# Patient Record
Sex: Male | Born: 1945 | Race: White | Hispanic: No | Marital: Married | Smoking: Former smoker
Health system: Southern US, Community
[De-identification: ages and names within clinical notes are randomized; demographics above are authoritative.]

## PROBLEM LIST (undated history)

## (undated) DIAGNOSIS — Z87442 Personal history of urinary calculi: Secondary | ICD-10-CM

## (undated) DIAGNOSIS — R079 Chest pain, unspecified: Secondary | ICD-10-CM

## (undated) DIAGNOSIS — G459 Transient cerebral ischemic attack, unspecified: Secondary | ICD-10-CM

## (undated) DIAGNOSIS — I6529 Occlusion and stenosis of unspecified carotid artery: Secondary | ICD-10-CM

## (undated) DIAGNOSIS — R0602 Shortness of breath: Secondary | ICD-10-CM

## (undated) DIAGNOSIS — Z8679 Personal history of other diseases of the circulatory system: Secondary | ICD-10-CM

## (undated) DIAGNOSIS — I25810 Atherosclerosis of coronary artery bypass graft(s) without angina pectoris: Secondary | ICD-10-CM

## (undated) DIAGNOSIS — K219 Gastro-esophageal reflux disease without esophagitis: Secondary | ICD-10-CM

## (undated) DIAGNOSIS — M1990 Unspecified osteoarthritis, unspecified site: Secondary | ICD-10-CM

## (undated) DIAGNOSIS — H9190 Unspecified hearing loss, unspecified ear: Secondary | ICD-10-CM

## (undated) DIAGNOSIS — N189 Chronic kidney disease, unspecified: Secondary | ICD-10-CM

## (undated) DIAGNOSIS — G5600 Carpal tunnel syndrome, unspecified upper limb: Secondary | ICD-10-CM

## (undated) DIAGNOSIS — I2510 Atherosclerotic heart disease of native coronary artery without angina pectoris: Secondary | ICD-10-CM

## (undated) DIAGNOSIS — E785 Hyperlipidemia, unspecified: Secondary | ICD-10-CM

## (undated) DIAGNOSIS — I10 Essential (primary) hypertension: Secondary | ICD-10-CM

## (undated) DIAGNOSIS — I639 Cerebral infarction, unspecified: Secondary | ICD-10-CM

## (undated) HISTORY — DX: Atherosclerosis of coronary artery bypass graft(s) without angina pectoris: I25.810

## (undated) HISTORY — DX: Carpal tunnel syndrome, unspecified upper limb: G56.00

## (undated) HISTORY — DX: Transient cerebral ischemic attack, unspecified: G45.9

## (undated) HISTORY — DX: Hyperlipidemia, unspecified: E78.5

## (undated) HISTORY — DX: Occlusion and stenosis of unspecified carotid artery: I65.29

## (undated) HISTORY — PX: CORONARY ARTERY BYPASS GRAFT: SHX141

## (undated) HISTORY — PX: SHOULDER ARTHROSCOPY W/ ROTATOR CUFF REPAIR: SHX2400

## (undated) HISTORY — PX: CARPAL TUNNEL RELEASE: SHX101

## (undated) HISTORY — PX: NECK SURGERY: SHX720

## (undated) HISTORY — DX: Personal history of other diseases of the circulatory system: Z86.79

## (undated) HISTORY — PX: BACK SURGERY: SHX140

## (undated) HISTORY — PX: CARDIAC CATHETERIZATION: SHX172

## (undated) HISTORY — DX: Chest pain, unspecified: R07.9

---

## 1999-02-11 ENCOUNTER — Encounter: Payer: Self-pay | Admitting: Cardiology

## 1999-02-11 ENCOUNTER — Inpatient Hospital Stay (HOSPITAL_COMMUNITY): Admission: AD | Admit: 1999-02-11 | Discharge: 1999-02-16 | Payer: Self-pay | Admitting: Cardiology

## 1999-02-12 ENCOUNTER — Encounter: Payer: Self-pay | Admitting: Cardiology

## 1999-02-13 ENCOUNTER — Encounter: Payer: Self-pay | Admitting: Cardiology

## 1999-02-14 ENCOUNTER — Encounter: Payer: Self-pay | Admitting: Thoracic Surgery (Cardiothoracic Vascular Surgery)

## 1999-10-17 ENCOUNTER — Encounter: Payer: Self-pay | Admitting: Neurology

## 1999-10-17 ENCOUNTER — Ambulatory Visit (HOSPITAL_COMMUNITY): Admission: RE | Admit: 1999-10-17 | Discharge: 1999-10-17 | Payer: Self-pay | Admitting: Neurology

## 2001-01-01 ENCOUNTER — Ambulatory Visit (HOSPITAL_COMMUNITY): Admission: RE | Admit: 2001-01-01 | Discharge: 2001-01-01 | Payer: Self-pay | Admitting: General Surgery

## 2001-11-27 ENCOUNTER — Encounter: Payer: Self-pay | Admitting: Family Medicine

## 2001-11-27 ENCOUNTER — Ambulatory Visit (HOSPITAL_COMMUNITY): Admission: RE | Admit: 2001-11-27 | Discharge: 2001-11-27 | Payer: Self-pay | Admitting: Family Medicine

## 2002-05-03 ENCOUNTER — Ambulatory Visit (HOSPITAL_COMMUNITY): Admission: RE | Admit: 2002-05-03 | Discharge: 2002-05-03 | Payer: Self-pay | Admitting: Cardiology

## 2002-05-03 ENCOUNTER — Encounter: Payer: Self-pay | Admitting: Cardiology

## 2002-05-03 ENCOUNTER — Encounter (HOSPITAL_COMMUNITY): Admission: RE | Admit: 2002-05-03 | Discharge: 2002-06-02 | Payer: Self-pay | Admitting: Cardiology

## 2002-12-16 ENCOUNTER — Encounter: Payer: Self-pay | Admitting: Family Medicine

## 2002-12-16 ENCOUNTER — Ambulatory Visit (HOSPITAL_COMMUNITY): Admission: RE | Admit: 2002-12-16 | Discharge: 2002-12-16 | Payer: Self-pay | Admitting: Family Medicine

## 2003-05-26 ENCOUNTER — Ambulatory Visit (HOSPITAL_COMMUNITY): Admission: RE | Admit: 2003-05-26 | Discharge: 2003-05-26 | Payer: Self-pay | Admitting: Cardiology

## 2004-12-07 ENCOUNTER — Ambulatory Visit (HOSPITAL_COMMUNITY): Admission: RE | Admit: 2004-12-07 | Discharge: 2004-12-07 | Payer: Self-pay | Admitting: Family Medicine

## 2005-03-22 ENCOUNTER — Ambulatory Visit: Payer: Self-pay | Admitting: Cardiology

## 2005-04-08 ENCOUNTER — Ambulatory Visit: Payer: Self-pay

## 2005-12-13 ENCOUNTER — Ambulatory Visit (HOSPITAL_COMMUNITY): Admission: RE | Admit: 2005-12-13 | Discharge: 2005-12-13 | Payer: Self-pay | Admitting: Family Medicine

## 2006-02-04 ENCOUNTER — Ambulatory Visit: Payer: Self-pay | Admitting: Internal Medicine

## 2006-02-04 ENCOUNTER — Observation Stay (HOSPITAL_COMMUNITY): Admission: EM | Admit: 2006-02-04 | Discharge: 2006-02-06 | Payer: Self-pay | Admitting: Emergency Medicine

## 2006-02-16 ENCOUNTER — Ambulatory Visit: Payer: Self-pay | Admitting: Cardiology

## 2006-03-10 ENCOUNTER — Ambulatory Visit: Payer: Self-pay

## 2006-03-20 ENCOUNTER — Ambulatory Visit: Payer: Self-pay | Admitting: Cardiology

## 2006-03-29 ENCOUNTER — Ambulatory Visit: Payer: Self-pay

## 2007-02-20 ENCOUNTER — Ambulatory Visit (HOSPITAL_COMMUNITY): Admission: RE | Admit: 2007-02-20 | Discharge: 2007-02-20 | Payer: Self-pay | Admitting: General Surgery

## 2007-03-06 ENCOUNTER — Ambulatory Visit: Payer: Self-pay | Admitting: Cardiology

## 2008-01-08 ENCOUNTER — Ambulatory Visit (HOSPITAL_COMMUNITY): Admission: RE | Admit: 2008-01-08 | Discharge: 2008-01-08 | Payer: Self-pay | Admitting: Family Medicine

## 2008-02-13 ENCOUNTER — Ambulatory Visit: Payer: Self-pay | Admitting: Orthopedic Surgery

## 2008-02-13 DIAGNOSIS — G5600 Carpal tunnel syndrome, unspecified upper limb: Secondary | ICD-10-CM

## 2008-02-19 ENCOUNTER — Ambulatory Visit: Payer: Self-pay | Admitting: Cardiology

## 2008-02-21 ENCOUNTER — Ambulatory Visit: Payer: Self-pay

## 2008-02-21 ENCOUNTER — Encounter: Payer: Self-pay | Admitting: Orthopedic Surgery

## 2008-02-28 ENCOUNTER — Telehealth: Payer: Self-pay | Admitting: Orthopedic Surgery

## 2008-02-29 ENCOUNTER — Encounter: Payer: Self-pay | Admitting: Orthopedic Surgery

## 2008-03-04 ENCOUNTER — Encounter: Payer: Self-pay | Admitting: Orthopedic Surgery

## 2008-03-11 ENCOUNTER — Ambulatory Visit: Payer: Self-pay | Admitting: Orthopedic Surgery

## 2008-03-14 ENCOUNTER — Encounter: Payer: Self-pay | Admitting: Orthopedic Surgery

## 2008-03-21 ENCOUNTER — Ambulatory Visit: Payer: Self-pay | Admitting: Orthopedic Surgery

## 2008-03-21 ENCOUNTER — Ambulatory Visit (HOSPITAL_COMMUNITY): Admission: RE | Admit: 2008-03-21 | Discharge: 2008-03-21 | Payer: Self-pay | Admitting: Orthopedic Surgery

## 2008-03-25 ENCOUNTER — Ambulatory Visit: Payer: Self-pay | Admitting: Orthopedic Surgery

## 2008-04-02 ENCOUNTER — Ambulatory Visit: Payer: Self-pay | Admitting: Orthopedic Surgery

## 2008-04-16 ENCOUNTER — Ambulatory Visit: Payer: Self-pay | Admitting: Orthopedic Surgery

## 2008-12-26 ENCOUNTER — Encounter (INDEPENDENT_AMBULATORY_CARE_PROVIDER_SITE_OTHER): Payer: Self-pay | Admitting: *Deleted

## 2009-02-19 DIAGNOSIS — E785 Hyperlipidemia, unspecified: Secondary | ICD-10-CM

## 2009-02-19 DIAGNOSIS — I25810 Atherosclerosis of coronary artery bypass graft(s) without angina pectoris: Secondary | ICD-10-CM | POA: Insufficient documentation

## 2009-02-19 DIAGNOSIS — R079 Chest pain, unspecified: Secondary | ICD-10-CM | POA: Insufficient documentation

## 2009-02-19 DIAGNOSIS — Z8679 Personal history of other diseases of the circulatory system: Secondary | ICD-10-CM | POA: Insufficient documentation

## 2009-02-25 ENCOUNTER — Ambulatory Visit: Payer: Self-pay | Admitting: Cardiology

## 2009-02-25 ENCOUNTER — Ambulatory Visit: Payer: Self-pay

## 2009-08-25 ENCOUNTER — Encounter: Payer: Self-pay | Admitting: Cardiology

## 2009-08-26 ENCOUNTER — Ambulatory Visit: Payer: Self-pay

## 2009-08-26 ENCOUNTER — Encounter: Payer: Self-pay | Admitting: Cardiology

## 2009-12-07 ENCOUNTER — Ambulatory Visit (HOSPITAL_COMMUNITY): Admission: RE | Admit: 2009-12-07 | Discharge: 2009-12-07 | Payer: Self-pay | Admitting: Family Medicine

## 2010-02-15 ENCOUNTER — Ambulatory Visit: Payer: Self-pay | Admitting: Cardiology

## 2010-02-16 ENCOUNTER — Telehealth (INDEPENDENT_AMBULATORY_CARE_PROVIDER_SITE_OTHER): Payer: Self-pay | Admitting: *Deleted

## 2010-02-17 ENCOUNTER — Encounter (HOSPITAL_COMMUNITY): Admission: RE | Admit: 2010-02-17 | Discharge: 2010-04-30 | Payer: Self-pay | Admitting: Cardiology

## 2010-02-17 ENCOUNTER — Encounter: Payer: Self-pay | Admitting: Internal Medicine

## 2010-02-17 ENCOUNTER — Ambulatory Visit: Payer: Self-pay

## 2010-02-17 ENCOUNTER — Ambulatory Visit: Payer: Self-pay | Admitting: Internal Medicine

## 2010-06-29 NOTE — Progress Notes (Signed)
Summary: nuc pre procedure  Phone Note Outgoing Call Call back at Home Phone [phone]   Call placed by: Cathlyn Parsons RN,  February 16, 2010 4:00 PM Call placed to: Patient Reason for Call: Confirm/change Appt Summary of Call: Reviewed information on Myoview Information Sheet (see scanned document for further details).  Spoke with patients wife.      Nuclear Med Background Indications for Stress Test: Evaluation for Ischemia, Graft Patency   History: Asthma, CABG, COPD, Heart Catheterization, Myocardial Infarction  History Comments: 00 CABG x5 07 Cath EF:nl,LVF:nl,patent grafts 09 MPS:NL and no isch with EF of 53%  Symptoms: Chest Pain, Chest Tightness with Exertion, SOB    Nuclear Pre-Procedure Cardiac Risk Factors: Carotid Disease, Hypertension, Lipids, PVD, Smoker, TIA Height (in): 65

## 2010-06-29 NOTE — Miscellaneous (Signed)
Summary: Orders Update  Clinical Lists Changes  Orders: Added new Test order of Carotid Duplex (Carotid Duplex) - Signed 

## 2010-06-29 NOTE — Assessment & Plan Note (Signed)
Summary: Cardiology Nuclear Testing  Nuclear Med Background Indications for Stress Test: Evaluation for Ischemia, Graft Patency   History: Asthma, CABG, COPD, Heart Catheterization, Myocardial Infarction  History Comments: 00 CABG x5 07 Cath EF:nl,LVF:nl,patent grafts 09 MPS:NL and no isch with EF of 53%  Symptoms: Chest Pain, Chest Tightness with Exertion, DOE, Near Syncope, Rapid HR, SOB  Symptoms Comments: Last CP last week.   Nuclear Pre-Procedure Cardiac Risk Factors: Carotid Disease, Family History - CAD, Hypertension, Lipids, PVD, Smoker, TIA Caffeine/Decaff Intake: None NPO After: 8:00 AM Lungs: Diminished breath sounds, (-) wheezing IV 0.9% NS with Angio Cath: 20g     IV Site: R Antecubital IV Started by: Irean Hong, RN Chest Size (in) 38     Height (in): 65 Weight (lb): 122 BMI: 20.38  Nuclear Med Study 1 or 2 day study:  1 day     Stress Test Type:  Eugenie Birks Reading MD:  Arvilla Meres, MD     Referring MD:  T. Wall Resting Radionuclide:  Technetium 33m Tetrofosmin     Resting Radionuclide Dose:  11 mCi  Stress Radionuclide:  Technetium 63m Tetrofosmin     Stress Radionuclide Dose:  33 mCi   Stress Protocol      Max HR:  86 bpm     Predicted Max HR:  157 bpm  Max Systolic BP: 158 mm Hg     Percent Max HR:  54.78 %Rate Pressure Product:  16109  Lexiscan: 0.4 mg   Stress Test Technologist:  Irean Hong,  RN     Nuclear Technologist:  Domenic Polite, CNMT  Rest Procedure  Myocardial perfusion imaging was performed at rest 45 minutes following the intravenous administration of Technetium 77m Tetrofosmin.  Stress Procedure  The patient received IV Lexiscan 0.4 mg over 15-seconds.  Technetium 63m Tetrofosmin injected at 30-seconds.  There were nonspecific T-wave changes with lexiscan, rare PVC.  Quantitative spect images were obtained after a 45 minute delay.  QPS Raw Data Images:  Normal; no motion artifact; normal heart/lung ratio. Stress Images:   Decreased uptake in the septum. Apical thinning Rest Images:  Decreased uptake in the septum. 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 Apical thinning. No ischemia. Transient Ischemic Dilatation:  .99  (Normal <1.22)  Lung/Heart Ratio:  .32  (Normal <0.45)  Quantitative Gated Spect Images QGS EDV:  104 ml QGS ESV:  47 ml QGS EF:  55 % QGS cine images:  Septal hypokinesis/dyskinesis due to previous infarct vs previous CABG  Findings Low risk nuclear study      Overall Impression  Exercise Capacity: Lexiscan with no exercise. ECG Impression: No significant ST segment change suggestive of ischemia with Lexiscan. Overall Impression: Low risk stress nuclear study. Overall Impression Comments: Fixed septal defect suggestive of previous infarct (vs post-CABG changes). Apical thinning. No ischemia.  Appended Document: Cardiology Nuclear Testing stable, no change in treatment  Appended Document: Cardiology Nuclear Testing Pt is aware of test results. Mylo Red RN

## 2010-06-29 NOTE — Assessment & Plan Note (Signed)
Summary: per check out/sg   Visit Type:  1 yr f/u Primary Devin [name]:  Devin [name]  CC:  chest pain....sob....denies any edema.  History of Present Illness: [name] returns today for evaluation and management of his coronary artery disease and nonobstructive carotid disease.  Bypass surgery was about 11 years ago. His last catheterization was in 2007 which showed patent grafts and small vessel disease. He has good left ventricular systolic function.  Recently, he had increased shortness of breath and chest tightness at work. He works in [employer]. He has not taken a nitroglycerin. He says he needs this renewed.  Current Medications (verified): 1)  Plavix 75 Mg Tabs (Clopidogrel Bisulfate) .Marland Kitchen.. 1 Tab Once Daily 2)  Xalatan 0.005 % Soln (Latanoprost) .... Use As Directed 3)  Simvastatin 40 Mg Tabs (Simvastatin) .Marland Kitchen.. 1 Tab At Bedtime 4)  Aspirin 81 Mg Tbec (Aspirin) .... Take One Tablet By Mouth Daily 5)  Prilosec Otc 20 Mg Tbec (Omeprazole Magnesium) .Marland Kitchen.. 1 Tab Once Daily 6)  Cosopt 22.3-6.8 Mg/ml Soln (Dorzolamide Hcl-Timolol Mal) .Marland Kitchen.. 1 Gtt As Directed  Allergies: 1)  ! * Uncoated Aspirin  Clinical Reports Reviewed:  Cardiac Cath:  02/06/2006: Cardiac Cath Findings:  A left ventriculogram was performed in the 30 degree RAO projection.  It  demonstrated normal left ventricular function with a left ventricular  ejection fraction estimated at 60%.   ASSESSMENT:  1. Severe native three vessel coronary artery disease.  2. 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  3. Small distal coronary arteries.  4. Normal left ventricular function and normal left ventricular filling      pressures.   Findings discussed with Dr. Daleen Squibb.  Will plan on continued medical therapy.  Will start him on a Statin medication in the setting of his coronary artery  disease.  Will plan on discharging him home later today after his  observation period is complete.   Veverly Fells. Excell Seltzer, MD  Electronically Signed  02/11/1999: Cardiac Cath Findings:  Impression: 1. Normal left ventricular systolic function. 2. Significant 3-vessel coronary artery disease as described. The most crtical disease is in the left circumflex and right coronary system. Neither of these appear favorable for percutaneous intervention due to the diffuse nature of the disease.  Recommendation: Evaluation for CABG.  Daisey Must, MD  Carotid Doppler:  08/26/2009:  Impressions: Mild bilateral carotid artery disease, stable over serial exams. 40-59% RICA stenosis 0-39% LICA stenosis.  Tonny Bollman, MD  02/25/2009:  Impressions: Stable, moderate carotid artery disease on the right. Stable, mild carotid artery disease on the left. 60-79% RICA stenosis 0-39% LICA stenosis.  Tonny Bollman, MD  02/21/2008:  Impressions: Mild plaque in the carotid bulbs. Slight progression of RICA stenosis, now in the 60-79% range, low end of scale. 0-39% LICA stenosis.  Tonny Bollman, MD  03/29/2006:  Impressions: Stable bilateral carotid disease. 40-59% RICA stenosis 0-39% LICA stenosis.  Randa Evens, MD  05/26/2003:   Clinical Data:  Right carotid stenosis, follow-up.   ULTRASOUND CAROTID DUPLEX BILATERAL   Comparison 05/03/02.   Plaque formation is identified at the proximal right internal carotid   artery, less severe at the proximal internal and external carotid   arteries on the left and in the right common carotid artery.  Mild   intimal thickening, right common carotid artery.  Minimal tortuosity   of the carotid vessels.  On color Doppler imaging, mild turbulence of   flow is identified in  proximal right internal carotid artery.  Peak   systolic velocities and end diastolic velocities are as follows   (cm/second):                                                CCA   ICA                      ECA                    ICA/CCA RATIO   ICA EDV   RIGHT                               106                       147   108                       1.39                                    41   LEFT                                  74                         74   196                       1.00   Antegrade flow present bilateral vertebral arteries.   IMPRESSION   Scattered plaque formation bilaterally, most prominent at proximal   right internal carotid artery.  Elevated peak systolic velocity and   end diastolic velocity in the proximal right internal carotid artery   correspond to 50 to 69% luminal diameter stenosis.  Peak velocity   obtained on the current study is unchanged since the previous study.    Read By:  Lollie Marrow,  M.D.   Released By:  Lollie Marrow,  M.D.   Review of Systems       negative other than history of present illness  Vital Signs:  Patient profile:   65 year old male Height:      65 inches Weight:      120 pounds BMI:     20.04 Pulse rate:   49 / minute Pulse rhythm:   irregular BP sitting:   126 / 80  (left arm) Cuff size:   large  Vitals Entered By: Danielle Rankin, CMA (February 15, 2010 10:30 AM)  Physical Exam  General:  thin, no acute distress Head:  normocephalic and atraumatic Eyes:  PERRLA/EOM intact; conjunctiva and lids normal. Neck:  Neck supple, no JVD. No masses, thyromegaly or abnormal cervical nodes. Chest Wall:  no deformities or breast masses noted Lungs:  decreased breath sounds throughout Heart:  PMI nondisplaced, normal S1-S2, regular rate and rhythm, no gallop, carotids are full without significant bruit Abdomen:  soft, positive bowel sounds, no midline bruit Msk:  decreased ROM.   Pulses:  pulses normal in all 4  extremities Extremities:  No clubbing or cyanosis. Neurologic:  Alert and oriented x 3. Skin:  Intact without lesions or rashes. Psych:  Normal affect.   EKG  Procedure date:  02/15/2010  Findings:      marked sinus bradycardia, incomplete right bundle, no change  Impression &  Recommendations:  Problem # 1:  CAD, ARTERY BYPASS GRAFT (ICD-414.04)  His dyspnea and chest pain at work are concerning for coronary ischemia. Risk stratify with a nuclear stress study. His updated medication list for this problem includes:    Plavix 75 Mg Tabs (Clopidogrel bisulfate) .Marland Kitchen... 1 tab once daily    Aspirin 81 Mg Tbec (Aspirin) .Marland Kitchen... Take one tablet by mouth daily    Nitrostat 0.4 Mg Subl (Nitroglycerin) .Marland Kitchen... 1 tablet under tongue at onset of chest pain; you may repeat every 5 minutes for up to 3 doses.  Orders: EKG w/ Interpretation (93000) Nuclear Stress Test (Nuc Stress Test)  Problem # 2:  TRANSIENT ISCHEMIC ATTACKS, HX OF (ICD-V12.50) Assessment: Unchanged  Problem # 3:  CAROTID ARTERY DISEASE/ NONOBSTRUCTIVE (ICD-433.10) Assessment: Unchanged  His updated medication list for this problem includes:    Plavix 75 Mg Tabs (Clopidogrel bisulfate) .Marland Kitchen... 1 tab once daily    Aspirin 81 Mg Tbec (Aspirin) .Marland Kitchen... Take one tablet by mouth daily  Problem # 4:  HYPERLIPIDEMIA (ICD-272.4)  His updated medication list for this problem includes:    Simvastatin 40 Mg Tabs (Simvastatin) .Marland Kitchen... 1 tab at bedtime  Patient Instructions: 1)  Your physician recommends that you schedule a follow-up appointment in: 1 year with Dr. Daleen Squibb 2)  Your physician recommends that you continue on your current medications as directed. Please refer to the Current Medication list given to you today. 3)  Your physician has requested that you have a lexiscan myoview.  For further information please visit [email or web address]  Please follow instruction sheet, as given.  SCHEDULE THIS WEEK IF POSSIBLE. Prescriptions: NITROSTAT 0.4 MG SUBL (NITROGLYCERIN) 1 tablet under tongue at onset of chest pain; you may repeat every 5 minutes for up to 3 doses.  #25 x 11   Entered by:   Lisabeth Devoid RN   Authorized by:   Gaylord Shih, MD, [facility]   Signed by:   Lisabeth Devoid RN on 02/15/2010   Method used:   Electronically to         [facility]* (retail)       [address]       [place], [place]  [address]       Ph: [phone]       Fax: [phone]   RxID:   [id number]

## 2010-09-13 ENCOUNTER — Encounter: Payer: Self-pay | Admitting: Orthopedic Surgery

## 2010-09-28 ENCOUNTER — Encounter: Payer: Self-pay | Admitting: Orthopedic Surgery

## 2010-09-28 ENCOUNTER — Ambulatory Visit (HOSPITAL_COMMUNITY)
Admission: RE | Admit: 2010-09-28 | Discharge: 2010-09-28 | Disposition: A | Discharge status: Home / Self Care | Payer: BC Managed Care – PPO | Source: Ambulatory Visit | Attending: Orthopedic Surgery | Admitting: Orthopedic Surgery

## 2010-09-28 ENCOUNTER — Other Ambulatory Visit: Payer: Self-pay | Admitting: Orthopedic Surgery

## 2010-09-28 ENCOUNTER — Ambulatory Visit (INDEPENDENT_AMBULATORY_CARE_PROVIDER_SITE_OTHER): Payer: BC Managed Care – PPO | Admitting: Orthopedic Surgery

## 2010-09-28 VITALS — Resp 18 | Ht 64.0 in | Wt 126.0 lb

## 2010-09-28 DIAGNOSIS — M67919 Unspecified disorder of synovium and tendon, unspecified shoulder: Secondary | ICD-10-CM

## 2010-09-28 DIAGNOSIS — M899 Disorder of bone, unspecified: Secondary | ICD-10-CM | POA: Insufficient documentation

## 2010-09-28 DIAGNOSIS — M719 Bursopathy, unspecified: Secondary | ICD-10-CM

## 2010-09-28 DIAGNOSIS — M25519 Pain in unspecified shoulder: Secondary | ICD-10-CM | POA: Insufficient documentation

## 2010-09-28 DIAGNOSIS — M25511 Pain in right shoulder: Secondary | ICD-10-CM

## 2010-09-28 DIAGNOSIS — M7550 Bursitis of unspecified shoulder: Secondary | ICD-10-CM

## 2010-09-28 MED ORDER — TRAMADOL-ACETAMINOPHEN 37.5-325 MG PO TABS: 1.0000 | ORAL_TABLET | ORAL | Status: AC | PRN

## 2010-09-28 MED ORDER — METHYLPREDNISOLONE ACETATE 40 MG/ML IJ SUSP: 40.0000 mg | Freq: Once | INTRAMUSCULAR | Status: DC

## 2010-09-28 NOTE — Progress Notes (Signed)
Chief complaint RIGHT shoulder pain  65 year old male complains of sharp throbbing RIGHT shoulder pain of moderate severity 6/10 for the last 2-1/2 months with no history of trauma.  His pain came on gradually and it's worse when he is using his arm overhead and eases off when he is not using his arm except it does also hurt at night.  Review of systems musculoskeletal as stated neurologic normal.  History medical problems he listed none but he takes Plavix a coated aspirin and another medication which is listed.  He had carpal tunnel releases which I did he also had cervical disc surgery and he has heart disease because he had a bypass.  He works at a [employer] and he frequently uses his arms overhead.  Vital signs are stable recorded data.  General appearance he is a small framed.  He is going to x3.  His mood and affect are normal.  He ambulates normally.    RIGHT shoulder exam inspection tenderness over the subacromial space an anterolateral deltoid.  He has restricted range of motion in flexion abduction and external rotation.  However the shoulder remained stable.  He has mild weakness in the supraspinatus tendon.  Skin is normal.  Pulses excellent temperature is normal.  There is no lymph node enlargement and sensation was normal and the RIGHT upper extremity.  He did have a positive impingement sign.

## 2010-09-28 NOTE — Patient Instructions (Signed)
Going to [facility] for [employer] will call the result to you   You have received a steroid shot. 15% of patients experience increased pain at the injection site with in the next 24 hours. This is best treated with ice and tylenol extra strength 2 tabs every 8 hours. If you are still having pain please call the office.

## 2010-10-12 NOTE — H&P (Signed)
NAME:  Devin [name], Devin [name]            ACCOUNT NO.:  [id number]   MEDICAL RECORD NO.:  [id number]           PATIENT TYPE:  AMB   LOCATION:  DAY                           FACILITY:  [facility]   PHYSICIAN:  Mark A. Jenkins, M.D.  DATE OF BIRTH:  08/15/1945   DATE OF ADMISSION:  DATE OF DISCHARGE:  LH                              HISTORY & PHYSICAL   CHIEF COMPLAINT:  Hematochezia.   HISTORY OF PRESENT ILLNESS:  The patient is a 65-year-old white male who  is referred for endoscopic evaluation. He needs a colonoscopy for  hematochezia.  He was noted on hemoccult card testing to be positive.  No abdominal pain, weight loss, nausea, vomiting, diarrhea,  constipation, or melena had been noted.  He last had a colonoscopy in  2002 which was unremarkable.  There is no family history of colon  carcinoma.   PAST MEDICAL HISTORY:  Coronary artery disease, high cholesterol levels.   PAST SURGICAL HISTORY:  CABG, back surgery.   CURRENT MEDICATIONS:  Plavix, Prilosec.   ALLERGIES:  Regular ASPIRIN.   REVIEW OF SYSTEMS:  The patient smokes a pack of cigarettes a day.  He  denies any alcohol use.  He denies any recent chest pain, shortness of  breath, leg swelling, CVA, or diabetes mellitus.   PHYSICAL EXAMINATION:  The patient is a well-developed, well-nourished,  white male in no acute distress.  LUNGS:  Clear to auscultation with equal breath sounds bilaterally.  HEART:  Examination reveals a regular rate and rhythm without S3, S4, or  murmurs.  ABDOMEN:  Soft, nontender, nondistended.  No hepatosplenomegaly or  masses are noted.  RECTAL:  Examination was deferred to the procedure.   IMPRESSION:  Hematochezia.   PLAN:  The patient is scheduled for a colonoscopy on February 20, 2007.  The risks and benefits of the procedure including bleeding and  perforation were fully explained to the patient, who gave informed  consent. He was told to hold his Plavix 5 days prior to the procedure.      Mark A. Jenkins, M.D.  Electronically Signed     MAJ/MEDQ  D:  01/23/2007  T:  01/24/2007  Job:  [id number]   cc:   Angus G. McInnis, MD  Fax: [phone] 

## 2010-10-12 NOTE — Assessment & Plan Note (Signed)
[facility]                            CARDIOLOGY OFFICE NOTE   NAME:Devin, [name]                   MRN:          [id number]  DATE:03/06/2007                            DOB:          06-28-1945    Mr. Devin [name] returns today for further management of the following  issues:   1. Coronary artery disease.  Catheterization in September 2007 showed      all grafts to be patent.  He has normal left ventricular function.  2. Nonobstructive carotid disease.  He has had a history of TIA's and      is on Plavix and aspirin.  He has small vessel disease.  Carotid      Dopplers October 7 were stable.  He has integrated flow in both      vertebrals.  3. Hyperlipidemia.  Dr. [name] recently checked blood work.  He was      at goal last year.   MEDICATIONS:  1. Prilosec 20 mg a day.  2. Plavix 75 mg a day.  3. Simvastatin 40 mg q.h.s.  4. Aspirin 81 mg a day.   PHYSICAL EXAMINATION:  GENERAL:  He is in no acute distress.  VITAL SIGNS:  Blood pressure 124/60, pulse 56 and regular.  His EKG is  normal except for some left atrial enlargement and RSR prime in V1 and  V2.  This is stable.  Weight 126.  HEENT:  Unchanged.  NECK:  Carotid upstrokes were equal bilaterally with a soft bruit on the  right.  CHEST:  Sternum is intact.  HEART:  He has a nondisplaced PMI.  He has a widely split S2.  LUNGS:  Reveal decreased breath sounds throughout.  NECK:  Shows no lymphadenopathy, no thyromegaly.  ABDOMEN:  Soft, good bowel sounds.  There is no midline bruit.  There is  no tenderness.  EXTREMITIES:  There is no cyanosis, clubbing or edema.  Pulses were  present.  NEUROLOGICAL:  Intact.   ASSESSMENT/PLAN:  Devin [name] is stable from our standpoint.  I have  made no change in his program. Will see him back in a year.     Thomas C. Daleen Squibb, MD, [facility]  Electronically Signed    TCW/MedQ  DD: 03/06/2007  DT: 03/06/2007  Job #: [id number]

## 2010-10-12 NOTE — H&P (Signed)
NAMEMIRL, Devin [name]            ACCOUNT NO.:  [id number]   MEDICAL RECORD NO.:  [id number]           PATIENT TYPE:  AMB   LOCATION:  DAY                           FACILITY:  [facility]   PHYSICIAN:  Dalia Heading, M.D.  DATE OF BIRTH:  1945/09/25   DATE OF ADMISSION:  DATE OF DISCHARGE:  LH                              HISTORY & PHYSICAL   CHIEF COMPLAINT:  Hematochezia.   HISTORY OF PRESENT ILLNESS:  The patient is a 65 year old white male who  is referred for endoscopic evaluation. He needs a colonoscopy for  hematochezia.  He was noted on hemoccult card testing to be positive.  No abdominal pain, weight loss, nausea, vomiting, diarrhea,  constipation, or melena had been noted.  He last had a colonoscopy in  2002 which was unremarkable.  There is no family history of colon  carcinoma.   PAST MEDICAL HISTORY:  Coronary artery disease, high cholesterol levels.   PAST SURGICAL HISTORY:  CABG, back surgery.   CURRENT MEDICATIONS:  Plavix, Prilosec.   ALLERGIES:  Regular ASPIRIN.   REVIEW OF SYSTEMS:  The patient smokes a pack of cigarettes a day.  He  denies any alcohol use.  He denies any recent chest pain, shortness of  breath, leg swelling, CVA, or diabetes mellitus.   PHYSICAL EXAMINATION:  The patient is a well-developed, well-nourished,  white male in no acute distress.  LUNGS:  Clear to auscultation with equal breath sounds bilaterally.  HEART:  Examination reveals a regular rate and rhythm without S3, S4, or  murmurs.  ABDOMEN:  Soft, nontender, nondistended.  No hepatosplenomegaly or  masses are noted.  RECTAL:  Examination was deferred to the procedure.   IMPRESSION:  Hematochezia.   PLAN:  The patient is scheduled for a colonoscopy on February 20, 2007.  The risks and benefits of the procedure including bleeding and  perforation were fully explained to the patient, who gave informed  consent. He was told to hold his Plavix 5 days prior to the procedure.      Dalia Heading, M.D.  Electronically Signed     MAJ/MEDQ  D:  01/23/2007  T:  01/24/2007  Job:  [id number]   cc:   Angus G. [name], MD  Fax: [phone]

## 2010-10-12 NOTE — H&P (Signed)
NAMEPJ, ZEHNER            ACCOUNT NO.:  [id number]   MEDICAL RECORD NO.:  [id number]           PATIENT TYPE:  AMB   LOCATION:  DAY                           FACILITY:  [facility]   PHYSICIAN:  Dalia Heading, M.D.  DATE OF BIRTH:  Jan 28, 1946   DATE OF ADMISSION:  DATE OF DISCHARGE:  LH                              HISTORY & PHYSICAL   CHIEF COMPLAINT:  Hematochezia.   HISTORY OF PRESENT ILLNESS:  The patient is a 65 year old white male who  is referred for endoscopic evaluation. He needs a colonoscopy for  hematochezia.  He was noted on hemoccult card testing to be positive.  No abdominal pain, weight loss, nausea, vomiting, diarrhea,  constipation, or melena had been noted.  He last had a colonoscopy in  2002 which was unremarkable.  There is no family history of colon  carcinoma.   PAST MEDICAL HISTORY:  Coronary artery disease, high cholesterol levels.   PAST SURGICAL HISTORY:  CABG, back surgery.   CURRENT MEDICATIONS:  Plavix, Prilosec.   ALLERGIES:  Regular ASPIRIN.   REVIEW OF SYSTEMS:  The patient smokes a pack of cigarettes a day.  He  denies any alcohol use.  He denies any recent chest pain, shortness of  breath, leg swelling, CVA, or diabetes mellitus.   PHYSICAL EXAMINATION:  The patient is a well-developed, well-nourished,  white male in no acute distress.  LUNGS:  Clear to auscultation with equal breath sounds bilaterally.  HEART:  Examination reveals a regular rate and rhythm without S3, S4, or  murmurs.  ABDOMEN:  Soft, nontender, nondistended.  No hepatosplenomegaly or  masses are noted.  RECTAL:  Examination was deferred to the procedure.   IMPRESSION:  Hematochezia.   PLAN:  The patient is scheduled for a colonoscopy on February 20, 2007.  The risks and benefits of the procedure including bleeding and  perforation were fully explained to the patient, who gave informed  consent. He was told to hold his Plavix 5 days prior to the procedure.      Dalia Heading, M.D.  Electronically Signed     MAJ/MEDQ  D:  01/23/2007  T:  01/24/2007  Job:  [id number]   cc:   Angus G. [name], MD  Fax: [phone]

## 2010-10-12 NOTE — Assessment & Plan Note (Signed)
[facility]                            CARDIOLOGY OFFICE NOTE   NAME:[name], Devin BELLEW                   MRN:          [id number]  DATE:02/19/2008                            DOB:          Aug 22, 1945    HISTORY OF PRESENT ILLNESS:  Devin [name] returns today for followup of  his coronary artery disease.  He has been under a lot of stress at work  with being afraid of the plant closing or losing more work time.  He has  also got a lot of family stresses going on.   He is 9 years out from bypass surgery.  He had a heart catheterization  in 2007, which showed all grafts to be patent.  He has normal left  ventricular function.   He has been having some chest pain at work when he is at work  physically.  He has not had this at rest.  He thinks a lot of his  tension is stress with activity.   He also has nonobstructive carotid disease.  He has had a history of  TIAs and he is on Plavix and aspirin.  He has had no further symptoms or  spells.  He has small vessel disease.  His carotid Dopplers stable in  October 2008.  He has antegrade flow in both vertebrals.   He is followed by Dr. Butch Penny in [place].  He has been  following his blood work and he says his hyperlipidemia is under good  control.   CURRENT MEDICATIONS:  1. Plavix 75 mg a day.  2. Xalatan eye drops.  3. Simvastatin 40 mg at bedtime.  4. Enteric-coated aspirin 81 mg a day.  5. Prilosec 20 mg a day.   PHYSICAL EXAMINATION:  VITAL SIGNS:  His blood pressure is 146/73 and  his heart rate is 59.  His electrocardiogram shows sinus bradycardia,  otherwise normal.  HEENT:  Normal.  NECK:  Carotids upstrokes were equal bilaterally without obvious bruits.  Thyroid is not enlarged.  Trachea is midline.  NECK:  Supple.  LUNGS:  Clear except decreased breath sounds throughout.  HEART:  Soft S1 and S2.  No gallop.  ABDOMEN:  Soft.  Good bowel sounds.  No midline bruit.  No  hepatomegaly.  EXTREMITIES:  No cyanosis, clubbing, or edema.  Pulses are intact.  NEURO:  Intact.   Devin [name] seems to be doing well except for his exertional chest  pain.  We will obtain an adenosine Myoview to rule out any high-risk  anatomy.  In addition, we will obtain carotid  Dopplers as well.  Assuming these are stable, hopefully will be, we will  see him back in a year.  If his stress test is abnormal, he will need a  heart catheterization.     Thomas C. Daleen Squibb, MD, [facility]  Electronically Signed    TCW/MedQ  DD: 02/19/2008  DT: 02/20/2008  Job #: [id number]   cc:   Angus G. [name], MD

## 2010-10-12 NOTE — Op Note (Signed)
Devin [name], Devin [name]            ACCOUNT NO.:  [id number]   MEDICAL RECORD NO.:  [id number]          PATIENT TYPE:  AMB   LOCATION:  DAY                           FACILITY:  [facility]   PHYSICIAN:  Vickki Hearing, M.D.DATE OF BIRTH:  11/19/1945   DATE OF PROCEDURE:  03/21/2008  DATE OF DISCHARGE:                               OPERATIVE REPORT   HISTORY:  This is a 65 year old male who has had carpal tunnel syndrome  for 9 years initially just would not have surgery, symptoms worsened  over the last 6 months to a year.  He was treated with Neurontin,  vitamin B6, and bracing.  He had a nerve conduction study, which showed  that he had bilateral carpal tunnel syndrome and we advised him to have  carpal tunnel release.  His test showed severe disease.   PREOPERATIVE DIAGNOSIS:  Bilateral carpal tunnel syndrome.   POSTOPERATIVE DIAGNOSIS:  Bilateral carpal tunnel syndrome.   PROCEDURE:  Open bilateral carpal tunnel release.   SURGEON:  Vickki Hearing, MD.   ASSISTANT:  No assistants.   ANESTHETIC:  General.   OPERATIVE FINDINGS:  Severe compression and discoloration of the median  nerve on both wrists and hand.   SPECIMENS:  No specimens.   ESTIMATED BLOOD LOSS:  ,  No blood loss.   COMPLICATIONS:  No complications.   COUNTS:  Correct.   The patient was stable at the end of procedure and went to PACU in good  condition.   POSTOPERATIVE PLAN:  Discharged on Lorcet Plus for pain 1 q.4 h. p.r.n.  #60 one refill and followup with [place] on Tuesday.   PROCEDURE DETAILS:  The patient was identified in the preop holding  area.  Both wrists were marked with my initials countersigned where he  marked.  History and physical were updated.  He was taken to the  operating room, given some Ancef, and general intubation was performed.   His right wrist was addressed first.   Sterile prep and drape was done initially with DuraPrep in sterile  draping technique.   Tourniquet  was elevated 150 mmHg where it stayed for 14 minutes.  An  incision was made in line with the radial border of the ring finger over  the carpal tunnel.  The subcutaneous tissue was divided.  Palmar fascia  was split sharply.  The distal portion or point of the carpal tunnel was  dissected with blunt dissection and then dissection was carried out  beneath the transverse carpal ligament and then the ligament was  released up through and passed the distal wrist crease.   The nerve was found to be discolored and compressed.  The wound was  irrigated and closed with 3-0 nylon suture and we injected 10 mL of  plain Marcaine on the radial sided incision, we applied sterile  dressing.  The tourniquet was released.  The fingers had good color.   We then repeated the procedure on the left side.  We did notice that  there was tortuous venous engorgement on the left, this was on the ulnar  side of the carpal tunnel,  and appeared to be having an effect on the  nerve as well.      Vickki Hearing, M.D.  Electronically Signed     SEH/MEDQ  D:  03/21/2008  T:  03/21/2008  Job:  [id number]

## 2010-10-15 NOTE — Discharge Summary (Signed)
Devin [name], SENFT            ACCOUNT NO.:  [id number]   MEDICAL RECORD NO.:  [id number]          PATIENT TYPE:  INP   LOCATION:  2028                         FACILITY:  [facility]   PHYSICIAN:  Duke Salvia, MD, FACCDATE OF BIRTH:  12-22-1945   DATE OF ADMISSION:  02/04/2006  DATE OF DISCHARGE:                                 DISCHARGE SUMMARY   ADDENDUM:  Please note that the attending physician is Dr. Hurman Horn.  He also interviewed and examined the patient.     ______________________________  Tereso Newcomer, PA-C    ______________________________  Duke Salvia, MD, [facility]    SW/MEDQ  D:  02/04/2006  T:  02/05/2006  Job:  [id number]

## 2010-10-15 NOTE — Assessment & Plan Note (Signed)
[facility]                              CARDIOLOGY OFFICE NOTE   NAME:Devin [name], Devin [name]                   MRN:          [id number]  DATE:03/20/2006                            DOB:          [birth year]    Devin [name] returns today for further management of the following issues:  1. Coronary artery disease. Recent catheterization in September showed      patent grafts x5. He has normal left ventricular function.  2. Non-obstructive carotid disease. He is due carotid Dopplers, last being      November 2006. He is asymptomatic.  3. Hyperlipidemia. His lipids are due a check since we started simvastatin      per his recollection in the hospital. He is on 40 q.h.s.   He also had some lower extremity numbness with walking. Lower extremity  Doppler were done on March 10, 2006, showed normal ABIs.   MEDICATIONS:  1. Prilosec 20 mg a day.  2. Plavix 75 mg a day.  3. Xalatan drops.  4. Vitamin C.  5. One-A-Day day vitamin.  6. Zinc.  7. Simvastatin 40 q. h.s.  8. Aspirin 81 a day.   His blood pressure is 135/68. Pulse: 54 and regular. His weight is 124.  Carotid upstrokes are equal bilaterally without bruits. There is no JVD.  Thyroid is not enlarged. Trachea is midline.  LUNGS:  Clear.  CHEST: Reveals a stable sternum. There is normal S1, S2.  ABDOMEN: Soft with good bowel sounds.  EXTREMITIES: Reveals no edema. Pulses were present.   ASSESSMENT/PLAN:  Devin [name] is doing well. He is due lipids and liver  function tests which we will obtain today. He is also due carotid Doppler in  November. Will attempt to schedule these.   Otherwise, I will see him back in a year.     Thomas C. Daleen Squibb, MD, [facility]   TCW/MedQ  DD: 03/20/2006  DT: 03/20/2006  Job #: [id number]   cc:   Angus G. [name], MD

## 2010-10-15 NOTE — Cardiovascular Report (Signed)
Devin [name], Devin [name]            ACCOUNT NO.:  [id number]   MEDICAL RECORD NO.:  [id number]          PATIENT TYPE:  INP   LOCATION:  2028                         FACILITY:  [facility]   PHYSICIAN:  Veverly Fells. Excell Seltzer, MD  DATE OF BIRTH:  02-09-1946   DATE OF PROCEDURE:  02/06/2006  DATE OF DISCHARGE:  02/06/2006                              CARDIAC CATHETERIZATION   PROCEDURE:  Left heart catheterization, selective coronary angiogram, left  ventricular angiogram, saphenous vein graft angiogram, LIMA angiogram and  StarClose of the right femoral artery.   INDICATIONS FOR PROCEDURE:  Devin [name] is a 65 year old male who presented  with a history of coronary artery disease and previous bypass surgery.  He  presented with unstable angina and was subsequently referred for cardiac  catheterization.   ACCESS:  Right femoral artery.   COMPLICATIONS:  None.   PROCEDURAL DETAILS:  Risks and indications  of the procedure were explained  in detail to the patient.  Informed consent was obtained.  The right groin  was prepped and draped in normal sterile fashion.  Using the modified  Seldinger technique, a 6 French right femoral arterial sheath was placed in  the right common femoral artery.  Diagnostic catheters included a 6 [place]  JL4, JR4 and angled pigtail catheter.  The JR4 was used for the vein graft  and LIMA injections.  At the conclusion of the case, the artery was closed  using a StarClose device.  All catheter exchanges were performed over a J-  tip guidewire.   FINDINGS:  Aortic pressure 121/52 with mean of 77.  Left ventricular  pressure 122/4 with an end diastolic pressure of 6.   NATIVE CORONARY ANATOMY:  The left main stem has a 90% distal stenosis.  It  bifurcates into the LAD and left circumflex.   The LAD has an ostial stenosis and is occluded in its proximal segment.   The left circumflex has severe proximal disease and is occluded in its mid  segment. It give off a  small branch obtuse marginal.   Right coronary artery is 100% occluded in its proximal segment.   BYPASS GRAFT ANATOMY:  1. Saphenous vein graft to the first diagonal is widely patent.  The first      diagonal is a small diameter artery but has no discrete stenoses.  2. Saphenous vein graft sequence to the first and second obtuse marginal      vessels is widely patent.  There is no stenosis in the vein graft or      the native obtuse marginals.  3. Saphenous vein graft to the distal right coronary artery is patent.      There is no angiographic disease in this vein graft.  The anastomotic      site is just proximal to the bifurcation of the PDA and posterior AV      segment, both of which fill from the graft.  There is a posterolateral      given off by the posterior AV segment.  4. The LIMA to LAD is widely patent.  The mid and distal LAD are of  small      diameter.  There were no discrete stenoses in the mid or distal LAD.   A left ventriculogram was performed in the 30 degree RAO projection.  It  demonstrated normal left ventricular function with a left ventricular  ejection fraction estimated at 60%.   ASSESSMENT:  1. Severe native three vessel coronary artery disease.  2. 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  3. Small distal coronary arteries.  4. Normal left ventricular function and normal left ventricular filling      pressures.   Findings discussed with Dr. Daleen Squibb.  Will plan on continued medical therapy.  Will start him on a Statin medication in the setting of his coronary artery  disease.  Will plan on discharging him home later today after his  observation period is complete.      Veverly Fells. Excell Seltzer, MD  Electronically Signed     MDC/MEDQ  D:  02/06/2006  T:  02/07/2006  Job:  [id number]   cc:   Thomas C. Wall, MD, [facility] [place]

## 2010-10-15 NOTE — Assessment & Plan Note (Signed)
[facility]                              CARDIOLOGY OFFICE NOTE   NAME:Devin [name], Devin [name]                   MRN:          [id number]  DATE:02/16/2006                            DOB:          12-05-1945    PRIMARY CARE PHYSICIAN:  Devin G. [name], MD.   PRIMARY CARDIOLOGIST:  Devin Sans. Wall, MD, [name].   HISTORY OF PRESENT ILLNESS:  Devin [name] is a very pleasant 65 year old  male patient with a history of coronary disease status post CABG in 2000 who  presented to [name] on February 04, 2006 with complaints of  chest pain. He was admitted for further evaluation. He underwent cardiac  catheterization February 06, 2006. This revealed patent grafts x5 and  normal LV function. (His grafts include a vein graft to the first diagonal,  sequential vein graft to the first and second obtuse marginal, vein graft to  the distal RCA and LIMA to LAD.) The patient was discharged home on the same  day of his catheterization and returns today for followup. He is doing well  since his hospitalization. He has had a couple of episodes of chest  discomfort. These are left-sided and he describes it as tightness. They last  a minute or less. They are not really associated with exertion. He has not  really noticed any since he went back work. 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 He denies any significant shortness of breath.  He denies any syncope, presyncope, orthopnea, or postnocturnal dyspnea. He  has noticed some transient edema in his lower extremities that seems to be  worse when he is on his feet but improves when he lays down. He denies any  pleuritic chest pain. Denies any odynophagia or dysphagia. Denies any  __________  symptoms or belching.   The patient did note some bilateral calf pain with ambulation that goes away  with rest. He is still noting that today.   CURRENT MEDICATIONS:  1. Prilosec 20 mg daily.  2. Plavix  75 mg daily.  3. Xalatan eye drops.  4. Vitamin C.  5. Multivitamin.  6. Zinc.  7. Simvastatin 40 mg q.h.s.  8. Aspirin 81 mg daily.   PHYSICAL EXAMINATION:  GENERAL:  He is a well-developed, well-nourished male  in no distress.  VITAL SIGNS:  Blood pressure is 138/72, pulse 61, weight 129 pounds.  HEENT:  Unremarkable.  NECK:  Without JVD.  CARDIOVASCULAR:  S1, S2, without murmurs, clicks, rubs or gallops.  LUNGS:  Clear to auscultation bilaterally without wheezing, rhonchi or  rales.  ABDOMEN:  Soft, nontender with normal active bowel sounds, no organomegaly.  EXTREMITIES:  Without edema. Calves are soft nontender. Multiple  varicosities noted around the ankle.  NEUROLOGIC:  He is alert and oriented x3. Cranial nerves II-XII are grossly  intact. Electrocardiogram revealed sinus bradycardia with a heart rate of  56, no acute changes, no significant changes to previous tracings.   IMPRESSION:  1. Chest pain, etiology unclear.  2. Coronary artery disease status post coronary artery bypass graft in  2000.  (a)  Patent grafts x5 at recent catheterization.  1. Good left ventricular function with an ejection fraction of 60% at      catheterization.  2. Gastroesophageal reflux disease.  3. Treated dyslipidemia.  (a)  Simvastatin reinitiated during recent admission.  1. History of transient ischemic attacks.  (a)  Plavix therapy.  1. History of right carotid stenosis.  (a)  Due for carotid Dopplers November 2008.  (b)  Bilateral extremity pain with ambulation - rule out claudication.  1. Cervical degenerative disk disease status post surgery.   PLAN:  The patient is doing well from a post hospitalization standpoint. I  think his chest pain is quite atypical. I have asked him to increase his  Prilosec to 20 mg twice a day. He will be set up for fasting lipids and LFTs  in about 6-8 weeks to followup on his cholesterol management. The patient  will also be set up for arterial  Dopplers of his lower extremity as well as  ABIs to rule out significant stenosis in his lower extremities. Will make  sure he is set up for carotid Dopplers in November 2008 as it should be  scheduled. He has a followup with Devin [name] next month and he can go ahead  and keep that appointment.                                  Tereso Newcomer, PA-C                             Devin Sidle, MD   SW/MedQ  DD:  02/16/2006  DT:  02/17/2006  Job #:  [id number]   cc:   Devin G. [name], MD

## 2010-10-15 NOTE — H&P (Signed)
Devin [name], Devin [name]            ACCOUNT NO.:  [id number]   MEDICAL RECORD NO.:  [id number]          PATIENT TYPE:  INP   LOCATION:  2028                         FACILITY:  [facility]   PHYSICIAN:  Duke Salvia, MD, FACCDATE OF BIRTH:  [birth year]   DATE OF ADMISSION:  02/04/2006  DATE OF DISCHARGE:                                HISTORY & PHYSICAL   PRIMARY CARE PHYSICIAN:  Dr. [name] in [place].   PRIMARY CARDIOLOGIST:  Dr. Valera Castle.   CHIEF COMPLAINT:  Chest pain.   HISTORY OF PRESENT ILLNESS:  Mr. Lawry is a very pleasant 65 year old  male patient with a history of coronary disease, status post CABG seven  years ago at [facility].  He was in his usual state of health until  today when he developed a sudden onset of substernal chest pain while at  work.  He works in a [employer] and does fairly labor intensive work.  While exerting himself, he develops substernal and left-sided chest  discomfort.  He describes this mainly as an ache.  He is unable to qualify  it more than that.  He felt weak with the discomfort as well as some  diaphoresis.  He says he felt as though he may pass out.  He was asked by  his coworkers to go to the emergency room.  At the [facility]  Emergency Room, he was still having substernal chest pain.  At this point,  he described it as a pressure.  He did have some radiation to his left arm.  He was placed on nitro paste, heparin and given IV Lopressor x1.  He was  transferred to [facility] for further evaluation and treatment.  He  is currently painfree in the emergency room at [facility].   PAST MEDICAL HISTORY:  Significant for coronary disease, status post CABG at  [facility] seven years ago.  The previous records are currently  unavailable and the grafts were unknown at this time.  He last saw Dr. Daleen Squibb  in our [place] office back in October of 2006.  According to the notes,  he had a Cardiolite  study done July of 2005 that showed an EF of 53% with  apical thinning but no ischemia.  The patient has a history of  nonobstructive carotid disease with 60-70% right internal carotid artery  stenosis by Dopplers on December of 2004.  He has a history of TIAs and is  currently on Plavix.  He has a history of hyperlipidemia and was previously  on Lipitor 5 mg every other day but has been off of this medication for a  couple of years now.  He has a history of peptic ulcer disease with upper GI  bleed in the past secondary to a combination of aspirin and Plavix.  He also  has gastroesophageal reflux disease.  He has been placed on proton pump  inhibitor and has been stable over the last several years.  He has a history  of cervical degenerative disc disease and is status post surgical repair.   MEDICATIONS:  1. Plavix  75 mg a day.  2. Aspirin 325 mg daily.  3. Prilosec daily   ALLERGIES:  He has no known drug allergies but does note that uncoated  aspirin causes GI upset.   SOCIAL HISTORY:  The patient lives in [place] with his wife and works in  Designer, fashion/clothing.  He smoked cigars for the lat 45 years and denies alcohol abuse.   FAMILY HISTORY:  Insignificant for coronary artery disease.   REVIEW OF SYSTEMS:  See HPI.  Denies any fevers, chills, sweats.  He had a  sore throat about two weeks ago but this has resolved.  Denies any headaches  or rash.  He denies any significant shortness of breath or dyspnea on  exertion, paroxysm nocturnal dyspnea.  He did note some palpitations today  with his chest pain.  He denies any lower extremity edema.  He does note  some lower extremity discomfort with ambulation.  This goes away with rest.  He denies any frank syncope.  He has noted some weakness with his symptoms.  He denies any dysuria or hematuria.  Denies any nausea, vomiting,  diaphoresis, bright red blood per rectum or melena.  He does note low back  pain.  The other systems are  negative.   PHYSICAL EXAMINATION:  He is a well-developed, well-nourished in no  distress.  Blood pressure is 192/68, pulse 50, respirations 12, temperature  97.3.  Oxygen saturation 99% on two liters.  Head normocephalic and  atraumatic.  Eyes PERRLA.  EOMI.  Sclerae are clear.  Oropharynx pink  without exudate.  Neck without lymphadenopathy.  No JVD noted.  Positive  right bruit noted.  Cardiac normal S1 and S2.  Regular rate and rhythm  without murmurs, clicks, rubs or gallops.  Lungs are with decreased breath  sounds bilaterally without rales.  Skin is warm and dry.  Abdomen is soft  and nontender with normal active bowel sounds.  No rebound, no guarding, no  organomegaly.  Extremities without clubbing, cyanosis, or edema.  Musculoskeletal without spine or CVA tenderness.  Neurologic he is alert and  oriented x3.  Cranial nerves II through XII are grossly intact.  Vascular  exam show femoral pulses are 2+ bilaterally without bruits.  Dorsalis pedis  pulses and posterior tibialis pulses are 2+ bilaterally.   Chest x-ray shows no acute findings, chronic stable lung disease.  EKG shows  sinus rhythm with a heart rate of 55, normal axis, no acute changes.  Laboratory data is pending.   IMPRESSION:  1. Chest pain, worrisome for unstable angina pectoris.  2. Coronary artery disease, status post coronary artery bypass graft x7      years ago.  3. Untreated dyslipidemia.  4. History of transient ischemic attacks on Plavix therapy.  5. History of right carotid stenosis.  6. Gastroesophageal reflux disease/peptic ulcer disease.      a.     History of upper gastrointestinal bleeding in the past.  7. Cervical degenerative disc disease status post surgery.  8. Bilateral lower extremity pain with ambulation, rule out claudication.   PLAN:  The patient will be admitted and continued on nitro paste, aspirin and heparin.  We will continue his Plavix.  We will also add Lipitor 40 mg  q.h.s. and  cycle his enzymes.  We will order a tobacco cessation consult.  We will also order carotid Dopplers.  At this point and time, we plan  cardiac catheterization Monday, February 06, 2006.  Risks and benefits have been discussed with  the patient and his wife and  they both agree to proceed.  The patient's lower extremity pain with  ambulation will be worked up further as an outpatient.  At discharge, we can  set him for outpatient arterial Dopplers and ADIs.     ______________________________  Tereso Newcomer, PA-C    ______________________________  Duke Salvia, MD, [facility]    SW/MEDQ  D:  02/04/2006  T:  02/04/2006  Job:  [id number]   cc:   Angus G. [name], MD

## 2010-10-15 NOTE — Discharge Summary (Signed)
[name], [name]            ACCOUNT NO.:  [id number]   MEDICAL RECORD NO.:  [id number]          PATIENT TYPE:  INP   LOCATION:  2028                         FACILITY:  [facility]   PHYSICIAN:  Devin Fells. Excell Seltzer, MD  DATE OF BIRTH:  1945-07-11   DATE OF ADMISSION:  02/04/2006  DATE OF DISCHARGE:  02/06/2006                                 DISCHARGE SUMMARY   PRIMARY CARE PHYSICIAN:  Dr. Renard [name] in [place].   PRIMARY CARDIOLOGIST:  Dr. Valera [name].   PRINCIPAL DIAGNOSIS:  Unstable angina/coronary artery disease.   SECONDARY DIAGNOSES:  1. Hyperlipidemia.  2. History of transient ischemic attacks on chronic Plavix therapy.  3. Cervical degenerative disc disease, status post surgical repair.  4. Peptic ulcer disease with remote gastrointestinal bleed.  5. Gastroesophageal reflux disease.  6. A history of right carotid stenosis measured at 60% to 70%.  7. Internal carotid artery stenosis in 2004.  8. Tobacco abuse/cigars x45 years.   ALLERGIES:  ASPIRIN CAUSES GI UPSET.   PROCEDURES:  Left heart cardiac catheterization.   HISTORY OF PRESENT ILLNESS:  [age]-year-old married, white male with  prior history of CAD, status post CABG approximately 7 years ago who was in  his usual state of health until February 04, 2006 when he developed a sudden  onset of substernal chest pain while at work associated with weakness and  diaphoresis.  He was taken to [facility] where he continued to have  chest pressure with radiation to the left arm.  He was started on heparin,  nitroglycerin and Lopressor and was transferred to [facility] for further  evaluation.   HOSPITAL COURSE:  Devin [name] ruled out for MI by cardiac markers.  ECG was  without acute changes.  Upon arrival to [facility], he was pain free and  plans were made for cardiac catheterization.  He underwent left heart  cardiac catheterization this afternoon, which revealed native 3-vessel  disease with patent  grafts x5.  EF was calculated at 60%.  Post  catheterization, he has had no recurrent chest discomfort.  He has been  ambulating without difficulty.  He is being discharged home this evening in  satisfactory condition.   DISCHARGE LABS:  Hemoglobin 14.1, hematocrit 41.1, WBC 8.1, platelets 161,  MCV 99.5, sodium 139, potassium 4.1, chloride 111, CO2 23, BUN 11,  creatinine 0.7, glucose 82, total bilirubin 0.6, alkaline phosphatase 60,  AST 25, ALT 25, albumin 3.3, calcium 8.8.  Cardiac markers negative x3.  Total cholesterol 161, triglycerides 93, HDL 60, LDL 92.  TSH 1.348.  D-  dimer was 0.31.   DISPOSITION:  Patient is being discharged home today in good condition.   FOLLOWUP PLANS AND APPOINTMENT:  He is to follow with up Dr. Valera [name] on  September 20, at 9:45 a.m.  He is asked to follow up with his primary care  physician, Dr. Renard [name], in 3 to 4 weeks.   DISCHARGE MEDICATIONS:  1. Aspirin 81 mg daily.  2. Plavix 75 mg daily.  3. Simvastatin 40 mg q.h.s.  4. Prilosec 20 mg daily.  5. Xalatan as previously prescribed.  6. Nitroglycerin 0.4 mg sublingual p.r.n. chest pain.   OUTSTANDING LABS:  None.   DURATION OF DISCHARGE ENCOUNTER:  Forty minutes including physician time.     ______________________________  Devin [name], ANP      Devin Fells. Excell Seltzer, MD  Electronically Signed    CB/MEDQ  D:  02/06/2006  T:  02/06/2006  Job:  [id number]   cc:   Devin G. Devin Matter, MD

## 2010-11-09 ENCOUNTER — Ambulatory Visit (INDEPENDENT_AMBULATORY_CARE_PROVIDER_SITE_OTHER): Payer: BC Managed Care – PPO | Admitting: Orthopedic Surgery

## 2010-11-09 DIAGNOSIS — M67919 Unspecified disorder of synovium and tendon, unspecified shoulder: Secondary | ICD-10-CM | POA: Insufficient documentation

## 2010-11-09 DIAGNOSIS — M719 Bursopathy, unspecified: Secondary | ICD-10-CM

## 2010-11-09 NOTE — Progress Notes (Signed)
   Visit  Pain RIGHT shoulder status post injection  Previous imaging shoulder x-ray.  No MRI at this time  The injection helped for several weeks and then the pain came back.  He acquired pain medication for that.  His job involves frequent overhead lifting and reaching  Review of systems no neck pain no numbness no tingling  Pain is moderate to severe located over the RIGHT acromial and deltoid area associated with painful forward elevation  GENERAL: normal development   CDV: pulses are normal   Skin: normal  Lymph: deferred  Psychiatric: awake, alert and oriented  Neuro: normal sensation  MSK  RIGHT shoulder shows tenderness in the posterior subacromial space as well as the anterolateral acromion.  He has a positive impingement sign.  He does not have weakness of his rotator cuff he has no giving out when he is bringing his arm down.  His shoulder is stable.  Muscle tone is normal.  No swelling.  Impression rotator cuff syndrome  Plan repeat injection.  Come back in 4 weeks reassess

## 2010-11-09 NOTE — Procedures (Signed)
Separately identifiable procedure report  Informed consent was obtained verbally.  Time out was taken.  RIGHT shoulder was injected subacromial space.   Alcohol was used to prep the shoulder, along with ethyl chloride.  40 mg of Depo-Medrol and 3 cc of 1% lidocaine was injected into the subacromial space.  There were no complications 

## 2010-11-09 NOTE — Patient Instructions (Signed)
You have received a steroid shot. 15% of patients experience increased pain at the injection site with in the next 24 hours. This is best treated with ice and tylenol extra strength 2 tabs every 8 hours. If you are still having pain please call the office.    

## 2010-12-07 ENCOUNTER — Encounter: Payer: Self-pay | Admitting: Orthopedic Surgery

## 2010-12-15 ENCOUNTER — Ambulatory Visit (INDEPENDENT_AMBULATORY_CARE_PROVIDER_SITE_OTHER): Payer: BC Managed Care – PPO | Admitting: Orthopedic Surgery

## 2010-12-15 ENCOUNTER — Encounter: Payer: Self-pay | Admitting: Orthopedic Surgery

## 2010-12-15 DIAGNOSIS — M25519 Pain in unspecified shoulder: Secondary | ICD-10-CM

## 2010-12-15 DIAGNOSIS — M75101 Unspecified rotator cuff tear or rupture of right shoulder, not specified as traumatic: Secondary | ICD-10-CM | POA: Insufficient documentation

## 2010-12-15 DIAGNOSIS — S43429A Sprain of unspecified rotator cuff capsule, initial encounter: Secondary | ICD-10-CM

## 2010-12-15 DIAGNOSIS — M67919 Unspecified disorder of synovium and tendon, unspecified shoulder: Secondary | ICD-10-CM

## 2010-12-15 DIAGNOSIS — M75100 Unspecified rotator cuff tear or rupture of unspecified shoulder, not specified as traumatic: Secondary | ICD-10-CM

## 2010-12-15 MED ORDER — METHYLPREDNISOLONE ACETATE 40 MG/ML IJ SUSP: 40.0000 mg | Freq: Once | INTRAMUSCULAR | Status: DC

## 2010-12-15 NOTE — Progress Notes (Signed)
  Chief complaint RIGHT shoulder pain  65 year old male complains of sharp throbbing RIGHT shoulder pain of moderate severity 6/10 for the last 5 months with no history of trauma. His pain came on gradually and it's worse when he is using his arm overhead and eases off when he is not using his arm except it does also hurt at night. Review of systems musculoskeletal as stated neurologic normal.  I've treated him with 2 cortisone injections and oral pain medication including hydrocodone.  He takes Plavix so we have stayed away from anti-inflammatory drugs.  He is also tried activity modification although he has continued to work.  He actually had to leave work last week because of shoulder pain.  Review of systems negative for numbness or tingling in the RIGHT hand  History of cervical neck fusion over 10 years ago by Dr. Reita Cliche  Review of system addendum he does have some neck pain which radiates towards her RIGHT shoulder at the inferior border of the scapular spine.  Exam GENERAL: normal development   CDV: pulses are normal   Skin: normal  Lymph: deferred  Psychiatric: awake, alert and oriented  Neuro: normal sensation  MSK  Cervical spine tenderness at the base of the cervical spine and also at the level inferiorly with tracking tenderness along the muscle towards the scapular spine and infraspinatus fossa.  The shoulder has painful range of motion especially in the arc of 90-150 with crepitance.  He has mild weakness of the supraspinatus tendon tear muscle resistance testing.  He has a positive impingement sign.  He has a negative sulcus test and he has a normal abduction external rotation test.  There is tenderness on the posterior joint line and anterior joint line and anterolateral deltoid.  He is awake alert and oriented x3 his mood and affect are normal he has normal sensation in his extremities.  Skin RIGHT shoulder cervical spine and upper thoracic region normal.  No LEFT  nodes are palpable.  Pulses are good.  He is a small framed man and he has normal appearance  X-rays were negative for any acute disease of glenohumeral arthritis  Impression #1 shoulder pain #2 rotator cuff syndrome #3 possible rotator cuff tear  Plan repeat injection MRI RIGHT shoulder to look for rotator cuff tear which were changed treatment  Patient is out of work for the next week and a half.

## 2010-12-15 NOTE — Patient Instructions (Addendum)
Come back in 2 weeks MRI results  OOW for 2 weeks

## 2010-12-17 ENCOUNTER — Telehealth: Payer: Self-pay | Admitting: *Deleted

## 2010-12-17 NOTE — Telephone Encounter (Signed)
Advised patient of MRI appt for Tuesday July 24th at 12:30pm to bring disc 12/30/10 to followup appt, No precert was needed for insurance [employer] per [employer].

## 2010-12-21 ENCOUNTER — Ambulatory Visit (HOSPITAL_COMMUNITY)
Admission: RE | Admit: 2010-12-21 | Discharge: 2010-12-21 | Disposition: A | Discharge status: Home / Self Care | Payer: BC Managed Care – PPO | Source: Ambulatory Visit | Attending: Orthopedic Surgery | Admitting: Orthopedic Surgery

## 2010-12-21 DIAGNOSIS — M67919 Unspecified disorder of synovium and tendon, unspecified shoulder: Secondary | ICD-10-CM | POA: Insufficient documentation

## 2010-12-21 DIAGNOSIS — M719 Bursopathy, unspecified: Secondary | ICD-10-CM | POA: Insufficient documentation

## 2010-12-21 DIAGNOSIS — M25519 Pain in unspecified shoulder: Secondary | ICD-10-CM | POA: Insufficient documentation

## 2010-12-30 ENCOUNTER — Ambulatory Visit (INDEPENDENT_AMBULATORY_CARE_PROVIDER_SITE_OTHER): Payer: BC Managed Care – PPO | Admitting: Orthopedic Surgery

## 2010-12-30 ENCOUNTER — Encounter: Payer: Self-pay | Admitting: Orthopedic Surgery

## 2010-12-30 DIAGNOSIS — M67929 Unspecified disorder of synovium and tendon, unspecified upper arm: Secondary | ICD-10-CM | POA: Insufficient documentation

## 2010-12-30 DIAGNOSIS — M19019 Primary osteoarthritis, unspecified shoulder: Secondary | ICD-10-CM | POA: Insufficient documentation

## 2010-12-30 DIAGNOSIS — M7580 Other shoulder lesions, unspecified shoulder: Secondary | ICD-10-CM | POA: Insufficient documentation

## 2010-12-30 DIAGNOSIS — M719 Bursopathy, unspecified: Secondary | ICD-10-CM

## 2010-12-30 DIAGNOSIS — M7520 Bicipital tendinitis, unspecified shoulder: Secondary | ICD-10-CM

## 2010-12-30 NOTE — Progress Notes (Signed)
MRI RIGHT shoulder.  MRI shows torn bursal and articular side, tissue of the rotator cuff, synovitis, biceps tendinopathy, rotator cuff tendinopathy, a.c. Joint arthrosis.  I discussed this with him and gave him options of surgery or nonsurgical treatment and he opted for surgical treatment.  We then, Scheduled for SARS/mini open rotator cuff repair   See h/p

## 2010-12-30 NOTE — Progress Notes (Signed)
Addended by: Fuller Canada MD E on: 12/30/2010 05:42 PM   Modules accepted: Orders

## 2010-12-30 NOTE — Patient Instructions (Signed)
You have been scheduled for surgery.  All surgeries carry some risk.  Remember you always have the option of continued nonsurgical treatment. However in this situation the risks vs. the benefits favor surgery as the best treatment option. The risks of the surgery includes the following but is not limited to bleeding, infection, pulmonary embolus, death from anesthesia, nerve injury vascular injury or need for further surgery, continued pain.  Specific to this procedure the following risks and complications are rare but possible Stiffness, weakness and pain

## 2010-12-31 ENCOUNTER — Telehealth: Payer: Self-pay | Admitting: Orthopedic Surgery

## 2010-12-31 NOTE — Telephone Encounter (Signed)
Kim from [facility] (ph [phone]) called to relay patient's pre-op appointment information: Monday 01/03/11 at 1:45pm   I called patient; left message with appointment information on both home [phone] and cell ph# [phone] and asked patient to call back to confirm.

## 2010-12-31 NOTE — Telephone Encounter (Signed)
Spoke with patient and relayed pre-op appointment.

## 2011-01-03 ENCOUNTER — Ambulatory Visit (HOSPITAL_COMMUNITY)
Admission: RE | Admit: 2011-01-03 | Discharge: 2011-01-03 | Disposition: A | Discharge status: Home / Self Care | Payer: BC Managed Care – PPO | Source: Ambulatory Visit | Attending: Orthopedic Surgery | Admitting: Orthopedic Surgery

## 2011-01-03 ENCOUNTER — Encounter (HOSPITAL_COMMUNITY)
Admission: RE | Admit: 2011-01-03 | Discharge: 2011-01-03 | Disposition: A | Discharge status: Home / Self Care | Payer: BC Managed Care – PPO | Source: Ambulatory Visit | Attending: Orthopedic Surgery | Admitting: Orthopedic Surgery

## 2011-01-03 ENCOUNTER — Encounter (HOSPITAL_COMMUNITY): Payer: Self-pay

## 2011-01-03 ENCOUNTER — Other Ambulatory Visit: Payer: Self-pay

## 2011-01-03 HISTORY — DX: Unspecified hearing loss, unspecified ear: H91.90

## 2011-01-03 HISTORY — DX: Chronic kidney disease, unspecified: N18.9

## 2011-01-03 HISTORY — DX: Shortness of breath: R06.02

## 2011-01-03 HISTORY — DX: Unspecified osteoarthritis, unspecified site: M19.90

## 2011-01-03 LAB — CBC
Hemoglobin: 14 g/dL (ref 13.0–17.0)
MCH: 33.8 pg (ref 26.0–34.0)
MCV: 97.3 fL (ref 78.0–100.0)
RBC: 4.14 MIL/uL — ABNORMAL LOW (ref 4.22–5.81)

## 2011-01-03 LAB — BASIC METABOLIC PANEL
BUN: 10 mg/dL (ref 6–23)
CO2: 21 mEq/L (ref 19–32)
Calcium: 9 mg/dL (ref 8.4–10.5)
Glucose, Bld: 115 mg/dL — ABNORMAL HIGH (ref 70–99)
Potassium: 3.8 mEq/L (ref 3.5–5.1)
Sodium: 139 mEq/L (ref 135–145)

## 2011-01-03 MED ORDER — LACTATED RINGERS IV SOLN: INTRAVENOUS | Status: DC

## 2011-01-03 NOTE — Patient Instructions (Addendum)
20 KEVRON PATELLA  01/03/2011   Your procedure is scheduled on:  01/07/2011  Report to [facility] a 1020  AM.  Call this number if you have problems the morning of surgery: [phone]   Remember:   Do not eat food:After Midnight.  Do not drink clear liquids: After Midnight.  Take these medicines the morning of surgery with A SIP OF WATER: Prilosec   Do not wear jewelry, make-up or nail polish.  Do not wear lotions, powders, or perfumes. You may wear deodorant.  Do not shave 48 hours prior to surgery.  Do not bring valuables to the hospital.  Contacts, dentures or bridgework may not be worn into surgery.  Leave suitcase in the car. After surgery it may be brought to your room.  For patients admitted to the hospital, checkout time is 11:00 AM the day of discharge.   Patients discharged the day of surgery will not be allowed to drive home.  Name and phone number of your driver: family  Special Instructions: CHG Shower Use Special Wash: 1/2 bottle night before surgery and 1/2 bottle morning of surgery.   Please read over the following fact sheets that you were given: Pain Booklet, MRSA Information, Surgical Site Infection Prevention, Anesthesia Post-op Instructions and Care and Recovery After Surgery PATIENT INSTRUCTIONS POST-ANESTHESIA  IMMEDIATELY FOLLOWING SURGERY:  Do not drive or operate machinery for the first twenty four hours after surgery.  Do not make any important decisions for twenty four hours after surgery or while taking narcotic pain medications or sedatives.  If you develop intractable nausea and vomiting or a severe headache please notify your doctor immediately.  FOLLOW-UP:  Please make an appointment with your surgeon as instructed. You do not need to follow up with anesthesia unless specifically instructed to do so.  WOUND CARE INSTRUCTIONS (if applicable):  Keep a dry clean dressing on the anesthesia/puncture wound site if there is drainage.  Once the wound has quit  draining you may leave it open to air.  Generally you should leave the bandage intact for twenty four hours unless there is drainage.  If the epidural site drains for more than 36-48 hours please call the anesthesia department.  QUESTIONS?:  Please feel free to call your physician or the hospital operator if you have any questions, and they will be happy to assist you.     [facility] Anesthesia Department [address] [place] [phone]

## 2011-01-05 ENCOUNTER — Telehealth: Payer: Self-pay | Admitting: Orthopedic Surgery

## 2011-01-05 ENCOUNTER — Encounter: Payer: Self-pay | Admitting: Orthopedic Surgery

## 2011-01-05 ENCOUNTER — Other Ambulatory Visit: Payer: Self-pay | Admitting: Orthopedic Surgery

## 2011-01-05 NOTE — H&P (Signed)
Devin [name] is an 65 y.o. male.   Chief Complaint: right shoulder pain  HPI: the patient Complains of continued RIGHT shoulder pain despite multiple injections, physical therapy, activity modification. Is not improved with nonsurgical measures and now presents for arthroscopy. RIGHT shoulder with mini open rotator cuff repair. His MRI shows that he has multiple problems in his RIGHT shoulder, including degenerative changes of the tendon, spur formation. Degenerative changes in the joint. There is tearing, which appears to be partial of the cuff and an assessment will be made at the time of surgery, whether it needs to be repaired, but most likely, it will.    Past Medical History  Diagnosis Date  . Coronary atherosclerosis of artery bypass graft   . Personal history of unspecified circulatory disease   . 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 Chest pain, unspecified   . Other and unspecified hyperlipidemia   . TIA (transient ischemic attack)   . Carpal tunnel syndrome   . Asthma   . Shortness of breath   . Chronic kidney disease     hx of kidney stones  . Arthritis   . HOH (hard of hearing)     Past Surgical History  Procedure Date  . Neck surgery   . Carpal tunnel release     blateral carpal tunnel  . Coronary artery bypass graft     x5 12 yrs ago  . Back surgery     cervical disc    Family History  Problem Relation Age of Onset  . Heart disease    . Arthritis    . Asthma    . Coronary artery disease    . Anesthesia problems Neg Hx   . Hypotension Neg Hx   . Malignant hyperthermia Neg Hx   . Pseudochol deficiency Neg Hx    Social History:  reports that he has been smoking Cigars and Cigarettes.  He has a 50 pack-year smoking history. He does not have any smokeless tobacco history on file. He reports that he does not drink alcohol or use illicit drugs.  Allergies:  Allergies  Allergen Reactions  . Aspirin Other (See  Comments)    Has to take the coated aspirin    Medications Prior to Admission  Medication Sig Dispense Refill  . aspirin (ASPIRIN LOW DOSE) 81 MG EC tablet Take 81 mg by mouth daily.        . clopidogrel (PLAVIX) 75 MG tablet Take 75 mg by mouth daily.       . dorzolamide-timolol (COSOPT) 22.3-6.8 MG/ML ophthalmic solution 1 drop as directed.        . latanoprost (XALATAN) 0.005 % ophthalmic solution Place 1 drop into both eyes daily.       . nitroGLYCERIN (NITROSTAT) 0.4 MG SL tablet Place 0.4 mg under the tongue. 1 tablet under the tongue at onset of chest pain; you may repeat every 5 mins for up to 3 doses      . omeprazole (PRILOSEC OTC) 20 MG tablet Take 20 mg by mouth daily.        . PROAIR HFA 108 (90 BASE) MCG/ACT inhaler Inhale 1 puff into the lungs every 4 (four) hours as needed. Shortness of Breath      . simvastatin (ZOCOR) 40 MG tablet Take 40 mg by mouth at bedtime.        . traMADol-acetaminophen (ULTRACET) 37.5-325 MG per tablet Take 1 tablet by mouth every 6 (  six) hours as needed.         Medications Prior to Admission  Medication Dose Route Frequency Provider Last Rate Last Dose  . methylPREDNISolone acetate (DEPO-MEDROL) injection 40 mg  40 mg Intra-articular Once Stanley Harrison, MD      . methylPREDNISolone acetate (DEPO-MEDROL) injection 40 mg  40 mg Intra-articular Once Stanley Harrison, MD      . DISCONTD: lactated ringers infusion   Intravenous Continuous Luis Gonzalez        Results for orders placed during the hospital encounter of 01/03/11 (from the past 48 hour(s))  SURGICAL PCR SCREEN     Status: Normal   Collection Time   01/03/11  1:46 PM      Component Value Range Comment   MRSA, PCR NEGATIVE  NEGATIVE     Staphylococcus aureus NEGATIVE  NEGATIVE    BASIC METABOLIC PANEL     Status: Abnormal   Collection Time   01/03/11  2:00 PM      Component Value Range Comment   Sodium 139  135 - 145 (mEq/L)    Potassium 3.8  3.5 - 5.1 (mEq/L)    Chloride 104  96 -  112 (mEq/L)    CO2 21  19 - 32 (mEq/L)    Glucose, Bld 115 (*) 70 - 99 (mg/dL)    BUN 10  6 - 23 (mg/dL)    Creatinine, Ser 0.76  0.50 - 1.35 (mg/dL)    Calcium 9.0  8.4 - 10.5 (mg/dL)    GFR calc non Af Amer >60  >60 (mL/min)    GFR calc Af Amer >60  >60 (mL/min)   CBC     Status: Abnormal   Collection Time   01/03/11  2:00 PM      Component Value Range Comment   WBC 9.1  4.0 - 10.5 (K/uL)    RBC 4.14 (*) 4.22 - 5.81 (MIL/uL)    Hemoglobin 14.0  13.0 - 17.0 (g/dL)    HCT 40.3  39.0 - 52.0 (%)    MCV 97.3  78.0 - 100.0 (fL)    MCH 33.8  26.0 - 34.0 (pg)    MCHC 34.7  30.0 - 36.0 (g/dL)    RDW 13.7  11.5 - 15.5 (%)    Platelets 151  150 - 400 (K/uL)    @[id number]@  Review of Systems  All other systems reviewed and are negative.    There were no vitals taken for this visit. Physical Exam  Constitutional: He is oriented to person, place, and time. He appears well-developed and well-nourished.  HENT:  Head: Normocephalic and atraumatic.  Eyes: Pupils are equal, round, and reactive to light.  Neck: Normal range of motion. Neck supple.  Cardiovascular: Normal rate and regular rhythm.   Respiratory: Effort normal and breath sounds normal.  GI: Soft. Bowel sounds are normal.  Musculoskeletal:       Right shoulder: He exhibits decreased range of motion, tenderness, bony tenderness, swelling and crepitus.       Left shoulder: Normal.       Right elbow: Normal.      Left elbow: Normal.       Right wrist: Normal.       Left wrist: Normal.  Lymphadenopathy:    He has no cervical adenopathy.    He has no axillary adenopathy.  Neurological: He is alert and oriented to person, place, and time. He has normal reflexes. He displays no atrophy. No cranial nerve deficit or sensory   deficit. He exhibits normal muscle tone.  Skin: Skin is warm and dry. No abrasion and no rash noted.  Psychiatric: He has a normal mood and affect. His speech is normal and behavior is normal. Judgment and  thought content normal. Cognition and memory are normal.     Assessment/Plan SARS, Mini open rotator cuff repair   Stanley Harrison 01/05/2011, 11:59 AM    

## 2011-01-05 NOTE — Telephone Encounter (Signed)
01/04/11 at 13:29hrs, contacted [employer] re: out-patient surgery scheduled at [facility] 01/07/11. Per Aloha Gell, CPT [phone], [phone], ICD9 727.61 do not require pre-authorization for out-patient.  His name and above time and date for reference for the call.

## 2011-01-07 ENCOUNTER — Other Ambulatory Visit: Payer: Self-pay | Admitting: Orthopedic Surgery

## 2011-01-07 ENCOUNTER — Encounter (HOSPITAL_COMMUNITY): Payer: Self-pay | Admitting: Anesthesiology

## 2011-01-07 ENCOUNTER — Ambulatory Visit (HOSPITAL_COMMUNITY): Payer: BC Managed Care – PPO | Admitting: Anesthesiology

## 2011-01-07 ENCOUNTER — Encounter (HOSPITAL_COMMUNITY): Payer: Self-pay | Admitting: *Deleted

## 2011-01-07 ENCOUNTER — Ambulatory Visit (HOSPITAL_COMMUNITY)
Admission: RE | Admit: 2011-01-07 | Discharge: 2011-01-07 | Disposition: A | Discharge status: Home / Self Care | Payer: BC Managed Care – PPO | Source: Ambulatory Visit | Attending: Orthopedic Surgery | Admitting: Orthopedic Surgery

## 2011-01-07 ENCOUNTER — Encounter (HOSPITAL_COMMUNITY)
Admission: RE | Disposition: A | Discharge status: Home / Self Care | Payer: Self-pay | Source: Ambulatory Visit | Attending: Orthopedic Surgery

## 2011-01-07 DIAGNOSIS — Z7982 Long term (current) use of aspirin: Secondary | ICD-10-CM | POA: Insufficient documentation

## 2011-01-07 DIAGNOSIS — M249 Joint derangement, unspecified: Secondary | ICD-10-CM | POA: Insufficient documentation

## 2011-01-07 DIAGNOSIS — X58XXXA Exposure to other specified factors, initial encounter: Secondary | ICD-10-CM | POA: Insufficient documentation

## 2011-01-07 DIAGNOSIS — Z01812 Encounter for preprocedural laboratory examination: Secondary | ICD-10-CM | POA: Insufficient documentation

## 2011-01-07 DIAGNOSIS — M19019 Primary osteoarthritis, unspecified shoulder: Secondary | ICD-10-CM | POA: Insufficient documentation

## 2011-01-07 DIAGNOSIS — Z01818 Encounter for other preprocedural examination: Secondary | ICD-10-CM | POA: Insufficient documentation

## 2011-01-07 DIAGNOSIS — M75120 Complete rotator cuff tear or rupture of unspecified shoulder, not specified as traumatic: Secondary | ICD-10-CM

## 2011-01-07 DIAGNOSIS — Z01810 Encounter for preprocedural cardiovascular examination: Secondary | ICD-10-CM | POA: Insufficient documentation

## 2011-01-07 DIAGNOSIS — M6580 Other synovitis and tenosynovitis, unspecified site: Secondary | ICD-10-CM | POA: Insufficient documentation

## 2011-01-07 DIAGNOSIS — S43429A Sprain of unspecified rotator cuff capsule, initial encounter: Secondary | ICD-10-CM | POA: Insufficient documentation

## 2011-01-07 DIAGNOSIS — M66329 Spontaneous rupture of flexor tendons, unspecified upper arm: Secondary | ICD-10-CM

## 2011-01-07 DIAGNOSIS — Z8673 Personal history of transient ischemic attack (TIA), and cerebral infarction without residual deficits: Secondary | ICD-10-CM | POA: Insufficient documentation

## 2011-01-07 SURGERY — ARTHROSCOPY, SHOULDER, WITH ROTATOR CUFF REPAIR
Anesthesia: General | Site: Shoulder | Laterality: Right | Wound class: Clean

## 2011-01-07 MED ORDER — BUPIVACAINE-EPINEPHRINE 0.5% -1:200000 IJ SOLN
INTRAMUSCULAR | Status: DC | PRN
  Administered 2011-01-07 (×2): 30 mL

## 2011-01-07 MED ORDER — LIDOCAINE HCL 1 % IJ SOLN
INTRAMUSCULAR | Status: DC | PRN
  Administered 2011-01-07: 10 mg via INTRADERMAL

## 2011-01-07 MED ORDER — LIDOCAINE HCL (PF) 1 % IJ SOLN
INTRAMUSCULAR | Status: AC
  Filled 2011-01-07: qty 5

## 2011-01-07 MED ORDER — ROCURONIUM BROMIDE 100 MG/10ML IV SOLN
INTRAVENOUS | Status: DC | PRN
  Administered 2011-01-07 (×2): 10 mg via INTRAVENOUS
  Administered 2011-01-07: 5 mg via INTRAVENOUS
  Administered 2011-01-07: 25 mg via INTRAVENOUS

## 2011-01-07 MED ORDER — SODIUM CHLORIDE 0.9 % IR SOLN
Status: DC | PRN
  Administered 2011-01-07: 1000 mL

## 2011-01-07 MED ORDER — CHLORHEXIDINE GLUCONATE 4 % EX LIQD: 60.0000 mL | Freq: Once | CUTANEOUS | Status: DC

## 2011-01-07 MED ORDER — NEOSTIGMINE METHYLSULFATE 1 MG/ML IJ SOLN
INTRAMUSCULAR | Status: AC
  Filled 2011-01-07: qty 10

## 2011-01-07 MED ORDER — ONDANSETRON HCL 4 MG/2ML IJ SOLN
4.0000 mg | Freq: Once | INTRAMUSCULAR | Status: AC
  Administered 2011-01-07: 4 mg via INTRAVENOUS

## 2011-01-07 MED ORDER — ONDANSETRON HCL 4 MG/2ML IJ SOLN
INTRAMUSCULAR | Status: AC
  Filled 2011-01-07: qty 2

## 2011-01-07 MED ORDER — ACETAMINOPHEN 500 MG PO TABS
ORAL_TABLET | ORAL | Status: AC
  Administered 2011-01-07: 500 mg via ORAL
  Filled 2011-01-07: qty 1

## 2011-01-07 MED ORDER — GLYCOPYRROLATE 0.2 MG/ML IJ SOLN
INTRAMUSCULAR | Status: AC
  Filled 2011-01-07: qty 2

## 2011-01-07 MED ORDER — FENTANYL CITRATE 0.05 MG/ML IJ SOLN: 25.0000 ug | INTRAMUSCULAR | Status: DC | PRN

## 2011-01-07 MED ORDER — PROMETHAZINE HCL 25 MG PO TABS: 25.0000 mg | ORAL_TABLET | Freq: Four times a day (QID) | ORAL | Status: AC | PRN

## 2011-01-07 MED ORDER — HYDROCODONE-ACETAMINOPHEN 7.5-325 MG PO TABS: 1.0000 | ORAL_TABLET | Freq: Four times a day (QID) | ORAL | Status: AC | PRN

## 2011-01-07 MED ORDER — ONDANSETRON HCL 4 MG/2ML IJ SOLN: 4.0000 mg | Freq: Once | INTRAMUSCULAR | Status: DC | PRN

## 2011-01-07 MED ORDER — ACETAMINOPHEN 325 MG PO TABS: 325.0000 mg | ORAL_TABLET | ORAL | Status: DC | PRN

## 2011-01-07 MED ORDER — PHYSOSTIGMINE SALICYLATE 1 MG/ML IJ SOLN
INTRAMUSCULAR | Status: DC | PRN
  Administered 2011-01-07: 1 mg via INTRAVENOUS

## 2011-01-07 MED ORDER — CELECOXIB 100 MG PO CAPS
ORAL_CAPSULE | ORAL | Status: AC
  Administered 2011-01-07: 400 mg via ORAL
  Filled 2011-01-07: qty 4

## 2011-01-07 MED ORDER — NEOSTIGMINE METHYLSULFATE 1 MG/ML IJ SOLN
INTRAMUSCULAR | Status: DC | PRN
  Administered 2011-01-07: 4 mg via INTRAMUSCULAR

## 2011-01-07 MED ORDER — LACTATED RINGERS IV SOLN: INTRAVENOUS | Status: DC

## 2011-01-07 MED ORDER — PROPOFOL 10 MG/ML IV EMUL
INTRAVENOUS | Status: AC
  Filled 2011-01-07: qty 20

## 2011-01-07 MED ORDER — BUPIVACAINE HCL (PF) 0.5 % IJ SOLN
INTRAMUSCULAR | Status: AC
  Filled 2011-01-07: qty 60

## 2011-01-07 MED ORDER — EPINEPHRINE HCL 1 MG/ML IJ SOLN
INTRAMUSCULAR | Status: AC
  Filled 2011-01-07: qty 10

## 2011-01-07 MED ORDER — HYDROCODONE-ACETAMINOPHEN 5-325 MG PO TABS
ORAL_TABLET | ORAL | Status: AC
  Filled 2011-01-07: qty 1

## 2011-01-07 MED ORDER — ACETAMINOPHEN 500 MG PO TABS
500.0000 mg | ORAL_TABLET | Freq: Once | ORAL | Status: AC
  Administered 2011-01-07: 500 mg via ORAL

## 2011-01-07 MED ORDER — ROCURONIUM BROMIDE 50 MG/5ML IV SOLN
INTRAVENOUS | Status: AC
  Filled 2011-01-07: qty 1

## 2011-01-07 MED ORDER — LACTATED RINGERS IV SOLN
INTRAVENOUS | Status: DC
  Administered 2011-01-07: 11:00:00 via INTRAVENOUS

## 2011-01-07 MED ORDER — METHOCARBAMOL 500 MG PO TABS: 500.0000 mg | ORAL_TABLET | Freq: Four times a day (QID) | ORAL | Status: AC

## 2011-01-07 MED ORDER — ONDANSETRON HCL 4 MG/2ML IJ SOLN
INTRAMUSCULAR | Status: AC
  Administered 2011-01-07: 4 mg via INTRAVENOUS
  Filled 2011-01-07: qty 2

## 2011-01-07 MED ORDER — IBUPROFEN 800 MG PO TABS: 800.0000 mg | ORAL_TABLET | Freq: Three times a day (TID) | ORAL | Status: AC | PRN

## 2011-01-07 MED ORDER — HYDROCODONE-ACETAMINOPHEN 5-325 MG PO TABS
1.0000 | ORAL_TABLET | Freq: Once | ORAL | Status: AC
  Administered 2011-01-07: 1 via ORAL

## 2011-01-07 MED ORDER — BUPIVACAINE HCL (PF) 0.25 % IJ SOLN
INTRAMUSCULAR | Status: AC
  Filled 2011-01-07: qty 270

## 2011-01-07 MED ORDER — EPINEPHRINE HCL 1 MG/ML IJ SOLN
INTRAMUSCULAR | Status: DC | PRN
  Administered 2011-01-07: 13:00:00

## 2011-01-07 MED ORDER — CEFAZOLIN SODIUM 1-5 GM-% IV SOLN
INTRAVENOUS | Status: AC
  Filled 2011-01-07: qty 50

## 2011-01-07 MED ORDER — CEFAZOLIN SODIUM 1-5 GM-% IV SOLN
1.0000 g | INTRAVENOUS | Status: AC
  Administered 2011-01-07: 1 g via INTRAVENOUS

## 2011-01-07 MED ORDER — METHOCARBAMOL 100 MG/ML IJ SOLN
500.0000 mg | Freq: Once | INTRAVENOUS | Status: AC
  Administered 2011-01-07: 500 mg via INTRAVENOUS
  Filled 2011-01-07: qty 5

## 2011-01-07 MED ORDER — FENTANYL CITRATE 0.05 MG/ML IJ SOLN
INTRAMUSCULAR | Status: AC
  Filled 2011-01-07: qty 5

## 2011-01-07 MED ORDER — FENTANYL CITRATE 0.05 MG/ML IJ SOLN
INTRAMUSCULAR | Status: DC | PRN
  Administered 2011-01-07 (×9): 50 ug via INTRAVENOUS

## 2011-01-07 MED ORDER — MIDAZOLAM HCL 2 MG/2ML IJ SOLN
INTRAMUSCULAR | Status: AC
  Administered 2011-01-07: 2 mg
  Filled 2011-01-07: qty 2

## 2011-01-07 MED ORDER — BUPIVACAINE-EPINEPHRINE PF 0.5-1:200000 % IJ SOLN
INTRAMUSCULAR | Status: AC
  Filled 2011-01-07: qty 20

## 2011-01-07 MED ORDER — PROPOFOL 10 MG/ML IV EMUL
INTRAVENOUS | Status: DC | PRN
  Administered 2011-01-07: 30 mg via INTRAVENOUS
  Administered 2011-01-07: 50 mg via INTRAVENOUS
  Administered 2011-01-07: 120 mg via INTRAVENOUS

## 2011-01-07 MED ORDER — CELECOXIB 100 MG PO CAPS
400.0000 mg | ORAL_CAPSULE | Freq: Once | ORAL | Status: AC
  Administered 2011-01-07: 400 mg via ORAL

## 2011-01-07 MED ORDER — GLYCOPYRROLATE 0.2 MG/ML IJ SOLN
INTRAMUSCULAR | Status: DC | PRN
  Administered 2011-01-07: .6 mg via INTRAVENOUS

## 2011-01-07 MED ORDER — FLUMAZENIL 0.5 MG/5ML IV SOLN
INTRAVENOUS | Status: DC | PRN
  Administered 2011-01-07: .1 mg via INTRAVENOUS

## 2011-01-07 MED ORDER — MIDAZOLAM HCL 2 MG/2ML IJ SOLN: 1.0000 mg | INTRAMUSCULAR | Status: DC | PRN

## 2011-01-07 SURGICAL SUPPLY — 97 items
ANCHOR CORKSCREW 5.0 FIBERWIRE (Anchor) ×2 IMPLANT
BAG HAMPER (MISCELLANEOUS) ×2 IMPLANT
BLADE AGGRESSIVE PLUS 4.0 (BLADE) ×2 IMPLANT
BLADE AVERAGE 25X9 (BLADE) IMPLANT
BLADE SURG SZ10 CARB STEEL (BLADE) ×2 IMPLANT
BLADE SURG SZ11 CARB STEEL (BLADE) ×2 IMPLANT
BUR 5.0 BARRELL (BURR) IMPLANT
BUR BARRELL 4.0 (BURR) IMPLANT
BUR FAST CUTTING (BURR) ×1
BUR ROUND 5.0 (BURR) IMPLANT
BUR SRGRND 54.5X3.2X8 (BURR) ×1 IMPLANT
BURR SRGRND 54.5X3.2X8 (BURR) ×1
CANNULA DRILOCK 5.0X75 (CANNULA) ×2 IMPLANT
CANNULA DRILOCK 8.0X75 (CANNULA) IMPLANT
CATH KIT ON Q 2.5IN SLV (PAIN MANAGEMENT) ×2 IMPLANT
CHLORAPREP W/TINT 26ML (MISCELLANEOUS) ×2 IMPLANT
CLOTH BEACON ORANGE TIMEOUT ST (SAFETY) ×2 IMPLANT
COOLER CRYO CUFF IC AND MOTOR (MISCELLANEOUS) IMPLANT
COVER LIGHT HANDLE STERIS (MISCELLANEOUS) ×4 IMPLANT
COVER PROBE W GEL 5X96 (DRAPES) IMPLANT
CUFF CRYO UNI SHDR 32X48 (MISCELLANEOUS) IMPLANT
CUTTER LINEAR ENDO 35 ETS TH (STAPLE) ×2 IMPLANT
DECANTER SPIKE VIAL GLASS SM (MISCELLANEOUS) ×4 IMPLANT
DRAPE PROXIMA HALF (DRAPES) ×2 IMPLANT
DRAPE SHOULDER BEACH CHAIR (DRAPES) ×2 IMPLANT
DRAPE U-SHAPE 47X51 STRL (DRAPES) ×2 IMPLANT
DRESSING ALLEVYN BORDER HEEL (GAUZE/BANDAGES/DRESSINGS) ×2 IMPLANT
ELECT REM PT RETURN 9FT ADLT (ELECTROSURGICAL) ×2
ELECTRODE REM PT RTRN 9FT ADLT (ELECTROSURGICAL) ×1 IMPLANT
FACESHIELD STD STERILE (MASK) IMPLANT
FIBERSTICK 2 (SUTURE) IMPLANT
FLOOR PAD 36X40 (MISCELLANEOUS) ×2
GAUZE SPONGE 4X4 16PLY XRAY LF (GAUZE/BANDAGES/DRESSINGS) ×2 IMPLANT
GAUZE XEROFORM 5X9 LF (GAUZE/BANDAGES/DRESSINGS) IMPLANT
GLOVE BIOGEL PI IND STRL 7.5 (GLOVE) ×1 IMPLANT
GLOVE BIOGEL PI INDICATOR 7.5 (GLOVE) ×1
GLOVE ECLIPSE 6.5 STRL STRAW (GLOVE) ×2 IMPLANT
GLOVE ECLIPSE 7.0 STRL STRAW (GLOVE) ×2 IMPLANT
GLOVE EXAM NITRILE PF LG BLUE (GLOVE) ×2 IMPLANT
GLOVE INDICATOR 7.0 STRL GRN (GLOVE) ×2 IMPLANT
GLOVE SKINSENSE NS SZ8.0 LF (GLOVE) ×1
GLOVE SKINSENSE STRL SZ8.0 LF (GLOVE) ×1 IMPLANT
GLOVE SS N UNI LF 8.5 STRL (GLOVE) ×2 IMPLANT
GOWN BRE IMP SLV AUR XL STRL (GOWN DISPOSABLE) ×4 IMPLANT
GOWN STRL REIN XL XLG (GOWN DISPOSABLE) ×2 IMPLANT
IV NS IRRIG 3000ML ARTHROMATIC (IV SOLUTION) ×12 IMPLANT
KIT BLADEGUARD II DBL (SET/KITS/TRAYS/PACK) ×2 IMPLANT
KIT POSITION SHOULDER SCHLEI (MISCELLANEOUS) ×2 IMPLANT
KIT ROOM TURNOVER APOR (KITS) ×2 IMPLANT
MANIFOLD NEPTUNE II (INSTRUMENTS) ×2 IMPLANT
MARKER SKIN DUAL TIP RULER LAB (MISCELLANEOUS) ×2 IMPLANT
NEEDLE HYPO 21X1.5 SAFETY (NEEDLE) ×2 IMPLANT
NEEDLE MAYO 6 CRC TAPER PT (NEEDLE) ×2 IMPLANT
NEEDLE SCORPION (NEEDLE) IMPLANT
NEEDLE SPNL 18GX3.5 QUINCKE PK (NEEDLE) ×2 IMPLANT
NS IRRIG 1000ML POUR BTL (IV SOLUTION) ×2 IMPLANT
PACK BASIC III (CUSTOM PROCEDURE TRAY) ×1
PACK SRG BSC III STRL LF ECLPS (CUSTOM PROCEDURE TRAY) ×1 IMPLANT
PAD ABD 5X9 TENDERSORB (GAUZE/BANDAGES/DRESSINGS) IMPLANT
PAD ARMBOARD 7.5X6 YLW CONV (MISCELLANEOUS) ×2 IMPLANT
PAD FLOOR 36X40 (MISCELLANEOUS) ×1 IMPLANT
PAIN PUMP ON-Q 270MLX5ML 2.5IN (PAIN MANAGEMENT) ×2 IMPLANT
PASSER SUT CAPTURE FIRST (SUTURE) IMPLANT
PENCIL HANDSWITCHING (ELECTRODE) ×2 IMPLANT
PUSHLOCK PEEK 4.5X24 (Orthopedic Implant) ×2 IMPLANT
RASP SM TEAR CROSS CUT (RASP) IMPLANT
SET ARTHROSCOPY INST (INSTRUMENTS) ×2 IMPLANT
SET BASIN LINEN APH (SET/KITS/TRAYS/PACK) ×2 IMPLANT
SLING ARM FOAM STRAP MED (SOFTGOODS) ×2 IMPLANT
SPONGE GAUZE 4X4 12PLY (GAUZE/BANDAGES/DRESSINGS) IMPLANT
STOCKINETTE IMPERVIOUS LG (DRAPES) ×2 IMPLANT
STRIP CLOSURE SKIN 1/2X4 (GAUZE/BANDAGES/DRESSINGS) IMPLANT
SUT BONE WAX W31G (SUTURE) IMPLANT
SUT ETHIBOND NAB OS 4 #2 30IN (SUTURE) ×2 IMPLANT
SUT FIBERWIRE #2 38 REV NDL BL (SUTURE)
SUT FIBERWIRE #2 38 T-5 BLUE (SUTURE)
SUT LASSO 45 DEGREE (SUTURE) IMPLANT
SUT LASSO 45 DEGREE LEFT (SUTURE) IMPLANT
SUT LASSO 45D RIGHT (SUTURE) IMPLANT
SUT MNCRL 0 VIOLET CTX 36 (SUTURE) IMPLANT
SUT MON AB 0 CT1 (SUTURE) ×2 IMPLANT
SUT MON AB 2-0 CT1 36 (SUTURE) IMPLANT
SUT MONOCRYL 0 CTX 36 (SUTURE)
SUT PROLENE 2 0 FS (SUTURE) ×2 IMPLANT
SUT PROLENE 2 0 SH 30 (SUTURE) IMPLANT
SUT PROLENE 3 0 PS 1 (SUTURE) ×2 IMPLANT
SUT VIC AB 1 CT1 27 (SUTURE) ×2
SUT VIC AB 1 CT1 27XBRD ANTBC (SUTURE) ×2 IMPLANT
SUTURE FIBERWR #2 38 T-5 BLUE (SUTURE) IMPLANT
SUTURE FIBERWR#2 38 REV NDL BL (SUTURE) IMPLANT
SYR 30ML LL (SYRINGE) ×2 IMPLANT
SYR BULB IRRIGATION 50ML (SYRINGE) IMPLANT
TAPE MEDIFIX FOAM 3 (GAUZE/BANDAGES/DRESSINGS) IMPLANT
TOWEL OR 17X26 4PK STRL BLUE (TOWEL DISPOSABLE) ×2 IMPLANT
TUBING ARTHROSCOPY INFLOW/OUT (IRRIGATION / IRRIGATOR) ×2 IMPLANT
WAND 90 DEG TURBOVAC W/CORD (SURGICAL WAND) ×2 IMPLANT
YANKAUER SUCT BULB TIP 10FT TU (MISCELLANEOUS) ×4 IMPLANT

## 2011-01-07 NOTE — Anesthesia Procedure Notes (Signed)
Procedure Name: Intubation Date/Time: 01/07/2011 12:27 PM Performed by: Minerva Areola Pre-anesthesia Checklist: Patient identified, Patient being monitored, Timeout performed, Emergency Drugs available and Suction available Patient Re-evaluated:Patient Re-evaluated prior to inductionOxygen Delivery Method: Circle System Utilized Preoxygenation: Pre-oxygenation with 100% oxygen Intubation Type: IV induction Ventilation: Mask ventilation without difficulty Laryngoscope Size: Miller and 2 Grade View: Grade I Tube type: Oral Tube size: 7.0 mm Number of attempts: 1 Airway Equipment and Method: stylet Placement Confirmation: ETT inserted through vocal cords under direct vision,  positive ETCO2 and breath sounds checked- equal and bilateral Secured at: 20 cm Tube secured with: Tape Dental Injury: Teeth and Oropharynx as per pre-operative assessment

## 2011-01-07 NOTE — Brief Op Note (Signed)
01/07/2011  2:34 PM  PATIENT:  Devin [name]  65 y.o. male  PRE-OPERATIVE DIAGNOSIS:  Right Rotator Cuff Tear  POST-OPERATIVE DIAGNOSIS:  Torn Biceps Tendon; Synovitis Right Shoulder Joint; AC Joint Arthritis; Partial Right Rotator Cuff Tear  PROCEDURE:  Procedure(s): RIGHT SHOULDER ARTHROSCOPY WITH BICEPS TENOTOMY SYNOVECTOMY SHOULDER JOINT OPEN DISTAL CLAVICLE EXCISION OPEN ACROMIOPLASTY OPEN ROTATOR CUFF REPAIR AND DISTAL CLAVICLE ACROMINECTOMY  SURGEON:  Surgeon(s): Fuller Canada, MD  PHYSICIAN ASSISTANT:   ASSISTANTS: BETTY ASHLEY   ANESTHESIA:   general  ESTIMATED BLOOD LOSS: * No blood loss amount entered *   BLOOD ADMINISTERED:none  DRAINS: none   LOCAL MEDICATIONS USED:  MARCAINE 60CC  SPECIMEN:  Source of Specimen:  SHOULDER ACROMION   DISPOSITION OF SPECIMEN:  PATHOLOGY  COUNTS:  YES  TOURNIQUET:  * No tourniquets in log *  DICTATION #:   PLAN OF CARE: PACU THEN HOME   PATIENT DISPOSITION:  PACU - hemodynamically stable.   Delay start of Pharmacological VTE agent (>24hrs) due to surgical blood loss or risk of bleeding:  yes

## 2011-01-07 NOTE — H&P (View-Only) (Signed)
Devin [name] is an 65 y.o. male.   Chief Complaint: right shoulder pain  HPI: the patient Complains of continued RIGHT shoulder pain despite multiple injections, physical therapy, activity modification. Is not improved with nonsurgical measures and now presents for arthroscopy. RIGHT shoulder with mini open rotator cuff repair. His MRI shows that he has multiple problems in his RIGHT shoulder, including degenerative changes of the tendon, spur formation. Degenerative changes in the joint. There is tearing, which appears to be partial of the cuff and an assessment will be made at the time of surgery, whether it needs to be repaired, but most likely, it will.    Past Medical History  Diagnosis Date  . Coronary atherosclerosis of artery bypass graft   . Personal history of unspecified circulatory disease   . 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 Chest pain, unspecified   . Other and unspecified hyperlipidemia   . TIA (transient ischemic attack)   . Carpal tunnel syndrome   . Asthma   . Shortness of breath   . Chronic kidney disease     hx of kidney stones  . Arthritis   . HOH (hard of hearing)     Past Surgical History  Procedure Date  . Neck surgery   . Carpal tunnel release     blateral carpal tunnel  . Coronary artery bypass graft     x5 12 yrs ago  . Back surgery     cervical disc    Family History  Problem Relation Age of Onset  . Heart disease    . Arthritis    . Asthma    . Coronary artery disease    . Anesthesia problems Neg Hx   . Hypotension Neg Hx   . Malignant hyperthermia Neg Hx   . Pseudochol deficiency Neg Hx    Social History:  reports that he has been smoking Cigars and Cigarettes.  He has a 50 pack-year smoking history. He does not have any smokeless tobacco history on file. He reports that he does not drink alcohol or use illicit drugs.  Allergies:  Allergies  Allergen Reactions  . Aspirin Other (See  Comments)    Has to take the coated aspirin    Medications Prior to Admission  Medication Sig Dispense Refill  . aspirin (ASPIRIN LOW DOSE) 81 MG EC tablet Take 81 mg by mouth daily.        . clopidogrel (PLAVIX) 75 MG tablet Take 75 mg by mouth daily.       . dorzolamide-timolol (COSOPT) 22.3-6.8 MG/ML ophthalmic solution 1 drop as directed.        . latanoprost (XALATAN) 0.005 % ophthalmic solution Place 1 drop into both eyes daily.       . nitroGLYCERIN (NITROSTAT) 0.4 MG SL tablet Place 0.4 mg under the tongue. 1 tablet under the tongue at onset of chest pain; you may repeat every 5 mins for up to 3 doses      . omeprazole (PRILOSEC OTC) 20 MG tablet Take 20 mg by mouth daily.        Marland Kitchen PROAIR HFA 108 (90 BASE) MCG/ACT inhaler Inhale 1 puff into the lungs every 4 (four) hours as needed. Shortness of Breath      . simvastatin (ZOCOR) 40 MG tablet Take 40 mg by mouth at bedtime.        . traMADol-acetaminophen (ULTRACET) 37.5-325 MG per tablet Take 1 tablet by mouth every 6 (  six) hours as needed.         Medications Prior to Admission  Medication Dose Route Frequency Provider Last Rate Last Dose  . methylPREDNISolone acetate (DEPO-MEDROL) injection 40 mg  40 mg Intra-articular Once Fuller Canada, MD      . methylPREDNISolone acetate (DEPO-MEDROL) injection 40 mg  40 mg Intra-articular Once Fuller Canada, MD      . DISCONTD: lactated ringers infusion   Intravenous Continuous Laurene Footman        Results for orders placed during the hospital encounter of 01/03/11 (from the past 48 hour(s))  SURGICAL PCR SCREEN     Status: Normal   Collection Time   01/03/11  1:46 PM      Component Value Range Comment   MRSA, PCR NEGATIVE  NEGATIVE     Staphylococcus aureus NEGATIVE  NEGATIVE    BASIC METABOLIC PANEL     Status: Abnormal   Collection Time   01/03/11  2:00 PM      Component Value Range Comment   Sodium 139  135 - 145 (mEq/L)    Potassium 3.8  3.5 - 5.1 (mEq/L)    Chloride 104  96 -  112 (mEq/L)    CO2 21  19 - 32 (mEq/L)    Glucose, Bld 115 (*) 70 - 99 (mg/dL)    BUN 10  6 - 23 (mg/dL)    Creatinine, Ser 4.09  0.50 - 1.35 (mg/dL)    Calcium 9.0  8.4 - 10.5 (mg/dL)    GFR calc non Af Amer >60  >60 (mL/min)    GFR calc Af Amer >60  >60 (mL/min)   CBC     Status: Abnormal   Collection Time   01/03/11  2:00 PM      Component Value Range Comment   WBC 9.1  4.0 - 10.5 (K/uL)    RBC 4.14 (*) 4.22 - 5.81 (MIL/uL)    Hemoglobin 14.0  13.0 - 17.0 (g/dL)    HCT 81.1  91.4 - 78.2 (%)    MCV 97.3  78.0 - 100.0 (fL)    MCH 33.8  26.0 - 34.0 (pg)    MCHC 34.7  30.0 - 36.0 (g/dL)    RDW 95.6  21.3 - 08.6 (%)    Platelets 151  150 - 400 (K/uL)    @[id number] @  Review of Systems  All other systems reviewed and are negative.    There were no vitals taken for this visit. Physical Exam  Constitutional: He is oriented to person, place, and time. He appears well-developed and well-nourished.  HENT:  Head: Normocephalic and atraumatic.  Eyes: Pupils are equal, round, and reactive to light.  Neck: Normal range of motion. Neck supple.  Cardiovascular: Normal rate and regular rhythm.   Respiratory: Effort normal and breath sounds normal.  GI: Soft. Bowel sounds are normal.  Musculoskeletal:       Right shoulder: He exhibits decreased range of motion, tenderness, bony tenderness, swelling and crepitus.       Left shoulder: Normal.       Right elbow: Normal.      Left elbow: Normal.       Right wrist: Normal.       Left wrist: Normal.  Lymphadenopathy:    He has no cervical adenopathy.    He has no axillary adenopathy.  Neurological: He is alert and oriented to person, place, and time. He has normal reflexes. He displays no atrophy. No cranial nerve deficit or sensory  deficit. He exhibits normal muscle tone.  Skin: Skin is warm and dry. No abrasion and no rash noted.  Psychiatric: He has a normal mood and affect. His speech is normal and behavior is normal. Judgment and  thought content normal. Cognition and memory are normal.     Assessment/Plan SARS, Mini open rotator cuff repair   Fuller Canada 01/05/2011, 11:59 AM

## 2011-01-07 NOTE — Transfer of Care (Signed)
Immediate Anesthesia Transfer of Care Note  Patient: Devin [name]  Procedure(s) Performed:  SHOULDER ARTHROSCOPY WITH OPEN ROTATOR CUFF REPAIR AND DISTAL CLAVICLE ACROMINECTOMY - with Biceps Tenotomy; Synovectomy; Acromioplasty  Patient Location: PACU  Anesthesia Type: General  Level of Consciousness: awake, alert , oriented and patient cooperative  Airway & Oxygen Therapy: Patient Spontanous Breathing  Post-op Assessment: Report given to PACU RN, Post -op Vital signs reviewed and stable and Patient moving all extremities  Post vital signs: stable  Complications: No apparent anesthesia complications

## 2011-01-07 NOTE — Interval H&P Note (Signed)
History and Physical Interval Note:   01/07/2011   10:54 AM   Devin [name]  has presented today for surgery, with the diagnosis of Right Rotator Cuff Tear  The various methods of treatment have been discussed with the patient and family. After consideration of risks, benefits and other options for treatment, the patient has consented to  Procedure(s): RIGHT SHOULDER ARTHROSCOPY WITH OPEN ROTATOR CUFF REPAIR AND DISTAL CLAVICLE EXCISION  as a surgical intervention .  I have reviewed the patients' chart and labs.  Questions were answered to the patient's satisfaction.     Fuller Canada  MD

## 2011-01-07 NOTE — Anesthesia Preprocedure Evaluation (Addendum)
Anesthesia Evaluation  Name, MR# and DOB Patient awake  General Assessment Comment  Reviewed: Allergy & Precautions, H&P , NPO status , Patient's Chart, lab work & pertinent test results  History of Anesthesia Complications Negative for: history of anesthetic complications  Airway Mallampati: II TM Distance: >3 FB Neck ROM: Full    Dental  (+) Edentulous Upper and Edentulous Lower   Pulmonary (+) shortness of breath and With exertion asthma    pulmonary exam normalPulmonary Exam Normal     Cardiovascular + CAD and + CABG IRegular Normal    Neuro/Psych TIA (on Plavix) Neuromuscular disease Negative Psych ROS  GI/Hepatic/Renal (+)  GERD Medicated and Controlled     Endo/Other  Negative Endocrine ROS (+)      Abdominal Normal abdominal exam  (+)   Musculoskeletal negative musculoskeletal ROS (+)   Hematology negative hematology ROS (+)   Peds  Reproductive/Obstetrics    Anesthesia Other Findings glaucoma         Anesthesia Physical Anesthesia Plan  ASA: III  Anesthesia Plan: General   Post-op Pain Management:    Induction: Intravenous  Airway Management Planned: Oral ETT  Additional Equipment:   Intra-op Plan:   Post-operative Plan: Extubation in OR  Informed Consent: I have reviewed the patients History and Physical, chart, labs and discussed the procedure including the risks, benefits and alternatives for the proposed anesthesia with the patient or authorized representative who has indicated his/her understanding and acceptance.     Plan Discussed with: CRNA  Anesthesia Plan Comments:         Anesthesia Quick Evaluation

## 2011-01-07 NOTE — Op Note (Signed)
Preop diagnosis rotator cuff tear right shoulder, a.c. joint arthrosis  Postop diagnosis rotator cuff tear right shoulder, a.c. joint arthritis, synovitis glenohumeral joint, torn biceps tendon.  Procedure arthroscopy right shoulder biceps tenotomy limited debridement glenohumeral joint open distal clavicle excision and open rotator cuff repair  Surgeon Romeo Apple assisted by [place]  Anesthesia Gen.  Counts were correct  No complications  Operative findings torn biceps tendon, a.c. joint arthritis, synovitis glenohumeral joint, partial rotator cuff tear undersurface.  Procedure after site marking and chart update the patient was taken to the operating room for general anesthesia he was placed in the beachchair position modified for arthroscopy on the Schlein positioner  After timeout and prepped and draped posterior portal was established and diagnostic arthroscopy which was performed at the glenohumeral joint. An anterior portal was established and a debridement of the synovial tissue which was inflamed and hypertrophic was performed. Biceps tenotomy was performed.  The scope was placed in the subacromial space and a bursectomy was performed.  The shoulder was then opened through a anterolateral incision over the acromion and a.c. joint the subcutaneous tissues were deepened down to the deltoid fascia. The deltoid fascia and trapezius was split into 2 flaps were created. Acromioplasty was performed with a saw and a rasp until smooth the edge was obtained. The distal clavicle 7 mm were removed. Bone wax was applied. The wound was irrigated. We next turned our attention to the rotator cuff. The insertion site of the supraspinatus tendon was torn from the undersurface him previously marked with a suture this area was split in line with the fibers of the supraspinatus and the greater tuberosity was debridement and to a bleeding bed and then a 5.0 Arthrex suture anchor was placed in the 2  sutures were used to tie the cuff down to its anatomic site. A #2 Ethibond suture was used to repair the split and a push lock anchor was used to bury the sutures so that they would be smooth.  The wounds were irrigated. Closure was performed by using #2 Ethibond and 0 Monocryl to close the trapezial deltoid split. We then used 0 and 2-0 Monocryl to close the subcutaneous tissue over a pain pump catheter. Prior to closing the deltoid split we injected 60 cc of Marcaine with epinephrine.  Staples were used to close the skin.  A sling was applied  The patient woke up extremely agitated and had to be sedated but was taken to recovery in stable condition  The postoperative plan is for him to be discharged home. He will followup on Monday, he will stay in his sling for 6 weeks and then he can start physical therapy.

## 2011-01-07 NOTE — Anesthesia Postprocedure Evaluation (Signed)
  Anesthesia Post-op Note  Patient: Devin [name]  Procedure(s) Performed:  SHOULDER ARTHROSCOPY WITH OPEN ROTATOR CUFF REPAIR AND DISTAL CLAVICLE ACROMINECTOMY - with Biceps Tenotomy; Synovectomy; Acromioplasty  Patient Location: PACU  Anesthesia Type: General  Level of Consciousness: awake, alert , oriented and patient cooperative  Airway and Oxygen Therapy: Patient Spontanous Breathing  Post-op Pain: mild  Post-op Assessment: Post-op Vital signs reviewed, Patient's Cardiovascular Status Stable, Respiratory Function Stable, Patent Airway, No signs of Nausea or vomiting and Pain level controlled  Post-op Vital Signs: Reviewed and stable  Complications: No apparent anesthesia complications

## 2011-01-10 ENCOUNTER — Ambulatory Visit (INDEPENDENT_AMBULATORY_CARE_PROVIDER_SITE_OTHER): Payer: BC Managed Care – PPO | Admitting: Orthopedic Surgery

## 2011-01-10 DIAGNOSIS — Z9889 Other specified postprocedural states: Secondary | ICD-10-CM | POA: Insufficient documentation

## 2011-01-10 DIAGNOSIS — M19019 Primary osteoarthritis, unspecified shoulder: Secondary | ICD-10-CM | POA: Insufficient documentation

## 2011-01-10 DIAGNOSIS — S43429A Sprain of unspecified rotator cuff capsule, initial encounter: Secondary | ICD-10-CM

## 2011-01-10 DIAGNOSIS — M66329 Spontaneous rupture of flexor tendons, unspecified upper arm: Secondary | ICD-10-CM

## 2011-01-10 DIAGNOSIS — M67919 Unspecified disorder of synovium and tendon, unspecified shoulder: Secondary | ICD-10-CM

## 2011-01-10 DIAGNOSIS — M65819 Other synovitis and tenosynovitis, unspecified shoulder: Secondary | ICD-10-CM

## 2011-01-10 DIAGNOSIS — M75101 Unspecified rotator cuff tear or rupture of right shoulder, not specified as traumatic: Secondary | ICD-10-CM

## 2011-01-10 NOTE — Patient Instructions (Signed)
Continue ice   Wear sling all the time, remove for meals   Keep ar close to body  OK to shower   No therapy at this time

## 2011-01-10 NOTE — Progress Notes (Signed)
Postoperative visit # 1   Procedure: (SARS, BICEPS TENOTOMY, DEBRIDEMENT LIMITED, OPEN DISTAL CLAVICLE EXCISION, OPEN ROT. CUFF REPAIR )  Date of procedure 01/07/2011  Diagnosis SYNOVITIS, BICEPS DEGEN TEAR, AC JOINT OA, PARTIAL ROTATOR CUFF TEAR   Operative findings Synovitis glenohumeral joint, degenerative tear biceps tendon, a.c. Joint arthrosis, partial tear rotator cuff.   Appearance of incisions NORMAL   Patient complaints none   Plan sling x 3 weeks  Staples out in ~ 2 weeks

## 2011-01-21 ENCOUNTER — Encounter: Payer: Self-pay | Admitting: Cardiology

## 2011-01-21 ENCOUNTER — Telehealth: Payer: Self-pay | Admitting: *Deleted

## 2011-01-21 NOTE — Telephone Encounter (Addendum)
Patient came in today 01/21/11 for post op 2 nurse visit to remove staples right shoulder, in sling today, patient tolerated staple removal well, no redness, swelling or drainage, applied steristrips, advised patient to keep steri strips on for a week and then remove, gave steri strips to apply if needed, patient is to come back in 4 weeks and then we will order therapy for patients right shoulder, no pain pills have been needed, patient doing well today.  DOS 01/07/11 SARS biceps tenotomy open distal clavicle excision open RCR right shoulder.

## 2011-02-09 ENCOUNTER — Other Ambulatory Visit: Payer: Self-pay | Admitting: Cardiology

## 2011-02-09 ENCOUNTER — Ambulatory Visit (INDEPENDENT_AMBULATORY_CARE_PROVIDER_SITE_OTHER): Payer: BC Managed Care – PPO | Admitting: Cardiology

## 2011-02-09 ENCOUNTER — Encounter (INDEPENDENT_AMBULATORY_CARE_PROVIDER_SITE_OTHER): Payer: BC Managed Care – PPO | Admitting: Cardiology

## 2011-02-09 ENCOUNTER — Encounter: Payer: Self-pay | Admitting: Cardiology

## 2011-02-09 DIAGNOSIS — I739 Peripheral vascular disease, unspecified: Secondary | ICD-10-CM | POA: Insufficient documentation

## 2011-02-09 DIAGNOSIS — I70219 Atherosclerosis of native arteries of extremities with intermittent claudication, unspecified extremity: Secondary | ICD-10-CM

## 2011-02-09 DIAGNOSIS — I25810 Atherosclerosis of coronary artery bypass graft(s) without angina pectoris: Secondary | ICD-10-CM

## 2011-02-09 DIAGNOSIS — Z8679 Personal history of other diseases of the circulatory system: Secondary | ICD-10-CM

## 2011-02-09 MED ORDER — NITROGLYCERIN 0.4 MG SL SUBL: 0.4000 mg | SUBLINGUAL_TABLET | SUBLINGUAL | Status: DC | PRN

## 2011-02-09 NOTE — Progress Notes (Signed)
HPI Devin [name] returns today for a violation of his history of nonobstructive carotid disease with a history of TIAs, coronary artery disease status post bypass surgery, tobacco use and other risk factors. His laboratory data is followed by Dr. [name].  He is having no  Symptoms of coronary ischemia or due to vascular ischemia. He does have claudication in the left lower extremity more so than the right which is really limiting his ability to walk faster. He continues to smoke about a pack of cigarettes a day.  EKG from August 6 shows normal sinus rhythm normal EKG. Past Medical History  Diagnosis Date  . Coronary atherosclerosis of artery bypass graft   . Personal history of unspecified circulatory disease   . 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 Chest pain, unspecified   . Other and unspecified hyperlipidemia   . TIA (transient ischemic attack)   . Carpal tunnel syndrome   . Asthma   . Shortness of breath   . Chronic kidney disease     hx of kidney stones  . Arthritis   . HOH (hard of hearing)     Past Surgical History  Procedure Date  . Neck surgery   . Carpal tunnel release     blateral carpal tunnel  . Coronary artery bypass graft     x5 12 yrs ago  . Back surgery     cervical disc    Family History  Problem Relation Age of Onset  . Heart disease    . Arthritis    . Asthma    . Coronary artery disease    . Anesthesia problems Neg Hx   . Hypotension Neg Hx   . Malignant hyperthermia Neg Hx   . Pseudochol deficiency Neg Hx     History   Social History  . Marital Status: Married    Spouse Name: N/A    Number of Children: N/A  . Years of Education: GED   Occupational History  . fulltime     Social History Main Topics  . Smoking status: Current Everyday Smoker -- 1.0 packs/day for 50 years    Types: Cigars, Cigarettes  . Smokeless tobacco: Not on file  . Alcohol Use: No  . Drug Use: No  . Sexually Active:  Yes   Other Topics Concern  . Not on file   Social History Narrative  . No narrative on file    Allergies  Allergen Reactions  . Aspirin Other (See Comments)    Has to take the coated aspirin    Current Outpatient Prescriptions  Medication Sig Dispense Refill  . aspirin (ASPIRIN LOW DOSE) 81 MG EC tablet Take 81 mg by mouth daily.        . clopidogrel (PLAVIX) 75 MG tablet Take 75 mg by mouth daily.       . dorzolamide-timolol (COSOPT) 22.3-6.8 MG/ML ophthalmic solution 1 drop as directed.        . latanoprost (XALATAN) 0.005 % ophthalmic solution Place 1 drop into both eyes daily.       . methocarbamol (ROBAXIN) 500 MG tablet as needed.      . nitroGLYCERIN (NITROSTAT) 0.4 MG SL tablet Place 0.4 mg under the tongue. 1 tablet under the tongue at onset of chest pain; you may repeat every 5 mins for up to 3 doses      . omeprazole (PRILOSEC OTC) 20 MG tablet Take 20 mg by mouth daily.        Marland Kitchen  PROAIR HFA 108 (90 BASE) MCG/ACT inhaler Inhale 1 puff into the lungs every 4 (four) hours as needed. Shortness of Breath      . simvastatin (ZOCOR) 40 MG tablet Take 40 mg by mouth at bedtime.          ROS Negative other than HPI.   PE General Appearance: well developed, well nourished in no acute distress, looks older than stated age HEENT: symmetrical face, PERRLA, good dentition  Neck: no JVD, thyromegaly, or adenopathy, trachea midline Chest: symmetric without deformity Cardiac: PMI non-displaced, RRR, normal S1, S2, no gallop or murmur Lung: decreased breath sounds throughout, no wheezing Vascular: carotid upstrokes normal, no obvious bruit. Posterior tibial 1+ over 4+ bilaterally, dorsalis pedis difficult to feel. No lesions or ulcers of his feet. Abdominal: nondistended, nontender, good bowel sounds, no HSM, no bruits Extremities: no cyanosis, clubbing or edema, no sign of DVT, no varicosities  Skin: normal color, no rashes Neuro: alert and oriented x 3, non-focal Pysch: normal  affect Filed Vitals:   02/09/11 0909  BP: 134/62  Pulse: 63  Resp: 14  Height: 5\' 4"  (1.626 m)  Weight: 136 lb (61.689 kg)    EKG  Labs and Studies Reviewed.   Lab Results  Component Value Date   WBC 9.1 01/03/2011   HGB 14.0 01/03/2011   HCT 40.3 01/03/2011   MCV 97.3 01/03/2011   PLT 151 01/03/2011      Chemistry      Component Value Date/Time   NA 139 01/03/2011 1400   K 3.8 01/03/2011 1400   CL 104 01/03/2011 1400   CO2 21 01/03/2011 1400   BUN 10 01/03/2011 1400   CREATININE 0.76 01/03/2011 1400      Component Value Date/Time   CALCIUM 9.0 01/03/2011 1400       No results found for this basename: CHOL   No results found for this basename: HDL   No results found for this basename: LDLCALC   No results found for this basename: TRIG   No results found for this basename: CHOLHDL   No results found for this basename: HGBA1C   No results found for this basename: ALT, AST, GGT, ALKPHOS, BILITOT   No results found for this basename: TSH

## 2011-02-09 NOTE — Patient Instructions (Signed)
Your physician has requested that you have a lower extremity arterial duplex. This test is an ultrasound of the arteries in the legs or arms. It looks at arterial blood flow in the legs and arms. Allow one hour for Lower and Upper Arterial scans. There are no restrictions or special instructions  Your physician recommends that you schedule a follow-up appointment in: 1 year with Dr. Daleen Squibb  Your physician discussed the hazards of tobacco use. Tobacco use cessation is recommended and techniques and options to help you quit were discussed.

## 2011-02-09 NOTE — Assessment & Plan Note (Signed)
Carotid Dopplers stable in 2011. Repeat in March of 2013

## 2011-02-09 NOTE — Assessment & Plan Note (Signed)
Stable clinically. Strong urge to quit smoking. Continue secondary prevention with medication

## 2011-02-10 ENCOUNTER — Ambulatory Visit: Payer: BC Managed Care – PPO | Admitting: Orthopedic Surgery

## 2011-02-11 ENCOUNTER — Encounter: Payer: BC Managed Care – PPO | Admitting: Cardiovascular Disease

## 2011-02-15 ENCOUNTER — Encounter: Payer: Self-pay | Admitting: Orthopedic Surgery

## 2011-02-15 ENCOUNTER — Ambulatory Visit: Payer: BC Managed Care – PPO | Admitting: Orthopedic Surgery

## 2011-02-15 ENCOUNTER — Ambulatory Visit (INDEPENDENT_AMBULATORY_CARE_PROVIDER_SITE_OTHER): Payer: BC Managed Care – PPO | Admitting: Orthopedic Surgery

## 2011-02-15 DIAGNOSIS — Z9889 Other specified postprocedural states: Secondary | ICD-10-CM

## 2011-02-15 MED ORDER — HYDROCODONE-ACETAMINOPHEN 7.5-325 MG PO TABS: 1.0000 | ORAL_TABLET | ORAL | Status: DC | PRN

## 2011-02-15 MED ORDER — HYDROCODONE-ACETAMINOPHEN 7.5-325 MG PO TABS: 1.0000 | ORAL_TABLET | Freq: Four times a day (QID) | ORAL | Status: DC | PRN

## 2011-02-15 NOTE — Progress Notes (Signed)
Postoperative visit  Procedure: (SARS, BICEPS TENOTOMY, DEBRIDEMENT LIMITED, OPEN DISTAL CLAVICLE EXCISION, OPEN ROT. CUFF REPAIR )  Date of procedure 01/07/2011  Diagnosis SYNOVITIS, BICEPS DEGEN TEAR, AC JOINT OA, PARTIAL ROTATOR CUFF TEAR  Operative findings Synovitis glenohumeral joint, degenerative tear biceps tendon, a.c. Joint arthrosis, partial tear rotator cuff.  WOUND PROBLEM  DRAINAGE AND SCAB INFERIOR WOUND EDGE  IT HAS BEEN DEBRIDED, WET TO DRY DRESSING  START OT/PT

## 2011-02-15 NOTE — Patient Instructions (Addendum)
Remove sling   Start PT /OT   Ok to drive   change dressing daily use gauze

## 2011-02-17 ENCOUNTER — Encounter: Payer: Self-pay | Admitting: Orthopedic Surgery

## 2011-02-22 ENCOUNTER — Ambulatory Visit (HOSPITAL_COMMUNITY)
Admission: RE | Admit: 2011-02-22 | Discharge: 2011-02-22 | Disposition: A | Discharge status: Home / Self Care | Payer: BC Managed Care – PPO | Source: Ambulatory Visit | Attending: Orthopedic Surgery | Admitting: Orthopedic Surgery

## 2011-02-22 DIAGNOSIS — M25519 Pain in unspecified shoulder: Secondary | ICD-10-CM | POA: Insufficient documentation

## 2011-02-22 DIAGNOSIS — M25619 Stiffness of unspecified shoulder, not elsewhere classified: Secondary | ICD-10-CM | POA: Insufficient documentation

## 2011-02-22 DIAGNOSIS — M6281 Muscle weakness (generalized): Secondary | ICD-10-CM | POA: Insufficient documentation

## 2011-02-22 DIAGNOSIS — IMO0001 Reserved for inherently not codable concepts without codable children: Secondary | ICD-10-CM | POA: Insufficient documentation

## 2011-02-22 NOTE — Progress Notes (Signed)
Occupational Therapy Evaluation  Patient Details  Name: Devin [name] MRN: [id number] Date of Birth: [birth year]  Today's Date: 02/22/2011 Time: 1025-1108 Time Calculation (min): 43 min OT Eval 1025-1045 20' Manual Therapy 1046-1100 14' HEP 1101-1108 7' Visit#: 1  of 16   Re-eval: 03/22/11  Assessment Diagnosis: S/P R RCR, SAD, SARS, Biceps Tenotomy Surgical Date: 01/07/11 Next MD Visit: 03/29/11 Prior Therapy: no MD:  Romeo Apple Past Medical History:  Past Medical History  Diagnosis Date  . Coronary atherosclerosis of artery bypass graft   . Personal history of unspecified circulatory disease   . 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 Chest pain, unspecified   . Other and unspecified hyperlipidemia   . TIA (transient ischemic attack)   . Carpal tunnel syndrome   . Asthma   . Shortness of breath   . Chronic kidney disease     hx of kidney stones  . Arthritis   . HOH (hard of hearing)    Past Surgical History:  Past Surgical History  Procedure Date  . Neck surgery   . Carpal tunnel release     blateral carpal tunnel  . Coronary artery bypass graft     x5 12 yrs ago  . Back surgery     cervical disc    Subjective Symptoms/Limitations Symptoms: S:  Ive had the same job for at least 6 years, and I think my right shoulder just wore out.   Limitations: PMH:  Patient had surgery on 01/07/11 to repair his right shoulder and biceps.  He has been referred to occupational therapy for evaluation and treatment for his right shoulder.   Pain Assessment Currently in Pain?: Yes Pain Score:   4 Pain Location: Shoulder Pain Orientation: Right Pain Type: Acute pain  Precautions/Restrictions  Precautions Precautions: Shoulder (PROM progress to AAROM progress to AROM)  Assessment ADL ADL Comments: R handed, unable to reach overhead, comb hair, brush teeth RUE Assessment RUE Assessment:  (assessed in supine, strength testing  deferred.  ER/IR 0 abd) RUE AROM (degrees) Right Shoulder Flexion  0-170: 55 Degrees Right Shoulder ABduction 0-140: 30 Degrees Right Shoulder Internal Rotation  0-70: 80 Degrees Right Shoulder External Rotation  0-90: 25 Degrees Right Elbow Flexion/Extension 0-135-150: 145  (0 extension) RUE PROM (degrees) Right Shoulder Flexion  0-170: 75 Degrees Right Shoulder ABduction 0-140: 50 Degrees Right Shoulder Internal Rotation  0-70: 80 Degrees Right Shoulder External Rotation  0-90: 40 Degrees Right Elbow Flexion/Extension 0-135-150: 150  (0 extension)  Exercise/Treatments  02/22/11 0700  Shoulder Exercises: Supine  Protraction PROM;5 reps  Horizontal ABduction PROM;5 reps  External Rotation PROM;5 reps  Internal Rotation PROM;5 reps  Flexion PROM;5 reps  ABduction PROM;5 reps   Manual Therapy Manual Therapy: Myofascial release Myofascial Release: MFR and manual stretching to right shoulder region including upper arm, biceps, scapular region, and pectoral region.  Begin scar release next visit 1046-1100  Occupational Therapy Assessment and Plan OT Assessment and Plan Clinical Impression Statement: A:  Patient presents with increased pain and fascial/scar restrictions and decreased ROM and strength in his right shoulder causing decreased I with B/IADLs, work, leisure activties. Rehab Potential: Excellent OT Frequency: Min 2X/week OT Duration: 8 weeks OT Treatment/Interventions: Self-care/ADL training;Therapeutic exercise;Manual therapy;Therapeutic activities;Patient/family education (Educated on HEP for pendulums and towel slides.) OT Plan: P:  Skilled OT to decrease pain and restrictions and increase ROM, strength, and I with all daily activities.  Treatment Plan:  MFR and manual stretching to  decrease pain and increase ROM.  Ther Ex:  Supine PROM, isometric strengthening, seated elev, ext, ret, elbow flex, ext, ball stretches, pulleys.  Progress as tolerated.   Goals Short Term  Goals (4 weeks) Short Term Goal 1: Patient will be I with HEP. Short Term Goal 2: Patient will increase PROM to [facility] in right shoulder for increased ability to comb his hair. Short Term Goal 3: Patient will increase right shoulder strength to 3+/5 for increased ability to put items into overhead cabinets. Short Term Goal 4: Patient will decrease pain to 3/10 in his right shoulder when working. Short Term Goal 5: Patient will decrease fascial restrictions from moderate to minimal in his right shoulder. Long Term Goals (8 weeks) Long Term Goal 1: Patient will return to prior level of independence with all B/IADLs, work, lesiure activities. Long Term Goal 2: Patient will increase AROM to WNL for increased independence with reaching overhead and pulling and pushing at work. Long Term Goal 3: Patient will increase strength in his right shoulder to 5/5 for increased independence pulling and pushing at work. Long Term Goal 4: Patient will decrease pain to 1/10 in his right shoulder while working. Long Term Goal 5: Patient will decrease fascial restrictions to trace in his right shoulder. End of Session Patient Active Problem List  Diagnoses  . HYPERLIPIDEMIA  . CARPAL TUNNEL SYNDROME  . CAD, ARTERY BYPASS GRAFT  . TRANSIENT ISCHEMIC ATTACKS, HX OF  . 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 Shoulder pain  . Rotator cuff syndrome  . Rotator cuff tear, right  . Arthritis, shoulder region  . Biceps tendinopathy  . Rotator cuff tendonitis  . S/P arthroscopy of shoulder  . Shoulder arthritis  . Nontraumatic rupture of tendons of biceps (long head)  . Synovitis of shoulder  . Peripheral vascular disease with claudication  . S/P shoulder surgery   End of Session Activity Tolerance: Patient tolerated treatment well General Behavior During Session: [facility] for tasks performed Cognition: [facility] for tasks performed  Time Calculation Start Time: 1025 Stop Time: 1108 Time  Calculation (min): 43 min  Shirlean Mylar, OTR/L  02/22/2011, 11:57 AM

## 2011-02-24 ENCOUNTER — Ambulatory Visit (HOSPITAL_COMMUNITY)
Admission: RE | Admit: 2011-02-24 | Discharge: 2011-02-24 | Disposition: A | Discharge status: Home / Self Care | Payer: BC Managed Care – PPO | Source: Ambulatory Visit | Attending: Family Medicine | Admitting: Family Medicine

## 2011-02-24 NOTE — Progress Notes (Signed)
Occupational Therapy Treatment  Patient Details  Name: Devin [name] MRN: [id number] Date of Birth: 22-Jul-1945  Today's Date: 02/24/2011 Time: 1020-1054 Time Calculation (min): 34 min Manual Therapy 1020-1039 19' Therapeutic Exercise 1040-1054 14' Visit#: 2  of 16   Re-eval: 03/22/11    Subjective Symptoms/Limitations Symptoms: S:  I have a few questions on the exercises for home. Limitations: Reviewed HEP with patient for reps and sessions and position for towel slides. Pain Assessment Currently in Pain?: Yes Pain Score:   1 Pain Location: Shoulder Pain Orientation: Right  O:  Exercise/Treatments  02/24/11 0700 Shoulder Exercises: Supine Protraction PROM;10 reps Horizontal ABduction PROM;10 reps External Rotation PROM;10 reps Internal Rotation PROM;10 reps Flexion PROM;10 reps ABduction PROM;10 reps Other Supine Exercises bridging x 15 reps Other Supine Exercises isometric elbow flex and ext 3" x 3 reps Shoulder Exercises: Seated Elevation AROM;10 reps Extension AROM;10 reps Row AROM;10 reps Shoulder Exercises: Pulleys Flexion 1 minute ABduction 1 minute Shoulder Exercises: Therapy Ball Flexion 10 reps ABduction 10 reps Shoulder Exercises: Isometric Strengthening Flexion Supine;3X3" Extension Supine;3X3" External Rotation Supine;3X3" Internal Rotation Supine;3X3" ABduction Supine;3X3" ADduction Supine;3X3"  Manual Therapy Manual Therapy: Other (comment) Myofascial Release: MFR and manual stretching to right shoulder region including upper arm, biceps, scapular region, and pectoral region.  4098-1191 Other Manual Therapy: scar release:  hold until bandage removed.  Occupational Therapy Assessment and Plan OT Assessment and Plan Clinical Impression Statement: A:  Patient had less pain and restrictions after MFR.   OT Plan: P:  Attempt dowel rod exercises as tolerated.   Goals Short Term Goals Short Term Goal 1: Patient will be I with HEP. Short Term  Goal 2: Patient will increase PROM to [facility] in right shoulder for increased ability to comb his hair. Short Term Goal 3: Patient will increase right shoulder strength to 3+/5 for increased ability to put items into overhead cabinets. Short Term Goal 4: Patient will decrease pain to 3/10 in his right shoulder when working. Short Term Goal 5: Patient will decrease fascial restrictions from moderate to minimal in his right shoulder. Long Term Goals Long Term Goal 1: Patient will return to prior level of independence with all B/IADLs, work, lesiure activities. Long Term Goal 2: Patient will increase AROM to WNL for increased independence with reaching overhead and pulling and pushing at work. Long Term Goal 3: Patient will increase strength in his right shoulder to 5/5 for increased independence pulling and pushing at work. Long Term Goal 4: Patient will decrease pain to 1/10 in his right shoulder while working. Long Term Goal 5: Patient will decrease fascial restrictions to trace in his right shoulder. End of Session Patient Active Problem List  Diagnoses  . HYPERLIPIDEMIA  . CARPAL TUNNEL SYNDROME  . CAD, ARTERY BYPASS GRAFT  . TRANSIENT ISCHEMIC ATTACKS, HX OF  . 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 Shoulder pain  . Rotator cuff syndrome  . Rotator cuff tear, right  . Arthritis, shoulder region  . Biceps tendinopathy  . Rotator cuff tendonitis  . S/P arthroscopy of shoulder  . Shoulder arthritis  . Nontraumatic rupture of tendons of biceps (long head)  . Synovitis of shoulder  . Peripheral vascular disease with claudication  . S/P shoulder surgery   End of Session Activity Tolerance: Patient tolerated treatment well General Behavior During Session: [facility] for tasks performed Cognition: [facility] for tasks performed   Shirlean Mylar, OTR/L  02/24/2011, 11:00 AM

## 2011-02-28 LAB — BASIC METABOLIC PANEL
CO2: 29
Calcium: 9.6
GFR calc Af Amer: >60
GFR calc non Af Amer: >60
Sodium: 140

## 2011-02-28 LAB — CBC
Hemoglobin: 14.4
MCHC: 33.8
RBC: 4.29
WBC: 8.2

## 2011-03-01 ENCOUNTER — Ambulatory Visit (HOSPITAL_COMMUNITY)
Admission: RE | Admit: 2011-03-01 | Discharge: 2011-03-01 | Disposition: A | Discharge status: Home / Self Care | Payer: BC Managed Care – PPO | Source: Ambulatory Visit | Attending: Orthopedic Surgery | Admitting: Orthopedic Surgery

## 2011-03-01 DIAGNOSIS — M6281 Muscle weakness (generalized): Secondary | ICD-10-CM | POA: Insufficient documentation

## 2011-03-01 DIAGNOSIS — M25619 Stiffness of unspecified shoulder, not elsewhere classified: Secondary | ICD-10-CM | POA: Insufficient documentation

## 2011-03-01 DIAGNOSIS — M25519 Pain in unspecified shoulder: Secondary | ICD-10-CM | POA: Insufficient documentation

## 2011-03-01 DIAGNOSIS — IMO0001 Reserved for inherently not codable concepts without codable children: Secondary | ICD-10-CM | POA: Insufficient documentation

## 2011-03-01 NOTE — Progress Notes (Addendum)
Occupational Therapy Treatment  Patient Details  Name: Devin [name] MRN: [id number] Date of Birth: April 25, 1946  Today's Date: 03/01/2011 Time: 2130-8657 Visit#: 3  of 16   Re-eval: 03/22/11 Manual therapy 1022-1048 26' Therapeutic Exercise 8469-6295   Subjective Symptoms/Limitations Symptoms: I feel like my shoulder wants to get stiff this morning. Pain Assessment Currently in Pain?: Yes Pain Score:   2 Pain Location: Shoulder Pain Orientation: Right Pain Type: Acute pain   Exercise/Treatments Supine Protraction: PROM;AAROM;10 reps Horizontal ABduction: PROM;AAROM;10 reps External Rotation: PROM;AAROM;10 reps Internal Rotation: PROM;AAROM;10 reps Flexion: PROM;AAROM;10 reps ABduction: PROM;AAROM;10 reps Other Supine Exercises: bridging x 15 reps Other Supine Exercises: isometric elbow flex and ext 3" x 3 reps Seated Elevation: AROM;10 reps Extension: AROM;10 reps Row: AROM;10 reps Pulleys Flexion: 1 minute ABduction: 1 minute Therapy Ball Flexion: 20 reps ABduction: 20 reps ROM / Strengthening / Isometric Strengthening   Flexion: Supine;3X3" Extension: Supine;3X3" External Rotation: Supine;3X3" Internal Rotation: Supine;3X3" ABduction: Supine;3X3" ADduction: Supine;3X3"   Manual Therapy Manual Therapy: Myofascial release Myofascial Release: MFR and manual stretching to right shoulder region including upper arm, biceps, scapular region, and pectoral region. Begin scar release next visit  Occupational Therapy Assessment and Plan OT Assessment and Plan Clinical Impression Statement: Decreased  c/o stiffness at end of session.  Patient without bandage but had small scab on scar, mild clear drainage.  Instructed pt. to keep area clean with soap and water and keep covered.  Added dowel ex. Rehab Potential: Excellent OT Plan: Increase reps as patient tolerates.  Monitor scar area for drainage.   Goals Short Term Goals Short Term Goal 1: Patient will be I  with HEP. Short Term Goal 2: Patient will increase PROM to [facility] in right shoulder for increased ability to comb his hair. Short Term Goal 3: Patient will increase right shoulder strength to 3+/5 for increased ability to put items into overhead cabinets. Short Term Goal 4: Patient will decrease pain to 3/10 in his right shoulder when working. Short Term Goal 5: Patient will decrease fascial restrictions from moderate to minimal in his right shoulder. Long Term Goals Long Term Goal 1: Patient will return to prior level of independence with all B/IADLs, work, lesiure activities. Long Term Goal 2: Patient will increase AROM to WNL for increased independence with reaching overhead and pulling and pushing at work. Long Term Goal 3: Patient will increase strength in his right shoulder to 5/5 for increased independence pulling and pushing at work. Long Term Goal 4: Patient will decrease pain to 1/10 in his right shoulder while working. Long Term Goal 5: Patient will decrease fascial restrictions to trace in his right shoulder. End of Session Patient Active Problem List  Diagnoses  . HYPERLIPIDEMIA  . CARPAL TUNNEL SYNDROME  . CAD, ARTERY BYPASS GRAFT  . TRANSIENT ISCHEMIC ATTACKS, HX OF  . 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 Shoulder pain  . Rotator cuff syndrome  . Rotator cuff tear, right  . Arthritis, shoulder region  . Biceps tendinopathy  . Rotator cuff tendonitis  . S/P arthroscopy of shoulder  . Shoulder arthritis  . Nontraumatic rupture of tendons of biceps (long head)  . Synovitis of shoulder  . Peripheral vascular disease with claudication  . S/P shoulder surgery   End of Session Activity Tolerance: Patient tolerated treatment well   Tashera Montalvo L. Oswell Say, COTA/L  03/01/2011, 12:01 PM

## 2011-03-03 ENCOUNTER — Ambulatory Visit (HOSPITAL_COMMUNITY)
Admission: RE | Admit: 2011-03-03 | Discharge: 2011-03-03 | Disposition: A | Discharge status: Home / Self Care | Payer: BC Managed Care – PPO | Source: Ambulatory Visit | Attending: Occupational Therapy | Admitting: Occupational Therapy

## 2011-03-03 NOTE — Progress Notes (Signed)
Occupational Therapy Treatment  Patient Details  Name: Devin [name] MRN: [id number] Date of Birth: [birth year]  Today's Date: 03/03/2011 Time: 4098-1191 Time Calculation (min): 47 min Manual Therapy 4782-9562 22' Therapeutic Exercises 1126-1150 24' Visit#: 4  of 16   Re-eval: 03/22/11    Subjective Symptoms/Limitations Symptoms: S:  It was sore last night the way I slept on it. Limitations: no visible drainage from incision. Pain Assessment Currently in Pain?: Yes Pain Score:   1 Pain Location: Shoulder Pain Orientation: Right Pain Type: Acute pain  Precautions/Restrictions     Mobility       Exercise/Treatments  03/03/11 0700  Shoulder Exercises: Supine  Protraction PROM;AAROM;15 reps  Horizontal ABduction PROM;AAROM;15 reps  External Rotation PROM;AAROM;15 reps  Internal Rotation PROM;AAROM;15 reps  Flexion PROM;AAROM;15 reps  ABduction PROM;AAROM;15 reps  Other Supine Exercises dc  Other Supine Exercises dc  Shoulder Exercises: Seated  Elevation AROM;15 reps  Extension AROM;15 reps  Retraction AROM;15 reps  Row AROM;15 reps  Shoulder Exercises: Therapy Ball  Flexion 25 reps  ABduction 25 reps  Shoulder Exercises: ROM/Strengthening  Wall Wash low x 1 min  Shoulder Exercises: Isometric Strengthening  Flexion Theraband;3X3" (red)  Extension Theraband;3X3" (red)  External Rotation Theraband;3X3" (red)  Internal Rotation Theraband;3X3" (red)  ABduction Theraband;3X3" (red)  ADduction Theraband;3X3" (red)   Manual Therapy Manual Therapy: Myofascial release Myofascial Release: MFR and manual stretching to right shoulder region including upper arm, biceps, scapular region, and pectoral region.  Scar healed, begin scar release next visit. 1308-6578  Occupational Therapy Assessment and Plan OT Assessment and Plan Clinical Impression Statement: A:  transitioned to isometric strengthening with tband.  added low wall wash. OT Plan: P:  add AROM in  seated.   Goals Short Term Goals Short Term Goal 1: Patient will be I with HEP. Short Term Goal 1 Progress: Progressing toward goal Short Term Goal 2: Patient will increase PROM to [facility] in right shoulder for increased ability to comb his hair. Short Term Goal 2 Progress: Progressing toward goal Short Term Goal 3: Patient will increase right shoulder strength to 3+/5 for increased ability to put items into overhead cabinets. Short Term Goal 3 Progress: Progressing toward goal Short Term Goal 4: Patient will decrease pain to 3/10 in his right shoulder when working. Short Term Goal 4 Progress: Progressing toward goal Short Term Goal 5: Patient will decrease fascial restrictions from moderate to minimal in his right shoulder. Short Term Goal 5 Progress: Progressing toward goal Long Term Goals Long Term Goal 1: Patient will return to prior level of independence with all B/IADLs, work, lesiure activities. Long Term Goal 1 Progress: Progressing toward goal Long Term Goal 2: Patient will increase AROM to WNL for increased independence with reaching overhead and pulling and pushing at work. Long Term Goal 2 Progress: Progressing toward goal Long Term Goal 3: Patient will increase strength in his right shoulder to 5/5 for increased independence pulling and pushing at work. Long Term Goal 3 Progress: Progressing toward goal Long Term Goal 4: Patient will decrease pain to 1/10 in his right shoulder while working. Long Term Goal 4 Progress: Progressing toward goal Long Term Goal 5: Patient will decrease fascial restrictions to trace in his right shoulder. Long Term Goal 5 Progress: Progressing toward goal End of Session Patient Active Problem List  Diagnoses  . HYPERLIPIDEMIA  . CARPAL TUNNEL SYNDROME  . CAD, ARTERY BYPASS GRAFT  . TRANSIENT ISCHEMIC ATTACKS, HX OF  . Disorders of bursae and tendons in shoulder  region, unspecified  . Shoulder pain  . Rotator cuff syndrome  . Rotator cuff tear,  right  . Arthritis, shoulder region  . Biceps tendinopathy  . Rotator cuff tendonitis  . S/P arthroscopy of shoulder  . Shoulder arthritis  . Nontraumatic rupture of tendons of biceps (long head)  . Synovitis of shoulder  . Peripheral vascular disease with claudication  . S/P shoulder surgery   End of Session Activity Tolerance: Patient tolerated treatment well General Behavior During Session: [facility] for tasks performed Cognition: [facility] for tasks performed   Shirlean Mylar, OTR/L  03/03/2011, 11:51 AM

## 2011-03-08 ENCOUNTER — Ambulatory Visit (HOSPITAL_COMMUNITY)
Admission: RE | Admit: 2011-03-08 | Discharge: 2011-03-08 | Disposition: A | Discharge status: Home / Self Care | Payer: BC Managed Care – PPO | Source: Ambulatory Visit | Attending: Family Medicine | Admitting: Family Medicine

## 2011-03-08 NOTE — Progress Notes (Signed)
Occupational Therapy Treatment  Patient Details  Name: Devin [name] MRN: [id number] Date of Birth: 17-Jan-1946  Today's Date: 03/08/2011 Time: 4132-4401 Time Calculation (min): 54 min Visit#: 5  of 16   Re-eval: 03/22/11 Manuel Therapy 0272-5366 21'  (Completed by Sondra Barges, OTR/L) Therapeutic Exercise  [phone]  24' (Completed by Theophilus Bones, COTA/L)  Subjective Symptoms/Limitations Symptoms: S:  It feels better, but it is still sore.  Pain Assessment Currently in Pain?: Yes Pain Score:   2 Pain Location: Shoulder Pain Orientation: Right Pain Type: Acute pain  Exercise/Treatments Supine Protraction: PROM;AAROM;15 reps;AROM;10 reps Horizontal ABduction: PROM;AAROM;15 reps;AROM;10 reps External Rotation: PROM;AAROM;15 reps;AROM;10 reps Internal Rotation: PROM;AAROM;15 reps;AROM;10 reps Flexion: PROM;AAROM;15 reps;AROM;10 reps ABduction: PROM;AAROM;15 reps;AROM;10 reps Seated Elevation: AROM;15 reps Extension: AROM;15 reps Retraction: AROM;15 reps Row: AROM;15 reps Protraction: AROM;10 reps Horizontal ABduction: AROM;10 reps External Rotation: AROM;10 reps Internal Rotation: AROM;10 reps Flexion: AROM;10 reps Abduction: AROM;10 reps Pulleys Flexion: 2 minutes ABduction: 2 minutes Therapy Ball Flexion: 25 reps ABduction: 25 reps ROM / Strengthening / Isometric Strengthening Wall Wash: low x 2 min Flexion: Theraband;3X3" Theraband Level (Flexion): Level 2 (Red) Extension: Theraband;3X3" Theraband Level (Extension): Level 2 (Red) External Rotation: Theraband;3X3" Theraband Level (External Rotation): Level 2 (Red) Internal Rotation: Theraband;3X3" Theraband Level (Internal Rotation): Level 2 (Red) ABduction: Theraband;3X3" Theraband Level (ABduction): Level 2 (Red) ADduction: Theraband;3X3" Theraband Level (ADduction): Level 2 (Red)  Manual Therapy Manual Therapy: Myofascial release Myofascial Release: MFR and manual stretching to right shoulder  region including upper arm, biceps, scapular region, and pectoral region.  [id number] Other Manual Therapy: scar release initiated this date.  Occupational Therapy Assessment and Plan OT Assessment and Plan Clinical Impression Statement: A:  added AROM to supine and seated. Cues to keep shoulder depressed with pullies. Rehab Potential: Excellent OT Plan: P:  increase reps with supine and seated AROM.   Goals Short Term Goals Short Term Goal 1: Patient will be I with HEP. Short Term Goal 2: Patient will increase PROM to [facility] in right shoulder for increased ability to comb his hair. Short Term Goal 3: Patient will increase right shoulder strength to 3+/5 for increased ability to put items into overhead cabinets. Short Term Goal 4: Patient will decrease pain to 3/10 in his right shoulder when working. Short Term Goal 5: Patient will decrease fascial restrictions from moderate to minimal in his right shoulder. Long Term Goals Long Term Goal 1: Patient will return to prior level of independence with all B/IADLs, work, lesiure activities. Long Term Goal 2: Patient will increase AROM to WNL for increased independence with reaching overhead and pulling and pushing at work. Long Term Goal 3: Patient will increase strength in his right shoulder to 5/5 for increased independence pulling and pushing at work. Long Term Goal 4: Patient will decrease pain to 1/10 in his right shoulder while working. Long Term Goal 5: Patient will decrease fascial restrictions to trace in his right shoulder. End of Session Patient Active Problem List  Diagnoses  . HYPERLIPIDEMIA  . CARPAL TUNNEL SYNDROME  . CAD, ARTERY BYPASS GRAFT  . TRANSIENT ISCHEMIC ATTACKS, HX OF  . 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 Shoulder pain  . Rotator cuff syndrome  . Rotator cuff tear, right  . Arthritis, shoulder region  . Biceps tendinopathy  . Rotator cuff tendonitis  . S/P arthroscopy of shoulder  .  Shoulder arthritis  . Nontraumatic rupture of tendons of biceps (long head)  . Synovitis of shoulder  .  Peripheral vascular disease with claudication  . S/P shoulder surgery   End of Session Activity Tolerance: Patient tolerated treatment well   Keaton Stirewalt L. Colm Lyford, COTA/L  03/08/2011, 11:13 AM

## 2011-03-11 ENCOUNTER — Ambulatory Visit (HOSPITAL_COMMUNITY)
Admission: RE | Admit: 2011-03-11 | Discharge: 2011-03-11 | Disposition: A | Discharge status: Home / Self Care | Payer: BC Managed Care – PPO | Source: Ambulatory Visit | Attending: Family Medicine | Admitting: Family Medicine

## 2011-03-11 NOTE — Progress Notes (Signed)
Occupational Therapy Treatment  Patient Details  Name: Devin [name] MRN: [id number] Date of Birth: 12-20-1945  Today's Date: 03/11/2011 Time: 8657-8469 Time Calculation (min): 52 min Visit#: 6  of 16   Re-eval: 03/22/11 Manuel Therapy [phone] 21' Therapeutic Exercise [phone]  30'    Subjective Symptoms/Limitations Symptoms: S:  Sometimes it is about a 1, sometimes none. Pain Assessment Currently in Pain?: Yes Pain Score:   1 Pain Location: Arm Pain Orientation: Right Pain Type: Acute pain  Exercise/Treatments Supine Protraction: PROM;10 reps;AROM;12 reps Horizontal ABduction: PROM;10 reps;AROM;12 reps External Rotation: PROM;10 reps;AROM;12 reps Internal Rotation: PROM;10 reps;AROM;12 reps Flexion: PROM;10 reps;AROM;12 reps ABduction: PROM;10 reps;AROM;12 reps Seated Elevation: AROM;15 reps Extension: AROM;15 reps Retraction: AROM;15 reps Row: AROM;15 reps Protraction: AROM;12 reps Horizontal ABduction: AROM;12 reps External Rotation: AROM;12 reps Internal Rotation: AROM;12 reps Flexion: AROM;12 reps Abduction: AROM;12 reps Flexion: 2 minutes ABduction: 2 minutes Therapy Ball Flexion: 25 reps ABduction: 25 reps ROM / Strengthening / Isometric Strengthening Wall Wash: low x 2 min Flexion: Theraband;3X3" Theraband Level (Flexion): Level 2 (Red) Extension: Theraband;3X3" Theraband Level (Extension): Level 2 (Red) External Rotation: Theraband;3X3" Theraband Level (External Rotation): Level 2 (Red) Internal Rotation: Theraband;3X3" Theraband Level (Internal Rotation): Level 2 (Red) ABduction: Theraband;3X3" Theraband Level (ABduction): Level 2 (Red) ADduction: Theraband;3X3" Theraband Level (ADduction): Level 2 (Red)    Manual Therapy Manual Therapy: Myofascial release Myofascial Release: MFR and manual stretching to right shoulder region including upper arm, biceps, scapular region, and pectoral region.   Occupational Therapy Assessment and  Plan OT Assessment and Plan Clinical Impression Statement: A:  Added ball circles which patient tolerated well. Rehab Potential: Excellent OT Plan: P:  Attempt thumbtacks on wall.   Goals Short Term Goals Short Term Goal 1: Patient will be I with HEP. Short Term Goal 2: Patient will increase PROM to [facility] in right shoulder for increased ability to comb his hair. Short Term Goal 3: Patient will increase right shoulder strength to 3+/5 for increased ability to put items into overhead cabinets. Short Term Goal 4: Patient will decrease pain to 3/10 in his right shoulder when working. Short Term Goal 5: Patient will decrease fascial restrictions from moderate to minimal in his right shoulder. Long Term Goals Long Term Goal 1: Patient will return to prior level of independence with all B/IADLs, work, lesiure activities. Long Term Goal 2: Patient will increase AROM to WNL for increased independence with reaching overhead and pulling and pushing at work. Long Term Goal 3: Patient will increase strength in his right shoulder to 5/5 for increased independence pulling and pushing at work. Long Term Goal 4: Patient will decrease pain to 1/10 in his right shoulder while working. Long Term Goal 5: Patient will decrease fascial restrictions to trace in his right shoulder. End of Session Patient Active Problem List  Diagnoses  . HYPERLIPIDEMIA  . CARPAL TUNNEL SYNDROME  . CAD, ARTERY BYPASS GRAFT  . TRANSIENT ISCHEMIC ATTACKS, HX OF  . 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 Shoulder pain  . Rotator cuff syndrome  . Rotator cuff tear, right  . Arthritis, shoulder region  . Biceps tendinopathy  . Rotator cuff tendonitis  . S/P arthroscopy of shoulder  . Shoulder arthritis  . Nontraumatic rupture of tendons of biceps (long head)  . Synovitis of shoulder  . Peripheral vascular disease with claudication  . S/P shoulder surgery   End of Session Activity Tolerance: Patient  tolerated treatment well General Behavior During Session: [facility] for tasks  performed Cognition: [facility] for tasks performed   Aizza Santiago L. Jami Bogdanski, COTA/L  03/11/2011, 12:09 PM

## 2011-03-14 ENCOUNTER — Ambulatory Visit (HOSPITAL_COMMUNITY)
Admission: RE | Admit: 2011-03-14 | Discharge: 2011-03-14 | Disposition: A | Discharge status: Home / Self Care | Payer: BC Managed Care – PPO | Source: Ambulatory Visit | Attending: Occupational Therapy | Admitting: Occupational Therapy

## 2011-03-14 NOTE — Progress Notes (Signed)
Occupational Therapy Treatment  Patient Details  Name: Devin [name] MRN: [id number] Date of Birth: [birth year]  Today's Date: 03/14/2011 Time: 7829-5621 Time Calculation (min): 52 min Visit#: 7  of 16   Re-eval: 03/22/11 Manuel Therapy  [id number]  69' Therapeutic Exercise [phone]  32'    Subjective Symptoms/Limitations Symptoms: S:  It hurts just a little. Pain Assessment Currently in Pain?: Yes Pain Score:   1 Pain Location: Arm  Exercise/Treatments Supine Protraction: PROM;10 reps;AROM;15 reps Horizontal ABduction: PROM;10 reps;AROM;15 reps External Rotation: PROM;10 reps;AROM;15 reps Internal Rotation: PROM;10 reps;AROM;15 reps Flexion: PROM;10 reps;AROM;15 reps ABduction: PROM;10 reps;AROM;15 reps Seated Elevation: AROM;15 reps Extension: AROM;15 reps Retraction: AROM;15 reps Row: AROM;15 reps Protraction: AROM;15 reps Horizontal ABduction: AROM;15 reps External Rotation: AROM;15 reps Internal Rotation: AROM;15 reps Flexion: AROM;15 reps Abduction: AROM;15 reps Therapy Ball Flexion: 25 reps ABduction: 25 reps ROM / Strengthening / Isometric Strengthening Wall Wash: 3'   Manual Therapy Manual Therapy: Myofascial release Myofascial Release: MFR and manual stretching to right shoulder region including upper arm, biceps, scapular region, and pectoral region.  Occupational Therapy Assessment and Plan OT Assessment and Plan Clinical Impression Statement: A:  See progress note for MD visit tomorrow. Rehab Potential: Excellent OT Plan: P:  Continue with treatment as planned   Goals Short Term Goals Short Term Goal 1: Patient will be I with HEP. Short Term Goal 2: Patient will increase PROM to [facility] in right shoulder for increased ability to comb his hair. Short Term Goal 3: Patient will increase right shoulder strength to 3+/5 for increased ability to put items into overhead cabinets. Short Term Goal 4: Patient will decrease pain to 3/10 in his right  shoulder when working. Short Term Goal 5: Patient will decrease fascial restrictions from moderate to minimal in his right shoulder. Long Term Goals Long Term Goal 1: Patient will return to prior level of independence with all B/IADLs, work, lesiure activities. Long Term Goal 2: Patient will increase AROM to WNL for increased independence with reaching overhead and pulling and pushing at work. Long Term Goal 3: Patient will increase strength in his right shoulder to 5/5 for increased independence pulling and pushing at work. Long Term Goal 4: Patient will decrease pain to 1/10 in his right shoulder while working. Long Term Goal 5: Patient will decrease fascial restrictions to trace in his right shoulder. End of Session Patient Active Problem List  Diagnoses  . HYPERLIPIDEMIA  . CARPAL TUNNEL SYNDROME  . CAD, ARTERY BYPASS GRAFT  . TRANSIENT ISCHEMIC ATTACKS, HX OF  . 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 Shoulder pain  . Rotator cuff syndrome  . Rotator cuff tear, right  . Arthritis, shoulder region  . Biceps tendinopathy  . Rotator cuff tendonitis  . S/P arthroscopy of shoulder  . Shoulder arthritis  . Nontraumatic rupture of tendons of biceps (long head)  . Synovitis of shoulder  . Peripheral vascular disease with claudication  . S/P shoulder surgery   End of Session Activity Tolerance: Patient tolerated treatment well General Behavior During Session: [facility] for tasks performed Cognition: [facility] for tasks performed   Enisa Runyan L. Emogene [place]alla, COTA/L  03/14/2011, 1:05 PM

## 2011-03-15 ENCOUNTER — Ambulatory Visit (INDEPENDENT_AMBULATORY_CARE_PROVIDER_SITE_OTHER): Payer: BC Managed Care – PPO | Admitting: Cardiovascular Disease

## 2011-03-15 ENCOUNTER — Encounter: Payer: Self-pay | Admitting: Cardiovascular Disease

## 2011-03-15 VITALS — BP 148/70 | HR 60 | Resp 18 | Ht 64.0 in | Wt 140.1 lb

## 2011-03-15 DIAGNOSIS — I70219 Atherosclerosis of native arteries of extremities with intermittent claudication, unspecified extremity: Secondary | ICD-10-CM

## 2011-03-15 DIAGNOSIS — I739 Peripheral vascular disease, unspecified: Secondary | ICD-10-CM

## 2011-03-15 MED ORDER — CILOSTAZOL 100 MG PO TABS: 100.0000 mg | ORAL_TABLET | Freq: Two times a day (BID) | ORAL | Status: DC

## 2011-03-15 NOTE — Patient Instructions (Addendum)
Your physician wants you to follow-up in: 1 YEAR. You will receive a reminder letter in the mail two months in advance. If you don't receive a letter, please call our office to schedule the follow-up appointment.  Your physician has requested that you have an ankle brachial index (ABI) in 1 YEAR. During this test an ultrasound and blood pressure cuff are used to evaluate the arteries that supply the arms and legs with blood. Allow thirty minutes for this exam. There are no restrictions or special instructions.  Your physician has recommended you make the following change in your medication: START Pletal 100mg  take one-half tablet by mouth twice a day for 2 weeks and then increase to 100mg  by mouth twice a day

## 2011-03-22 ENCOUNTER — Ambulatory Visit (HOSPITAL_COMMUNITY)
Admission: RE | Admit: 2011-03-22 | Discharge: 2011-03-22 | Disposition: A | Discharge status: Home / Self Care | Payer: BC Managed Care – PPO | Source: Ambulatory Visit | Attending: Family Medicine | Admitting: Family Medicine

## 2011-03-22 NOTE — Progress Notes (Signed)
Occupational Therapy Treatment  Patient Details  Name: Devin [name] MRN: [id number] Date of Birth: 03-Jun-1945  Today's Date: 03/22/2011 Time: 8295-6213 Time Calculation (min): 72 min Visit#: 8  of 16   Re-eval: 04/11/11 Manuel Therapy  [phone] 46' Therapeutic Exercise [phone]  40'    Subjective Symptoms/Limitations Symptoms: S:  I can tell things are better. Pain Assessment Currently in Pain?: Yes Pain Score:   1 Pain Location: Shoulder Pain Orientation: Right Pain Type: Acute pain   Exercise/Treatments Supine Protraction: PROM;10 reps;AROM;15 reps Horizontal ABduction: PROM;10 reps;AROM;15 reps External Rotation: PROM;10 reps;AROM;15 reps Internal Rotation: PROM;10 reps;AROM;15 reps Flexion: PROM;10 reps;AROM;15 reps ABduction: PROM;10 reps;AROM;15 reps Seated Protraction: AROM;15 reps Horizontal ABduction: AROM;15 reps External Rotation: AROM;15 reps Internal Rotation: AROM;15 reps Flexion: AROM;15 reps Abduction: AROM;15 reps Therapy Ball Flexion: 25 reps ABduction: 25 reps Right/Left: 5 reps  Manual Therapy Manual Therapy: Myofascial release Myofascial Release: MFR and manual stretching to right shoulder region including upper arm, biceps, scapular region, and pectoral region.  Occupational Therapy Assessment and Plan OT Assessment and Plan Clinical Impression Statement: A:  Added scapualr tband and UBE. OT Plan: P:  Add 1# to supine and seated ex.   Goals Short Term Goals Short Term Goal 1: Patient will be I with HEP. Short Term Goal 2: Patient will increase PROM to [facility] in right shoulder for increased ability to comb his hair. Short Term Goal 3: Patient will increase right shoulder strength to 3+/5 for increased ability to put items into overhead cabinets. Short Term Goal 4: Patient will decrease pain to 3/10 in his right shoulder when working. Short Term Goal 5: Patient will decrease fascial restrictions from moderate to minimal in his right  shoulder. Long Term Goals Long Term Goal 1: Patient will return to prior level of independence with all B/IADLs, work, lesiure activities. Long Term Goal 2: Patient will increase AROM to WNL for increased independence with reaching overhead and pulling and pushing at work. Long Term Goal 3: Patient will increase strength in his right shoulder to 5/5 for increased independence pulling and pushing at work. Long Term Goal 4: Patient will decrease pain to 1/10 in his right shoulder while working. Long Term Goal 5: Patient will decrease fascial restrictions to trace in his right shoulder. End of Session Patient Active Problem List  Diagnoses  . HYPERLIPIDEMIA  . CARPAL TUNNEL SYNDROME  . CAD, ARTERY BYPASS GRAFT  . TRANSIENT ISCHEMIC ATTACKS, HX OF  . 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 Shoulder pain  . Rotator cuff syndrome  . Rotator cuff tear, right  . Arthritis, shoulder region  . Biceps tendinopathy  . Rotator cuff tendonitis  . S/P arthroscopy of shoulder  . Shoulder arthritis  . Nontraumatic rupture of tendons of biceps (long head)  . Synovitis of shoulder  . Peripheral vascular disease with claudication  . S/P shoulder surgery   End of Session Activity Tolerance: Patient tolerated treatment well General Behavior During Session: [facility] for tasks performed Cognition: [facility] for tasks performed   [place] L. Cayleen Benjamin, COTA/L  03/22/2011, 10:23 AM

## 2011-03-24 ENCOUNTER — Ambulatory Visit (HOSPITAL_COMMUNITY)
Admission: RE | Admit: 2011-03-24 | Discharge: 2011-03-24 | Disposition: A | Discharge status: Home / Self Care | Payer: BC Managed Care – PPO | Source: Ambulatory Visit | Attending: Family Medicine | Admitting: Family Medicine

## 2011-03-24 NOTE — Progress Notes (Signed)
Occupational Therapy Treatment  Patient Details  Name: Devin [name] MRN: [id number] Date of Birth: [birth year]  Today's Date: 03/24/2011 Time: 4098-1191 Time Calculation (min): 55 min Manual Therapy [phone] 17' Therapeutic Exercises [phone] 37' Visit#: 9  of 16   Re-eval: 04/11/11    Subjective Symptoms/Limitations Symptoms: S:  I had a little more pain the last few days. Pain Assessment Currently in Pain?: Yes Pain Score:   2 Pain Location: Shoulder Pain Orientation: Right Pain Type: Acute pain  O:  Exercise/Treatments Supine Protraction: PROM;Strengthening;10 reps Protraction Weight (lbs): 1 pound Horizontal ABduction: PROM;Strengthening;10 reps Horizontal ABduction Weight (lbs): 1 pound External Rotation: PROM;Strengthening;10 reps External Rotation Weight (lbs): 1 pound Internal Rotation: PROM;Strengthening;10 reps Internal Rotation Weight (lbs): 1 pound Flexion: PROM;Strengthening;10 reps Shoulder Flexion Weight (lbs): 1 pound ABduction: PROM;Strengthening;10 reps Shoulder ABduction Weight (lbs): 1 pound Seated Extension: Theraband;10 reps (red) Retraction: Theraband;10 reps (red) Row: Theraband;10 reps (red) Theraband Level (Shoulder Row):  (red x 10) Protraction: Strengthening;10 reps Protraction Weight (lbs): 1 pound Horizontal ABduction: Strengthening;10 reps Horizontal ABduction Weight (lbs): 1 pound External Rotation: Strengthening;10 reps Theraband Level (Shoulder External Rotation):  (red x 10) External Rotation Weight (lbs): 1 pound Internal Rotation: Strengthening;10 reps Theraband Level (Shoulder Internal Rotation):  (theraband x 10 red) Internal Rotation Weight (lbs): 1 pound Flexion: Strengthening;10 reps Flexion Weight (lbs): 1 pound Abduction: Strengthening;10 reps ABduction Weight (lbs): 1 pound Therapy Ball Flexion: 25 reps ABduction: 25 reps Right/Left: 5 reps ROM / Strengthening / Isometric Strengthening UBE (Upper Arm Bike):  3' and 3' 1.0 Wall Wash: 2' with 1 pound Thumb Tacks: 1' Prot/Ret//Elev/Dep: 1'    Manual Therapy Manual Therapy: Myofascial release Myofascial Release: MFR and manual stretching to right shoulder region including upper arm, biceps, scapular region, and pectoral region to decrease pain and stiffness and increase mobility.  [phone] Other Manual Therapy: scar release to scar on anterior shoulder.  Occupational Therapy Assessment and Plan OT Assessment and Plan Clinical Impression Statement: A:  Added scapular tband and UBE, Added 1 pound to wall wash, supine and seated AROM exercises. OT Plan: P:  Increase to 12 reps with strengthening exercises in supine and seated.   Goals Short Term Goals Short Term Goal 1: Patient will be I with HEP. Short Term Goal 1 Progress: Met Short Term Goal 2: Patient will increase PROM to [facility] in right shoulder for increased ability to comb his hair. Short Term Goal 2 Progress: Progressing toward goal Short Term Goal 3: Patient will increase right shoulder strength to 3+/5 for increased ability to put items into overhead cabinets. Short Term Goal 3 Progress: Met Short Term Goal 4: Patient will decrease pain to 3/10 in his right shoulder when working. Short Term Goal 4 Progress: Met Short Term Goal 5: Patient will decrease fascial restrictions from moderate to minimal in his right shoulder. Short Term Goal 5 Progress: Met Long Term Goals Long Term Goal 1: Patient will return to prior level of independence with all B/IADLs, work, lesiure activities. Long Term Goal 1 Progress: Progressing toward goal Long Term Goal 2: Patient will increase AROM to WNL for increased independence with reaching overhead and pulling and pushing at work. Long Term Goal 2 Progress: Progressing toward goal Long Term Goal 3: Patient will increase strength in his right shoulder to 5/5 for increased independence pulling and pushing at work. Long Term Goal 3 Progress: Progressing toward  goal Long Term Goal 4: Patient will decrease pain to 1/10 in his right shoulder while working. Long Term  Goal 4 Progress: Progressing toward goal Long Term Goal 5: Patient will decrease fascial restrictions to trace in his right shoulder. Long Term Goal 5 Progress: Progressing toward goal End of Session Patient Active Problem List  Diagnoses  . HYPERLIPIDEMIA  . CARPAL TUNNEL SYNDROME  . CAD, ARTERY BYPASS GRAFT  . TRANSIENT ISCHEMIC ATTACKS, HX OF  . 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 Shoulder pain  . Rotator cuff syndrome  . Rotator cuff tear, right  . Arthritis, shoulder region  . Biceps tendinopathy  . Rotator cuff tendonitis  . S/P arthroscopy of shoulder  . Shoulder arthritis  . Nontraumatic rupture of tendons of biceps (long head)  . Synovitis of shoulder  . Peripheral vascular disease with claudication  . S/P shoulder surgery   End of Session Activity Tolerance: Patient tolerated treatment well General Behavior During Session: [facility] for tasks performed Cognition: [facility] for tasks performed   Shirlean Mylar, OTR/L  03/24/2011, 9:50 AM

## 2011-03-29 ENCOUNTER — Ambulatory Visit (INDEPENDENT_AMBULATORY_CARE_PROVIDER_SITE_OTHER): Payer: BC Managed Care – PPO | Admitting: Orthopedic Surgery

## 2011-03-29 ENCOUNTER — Encounter: Payer: Self-pay | Admitting: Orthopedic Surgery

## 2011-03-29 ENCOUNTER — Ambulatory Visit (HOSPITAL_COMMUNITY)
Admission: RE | Admit: 2011-03-29 | Discharge: 2011-03-29 | Disposition: A | Discharge status: Home / Self Care | Payer: BC Managed Care – PPO | Source: Ambulatory Visit | Attending: Family Medicine | Admitting: Family Medicine

## 2011-03-29 DIAGNOSIS — M66329 Spontaneous rupture of flexor tendons, unspecified upper arm: Secondary | ICD-10-CM

## 2011-03-29 DIAGNOSIS — M75100 Unspecified rotator cuff tear or rupture of unspecified shoulder, not specified as traumatic: Secondary | ICD-10-CM

## 2011-03-29 DIAGNOSIS — Z9889 Other specified postprocedural states: Secondary | ICD-10-CM

## 2011-03-29 DIAGNOSIS — M65819 Other synovitis and tenosynovitis, unspecified shoulder: Secondary | ICD-10-CM

## 2011-03-29 DIAGNOSIS — M67919 Unspecified disorder of synovium and tendon, unspecified shoulder: Secondary | ICD-10-CM

## 2011-03-29 NOTE — Patient Instructions (Signed)
Continue OT

## 2011-03-29 NOTE — Progress Notes (Signed)
Postoperative visit  Procedure: (SARS, BICEPS TENOTOMY, DEBRIDEMENT LIMITED, OPEN DISTAL CLAVICLE EXCISION, OPEN ROT. CUFF REPAIR )  Date of procedure 01/07/2011  Diagnosis SYNOVITIS, BICEPS DEGEN TEAR, AC JOINT OA, PARTIAL ROTATOR CUFF TEAR  Operative findings Synovitis glenohumeral joint, degenerative tear biceps tendon, a.c. Joint arthrosis, partial tear rotator cuff.  Progressing well in therapy. His therapy notes reviewed patient's passive range of motion is 130  Progress as tolerated in therapy protocol

## 2011-03-29 NOTE — Progress Notes (Signed)
Occupational Therapy Treatment  Patient Details  Name: Devin [name] MRN: [id number] Date of Birth: 21-Jun-1945  Today's Date: 03/29/2011 Time: 0802-0858 Time Calculation (min): 56 min Manual Therapy [phone] 23' Therapeutic Exercise [phone] 34' Visit#: 10  of 16   Re-eval: 04/11/11    Subjective S:  I go to the MD today at 1130. Pain Assessment Currently in Pain?: Yes Pain Score:   3 Pain Location: Shoulder Pain Orientation: Right Pain Type: Acute pain  O:  Exercise/Treatments Supine Protraction: PROM;Strengthening;12 reps Protraction Weight (lbs): 1 pound Horizontal ABduction: PROM;Strengthening;12 reps Horizontal ABduction Weight (lbs): 1 pound External Rotation: PROM;Strengthening;12 reps External Rotation Weight (lbs): 1 pound Internal Rotation: PROM;Strengthening;12 reps Internal Rotation Weight (lbs): 1 pound Flexion: PROM;Strengthening;12 reps Shoulder Flexion Weight (lbs): 1 pound ABduction: PROM;Strengthening;12 reps Shoulder ABduction Weight (lbs): 1 pound Seated Extension: Theraband;15 reps (red) Retraction: Theraband;15 reps (red) Row: Theraband;15 reps (red) Protraction: Strengthening;15 reps Protraction Weight (lbs): 1 pound Horizontal ABduction: Strengthening;15 reps Horizontal ABduction Weight (lbs): 1 pound External Rotation: Strengthening;Theraband;15 reps (red) External Rotation Weight (lbs): 1 pound Internal Rotation: Strengthening;Theraband;15 reps (red) Internal Rotation Weight (lbs): 1 pound Flexion: Strengthening;15 reps Flexion Weight (lbs): 1 pound Abduction: Strengthening;15 reps ABduction Weight (lbs): 1 pound Therapy Ball Flexion: 25 reps ABduction: 25 reps Right/Left: 5 reps ROM / Strengthening / Isometric Strengthening UBE (Upper Arm Bike): 3' and 3' 1.5 Wall Wash: 3' with 1# Thumb Tacks: 1' Prot/Ret//Elev/Dep: 1'   Manual Therapy Manual Therapy: Myofascial release Myofascial Release: MFR and manual stretching to right  shoulder region including upper arm, biceps, scapular region, and pectoral region to decrease pain and stffiness and increase mobility.  [phone]  Occupational Therapy Assessment and Plan OT Assessment and Plan Clinical Impression Statement: A:  Increased to 15 reps with strengthening and increased to 3 min with wall wash. OT Plan: P:  Attempt 2 pounds in supine strengthening.   Goals Short Term Goals Short Term Goal 1: Patient will be I with HEP. Short Term Goal 1 Progress: Met Short Term Goal 2: Patient will increase PROM to [facility] in right shoulder for increased ability to comb his hair. Short Term Goal 2 Progress: Progressing toward goal Short Term Goal 3: Patient will increase right shoulder strength to 3+/5 for increased ability to put items into overhead cabinets. Short Term Goal 3 Progress: Met Short Term Goal 4: Patient will decrease pain to 3/10 in his right shoulder when working. Short Term Goal 4 Progress: Met Short Term Goal 5: Patient will decrease fascial restrictions from moderate to minimal in his right shoulder. Short Term Goal 5 Progress: Met Long Term Goals Long Term Goal 1: Patient will return to prior level of independence with all B/IADLs, work, lesiure activities. Long Term Goal 1 Progress: Progressing toward goal Long Term Goal 2: Patient will increase AROM to WNL for increased independence with reaching overhead and pulling and pushing at work. Long Term Goal 2 Progress: Progressing toward goal Long Term Goal 3: Patient will increase strength in his right shoulder to 5/5 for increased independence pulling and pushing at work. Long Term Goal 3 Progress: Progressing toward goal Long Term Goal 4: Patient will decrease pain to 1/10 in his right shoulder while working. Long Term Goal 4 Progress: Progressing toward goal Long Term Goal 5: Patient will decrease fascial restrictions to trace in his right shoulder. Long Term Goal 5 Progress: Progressing toward goal End of  Session Patient Active Problem List  Diagnoses  . HYPERLIPIDEMIA  . CARPAL TUNNEL SYNDROME  .  CAD, ARTERY BYPASS GRAFT  . TRANSIENT ISCHEMIC ATTACKS, HX OF  . 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 Shoulder pain  . Rotator cuff syndrome  . Rotator cuff tear, right  . Arthritis, shoulder region  . Biceps tendinopathy  . Rotator cuff tendonitis  . S/P arthroscopy of shoulder  . Shoulder arthritis  . Nontraumatic rupture of tendons of biceps (long head)  . Synovitis of shoulder  . Peripheral vascular disease with claudication  . S/P shoulder surgery   End of Session Activity Tolerance: Patient tolerated treatment well General Behavior During Session: [facility] for tasks performed Cognition: [facility] for tasks performed   Shirlean Mylar, OTR/L  03/29/2011, 8:56 AM

## 2011-03-30 NOTE — Progress Notes (Signed)
HPI:  Mr. Devin [name] is a 65 year old gentleman presenting for initial evaluation of lower extremity PAD. The patient has a long history of bilateral calf claudication symptoms. He describes a tightness in both calves with walking. He feels that both legs are affected equally. He is able to walk slowly for over 1 mile but if he walks at a normal pace he has short distance limitation.  He underwent lower extremity duplex scanning which demonstrated severe mid left superficial femoral artery stenosis. He had mild disease on the right. His ankle-brachial indices were 92% on the right and 76% on the left.  The patient denies rest pain or ischemic ulceration. He denies progression of his calf pain with exertion.  Outpatient Encounter Prescriptions as of 03/15/2011  Medication Sig Dispense Refill  . aspirin (ASPIRIN LOW DOSE) 81 MG EC tablet Take 81 mg by mouth daily.        . clopidogrel (PLAVIX) 75 MG tablet Take 75 mg by mouth daily.       . dorzolamide-timolol (COSOPT) 22.3-6.8 MG/ML ophthalmic solution 1 drop as directed.        Marland Kitchen HYDROcodone-acetaminophen (NORCO) 7.5-325 MG per tablet Take 1 tablet by mouth every 6 (six) hours as needed.  60 tablet  2  . latanoprost (XALATAN) 0.005 % ophthalmic solution Place 1 drop into both eyes daily.       . nitroGLYCERIN (NITROSTAT) 0.4 MG SL tablet Place 1 tablet (0.4 mg total) under the tongue every 5 (five) minutes as needed for chest pain.  25 tablet  8  . omeprazole (PRILOSEC OTC) 20 MG tablet Take 20 mg by mouth daily.        Marland Kitchen PROAIR HFA 108 (90 BASE) MCG/ACT inhaler Inhale 1 puff into the lungs every 4 (four) hours as needed. Shortness of Breath      . simvastatin (ZOCOR) 40 MG tablet Take 40 mg by mouth at bedtime.        Marland Kitchen DISCONTD: HYDROcodone-acetaminophen (NORCO) 7.5-325 MG per tablet Take 1 tablet by mouth every 4 (four) hours as needed.  60 tablet  2  . cilostazol (PLETAL) 100 MG tablet Take 1 tablet (100 mg total) by mouth 2 (two) times daily.   60 tablet  11  . DISCONTD: methocarbamol (ROBAXIN) 500 MG tablet as needed.        Aspirin  Past Medical History  Diagnosis Date  . Coronary atherosclerosis of artery bypass graft   . Personal history of unspecified circulatory disease   . 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 Chest pain, unspecified   . Other and unspecified hyperlipidemia   . TIA (transient ischemic attack)   . Carpal tunnel syndrome   . Asthma   . Shortness of breath   . Chronic kidney disease     hx of kidney stones  . Arthritis   . HOH (hard of hearing)     Past Surgical History  Procedure Date  . Neck surgery   . Carpal tunnel release     blateral carpal tunnel  . Coronary artery bypass graft     x5 12 yrs ago  . Back surgery     cervical disc    History   Social History  . Marital Status: Married    Spouse Name: N/A    Number of Children: N/A  . Years of Education: GED   Occupational History  . fulltime     Social History Main Topics  .  Smoking status: Current Everyday Smoker -- 1.0 packs/day for 50 years    Types: Cigars, Cigarettes  . Smokeless tobacco: Not on file  . Alcohol Use: No  . Drug Use: No  . Sexually Active: Yes   Other Topics Concern  . Not on file   Social History Narrative  . No narrative on file    Family History  Problem Relation Age of Onset  . Heart disease    . Arthritis    . Asthma    . Coronary artery disease    . Anesthesia problems Neg Hx   . Hypotension Neg Hx   . Malignant hyperthermia Neg Hx   . Pseudochol deficiency Neg Hx     ROS: General: no fevers/chills/night sweats Eyes: no blurry vision, diplopia, or amaurosis ENT: no sore throat or hearing loss Resp: no cough, wheezing, or hemoptysis CV: no edema or palpitations GI: no abdominal pain, nausea, vomiting, diarrhea, or constipation GU: no dysuria, frequency, or hematuria Skin: no rash Neuro: no headache, numbness, tingling, or weakness  of extremities Musculoskeletal: no joint pain or swelling Heme: no bleeding, DVT, or easy bruising Endo: no polydipsia or polyuria  BP 148/70  Pulse 60  Resp 18  Ht 5\' 4"  (1.626 m)  Wt 140 lb 1.9 oz (63.558 kg)  BMI 24.05 kg/m2  PHYSICAL EXAM: Pt is alert and oriented, WD, WN, in no distress. HEENT: normal Neck: JVP normal. Carotid upstrokes normal with a right carotid bruit. No thyromegaly. Lungs: equal expansion, clear bilaterally CV: Apex is discrete and nondisplaced, RRR without murmur or gallop Abd: soft, NT, +BS, no bruit, no hepatosplenomegaly Back: no CVA tenderness Ext: no C/C/E        Femoral pulses 2+= without bruits        PT pulse 2+ on the right and trace on the left. DP pulses 1+ and equal bilaterally Skin: warm and dry without rash Neuro: CNII-XII intact             Strength intact = bilaterally  ASSESSMENT AND PLAN:

## 2011-03-30 NOTE — Assessment & Plan Note (Signed)
With stable claudication and an absence of critical limb ischemia recommend a trial of medical therapy and a walking program. The patient will be started on Pletal 50 mg twice daily for 2 weeks then increase to 100 mg twice daily. Potential side effects were discussed. A walking program was discussed in detail. The importance of tobacco cessation was reviewed as it pertains to vascular outcomes and risk for further cardiac problems. I would like to see the patient back with followup ABIs and duplex scanning in one year.  He will call if symptoms worsen.

## 2011-03-31 ENCOUNTER — Encounter: Payer: Self-pay | Admitting: Orthopedic Surgery

## 2011-03-31 ENCOUNTER — Ambulatory Visit (HOSPITAL_COMMUNITY)
Admission: RE | Admit: 2011-03-31 | Discharge: 2011-03-31 | Disposition: A | Discharge status: Home / Self Care | Payer: BC Managed Care – PPO | Source: Ambulatory Visit | Attending: Orthopedic Surgery | Admitting: Orthopedic Surgery

## 2011-03-31 DIAGNOSIS — M6281 Muscle weakness (generalized): Secondary | ICD-10-CM | POA: Insufficient documentation

## 2011-03-31 DIAGNOSIS — M25619 Stiffness of unspecified shoulder, not elsewhere classified: Secondary | ICD-10-CM | POA: Insufficient documentation

## 2011-03-31 DIAGNOSIS — M25519 Pain in unspecified shoulder: Secondary | ICD-10-CM | POA: Insufficient documentation

## 2011-03-31 DIAGNOSIS — IMO0001 Reserved for inherently not codable concepts without codable children: Secondary | ICD-10-CM | POA: Insufficient documentation

## 2011-03-31 NOTE — Progress Notes (Signed)
Occupational Therapy Treatment  Patient Details  Name: Devin [name] MRN: [id number] Date of Birth: [birth year]  Today's Date: 03/31/2011 Time: 4098-1191 Time Calculation (min): 70 min Manual Therapy [phone] 18' Therapeutic Exercises 825-914 27' Visit#: 11  of 16   Re-eval: 04/11/11    Subjective Symptoms/Limitations Symptoms: S:  The MD was pleased with my progress. Pain Assessment Currently in Pain?: Yes Pain Score:   1 Pain Location: Shoulder Pain Orientation: Right Pain Type: Acute pain   Exercise/Treatments Supine Protraction: PROM;Strengthening;10 reps Protraction Weight (lbs): 2 Horizontal ABduction: PROM;Strengthening Horizontal ABduction Weight (lbs): 2 External Rotation: PROM;Strengthening External Rotation Weight (lbs): 2 Internal Rotation: PROM;Strengthening Internal Rotation Weight (lbs): 2 Flexion: PROM;Strengthening Shoulder Flexion Weight (lbs): 2 ABduction: PROM;Strengthening;10 reps Shoulder ABduction Weight (lbs): 2 Seated Protraction: Strengthening;15 reps Protraction Weight (lbs): 2 Horizontal ABduction: Strengthening;15 reps Horizontal ABduction Weight (lbs): 2 External Rotation: Strengthening;Theraband;15 reps (green) External Rotation Weight (lbs): 2 Internal Rotation: Theraband;15 reps;AROM;10 reps (green) Internal Rotation Weight (lbs): 2 Flexion: Strengthening;10 reps Flexion Weight (lbs): 2 Abduction: Strengthening;10 reps ABduction Weight (lbs): 2 Therapy Ball Flexion: 25 reps ABduction: 25 reps Right/Left: 5 reps ROM / Strengthening / Isometric Strengthening UBE (Upper Arm Bike): 3' and 3' 2.0 Cybex Press: 1 plate;10 reps Cybex Row: 1 plate;10 reps Wall Wash: 4' with 1# Thumb Tacks: 1' "W" Arms: 10 with 1 pound X to V Arms: 10 with 1 pound Prot/Ret//Elev/Dep: 1'   Manual Therapy Manual Therapy: Myofascial release Myofascial Release: MFR and manual stretching to right shoulder region including upper arm, biceps,  scapular region, and pectoral region to decrease pain and stiffness and increase mobility.  [phone]  Occupational Therapy Assessment and Plan OT Assessment and Plan Clinical Impression Statement: A:  Increased to 2# with supine and seated strengthening. OT Plan: P:  Increase to 2# with wall wash.   Goals Short Term Goals Short Term Goal 1: Patient will be I with HEP. Short Term Goal 2: Patient will increase PROM to [facility] in right shoulder for increased ability to comb his hair. Short Term Goal 3: Patient will increase right shoulder strength to 3+/5 for increased ability to put items into overhead cabinets. Short Term Goal 4: Patient will decrease pain to 3/10 in his right shoulder when working. Short Term Goal 5: Patient will decrease fascial restrictions from moderate to minimal in his right shoulder. Long Term Goals Long Term Goal 1: Patient will return to prior level of independence with all B/IADLs, work, lesiure activities. Long Term Goal 2: Patient will increase AROM to WNL for increased independence with reaching overhead and pulling and pushing at work. Long Term Goal 3: Patient will increase strength in his right shoulder to 5/5 for increased independence pulling and pushing at work. Long Term Goal 4: Patient will decrease pain to 1/10 in his right shoulder while working. Long Term Goal 5: Patient will decrease fascial restrictions to trace in his right shoulder. End of Session Patient Active Problem List  Diagnoses  . HYPERLIPIDEMIA  . CARPAL TUNNEL SYNDROME  . CAD, ARTERY BYPASS GRAFT  . TRANSIENT ISCHEMIC ATTACKS, HX OF  . 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 Shoulder pain  . Rotator cuff syndrome  . Rotator cuff tear, right  . Arthritis, shoulder region  . Biceps tendinopathy  . Rotator cuff tendonitis  . S/P arthroscopy of shoulder  . Shoulder arthritis  . Nontraumatic rupture of tendons of biceps (long head)  . Synovitis of shoulder  .  Peripheral vascular disease  with claudication  . S/P shoulder surgery   End of Session Activity Tolerance: Patient tolerated treatment well General Behavior During Session: [facility] for tasks performed Cognition: [facility] for tasks performed   Shirlean Mylar, OTR/L  03/31/2011, 9:24 AM

## 2011-04-06 ENCOUNTER — Ambulatory Visit (HOSPITAL_COMMUNITY)
Admission: RE | Admit: 2011-04-06 | Discharge: 2011-04-06 | Disposition: A | Discharge status: Home / Self Care | Payer: BC Managed Care – PPO | Source: Ambulatory Visit | Attending: Family Medicine | Admitting: Family Medicine

## 2011-04-06 NOTE — Progress Notes (Signed)
Occupational Therapy Treatment  Patient Details  Name: Devin [name] MRN: [id number] Date of Birth: [birth year]  Today's Date: 04/06/2011 Time: 1021-1129 Time Calculation (min): 68 min Visit#: 12  of 16   Re-eval: 04/11/11 [facility]  4098-1191 47' Therapeutic Exercise  1050-1129 39'    Subjective Symptoms/Limitations Symptoms: S:  Sometimes it feels like it wants to catch and bone rubbing on bone. Pain Assessment Currently in Pain?: Yes Pain Score:   1 Pain Location: Shoulder Pain Orientation: Right Pain Type: Acute pain   Exercise/Treatments Supine Protraction: PROM;Strengthening;12 reps Protraction Weight (lbs): 2 Horizontal ABduction: PROM;Strengthening;12 reps Horizontal ABduction Weight (lbs): 2 External Rotation: PROM;Strengthening;12 reps External Rotation Weight (lbs): 2 Internal Rotation: PROM;Strengthening;12 reps Internal Rotation Weight (lbs): 2 Flexion: PROM;Strengthening;12 reps Shoulder Flexion Weight (lbs): 2 ABduction: PROM;Strengthening;12 reps Shoulder ABduction Weight (lbs): 2 Seated Extension: Theraband;15 reps Theraband Level (Shoulder Extension): Level 3 (Green) Retraction: Theraband;15 reps Theraband Level (Shoulder Retraction): Level 3 (Green) Row: Theraband;15 reps Theraband Level (Shoulder Row): Level 3 (Green) Protraction: Strengthening;15 reps Protraction Weight (lbs): 2 Horizontal ABduction: Strengthening;15 reps Horizontal ABduction Weight (lbs): 2 External Rotation: Strengthening;Theraband;15 reps Theraband Level (Shoulder External Rotation): Level 3 (Green) External Rotation Weight (lbs): 2 Internal Rotation: Theraband;15 reps;AROM;10 reps Theraband Level (Shoulder Internal Rotation): Level 3 (Green) Internal Rotation Weight (lbs): 2 Flexion: Strengthening;10 reps Flexion Weight (lbs): 2 Abduction: Strengthening;10 reps ABduction Weight (lbs): 2 Therapy Ball Flexion: 25 reps ABduction: 25 reps Right/Left: 5  reps ROM / Strengthening / Isometric Strengthening UBE (Upper Arm Bike): 3' and 3' 2.5 Cybex Press: 1 plate;15 reps Cybex Row: 1 plate;15 reps Wall Wash: 4' with 1# Thumb Tacks: 1' "W" Arms: 10 with 1 pound X to V Arms: 10 with 1 pound Prot/Ret//Elev/Dep: 1'   Manual Therapy Manual Therapy: Myofascial release Myofascial Release: MFR and manual stretching to right shoulder region including upper arm, biceps, scapular region, and pectoral region. Begin scar release next visit   Occupational Therapy Assessment and Plan OT Assessment and Plan Clinical Impression Statement: A:  Increased UBE resistance OT Plan: P:  Increase to 2# for 2' with wall wash.   Goals Short Term Goals Short Term Goal 1: Patient will be I with HEP. Short Term Goal 2: Patient will increase PROM to [facility] in right shoulder for increased ability to comb his hair. Short Term Goal 3: Patient will increase right shoulder strength to 3+/5 for increased ability to put items into overhead cabinets. Short Term Goal 4: Patient will decrease pain to 3/10 in his right shoulder when working. Short Term Goal 5: Patient will decrease fascial restrictions from moderate to minimal in his right shoulder. Long Term Goals Long Term Goal 1: Patient will return to prior level of independence with all B/IADLs, work, lesiure activities. Long Term Goal 2: Patient will increase AROM to WNL for increased independence with reaching overhead and pulling and pushing at work. Long Term Goal 3: Patient will increase strength in his right shoulder to 5/5 for increased independence pulling and pushing at work. Long Term Goal 4: Patient will decrease pain to 1/10 in his right shoulder while working. Long Term Goal 5: Patient will decrease fascial restrictions to trace in his right shoulder. End of Session Patient Active Problem List  Diagnoses  . HYPERLIPIDEMIA  . CARPAL TUNNEL SYNDROME  . CAD, ARTERY BYPASS GRAFT  . TRANSIENT ISCHEMIC ATTACKS,  HX OF  . 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 Shoulder pain  . Rotator cuff syndrome  .  Rotator cuff tear, right  . Arthritis, shoulder region  . Biceps tendinopathy  . Rotator cuff tendonitis  . S/P arthroscopy of shoulder  . Shoulder arthritis  . Nontraumatic rupture of tendons of biceps (long head)  . Synovitis of shoulder  . Peripheral vascular disease with claudication  . S/P shoulder surgery   End of Session Activity Tolerance: Patient tolerated treatment well General Behavior During Session: [facility] for tasks performed Cognition: [facility] for tasks performed   Lurine Imel L. Morningstar Toft, COTA/L  04/06/2011, 11:38 AM

## 2011-04-07 ENCOUNTER — Ambulatory Visit (HOSPITAL_COMMUNITY)
Admission: RE | Admit: 2011-04-07 | Discharge: 2011-04-07 | Disposition: A | Discharge status: Home / Self Care | Payer: BC Managed Care – PPO | Source: Ambulatory Visit | Attending: Family Medicine | Admitting: Family Medicine

## 2011-04-07 NOTE — Progress Notes (Signed)
Occupational Therapy Treatment  Patient Details  Name: Devin [name] MRN: [id number] Date of Birth: [birth year]  Today's Date: 04/07/2011 Time: 1478-2956 Time Calculation (min): 57 min Manual therapy 213-086 19' Therapeutic Exercises 825-902 37' Visit#: 13  of 16   Re-eval: 04/11/11    Subjective Symptoms/Limitations Symptoms: S:  I can reach into the top shelf of the kelvinator now with my right arm. Pain Assessment Currently in Pain?: Yes Pain Score:   1 Pain Orientation: Right Pain Type: Acute pain  O:  Exercise/Treatments Supine Protraction: PROM;10 reps;Strengthening;15 reps Protraction Weight (lbs): 2 Horizontal ABduction: PROM;10 reps;Strengthening;15 reps Horizontal ABduction Weight (lbs): 2 External Rotation: PROM;10 reps;Strengthening;15 reps External Rotation Weight (lbs): 2 Internal Rotation: PROM;10 reps;Strengthening;15 reps Internal Rotation Weight (lbs): 2 Flexion: PROM;10 reps;Strengthening;15 reps Shoulder Flexion Weight (lbs): 2 ABduction: PROM;10 reps;Strengthening;15 reps Shoulder ABduction Weight (lbs): 2 Seated Extension: Theraband;15 reps Theraband Level (Shoulder Extension):  (blue) Retraction: Theraband;15 reps (blue) Row: Theraband;15 reps (blue) Protraction: Strengthening;15 reps Protraction Weight (lbs): 2 Horizontal ABduction: Strengthening;15 reps Horizontal ABduction Weight (lbs): 2 External Rotation: Strengthening;Theraband;15 reps (blue) External Rotation Weight (lbs): 2 Internal Rotation: Strengthening;Theraband;15 reps (blue) Internal Rotation Weight (lbs): 2 Flexion: Strengthening;15 reps Flexion Weight (lbs): 2 Abduction: Strengthening;15 reps ABduction Weight (lbs): 2 Therapy Ball Flexion:  (dc) ABduction:  (dc) Right/Left:  (dc) ROM / Strengthening / Isometric Strengthening UBE (Upper Arm Bike): 3' and 3' 2.5 Cybex Press: 1.5 plate;15 reps Cybex Row: 1.5 plate;15 reps Wall Wash: 2' wtih 2# Thumb Tacks: 1' "W"  Arms: 15 with 2 pounds X to V Arms: 15 with 2 pounds Prot/Ret//Elev/Dep: 1'   Manual Therapy Manual Therapy: Myofascial release Myofascial Release: MFR and manual stretching to right shoulder region including upper arm, biceps region, scapular region, and pectoral region. [phone]  Occupational Therapy Assessment and Plan OT Assessment and Plan Clinical Impression Statement: A:  Increased to blue tband and 2 pounds with wall wash. OT Plan: P:   REASSESS, increase to 3# in supine strengthening and increase to 3' with wall wash.     Goals Short Term Goals Short Term Goal 1: Patient will be I with HEP. Short Term Goal 2: Patient will increase PROM to [facility] in right shoulder for increased ability to comb his hair. Short Term Goal 3: Patient will increase right shoulder strength to 3+/5 for increased ability to put items into overhead cabinets. Short Term Goal 4: Patient will decrease pain to 3/10 in his right shoulder when working. Short Term Goal 5: Patient will decrease fascial restrictions from moderate to minimal in his right shoulder. Long Term Goals Long Term Goal 1: Patient will return to prior level of independence with all B/IADLs, work, lesiure activities. Long Term Goal 2: Patient will increase AROM to WNL for increased independence with reaching overhead and pulling and pushing at work. Long Term Goal 3: Patient will increase strength in his right shoulder to 5/5 for increased independence pulling and pushing at work. Long Term Goal 4: Patient will decrease pain to 1/10 in his right shoulder while working. Long Term Goal 5: Patient will decrease fascial restrictions to trace in his right shoulder. End of Session Patient Active Problem List  Diagnoses  . HYPERLIPIDEMIA  . CARPAL TUNNEL SYNDROME  . CAD, ARTERY BYPASS GRAFT  . TRANSIENT ISCHEMIC ATTACKS, HX OF  . 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 Shoulder pain  . Rotator cuff syndrome  . Rotator  cuff tear, right  . Arthritis, shoulder region  .  Biceps tendinopathy  . Rotator cuff tendonitis  . S/P arthroscopy of shoulder  . Shoulder arthritis  . Nontraumatic rupture of tendons of biceps (long head)  . Synovitis of shoulder  . Peripheral vascular disease with claudication  . S/P shoulder surgery   End of Session Activity Tolerance: Patient tolerated treatment well General Behavior During Session: [facility] for tasks performed Cognition: [facility] for tasks performed   Shirlean Mylar, OTR/L  04/07/2011, 9:17 AM

## 2011-04-12 ENCOUNTER — Ambulatory Visit (HOSPITAL_COMMUNITY)
Admission: RE | Admit: 2011-04-12 | Discharge: 2011-04-12 | Disposition: A | Discharge status: Home / Self Care | Payer: BC Managed Care – PPO | Source: Ambulatory Visit | Attending: Family Medicine | Admitting: Family Medicine

## 2011-04-12 NOTE — Progress Notes (Signed)
Occupational Therapy Treatment  Patient Details  Name: Devin [name] MRN: [id number] Date of Birth: 1945/07/09  Today's Date: 04/12/2011 Time: 4098-1191 Time Calculation (min): 64 min Visit#: 14  of 24   Re-eval: 05/10/11 [facility]  [phone]  62' Therapeutic Exercise  915-925  10'         [phone]  27' Reassess [phone]  5'    Subjective Symptoms/Limitations Symptoms: S:  It was bothering me some last night, I can't lie on that side anymore. Pain Assessment Currently in Pain?: Yes Pain Score:   1 Pain Location: Shoulder Pain Orientation: Right Pain Type: Acute pain   Exercise/Treatments Supine Protraction: PROM;Strengthening;10 reps Protraction Weight (lbs): 3# Horizontal ABduction: PROM;Strengthening;10 reps Horizontal ABduction Weight (lbs): 3# External Rotation: PROM;Strengthening;10 reps External Rotation Weight (lbs): 3# Internal Rotation: PROM;Strengthening;10 reps Internal Rotation Weight (lbs): 3# Flexion: PROM;Strengthening;10 reps Shoulder Flexion Weight (lbs): 3# ABduction: PROM;Strengthening;10 reps Shoulder ABduction Weight (lbs): 3# Seated Extension: Theraband;15 reps Theraband Level (Shoulder Extension): Level 4 (Blue) Retraction: Theraband;15 reps Theraband Level (Shoulder Retraction): Level 4 (Blue) Row: Theraband;15 reps Theraband Level (Shoulder Row): Level 4 (Blue) Protraction: Strengthening;10 reps Protraction Weight (lbs): 3# Horizontal ABduction: Strengthening;10 reps Horizontal ABduction Weight (lbs): 3# External Rotation: Strengthening;10 reps Theraband Level (Shoulder External Rotation): Level 4 (Blue) External Rotation Weight (lbs): 3# Internal Rotation: Strengthening;10 reps;Theraband;15 reps Theraband Level (Shoulder Internal Rotation): Level 4 (Blue) Internal Rotation Weight (lbs): 3# Flexion: Strengthening;10 reps Flexion Weight (lbs): 3# Abduction: Strengthening;10 reps ABduction Weight (lbs): 3#    ROM /  Strengthening / Isometric Strengthening UBE (Upper Arm Bike): 3' and 3' 3.0 Cybex Press: 2 plate;10 reps Cybex Row: 2 plate;10 reps Wall Wash: 3' with 2# Thumb Tacks: 1' "W" Arms: 15 with 2 pounds X to V Arms: 15 with 2 pounds Prot/Ret//Elev/Dep: 1'   Manual Therapy Manual Therapy: Myofascial release Myofascial Release: MFR and manual stretching to right shoulder region including upper arm, biceps, scapular region, and pectoral region  Occupational Therapy Assessment and Plan OT Assessment and Plan Clinical Impression Statement: A:  See progress note.  Added blue tband to hep Rehab Potential: Excellent OT Plan: P:  Continue 2x a week for 4 weeks.   Goals Short Term Goals Short Term Goal 1: Patient will be I with HEP. Short Term Goal 1 Progress: Met Short Term Goal 2: Patient will increase PROM to [facility] in right shoulder for increased ability to comb his hair. Short Term Goal 2 Progress: Met Short Term Goal 3: Patient will increase right shoulder strength to 3+/5 for increased ability to put items into overhead cabinets. Short Term Goal 3 Progress: Met Short Term Goal 4: Patient will decrease pain to 3/10 in his right shoulder when working. Short Term Goal 4 Progress: Met Short Term Goal 5: Patient will decrease fascial restrictions from moderate to minimal in his right shoulder. Short Term Goal 5 Progress: Met Long Term Goals Long Term Goal 1: Patient will return to prior level of independence with all B/IADLs, work, lesiure activities. Long Term Goal 1 Progress: Progressing toward goal Long Term Goal 2: Patient will increase AROM to WNL for increased independence with reaching overhead and pulling and pushing at work. Long Term Goal 2 Progress: Progressing toward goal Long Term Goal 3: Patient will increase strength in his right shoulder to 5/5 for increased independence pulling and pushing at work. Long Term Goal 3 Progress: Progressing toward goal Long Term Goal 4: Patient  will decrease pain to 1/10 in his right shoulder while working.  Long Term Goal 4 Progress: Progressing toward goal Long Term Goal 5: Patient will decrease fascial restrictions to trace in his right shoulder. Long Term Goal 5 Progress: Progressing toward goal End of Session Patient Active Problem List  Diagnoses  . HYPERLIPIDEMIA  . CARPAL TUNNEL SYNDROME  . CAD, ARTERY BYPASS GRAFT  . TRANSIENT ISCHEMIC ATTACKS, HX OF  . 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 Shoulder pain  . Rotator cuff syndrome  . Rotator cuff tear, right  . Arthritis, shoulder region  . Biceps tendinopathy  . Rotator cuff tendonitis  . S/P arthroscopy of shoulder  . Shoulder arthritis  . Nontraumatic rupture of tendons of biceps (long head)  . Synovitis of shoulder  . Peripheral vascular disease with claudication  . S/P shoulder surgery   End of Session Activity Tolerance: Patient tolerated treatment well General Behavior During Session: [facility] for tasks performed Cognition: [facility] for tasks performed   Markees Carns L. Jadasia Haws, COTA/L  04/12/2011, 10:35 AM

## 2011-04-14 ENCOUNTER — Ambulatory Visit (HOSPITAL_COMMUNITY)
Admission: RE | Admit: 2011-04-14 | Discharge: 2011-04-14 | Disposition: A | Discharge status: Home / Self Care | Payer: BC Managed Care – PPO | Source: Ambulatory Visit | Attending: Family Medicine | Admitting: Family Medicine

## 2011-04-14 NOTE — Progress Notes (Signed)
Occupational Therapy Treatment  Patient Details  Name: Devin [name] MRN: [id number] Date of Birth: July 11, 1945  Today's Date: 04/14/2011 Time: 7829-5621 Time Calculation (min): 49 min Manual Therapy [phone] 21' Therapeutic Exercises [phone] 42' Visit#: 15  of 24   Re-eval: 05/10/11    Subjective Symptoms/Limitations Symptoms: S:  I have been doing the theraband exercises. Pain Assessment Currently in Pain?: Yes Pain Score:   1 Pain Location: Shoulder Pain Orientation: Right Pain Type: Acute pain  Exercise/Treatments Supine Protraction: PROM;10 reps;Strengthening;12 reps Protraction Weight (lbs): 3# Horizontal ABduction: PROM;10 reps;Strengthening;12 reps Horizontal ABduction Weight (lbs): 3# External Rotation: PROM;10 reps;Strengthening;12 reps External Rotation Weight (lbs): 3# Internal Rotation: PROM;10 reps;Strengthening;12 reps Internal Rotation Weight (lbs): 3# Flexion: PROM;10 reps;Strengthening;12 reps Shoulder Flexion Weight (lbs): 3# ABduction: PROM;10 reps;Strengthening;12 reps Shoulder ABduction Weight (lbs): 3# Seated Protraction: Strengthening;12 reps Protraction Weight (lbs): 3# Horizontal ABduction: Strengthening;12 reps Horizontal ABduction Weight (lbs): 3# External Rotation: Strengthening;12 reps External Rotation Weight (lbs): 3# Internal Rotation: Strengthening;12 reps Internal Rotation Weight (lbs): 3# Flexion: Strengthening;12 reps Flexion Weight (lbs): 3# Abduction: Strengthening;12 reps ABduction Weight (lbs): 3#   ROM / Strengthening / Isometric Strengthening UBE (Upper Arm Bike): 3' and 3' 3.0 Cybex Press: 2 plate;20 reps Cybex Row: 2 plate;20 reps Wall Wash: 4' with 2# Thumb Tacks: 1' "W" Arms: 10 wtih 3 pounds X to V Arms: 10 with 3 pounds Prot/Ret//Elev/Dep: 1'   Manual Therapy Manual Therapy: Myofascial release Myofascial Release: MFR and manual stretching to right shoulder region including upper arm, biceps, scapular  region, and pectoral region.  [phone]  Occupational Therapy Assessment and Plan OT Assessment and Plan Clinical Impression Statement: A:  Increased to 12 reps with strengthening exercises.  OT Plan: P:  Increase to 3# with wall wash, Increase to 3.5 on UBE.   Goals Short Term Goals Short Term Goal 1: Patient will be I with HEP. Short Term Goal 2: Patient will increase PROM to [facility] in right shoulder for increased ability to comb his hair. Short Term Goal 3: Patient will increase right shoulder strength to 3+/5 for increased ability to put items into overhead cabinets. Short Term Goal 4: Patient will decrease pain to 3/10 in his right shoulder when working. Short Term Goal 5: Patient will decrease fascial restrictions from moderate to minimal in his right shoulder. Long Term Goals Long Term Goal 1: Patient will return to prior level of independence with all B/IADLs, work, lesiure activities. Long Term Goal 2: Patient will increase AROM to WNL for increased independence with reaching overhead and pulling and pushing at work. Long Term Goal 3: Patient will increase strength in his right shoulder to 5/5 for increased independence pulling and pushing at work. Long Term Goal 4: Patient will decrease pain to 1/10 in his right shoulder while working. Long Term Goal 5: Patient will decrease fascial restrictions to trace in his right shoulder. End of Session Patient Active Problem List  Diagnoses  . HYPERLIPIDEMIA  . CARPAL TUNNEL SYNDROME  . CAD, ARTERY BYPASS GRAFT  . TRANSIENT ISCHEMIC ATTACKS, HX OF  . 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 Shoulder pain  . Rotator cuff syndrome  . Rotator cuff tear, right  . Arthritis, shoulder region  . Biceps tendinopathy  . Rotator cuff tendonitis  . S/P arthroscopy of shoulder  . Shoulder arthritis  . Nontraumatic rupture of tendons of biceps (long head)  . Synovitis of shoulder  . Peripheral vascular disease with  claudication  . S/P shoulder  surgery   End of Session Activity Tolerance: Patient tolerated treatment well General Behavior During Session: [facility] for tasks performed Cognition: [facility] for tasks performed   Shirlean Mylar, OTR/L  04/14/2011, 9:35 AM

## 2011-04-18 ENCOUNTER — Ambulatory Visit (HOSPITAL_COMMUNITY)
Admission: RE | Admit: 2011-04-18 | Discharge: 2011-04-18 | Disposition: A | Discharge status: Home / Self Care | Payer: BC Managed Care – PPO | Source: Ambulatory Visit | Attending: Family Medicine | Admitting: Family Medicine

## 2011-04-18 NOTE — Progress Notes (Signed)
Occupational Therapy Treatment  Patient Details  Name: DEMARRIO [name] MRN: [id number] Date of Birth: [birth year]  Today's Date: 04/18/2011 Time: 4098-1191 Time Calculation (min): 45 min Visit#: 16  of 24   Re-eval: 05/10/11 Manuel Therapy [phone] 22' Therapeutic Exercise.  [phone]  22'    Subjective Symptoms/Limitations Symptoms: S:  It is doing ok. Pain Assessment Currently in Pain?: Yes Pain Score:   1 Pain Location: Shoulder Pain Orientation: Right Pain Type: Acute pain   Exercise/Treatments Supine Protraction: PROM;10 reps;Strengthening;15 reps Protraction Weight (lbs): 3# Horizontal ABduction: PROM;10 reps;Strengthening;15 reps Horizontal ABduction Weight (lbs): 3# External Rotation: PROM;10 reps;Strengthening;15 reps External Rotation Weight (lbs): 3# Internal Rotation: PROM;10 reps;Strengthening;15 reps Internal Rotation Weight (lbs): 3# Flexion: PROM;10 reps;Strengthening;15 reps Shoulder Flexion Weight (lbs): 3# ABduction: PROM;10 reps;Strengthening;15 reps Shoulder ABduction Weight (lbs): 3# Seated Extension:  (d/c to hep) Retraction:  (d/c to hep) Protraction: Strengthening;15 reps Protraction Weight (lbs): 3# Horizontal ABduction: Strengthening;15 reps Horizontal ABduction Weight (lbs): 3# External Rotation: Strengthening;15 reps External Rotation Weight (lbs): 3# Internal Rotation: Strengthening;15 reps Internal Rotation Weight (lbs): 3# Flexion: Strengthening;15 reps Flexion Weight (lbs): 3# Abduction: Strengthening;15 reps ABduction Weight (lbs): 3# ROM / Strengthening / Isometric Strengthening UBE (Upper Arm Bike): 3' and 3' 3.5 Cybex Press: 2.5 plate;10 reps Cybex Row: 2.5 plate;10 reps Wall Wash: 2' with 3# Thumb Tacks: 1' "W" Arms: 12 wtih 3 pounds X to V Arms: 12 with 3 pounds Prot/Ret//Elev/Dep: 1'  Manual Therapy Manual Therapy: Myofascial release Myofascial Release: MFR and manual stretching to right shoulder region including  upper arm, biceps, scapular region, and pectoral region.   Occupational Therapy Assessment and Plan OT Assessment and Plan Clinical Impression Statement: A:  Increased cybex to 21/2 plates and wall wash to 3#.  D/C tband to HEP. OT Plan: P: Increase reps with wt'd exercise and cybex.   Goals Short Term Goals Short Term Goal 1: Patient will be I with HEP. Short Term Goal 2: Patient will increase PROM to [facility] in right shoulder for increased ability to comb his hair. Short Term Goal 3: Patient will increase right shoulder strength to 3+/5 for increased ability to put items into overhead cabinets. Short Term Goal 4: Patient will decrease pain to 3/10 in his right shoulder when working. Short Term Goal 5: Patient will decrease fascial restrictions from moderate to minimal in his right shoulder. Long Term Goals Long Term Goal 1: Patient will return to prior level of independence with all B/IADLs, work, lesiure activities. Long Term Goal 2: Patient will increase AROM to WNL for increased independence with reaching overhead and pulling and pushing at work. Long Term Goal 3: Patient will increase strength in his right shoulder to 5/5 for increased independence pulling and pushing at work. Long Term Goal 4: Patient will decrease pain to 1/10 in his right shoulder while working. Long Term Goal 5: Patient will decrease fascial restrictions to trace in his right shoulder. End of Session Patient Active Problem List  Diagnoses  . HYPERLIPIDEMIA  . CARPAL TUNNEL SYNDROME  . CAD, ARTERY BYPASS GRAFT  . TRANSIENT ISCHEMIC ATTACKS, HX OF  . 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 Shoulder pain  . Rotator cuff syndrome  . Rotator cuff tear, right  . Arthritis, shoulder region  . Biceps tendinopathy  . Rotator cuff tendonitis  . S/P arthroscopy of shoulder  . Shoulder arthritis  . Nontraumatic rupture of tendons of biceps (long head)  . Synovitis of shoulder  . Peripheral  vascular disease with claudication  . S/P shoulder surgery   End of Session Activity Tolerance: Patient tolerated treatment well General Behavior During Session: [facility] for tasks performed Cognition: [facility] for tasks performed   Naveena Eyman L. Lennon Boutwell, COTA/L  04/18/2011, 10:12 AM

## 2011-04-19 ENCOUNTER — Ambulatory Visit (HOSPITAL_COMMUNITY): Payer: BC Managed Care – PPO | Admitting: Specialist

## 2011-04-20 ENCOUNTER — Ambulatory Visit (HOSPITAL_COMMUNITY)
Admission: RE | Admit: 2011-04-20 | Discharge: 2011-04-20 | Disposition: A | Discharge status: Home / Self Care | Payer: BC Managed Care – PPO | Source: Ambulatory Visit | Attending: Family Medicine | Admitting: Family Medicine

## 2011-04-20 NOTE — Progress Notes (Signed)
Occupational Therapy Treatment  Patient Details  Name: Devin [name] MRN: [id number] Date of Birth: 30-Jan-1946  Today's Date: 04/20/2011 Time: 4098-1191 Time Calculation (min): 46 min Visit#: 17  of 24   Re-eval: 05/10/11 Manuel Therapy  [phone] 21' Therapeutic Exercise  [phone] 24'    Subjective Symptoms/Limitations Symptoms: S:  I feel pretty good. Pain Assessment Currently in Pain?: Yes Pain Score:   1 Pain Location: Shoulder Pain Orientation: Right Pain Type: Acute pain  Exercise/Treatments Supine Protraction: PROM;10 reps;Strengthening;15 reps Protraction Weight (lbs): 3# Horizontal ABduction: PROM;10 reps;Strengthening;15 reps Horizontal ABduction Weight (lbs): 3# External Rotation: PROM;10 reps;Strengthening;15 reps External Rotation Weight (lbs): 3# Internal Rotation: PROM;10 reps;Strengthening;15 reps Internal Rotation Weight (lbs): 3# Flexion: PROM;10 reps;Strengthening;15 reps Shoulder Flexion Weight (lbs): 3# ABduction: PROM;10 reps;Strengthening;15 reps Shoulder ABduction Weight (lbs): 3# Seated Protraction: Strengthening;15 reps Protraction Weight (lbs): 3# Horizontal ABduction: Strengthening;15 reps Horizontal ABduction Weight (lbs): 3# External Rotation: Strengthening;15 reps External Rotation Weight (lbs): 3# Internal Rotation: Strengthening;15 reps Internal Rotation Weight (lbs): 3# Flexion: Strengthening;15 reps Flexion Weight (lbs): 3# Abduction: Strengthening;15 reps ABduction Weight (lbs): 3# ROM / Strengthening / Isometric Strengthening UBE (Upper Arm Bike): 3' and 3' 4.0 Cybex Press: 2.5 plate;15 reps Cybex Row: 2.5 plate;15 reps Wall Wash: 3' with 3# Thumb Tacks: 1' "W" Arms: 15 wtih 3 pounds X to V Arms: 15 with 3 pounds Prot/Ret//Elev/Dep: 1'  Manual Therapy Manual Therapy: Myofascial release Myofascial Release: MFR and manual stretching to right shoulder region including upper arm, biceps, scapular region, and pectoral  region. Begin scar release next visit   Occupational Therapy Assessment and Plan OT Assessment and Plan Clinical Impression Statement: A:  Increased UBE, time with wall wash and reps with cybex. Rehab Potential: Excellent OT Plan: P:  Increase supine weight to 4#.   Goals Short Term Goals Short Term Goal 1: Patient will be I with HEP. Short Term Goal 2: Patient will increase PROM to [facility] in right shoulder for increased ability to comb his hair. Short Term Goal 3: Patient will increase right shoulder strength to 3+/5 for increased ability to put items into overhead cabinets. Short Term Goal 4: Patient will decrease pain to 3/10 in his right shoulder when working. Short Term Goal 5: Patient will decrease fascial restrictions from moderate to minimal in his right shoulder. Long Term Goals Long Term Goal 1: Patient will return to prior level of independence with all B/IADLs, work, lesiure activities. Long Term Goal 2: Patient will increase AROM to WNL for increased independence with reaching overhead and pulling and pushing at work. Long Term Goal 3: Patient will increase strength in his right shoulder to 5/5 for increased independence pulling and pushing at work. Long Term Goal 4: Patient will decrease pain to 1/10 in his right shoulder while working. Long Term Goal 5: Patient will decrease fascial restrictions to trace in his right shoulder. End of Session Patient Active Problem List  Diagnoses  . HYPERLIPIDEMIA  . CARPAL TUNNEL SYNDROME  . CAD, ARTERY BYPASS GRAFT  . TRANSIENT ISCHEMIC ATTACKS, HX OF  . 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 Shoulder pain  . Rotator cuff syndrome  . Rotator cuff tear, right  . Arthritis, shoulder region  . Biceps tendinopathy  . Rotator cuff tendonitis  . S/P arthroscopy of shoulder  . Shoulder arthritis  . Nontraumatic rupture of tendons of biceps (long head)  . Synovitis of shoulder  . Peripheral vascular disease  with claudication  . S/P shoulder surgery  End of Session Activity Tolerance: Patient tolerated treatment well General Behavior During Session: [facility] for tasks performed Cognition: [facility] for tasks performed   Shakeema Lippman L. Derionna Salvador, COTA/L  04/20/2011, 9:30 AM

## 2011-04-25 ENCOUNTER — Ambulatory Visit (HOSPITAL_COMMUNITY)
Admission: RE | Admit: 2011-04-25 | Discharge: 2011-04-25 | Disposition: A | Discharge status: Home / Self Care | Payer: BC Managed Care – PPO | Source: Ambulatory Visit | Attending: Family Medicine | Admitting: Family Medicine

## 2011-04-25 NOTE — Progress Notes (Signed)
Occupational Therapy Treatment  Patient Details  Name: Devin [name] MRN: [id number] Date of Birth: Oct 14, 1945  Today's Date: 04/25/2011 Time: 4098-1191 Time Calculation (min): 48 min Manual Therapy [phone] 23' Therapeutic Ex [phone] 24' Visit#: 18  of 24   Re-eval: 05/10/11    Subjective Symptoms/Limitations Symptoms: S:  My pain is way less than a one. Pain Assessment Pain Score: 0-No pain  O:  Exercise/Treatments Supine Protraction: PROM;Strengthening;10 reps Protraction Weight (lbs): 4 Horizontal ABduction: PROM;Strengthening;10 reps Horizontal ABduction Weight (lbs): 4 External Rotation: PROM;Strengthening;10 reps External Rotation Weight (lbs): 4 Internal Rotation: PROM;Strengthening;10 reps Internal Rotation Weight (lbs): 4 Flexion: PROM;Strengthening;10 reps Shoulder Flexion Weight (lbs): 4 ABduction: PROM;Strengthening;10 reps Shoulder ABduction Weight (lbs): 4 Seated Protraction: Strengthening;15 reps Protraction Weight (lbs): 3# Horizontal ABduction: Strengthening;15 reps Horizontal ABduction Weight (lbs): 3# External Rotation: Strengthening;15 reps External Rotation Weight (lbs): 3# Internal Rotation: Strengthening;15 reps Internal Rotation Weight (lbs): 3# Flexion: Strengthening;15 reps Flexion Weight (lbs): 3# Abduction: Strengthening;15 reps ABduction Weight (lbs): 3# ROM / Strengthening / Isometric Strengthening UBE (Upper Arm Bike): 3' and 3' 4.0 Cybex Press: 2.5 plate;20 reps Cybex Row: 2.5 plate;20 reps Wall Wash: 3' with 3# Thumb Tacks: 1' "W" Arms: 15 wtih 3 pounds X to V Arms: 15 with 3 pounds Prot/Ret//Elev/Dep: 1'    Manual Therapy Manual Therapy: Myofascial release Myofascial Release: MFR and manual stretching to right shoulder region including upper arm, biceps, scapular region, and pectoral region.  [phone]  Occupational Therapy Assessment and Plan OT Assessment and Plan Clinical Impression Statement: A:  Increased to 4#  with supine strengthening. OT Plan: P:  Attempt 4# in seated, add prone strengthening.   Goals Short Term Goals Short Term Goal 1: Patient will be I with HEP. Short Term Goal 2: Patient will increase PROM to [facility] in right shoulder for increased ability to comb his hair. Short Term Goal 3: Patient will increase right shoulder strength to 3+/5 for increased ability to put items into overhead cabinets. Short Term Goal 4: Patient will decrease pain to 3/10 in his right shoulder when working. Short Term Goal 5: Patient will decrease fascial restrictions from moderate to minimal in his right shoulder. Long Term Goals Long Term Goal 1: Patient will return to prior level of independence with all B/IADLs, work, lesiure activities. Long Term Goal 2: Patient will increase AROM to WNL for increased independence with reaching overhead and pulling and pushing at work. Long Term Goal 3: Patient will increase strength in his right shoulder to 5/5 for increased independence pulling and pushing at work. Long Term Goal 4: Patient will decrease pain to 1/10 in his right shoulder while working. Long Term Goal 5: Patient will decrease fascial restrictions to trace in his right shoulder. End of Session Patient Active Problem List  Diagnoses  . HYPERLIPIDEMIA  . CARPAL TUNNEL SYNDROME  . CAD, ARTERY BYPASS GRAFT  . TRANSIENT ISCHEMIC ATTACKS, HX OF  . 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 Shoulder pain  . Rotator cuff syndrome  . Rotator cuff tear, right  . Arthritis, shoulder region  . Biceps tendinopathy  . Rotator cuff tendonitis  . S/P arthroscopy of shoulder  . Shoulder arthritis  . Nontraumatic rupture of tendons of biceps (long head)  . Synovitis of shoulder  . Peripheral vascular disease with claudication  . S/P shoulder surgery   End of Session Activity Tolerance: Patient tolerated treatment well General Behavior During Session: [facility] for tasks performed Cognition:  [facility] for tasks performed  Shirlean Mylar, OTR/L  04/25/2011, 9:35 AM

## 2011-04-28 ENCOUNTER — Ambulatory Visit (HOSPITAL_COMMUNITY)
Admission: RE | Admit: 2011-04-28 | Discharge: 2011-04-28 | Disposition: A | Discharge status: Home / Self Care | Payer: BC Managed Care – PPO | Source: Ambulatory Visit | Attending: Family Medicine | Admitting: Family Medicine

## 2011-04-28 NOTE — Progress Notes (Signed)
Occupational Therapy Treatment  Patient Details  Name: Devin [name] MRN: [id number] Date of Birth: [birth year]  Today's Date: 04/28/2011 Time: 4098-1191 Time Calculation (min): 43 min Manual Therapy [phone] 16' Therapeutic Exercises [id number] 26' Visit#: 19  of 24   Re-eval: 05/10/11    Subjective Symptoms/Limitations Symptoms: S;  I just have some twinges here and there, but it is ok. Pain Assessment Currently in Pain?: No/denies Pain Score: 0-No pain  O:  Exercise/Treatments Supine Protraction: PROM;10 reps;Strengthening;12 reps Protraction Weight (lbs): 4 Horizontal ABduction: PROM;10 reps;Strengthening;12 reps Horizontal ABduction Weight (lbs): 4 External Rotation: PROM;10 reps;Strengthening;12 reps External Rotation Weight (lbs): 4 Internal Rotation: PROM;10 reps;Strengthening;12 reps Internal Rotation Weight (lbs): 4 Flexion: PROM;10 reps;Strengthening;12 reps Shoulder Flexion Weight (lbs): 4 ABduction: PROM;10 reps;Strengthening;12 reps Shoulder ABduction Weight (lbs): 4 Seated Protraction: Strengthening;10 reps Protraction Weight (lbs): 4 Horizontal ABduction: Strengthening;10 reps Horizontal ABduction Weight (lbs): 4 External Rotation: Strengthening;10 reps External Rotation Weight (lbs): 4 Internal Rotation: Strengthening;10 reps Internal Rotation Weight (lbs): 4 Flexion: Strengthening;10 reps Flexion Weight (lbs): 4 Abduction: Strengthening;10 reps ABduction Weight (lbs): 4 ROM / Strengthening / Isometric Strengthening UBE (Upper Arm Bike): 3' and 3' 4.0 Cybex Press: 2.5 plate;25 reps Cybex Row: 2.5 plate;25 reps Wall Wash: 2' with 4# Thumb Tacks: 1' "W" Arms: 10 wtih 4# X to V Arms: 10 with 4# Prot/Ret//Elev/Dep: 1'   Manual Therapy Manual Therapy: Myofascial release Myofascial Release: MFR and manual stretching to right shoulder region including upper arm, biceps, scapular region, and pectoral region.  [phone]  Occupational Therapy  Assessment and Plan OT Assessment and Plan Clinical Impression Statement: A:  Increased to 4# with seated strengthening and wall wash. OT Plan: P:  DC supine strengthening and add prone strengthening.   Goals Short Term Goals Short Term Goal 1: Patient will be I with HEP. Short Term Goal 2: Patient will increase PROM to [facility] in right shoulder for increased ability to comb his hair. Short Term Goal 3: Patient will increase right shoulder strength to 3+/5 for increased ability to put items into overhead cabinets. Short Term Goal 4: Patient will decrease pain to 3/10 in his right shoulder when working. Short Term Goal 5: Patient will decrease fascial restrictions from moderate to minimal in his right shoulder. Long Term Goals Long Term Goal 1: Patient will return to prior level of independence with all B/IADLs, work, lesiure activities. Long Term Goal 2: Patient will increase AROM to WNL for increased independence with reaching overhead and pulling and pushing at work. Long Term Goal 3: Patient will increase strength in his right shoulder to 5/5 for increased independence pulling and pushing at work. Long Term Goal 4: Patient will decrease pain to 1/10 in his right shoulder while working. Long Term Goal 5: Patient will decrease fascial restrictions to trace in his right shoulder. End of Session Patient Active Problem List  Diagnoses  . HYPERLIPIDEMIA  . CARPAL TUNNEL SYNDROME  . CAD, ARTERY BYPASS GRAFT  . TRANSIENT ISCHEMIC ATTACKS, HX OF  . 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 Shoulder pain  . Rotator cuff syndrome  . Rotator cuff tear, right  . Arthritis, shoulder region  . Biceps tendinopathy  . Rotator cuff tendonitis  . S/P arthroscopy of shoulder  . Shoulder arthritis  . Nontraumatic rupture of tendons of biceps (long head)  . Synovitis of shoulder  . Peripheral vascular disease with claudication  . S/P shoulder surgery   End of  Session Activity Tolerance: Patient tolerated treatment  well General Behavior During Session: [facility] for tasks performed Cognition: [facility] for tasks performed   Shirlean Mylar, OTR/L  04/28/2011, 9:33 AM

## 2011-05-02 ENCOUNTER — Ambulatory Visit (HOSPITAL_COMMUNITY)
Admission: RE | Admit: 2011-05-02 | Discharge: 2011-05-02 | Disposition: A | Discharge status: Home / Self Care | Payer: BC Managed Care – PPO | Source: Ambulatory Visit | Attending: Orthopedic Surgery | Admitting: Orthopedic Surgery

## 2011-05-02 DIAGNOSIS — M6281 Muscle weakness (generalized): Secondary | ICD-10-CM | POA: Insufficient documentation

## 2011-05-02 DIAGNOSIS — IMO0001 Reserved for inherently not codable concepts without codable children: Secondary | ICD-10-CM | POA: Insufficient documentation

## 2011-05-02 DIAGNOSIS — M25619 Stiffness of unspecified shoulder, not elsewhere classified: Secondary | ICD-10-CM | POA: Insufficient documentation

## 2011-05-02 DIAGNOSIS — M25519 Pain in unspecified shoulder: Secondary | ICD-10-CM | POA: Insufficient documentation

## 2011-05-02 NOTE — Progress Notes (Signed)
Occupational Therapy Treatment  Patient Details  Name: Devin [name] MRN: [id number] Date of Birth: [birth year]  Today's Date: 05/02/2011 Time: 2956-2130 Time Calculation (min): 46 min Visit#: 20  of 24   Re-eval: 05/10/11 Manuel Therapy [phone] 69' Therapeutic Exercise  [phone] 29'    Subjective Symptoms/Limitations Symptoms: S:  It hurts just enough to irritate. Pain Assessment Currently in Pain?: No/denies  Exercise/Treatments Supine Protraction: PROM;10 reps Horizontal ABduction: PROM;10 reps External Rotation: PROM;10 reps Internal Rotation: PROM;10 reps Flexion: PROM;10 reps ABduction: PROM;10 reps Seated Protraction: Strengthening;12 reps Protraction Weight (lbs): 4 Horizontal ABduction: Strengthening;12 reps Horizontal ABduction Weight (lbs): 4 External Rotation: Strengthening;12 reps External Rotation Weight (lbs): 4 Internal Rotation: Strengthening;12 reps Internal Rotation Weight (lbs): 4 Flexion: Strengthening;12 reps Flexion Weight (lbs): 4 Abduction: Strengthening;12 reps ABduction Weight (lbs): 4 Prone  Retraction: AROM;10 reps Flexion: AROM;10 reps Extension: AROM;10 reps External Rotation:  (add next visit) Internal Rotation:  (add next visit.) Horizontal ABduction 1: AROM;10 reps Horizontal ABduction 2: AROM;10 reps ROM / Strengthening / Isometric Strengthening UBE (Upper Arm Bike): 3' and 3' 4.0 Cybex Press: 3 plate;15 reps Cybex Row: 3 plate;15 reps Wall Wash: 3' with 4# Thumb Tacks: 1' "W" Arms: 10 wtih 4# X to V Arms: 10 with 4# Prot/Ret//Elev/Dep: 1'   Manual Therapy Manual Therapy: Myofascial release Myofascial Release: MFR and manuel stretching to right shoulder region including upper arm, biceps, scapular region and pectoral region.  Occupational Therapy Assessment and Plan OT Assessment and Plan Clinical Impression Statement: A:  D/C supine strengthening added prone strengthening with no weight.  Patient requested copy of  prone ex to do at home.  D/C tband to HEP. OT Plan: P  Add IR and ER prone.   Goals Short Term Goals Short Term Goal 1: Patient will be I with HEP. Short Term Goal 2: Patient will increase PROM to [facility] in right shoulder for increased ability to comb his hair. Short Term Goal 3: Patient will increase right shoulder strength to 3+/5 for increased ability to put items into overhead cabinets. Short Term Goal 4: Patient will decrease pain to 3/10 in his right shoulder when working. Short Term Goal 5: Patient will decrease fascial restrictions from moderate to minimal in his right shoulder. Long Term Goals Long Term Goal 1: Patient will return to prior level of independence with all B/IADLs, work, lesiure activities. Long Term Goal 2: Patient will increase AROM to WNL for increased independence with reaching overhead and pulling and pushing at work. Long Term Goal 3: Patient will increase strength in his right shoulder to 5/5 for increased independence pulling and pushing at work. Long Term Goal 4: Patient will decrease pain to 1/10 in his right shoulder while working. Long Term Goal 5: Patient will decrease fascial restrictions to trace in his right shoulder. End of Session Patient Active Problem List  Diagnoses  . HYPERLIPIDEMIA  . CARPAL TUNNEL SYNDROME  . CAD, ARTERY BYPASS GRAFT  . TRANSIENT ISCHEMIC ATTACKS, HX OF  . 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 Shoulder pain  . Rotator cuff syndrome  . Rotator cuff tear, right  . Arthritis, shoulder region  . Biceps tendinopathy  . Rotator cuff tendonitis  . S/P arthroscopy of shoulder  . Shoulder arthritis  . Nontraumatic rupture of tendons of biceps (long head)  . Synovitis of shoulder  . Peripheral vascular disease with claudication  . S/P shoulder surgery   End of Session Activity Tolerance: Patient tolerated treatment well General  Behavior During Session: [facility] for tasks performed Cognition: [facility] for  tasks performed   [name], COTA/L  05/02/2011, 9:29 AM

## 2011-05-05 ENCOUNTER — Ambulatory Visit (HOSPITAL_COMMUNITY)
Admission: RE | Admit: 2011-05-05 | Discharge: 2011-05-05 | Disposition: A | Discharge status: Home / Self Care | Payer: BC Managed Care – PPO | Source: Ambulatory Visit | Attending: Family Medicine | Admitting: Family Medicine

## 2011-05-05 NOTE — Progress Notes (Signed)
Occupational Therapy Treatment  Patient Details  Name: Devin [name] MRN: [id number] Date of Birth: Feb 22, 1946  Today's Date: 05/05/2011 Time: 2725-3664 Time Calculation (min): 44 min Manual Therapy [phone] 16' Therapeutic Exercises [phone] 27' Visit#: 21  of 24   Re-eval: 05/10/11    Subjective Symptoms/Limitations Symptoms: S:  It hurts just like a toothache. Pain Assessment Currently in Pain?: No/denies  O:  Exercise/Treatments Supine Protraction: PROM;10 reps Horizontal ABduction: PROM;10 reps External Rotation: PROM;10 reps Internal Rotation: PROM;10 reps Flexion: PROM;10 reps ABduction: PROM;10 reps Seated Protraction: Strengthening;12 reps Protraction Weight (lbs): 4 Horizontal ABduction: Strengthening;12 reps Horizontal ABduction Weight (lbs): 4 External Rotation: Strengthening;12 reps External Rotation Weight (lbs): 4 Internal Rotation: Strengthening;12 reps Internal Rotation Weight (lbs): 4 Flexion: Strengthening;12 reps Flexion Weight (lbs): 4 Abduction: Strengthening;12 reps ABduction Weight (lbs): 4 Prone  Retraction: AROM;10 reps Flexion: AROM;10 reps Extension: AROM;10 reps External Rotation: AROM;10 reps Internal Rotation: AROM;10 reps Horizontal ABduction 1: AROM;10 reps Horizontal ABduction 2: AROM;10 reps ROM / Strengthening / Isometric Strengthening UBE (Upper Arm Bike): 3' and 3' 4.5 Cybex Press: 3 plate;15 reps Cybex Row: 3 plate;15 reps Wall Wash: 3' with 4# Thumb Tacks: 1' "W" Arms: 12 with 4# X to V Arms: 12 with 4# Prot/Ret//Elev/Dep: 1'   Manual Therapy Manual Therapy: Myofascial release Myofascial Release: MFR and manual stretching to right shoulder region including upper arm, biceps, scapular region and pectoral region to decrease pain and increase mobility. [phone]  Occupational Therapy Assessment and Plan OT Assessment and Plan Clinical Impression Statement: A:  Assessed prone AROM, is completing with good form. OT  Plan: P:  Add 1 pound to prone exercises.   Goals Short Term Goals Short Term Goal 1: Patient will be I with HEP. Short Term Goal 2: Patient will increase PROM to [facility] in right shoulder for increased ability to comb his hair. Short Term Goal 3: Patient will increase right shoulder strength to 3+/5 for increased ability to put items into overhead cabinets. Short Term Goal 4: Patient will decrease pain to 3/10 in his right shoulder when working. Short Term Goal 5: Patient will decrease fascial restrictions from moderate to minimal in his right shoulder. Long Term Goals Long Term Goal 1: Patient will return to prior level of independence with all B/IADLs, work, lesiure activities. Long Term Goal 2: Patient will increase AROM to WNL for increased independence with reaching overhead and pulling and pushing at work. Long Term Goal 3: Patient will increase strength in his right shoulder to 5/5 for increased independence pulling and pushing at work. Long Term Goal 4: Patient will decrease pain to 1/10 in his right shoulder while working. Long Term Goal 5: Patient will decrease fascial restrictions to trace in his right shoulder. End of Session Patient Active Problem List  Diagnoses  . HYPERLIPIDEMIA  . CARPAL TUNNEL SYNDROME  . CAD, ARTERY BYPASS GRAFT  . TRANSIENT ISCHEMIC ATTACKS, HX OF  . 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 Shoulder pain  . Rotator cuff syndrome  . Rotator cuff tear, right  . Arthritis, shoulder region  . Biceps tendinopathy  . Rotator cuff tendonitis  . S/P arthroscopy of shoulder  . Shoulder arthritis  . Nontraumatic rupture of tendons of biceps (long head)  . Synovitis of shoulder  . Peripheral vascular disease with claudication  . S/P shoulder surgery   General Behavior During Session: [facility] for tasks performed Cognition: [facility] for tasks performed   Shirlean Mylar, OTR/L  05/05/2011, 9:15  AM

## 2011-05-09 ENCOUNTER — Ambulatory Visit (HOSPITAL_COMMUNITY)
Admission: RE | Admit: 2011-05-09 | Discharge: 2011-05-09 | Disposition: A | Discharge status: Home / Self Care | Payer: BC Managed Care – PPO | Source: Ambulatory Visit | Attending: Family Medicine | Admitting: Family Medicine

## 2011-05-09 NOTE — Progress Notes (Signed)
Occupational Therapy Treatment  Patient Details  Name: BLAIDEN WERTH MRN: [id number] Date of Birth: [birth year]  Today's Date: 05/09/2011 Time: 4098-1191 Time Calculation (min): 54 min Visit#: 22  of 24   Re-eval: 06/06/11 Manual Therapy [phone] 21' Re-eval [phone] 10' Therapeutic Exercise [phone] 21'    Subjective Symptoms/Limitations Symptoms: S:  It is just a irritant.  Just like a toothache. Pain Assessment Currently in Pain?: No/denies   Exercise/Treatments Supine Protraction: PROM;10 reps Horizontal ABduction: PROM;10 reps External Rotation: PROM;10 reps Internal Rotation: PROM;10 reps Flexion: PROM;10 reps ABduction: PROM;10 reps Seated Protraction: Strengthening;15 reps Protraction Weight (lbs): 4 Horizontal ABduction: Strengthening;15 reps Horizontal ABduction Weight (lbs): 4 External Rotation: Strengthening;15 reps External Rotation Weight (lbs): 4 Internal Rotation: Strengthening;15 reps Internal Rotation Weight (lbs): 4 Flexion: Strengthening;15 reps Flexion Weight (lbs): 4 Abduction: Strengthening;15 reps ABduction Weight (lbs): 4 Prone  Retraction: AROM;12 reps Flexion: AROM;12 reps Extension: AROM;12 reps External Rotation: AROM;12 reps Internal Rotation: AROM;12 reps Horizontal ABduction 1: AROM;12 reps Horizontal ABduction 2: AROM;12 reps ROM / Strengthening / Isometric Strengthening UBE (Upper Arm Bike): 3' and 3' 5.0 Cybex Press: 3 plate;20 reps Cybex Row: 3 plate;20 reps Wall Wash: 4' with 4# Thumb Tacks: 1' "W" Arms: 12 with 4# X to V Arms: 12 with 4# Prot/Ret//Elev/Dep: 1'    Manual Therapy Manual Therapy: Myofascial release Myofascial Release: MFR and manual stretching to right shoulder region including upper arm, biceps, scapular region, and pectoral region.  Occupational Therapy Assessment and Plan OT Assessment and Plan Clinical Impression Statement: A:  See progress note. OT Plan: P:  Add 1# to prone exercises.   Continue 2x a week for 4 weeks.   Goals Short Term Goals Short Term Goal 1: Patient will be I with HEP. Short Term Goal 1 Progress: Met Short Term Goal 2: Patient will increase PROM to [facility] in right shoulder for increased ability to comb his hair. Short Term Goal 2 Progress: Met Short Term Goal 3: Patient will increase right shoulder strength to 3+/5 for increased ability to put items into overhead cabinets. Short Term Goal 4: Patient will decrease pain to 3/10 in his right shoulder when working. Short Term Goal 4 Progress: Met Short Term Goal 5: Patient will decrease fascial restrictions from moderate to minimal in his right shoulder. Short Term Goal 5 Progress: Met Long Term Goals Long Term Goal 1: Patient will return to prior level of independence with all B/IADLs, work, lesiure activities. Long Term Goal 1 Progress: Progressing toward goal Long Term Goal 2: Patient will increase AROM to WNL for increased independence with reaching overhead and pulling and pushing at work. Long Term Goal 2 Progress: Progressing toward goal Long Term Goal 3: Patient will increase strength in his right shoulder to 5/5 for increased independence pulling and pushing at work. Long Term Goal 3 Progress: Progressing toward goal Long Term Goal 4: Patient will decrease pain to 1/10 in his right shoulder while working. Long Term Goal 4 Progress: Met Long Term Goal 5: Patient will decrease fascial restrictions to trace in his right shoulder. Long Term Goal 5 Progress: Met End of Session Patient Active Problem List  Diagnoses  . HYPERLIPIDEMIA  . CARPAL TUNNEL SYNDROME  . CAD, ARTERY BYPASS GRAFT  . TRANSIENT ISCHEMIC ATTACKS, HX OF  . 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 Shoulder pain  . Rotator cuff syndrome  . Rotator cuff tear, right  . Arthritis, shoulder region  . Biceps tendinopathy  . Rotator cuff  tendonitis  . S/P arthroscopy of shoulder  . Shoulder arthritis  .  Nontraumatic rupture of tendons of biceps (long head)  . Synovitis of shoulder  . Peripheral vascular disease with claudication  . S/P shoulder surgery   End of Session Activity Tolerance: Patient tolerated treatment well General Behavior During Session: [facility] for tasks performed Cognition: [facility] for tasks performed  Blakeleigh Domek L. Tiphani Mells, COTA/L  05/09/2011, 10:20 AM

## 2011-05-10 ENCOUNTER — Ambulatory Visit (INDEPENDENT_AMBULATORY_CARE_PROVIDER_SITE_OTHER): Payer: BC Managed Care – PPO | Admitting: Orthopedic Surgery

## 2011-05-10 ENCOUNTER — Other Ambulatory Visit: Payer: BC Managed Care – PPO | Admitting: Orthopedic Surgery

## 2011-05-10 ENCOUNTER — Encounter: Payer: Self-pay | Admitting: Orthopedic Surgery

## 2011-05-10 DIAGNOSIS — Z9889 Other specified postprocedural states: Secondary | ICD-10-CM

## 2011-05-10 DIAGNOSIS — M67919 Unspecified disorder of synovium and tendon, unspecified shoulder: Secondary | ICD-10-CM

## 2011-05-10 DIAGNOSIS — M75100 Unspecified rotator cuff tear or rupture of unspecified shoulder, not specified as traumatic: Secondary | ICD-10-CM

## 2011-05-10 NOTE — Progress Notes (Signed)
Postop, he is status post rotator cuff repair approximately 4 months ago the patient has made excellent gains in his range of motion and strength still has a little bit of a deficit in both  He would like him to continue physical therapy try to return to work in January if he can do it.  If not we will place him on long-term disability for full 6-9 months.

## 2011-05-10 NOTE — Patient Instructions (Signed)
Give OOW note and RTW note

## 2011-05-12 ENCOUNTER — Ambulatory Visit (HOSPITAL_COMMUNITY)
Admission: RE | Admit: 2011-05-12 | Discharge: 2011-05-12 | Disposition: A | Discharge status: Home / Self Care | Payer: BC Managed Care – PPO | Source: Ambulatory Visit | Attending: Family Medicine | Admitting: Family Medicine

## 2011-05-12 NOTE — Progress Notes (Signed)
Occupational Therapy Treatment  Patient Details  Name: Devin [name] MRN: [id number] Date of Birth: 06-Jun-1945  Today's Date: 05/12/2011 Time: 2130-8657 Time Calculation (min): 55 min Manual Therapy [phone] Therapeutic Exercises [phone]' Visit#: 23  of 32   Re-eval: 06/05/10    Subjective Symptoms/Limitations Symptoms: S: I tried the one task at work where I have to reach way overhead, and I lack about 2". Limitations: Requested patient have employer measure the exact height of the above mentioned task so that we may practice in the clinic. Pain Assessment Currently in Pain?: No/denies Pain Score: 0-No pain  Exercise/Treatments Supine Protraction: PROM;10 reps Horizontal ABduction: PROM;10 reps External Rotation: PROM;10 reps Internal Rotation: PROM;10 reps Flexion: PROM;10 reps ABduction: PROM;10 reps Seated Protraction: Strengthening;15 reps Protraction Weight (lbs): 4 Horizontal ABduction: Strengthening;15 reps Horizontal ABduction Weight (lbs): 4 External Rotation: Strengthening;15 reps External Rotation Weight (lbs): 4 Internal Rotation: Strengthening;15 reps Internal Rotation Weight (lbs): 4 Flexion: Strengthening;15 reps Flexion Weight (lbs): 4 Abduction: Strengthening;15 reps ABduction Weight (lbs): 4 Prone  Retraction: Strengthening;10 reps Retraction Weight (lbs): 2 Flexion: Strengthening;10 reps Flexion Weight (lbs): 2 Extension: Strengthening;10 reps Extension Weight (lbs): 2 External Rotation: Strengthening;10 reps External Rotation Weight (lbs): 2 Internal Rotation: Strengthening;10 reps Internal Rotation Weight (lbs): 2 Horizontal ABduction 1: Strengthening;10 reps Horizontal ABduction 1 Weight (lbs): 2 Horizontal ABduction 2: Strengthening;10 reps Horizontal ABduction 2 Weight (lbs): 2 ROM / Strengthening / Isometric Strengthening UBE (Upper Arm Bike): 3' and 3' 5.0 Cybex Press: 3 plate;20 reps Cybex Row: 3 plate;20 reps Wall  Wash: 4' with 4# Thumb Tacks: dc Wall Pushups: 10 reps "W" Arms: 15 with 4# X to V Arms: 15 wtih 4# Prot/Ret//Elev/Dep: dc Other ROM/Strengthening Exercises: Reaching overhead to thread machine with 1# weight on his wrist x 10 reps   Manual Therapy Manual Therapy: Myofascial release Myofascial Release: MFR and manual stretching to right shoulder region including upper arm, biceps, scapular region, and pectoral region.  Positioned into extreme flexion and had patient hold Ily x 5" x 5 reps.  [phone]  Occupational Therapy Assessment and Plan OT Assessment and Plan Clinical Impression Statement: A:  Added work-hardening tasks of reaching overhead and wall pushups. OT Plan: P:  Increase ability to reach overhead with workhardening by 1".   Goals Short Term Goals Short Term Goal 1: Patient will be I with HEP. Short Term Goal 2: Patient will increase PROM to [facility] in right shoulder for increased ability to comb his hair. Short Term Goal 3: Patient will increase right shoulder strength to 3+/5 for increased ability to put items into overhead cabinets. Short Term Goal 4: Patient will decrease pain to 3/10 in his right shoulder when working. Short Term Goal 5: Patient will decrease fascial restrictions from moderate to minimal in his right shoulder. Long Term Goals Long Term Goal 1: Patient will return to prior level of independence with all B/IADLs, work, lesiure activities. Long Term Goal 2: Patient will increase AROM to WNL for increased independence with reaching overhead and pulling and pushing at work. Long Term Goal 3: Patient will increase strength in his right shoulder to 5/5 for increased independence pulling and pushing at work. Long Term Goal 4: Patient will decrease pain to 1/10 in his right shoulder while working. Long Term Goal 5: Patient will decrease fascial restrictions to trace in his right shoulder. End of Session Patient Active Problem List  Diagnoses  . HYPERLIPIDEMIA  .  CARPAL TUNNEL SYNDROME  . CAD, ARTERY BYPASS GRAFT  . TRANSIENT  ISCHEMIC ATTACKS, HX OF  . 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 Shoulder pain  . Rotator cuff syndrome  . Rotator cuff tear, right  . Arthritis, shoulder region  . Biceps tendinopathy  . Rotator cuff tendonitis  . S/P arthroscopy of shoulder  . Shoulder arthritis  . Nontraumatic rupture of tendons of biceps (long head)  . Synovitis of shoulder  . Peripheral vascular disease with claudication  . S/P shoulder surgery   End of Session Activity Tolerance: Patient tolerated treatment well General Behavior During Session: [facility] for tasks performed Cognition: [facility] for tasks performed   Shirlean Mylar, OTR/L  05/12/2011, 9:13 AM

## 2011-05-16 ENCOUNTER — Ambulatory Visit (HOSPITAL_COMMUNITY)
Admission: RE | Admit: 2011-05-16 | Discharge: 2011-05-16 | Disposition: A | Discharge status: Home / Self Care | Payer: BC Managed Care – PPO | Source: Ambulatory Visit | Attending: Family Medicine | Admitting: Family Medicine

## 2011-05-16 NOTE — Progress Notes (Signed)
Occupational Therapy Treatment  Patient Details  Name: Devin [name] MRN: [id number] Date of Birth: [birth year]  Today's Date: 05/16/2011 Time: 7829-5621 Time Calculation (min): 49 min Manual Therapy [phone] 16' Therapeutic Exercises [phone]' Visit#: 24  of 32   Re-eval: 06/05/10    Subjective Symptoms/Limitations Symptoms: S:  Im doing ok. Pain Assessment Currently in Pain?: No/denies Pain Score: 0-No pain  O:  Exercise/Treatments Supine Protraction: PROM;10 reps Horizontal ABduction: PROM;10 reps External Rotation: PROM;10 reps Internal Rotation: PROM;10 reps Flexion: PROM;10 reps ABduction: PROM;10 reps Seated Protraction: Strengthening;15 reps Protraction Weight (lbs): 4 Horizontal ABduction: Strengthening;15 reps Horizontal ABduction Weight (lbs): 4 External Rotation: Strengthening;15 reps External Rotation Weight (lbs): 4 Internal Rotation: Strengthening;15 reps Internal Rotation Weight (lbs): 4 Flexion: Strengthening;15 reps Flexion Weight (lbs): 4 Abduction: Strengthening;15 reps ABduction Weight (lbs): 4 Prone  Retraction: Strengthening;10 reps Retraction Weight (lbs): 3 Flexion: Strengthening;10 reps Flexion Weight (lbs): 3 Extension: Strengthening;10 reps Extension Weight (lbs): 3 External Rotation: Strengthening;10 reps External Rotation Weight (lbs): 3 Internal Rotation: Strengthening;10 reps Internal Rotation Weight (lbs): 3 Horizontal ABduction 1: Strengthening;10 reps Horizontal ABduction 1 Weight (lbs): 3 Horizontal ABduction 2: Strengthening;10 reps Horizontal ABduction 2 Weight (lbs): 3 ROM / Strengthening / Isometric Strengthening UBE (Upper Arm Bike): 3' and 3' 5.0 Cybex Press: 3 plate;25 reps Cybex Row: 3 plate;25 reps Wall Wash: 4' with 4# Wall Pushups: 15 reps "W" Arms: 15 with 4# X to V Arms: 15 wtih 4# Other ROM/Strengthening Exercises: Reaching overhead to thread machine with 1# weight on his wrist x 10 reps   Manual  Therapy Manual Therapy: Myofascial release Myofascial Release: MFR and manual stretching to right shoulder region, including upper arm, biceps, scapular region, and pectoral region.  [phone]  Occupational Therapy Assessment and Plan OT Assessment and Plan Clinical Impression Statement: A:  Increased ability to reach overhead with work hardening activities. OT Plan:  P:  Increase ability to reach overhead with 2# on arm.    Goals Short Term Goals Short Term Goal 1: Patient will be I with HEP. Short Term Goal 2: Patient will increase PROM to [facility] in right shoulder for increased ability to comb his hair. Short Term Goal 3: Patient will increase right shoulder strength to 3+/5 for increased ability to put items into overhead cabinets. Short Term Goal 4: Patient will decrease pain to 3/10 in his right shoulder when working. Short Term Goal 5: Patient will decrease fascial restrictions from moderate to minimal in his right shoulder. Long Term Goals Long Term Goal 1: Patient will return to prior level of independence with all B/IADLs, work, lesiure activities. Long Term Goal 1 Progress: Progressing toward goal Long Term Goal 2: Patient will increase AROM to WNL for increased independence with reaching overhead and pulling and pushing at work. Long Term Goal 2 Progress: Progressing toward goal Long Term Goal 3: Patient will increase strength in his right shoulder to 5/5 for increased independence pulling and pushing at work. Long Term Goal 3 Progress: Progressing toward goal Long Term Goal 4: Patient will decrease pain to 1/10 in his right shoulder while working. Long Term Goal 4 Progress: Met Long Term Goal 5: Patient will decrease fascial restrictions to trace in his right shoulder. Long Term Goal 5 Progress: Met End of Session Patient Active Problem List  Diagnoses  . HYPERLIPIDEMIA  . CARPAL TUNNEL SYNDROME  . CAD, ARTERY BYPASS GRAFT  . TRANSIENT ISCHEMIC ATTACKS, HX OF  . Disorders of  bursae and tendons in shoulder region, unspecified  .  Shoulder pain  . Rotator cuff syndrome  . Rotator cuff tear, right  . Arthritis, shoulder region  . Biceps tendinopathy  . Rotator cuff tendonitis  . S/P arthroscopy of shoulder  . Shoulder arthritis  . Nontraumatic rupture of tendons of biceps (long head)  . Synovitis of shoulder  . Peripheral vascular disease with claudication  . S/P shoulder surgery   End of Session Activity Tolerance: Patient tolerated treatment well General Behavior During Session: [facility] for tasks performed Cognition: [facility] for tasks performed   Shirlean Mylar, OTR/L  05/16/2011, 10:18 AM

## 2011-05-17 ENCOUNTER — Telehealth: Payer: Self-pay | Admitting: Orthopedic Surgery

## 2011-05-17 NOTE — Telephone Encounter (Signed)
Spoke with Amy B, [employer] disability Office manager.  She confirmed receiving the medical office notes and work notes that were originally faxed 05/10/11, and re-faxed 05/16/11.

## 2011-05-20 ENCOUNTER — Ambulatory Visit (HOSPITAL_COMMUNITY)
Admission: RE | Admit: 2011-05-20 | Discharge: 2011-05-20 | Disposition: A | Discharge status: Home / Self Care | Payer: BC Managed Care – PPO | Source: Ambulatory Visit | Attending: Occupational Therapy | Admitting: Occupational Therapy

## 2011-05-20 NOTE — Progress Notes (Signed)
Occupational Therapy Treatment  Patient Details  Name: Devin [name] MRN: [id number] Date of Birth: 1945-06-01  Today's Date: 05/20/2011 Time: 4098-1191 Time Calculation (min): 44 min Visit#: 25  of 32   Re-eval: 06/06/11 Manual Therapy [phone] 15' Therapeutic Exercise [phone] 28    Subjective Symptoms/Limitations Symptoms: S:  I have been stretching it.  It feels pretty good today. Pain Assessment Currently in Pain?: No/denies   Exercise/Treatments Supine Protraction: PROM;10 reps Horizontal ABduction: PROM;10 reps External Rotation: PROM;10 reps Internal Rotation: PROM;10 reps Flexion: PROM;10 reps ABduction: PROM;10 reps Seated Protraction: Strengthening;10 reps Protraction Weight (lbs): 4 Horizontal ABduction: Strengthening;10 reps Horizontal ABduction Weight (lbs): 4 External Rotation: Strengthening;10 reps External Rotation Weight (lbs): 4 Internal Rotation: Strengthening;10 reps Internal Rotation Weight (lbs): 4 Flexion: Strengthening;10 reps Flexion Weight (lbs): 4 Abduction: Strengthening;10 reps ABduction Weight (lbs): 4 Prone  Retraction: Strengthening;10 reps Retraction Weight (lbs): 3 Flexion: Strengthening;10 reps Flexion Weight (lbs): 3 Extension: Strengthening;10 reps Extension Weight (lbs): 3 External Rotation: Strengthening;10 reps External Rotation Weight (lbs): 3 Internal Rotation: Strengthening;10 reps Internal Rotation Weight (lbs): 3 Horizontal ABduction 1: Strengthening;10 reps Horizontal ABduction 1 Weight (lbs): 3 Horizontal ABduction 2: Strengthening;10 reps Horizontal ABduction 2 Weight (lbs): 3 ROM / Strengthening / Isometric Strengthening UBE (Upper Arm Bike): 3' and 3' 5.0 Cybex Press: 3 plate;25 reps Cybex Row: 3 plate;25 reps Wall Wash: 4' with 4# Over Head Lace: 2' with 2# Wall Pushups: 15 reps "W" Arms: 10 with 5# X to V Arms: 10 with 5# Other ROM/Strengthening Exercises: Reaching overhead to thread machine with  2# weight on his wrist x 10 reps     Manual Therapy Manual Therapy: Myofascial release Myofascial Release: MFR and manual stretching to right shoulder region including upper arm, biceps, scapular region, and pectoral region.   Occupational Therapy Assessment and Plan OT Assessment and Plan Clinical Impression Statement: A:  Added chain weave and increased overhead reach weight to 2#, increased seated to 5#. Rehab Potential: Excellent OT Plan: P:  Increase reps with strengthening.   Goals Short Term Goals Short Term Goal 1: Patient will be I with HEP. Short Term Goal 2: Patient will increase PROM to [facility] in right shoulder for increased ability to comb his hair. Short Term Goal 3: Patient will increase right shoulder strength to 3+/5 for increased ability to put items into overhead cabinets. Short Term Goal 4: Patient will decrease pain to 3/10 in his right shoulder when working. Short Term Goal 5: Patient will decrease fascial restrictions from moderate to minimal in his right shoulder. Long Term Goals Long Term Goal 1: Patient will return to prior level of independence with all B/IADLs, work, lesiure activities. Long Term Goal 2: Patient will increase AROM to WNL for increased independence with reaching overhead and pulling and pushing at work. Long Term Goal 3: Patient will increase strength in his right shoulder to 5/5 for increased independence pulling and pushing at work. Long Term Goal 4: Patient will decrease pain to 1/10 in his right shoulder while working. Long Term Goal 5: Patient will decrease fascial restrictions to trace in his right shoulder. End of Session Patient Active Problem List  Diagnoses  . HYPERLIPIDEMIA  . CARPAL TUNNEL SYNDROME  . CAD, ARTERY BYPASS GRAFT  . TRANSIENT ISCHEMIC ATTACKS, HX OF  . 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 Shoulder pain  . Rotator cuff syndrome  . Rotator cuff tear, right  . Arthritis, shoulder region    . Biceps tendinopathy  .  Rotator cuff tendonitis  . S/P arthroscopy of shoulder  . Shoulder arthritis  . Nontraumatic rupture of tendons of biceps (long head)  . Synovitis of shoulder  . Peripheral vascular disease with claudication  . S/P shoulder surgery   End of Session Activity Tolerance: Patient tolerated treatment well General Behavior During Session: [facility] for tasks performed Cognition: [facility] for tasks performed   Shahara Hartsfield L. Alveda Vanhorne, COTA/L  05/20/2011, 10:15 AM

## 2011-05-23 ENCOUNTER — Ambulatory Visit (HOSPITAL_COMMUNITY)
Admission: RE | Admit: 2011-05-23 | Discharge: 2011-05-23 | Disposition: A | Discharge status: Home / Self Care | Payer: BC Managed Care – PPO | Source: Ambulatory Visit | Attending: Family Medicine | Admitting: Family Medicine

## 2011-05-23 NOTE — Progress Notes (Signed)
Occupational Therapy Treatment  Patient Details  Name: Devin [name] MRN: [id number] Date of Birth: 1946/02/01  Today's Date: 05/23/2011 Time: 4098-1191 Time Calculation (min): 52 min Visit#: 26  of 32   Re-eval: 06/06/11 Manual Therapy [phone] 20' Therapeutic Exercise [phone]  31'    Subjective Symptoms/Limitations Symptoms: S:  I am ready for my early morning stretch. Pain Assessment Currently in Pain?: No/denies Pain Score: 0-No pain   Exercise/Treatments Supine Protraction: PROM;10 reps Horizontal ABduction: PROM;10 reps External Rotation: PROM;10 reps Internal Rotation: PROM;10 reps Flexion: PROM;10 reps ABduction: PROM;10 reps Seated Protraction: Strengthening;12 reps Protraction Weight (lbs): 5# Horizontal ABduction: Strengthening;12 reps Horizontal ABduction Weight (lbs): 5# External Rotation: Strengthening;12 reps External Rotation Weight (lbs): 5# Internal Rotation: Strengthening;12 reps Internal Rotation Weight (lbs): 5# Flexion: Strengthening;12 reps Flexion Weight (lbs): 5# Abduction: Strengthening;12 reps ABduction Weight (lbs): 5# Prone  Retraction: Strengthening;12 reps Retraction Weight (lbs): 3 Flexion: Strengthening;12 reps Flexion Weight (lbs): 3 Extension: Strengthening;12 reps Extension Weight (lbs): 3 External Rotation: Strengthening;12 reps External Rotation Weight (lbs): 3 Internal Rotation: Strengthening;12 reps Internal Rotation Weight (lbs): 3 Horizontal ABduction 1: Strengthening;12 reps Horizontal ABduction 1 Weight (lbs): 3 Horizontal ABduction 2: Strengthening;12 reps Horizontal ABduction 2 Weight (lbs): 3 ROM / Strengthening / Isometric Strengthening UBE (Upper Arm Bike): 3' and 3' 5.0 Cybex Press:  (4 plates x 10) Cybex Row:  (4 plates x 10) Wall Wash: 2' with 4# Over Head Lace: 2' with 2# Wall Pushups: 15 reps "W" Arms: 10 with 5# X to V Arms: 10 with 5# Other ROM/Strengthening Exercises: Reaching overhead to  thread machine with 2# weight on his wrist x 15 reps   Manual Therapy Manual Therapy: Myofascial release Myofascial Release: MFR and manual stretching to right shoulder region including upper arm, biceps, scapular region, and pectoral region to decrease pain and restrictions and increase pain free movement.  [phone].  completed by Sondra Barges, OTR/L  Occupational Therapy Assessment and Plan OT Assessment and Plan Clinical Impression Statement: A:  Increased weight on seated ex and cybex. Rehab Potential: Excellent OT Plan: P:  Increase reps.   Goals Short Term Goals Short Term Goal 1: Patient will be I with HEP. Short Term Goal 2: Patient will increase PROM to [facility] in right shoulder for increased ability to comb his hair. Short Term Goal 3: Patient will increase right shoulder strength to 3+/5 for increased ability to put items into overhead cabinets. Short Term Goal 4: Patient will decrease pain to 3/10 in his right shoulder when working. Short Term Goal 5: Patient will decrease fascial restrictions from moderate to minimal in his right shoulder. Long Term Goals Long Term Goal 1: Patient will return to prior level of independence with all B/IADLs, work, lesiure activities. Long Term Goal 2: Patient will increase AROM to WNL for increased independence with reaching overhead and pulling and pushing at work. Long Term Goal 3: Patient will increase strength in his right shoulder to 5/5 for increased independence pulling and pushing at work. Long Term Goal 4: Patient will decrease pain to 1/10 in his right shoulder while working. Long Term Goal 5: Patient will decrease fascial restrictions to trace in his right shoulder. End of Session Patient Active Problem List  Diagnoses  . HYPERLIPIDEMIA  . CARPAL TUNNEL SYNDROME  . CAD, ARTERY BYPASS GRAFT  . TRANSIENT ISCHEMIC ATTACKS, HX OF  . 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 Shoulder pain  . Rotator cuff  syndrome  . Rotator cuff tear,  right  . Arthritis, shoulder region  . Biceps tendinopathy  . Rotator cuff tendonitis  . S/P arthroscopy of shoulder  . Shoulder arthritis  . Nontraumatic rupture of tendons of biceps (long head)  . Synovitis of shoulder  . Peripheral vascular disease with claudication  . S/P shoulder surgery   End of Session Activity Tolerance: Patient tolerated treatment well General Behavior During Session: [facility] for tasks performed  . Denee Boeder L. Tunya Held, COTA/L  05/23/2011, 8:54 AM

## 2011-05-27 ENCOUNTER — Ambulatory Visit (HOSPITAL_COMMUNITY)
Admission: RE | Admit: 2011-05-27 | Discharge: 2011-05-27 | Disposition: A | Discharge status: Home / Self Care | Payer: BC Managed Care – PPO | Source: Ambulatory Visit | Attending: Family Medicine | Admitting: Family Medicine

## 2011-05-27 NOTE — Progress Notes (Signed)
Occupational Therapy Treatment  Patient Details  Name: Devin [name] MRN: [id number] Date of Birth: 10-31-1945  Today's Date: 05/27/2011 Time: 4098-1191 Time Calculation (min): 44 min Manual Therapy [phone] 18' Therapeutic Exercises [phone] 26' Visit#: 27  of 32   Re-eval: 06/06/11    Subjective Symptoms/Limitations Symptoms: S:  I am worried about going back to work. Pain Assessment Currently in Pain?: No/denies Pain Score: 0-No pain  O:  Exercise/Treatments Supine Protraction: PROM;10 reps Horizontal ABduction: PROM;10 reps External Rotation: PROM;10 reps Internal Rotation: PROM;10 reps Flexion: PROM;10 reps ABduction: PROM;10 reps Seated Protraction: Strengthening;15 reps Protraction Weight (lbs): 5 Horizontal ABduction: Strengthening;15 reps Horizontal ABduction Weight (lbs): 5 External Rotation: Strengthening;15 reps External Rotation Weight (lbs): 5# Internal Rotation: Strengthening;15 reps Internal Rotation Weight (lbs): 5 Flexion: Strengthening;15 reps Flexion Weight (lbs): 5 Abduction: Strengthening;15 reps ABduction Weight (lbs): 5 Prone  Retraction: Strengthening;15 reps Retraction Weight (lbs): 3 Flexion: Strengthening;15 reps Flexion Weight (lbs): 3 Extension: Strengthening;15 reps Extension Weight (lbs): 3 External Rotation: Strengthening;15 reps External Rotation Weight (lbs): 3 Internal Rotation: Strengthening;15 reps Internal Rotation Weight (lbs): 3 Horizontal ABduction 1: Strengthening;15 reps Horizontal ABduction 1 Weight (lbs): 3 Horizontal ABduction 2: Strengthening;15 reps Horizontal ABduction 2 Weight (lbs): 3 ROM / Strengthening / Isometric Strengthening UBE (Upper Arm Bike): 3' and 3' 5.0 Cybex Press:  (4 plates x 10) Cybex Row:  (4 plates x 10) Wall Wash: 2' with 4# Wall Pushups: 20 reps "W" Arms: 12 with 5# X to V Arms: 12 with 5# Other ROM/Strengthening Exercises: Reaching overhead to thread machine with 5# weight on  his wrist x 15 reps   Manual Therapy Manual Therapy: Myofascial release Myofascial Release: MFR and manual stretching to right shoulder region including upper arm, biceps, scapular region, and pectoral region to decrease pain and restrictions and increase pain free movement.  [phone]  Occupational Therapy Assessment and Plan OT Assessment and Plan Clinical Impression Statement: A:  Able to reach level 2 on theraband wall with 5# weight on his wrist x 15 reps. OT Plan: P:  Increase ease with seated strengthening exercises with 5# resisitance.   Goals Short Term Goals Short Term Goal 1: Patient will be I with HEP. Short Term Goal 2: Patient will increase PROM to [facility] in right shoulder for increased ability to comb his hair. Short Term Goal 3: Patient will increase right shoulder strength to 3+/5 for increased ability to put items into overhead cabinets. Short Term Goal 4: Patient will decrease pain to 3/10 in his right shoulder when working. Short Term Goal 5: Patient will decrease fascial restrictions from moderate to minimal in his right shoulder. Long Term Goals Long Term Goal 1: Patient will return to prior level of independence with all B/IADLs, work, lesiure activities. Long Term Goal 1 Progress: Progressing toward goal Long Term Goal 2: Patient will increase AROM to WNL for increased independence with reaching overhead and pulling and pushing at work. Long Term Goal 2 Progress: Progressing toward goal Long Term Goal 3: Patient will increase strength in his right shoulder to 5/5 for increased independence pulling and pushing at work. Long Term Goal 3 Progress: Progressing toward goal Long Term Goal 4: Patient will decrease pain to 1/10 in his right shoulder while working. Long Term Goal 4 Progress: Met Long Term Goal 5: Patient will decrease fascial restrictions to trace in his right shoulder. Long Term Goal 5 Progress: Met End of Session Patient Active Problem List  Diagnoses  .  HYPERLIPIDEMIA  . CARPAL TUNNEL SYNDROME  .  CAD, ARTERY BYPASS GRAFT  . TRANSIENT ISCHEMIC ATTACKS, HX OF  . 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 Shoulder pain  . Rotator cuff syndrome  . Rotator cuff tear, right  . Arthritis, shoulder region  . Biceps tendinopathy  . Rotator cuff tendonitis  . S/P arthroscopy of shoulder  . Shoulder arthritis  . Nontraumatic rupture of tendons of biceps (long head)  . Synovitis of shoulder  . Peripheral vascular disease with claudication  . S/P shoulder surgery   End of Session Activity Tolerance: Patient tolerated treatment well General Behavior During Session: [facility] for tasks performed Cognition: [facility] for tasks performed   Shirlean Mylar, OTR/L  05/27/2011, 8:46 AM

## 2011-05-30 ENCOUNTER — Ambulatory Visit (HOSPITAL_COMMUNITY)
Admission: RE | Admit: 2011-05-30 | Discharge: 2011-05-30 | Disposition: A | Discharge status: Home / Self Care | Payer: BC Managed Care – PPO | Source: Ambulatory Visit | Attending: Family Medicine | Admitting: Family Medicine

## 2011-05-30 NOTE — Progress Notes (Signed)
Occupational Therapy Treatment  Patient Details  Name: DALLON DACOSTA MRN: [id number] Date of Birth: [birth year]  Today's Date: 05/30/2011 Time: 7829-5621 Time Calculation (min): 54 min Manual Therapy [phone] 16' Therapeutic Exercises [phone] 68' Visit#: 28  of 32   Re-eval: 06/06/11    Subjective Symptoms/Limitations Symptoms: S:  I go back to work next week. Pain Assessment Currently in Pain?: No/denies Pain Score: 0-No pain  O:  Exercise/Treatments Supine Protraction: PROM;10 reps Horizontal ABduction: PROM;10 reps External Rotation: PROM;10 reps Internal Rotation: PROM;10 reps Flexion: PROM;10 reps ABduction: PROM;10 reps Seated Protraction: Strengthening;15 reps Protraction Weight (lbs): 5 Horizontal ABduction: Strengthening;15 reps Horizontal ABduction Weight (lbs): 5 External Rotation: Strengthening;15 reps External Rotation Weight (lbs): 5# Internal Rotation: Strengthening;15 reps Internal Rotation Weight (lbs): 5 Flexion: Strengthening Flexion Weight (lbs): 5 Abduction: Strengthening;15 reps ABduction Weight (lbs): 5 Prone  Retraction: Strengthening;10 reps Retraction Weight (lbs): 4 Flexion: Strengthening;10 reps Flexion Weight (lbs): 4 Extension: Strengthening;10 reps Extension Weight (lbs): 4 External Rotation: Strengthening;10 reps External Rotation Weight (lbs): 4 Internal Rotation: Strengthening;10 reps Internal Rotation Weight (lbs): 4 Horizontal ABduction 1: Strengthening;10 reps Horizontal ABduction 1 Weight (lbs): 4 Horizontal ABduction 2: Strengthening;10 reps Horizontal ABduction 2 Weight (lbs): 4 ROM / Strengthening / Isometric Strengthening UBE (Upper Arm Bike): 3' and 3' 5.0 Cybex Press:  (4 plates x 15) Cybex Row:  (4 plates x 15) Wall Wash: 3' with 4# Wall Pushups: 20 reps "W" Arms: 15 with 5# X to V Arms: 15 with 5# Other ROM/Strengthening Exercises: Reaching overhead to thread machine with 5# weight on his wrist x 20  reps   Manual Therapy Manual Therapy: Myofascial release Myofascial Release: MFR and manual stretching to right shoulder region including upper arm, biceps, scapular region, and pectoral region to decrease pain and restrictions and increase pain free movement.  [phone]  Occupational Therapy Assessment and Plan OT Assessment and Plan Clinical Impression Statement: A:  Increased to 4# with prone strengthening. OT Plan: P:  Increase prone reps.   Goals Short Term Goals Short Term Goal 1: Patient will be I with HEP. Short Term Goal 2: Patient will increase PROM to [facility] in right shoulder for increased ability to comb his hair. Short Term Goal 3: Patient will increase right shoulder strength to 3+/5 for increased ability to put items into overhead cabinets. Short Term Goal 4: Patient will decrease pain to 3/10 in his right shoulder when working. Short Term Goal 5: Patient will decrease fascial restrictions from moderate to minimal in his right shoulder. Long Term Goals Long Term Goal 1: Patient will return to prior level of independence with all B/IADLs, work, lesiure activities. Long Term Goal 2: Patient will increase AROM to WNL for increased independence with reaching overhead and pulling and pushing at work. Long Term Goal 3: Patient will increase strength in his right shoulder to 5/5 for increased independence pulling and pushing at work. Long Term Goal 4: Patient will decrease pain to 1/10 in his right shoulder while working. Long Term Goal 5: Patient will decrease fascial restrictions to trace in his right shoulder. End of Session Patient Active Problem List  Diagnoses  . HYPERLIPIDEMIA  . CARPAL TUNNEL SYNDROME  . CAD, ARTERY BYPASS GRAFT  . TRANSIENT ISCHEMIC ATTACKS, HX OF  . 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 Shoulder pain  . Rotator cuff syndrome  . Rotator cuff tear, right  . Arthritis, shoulder region  . Biceps tendinopathy  . Rotator cuff  tendonitis  .  S/P arthroscopy of shoulder  . Shoulder arthritis  . Nontraumatic rupture of tendons of biceps (long head)  . Synovitis of shoulder  . Peripheral vascular disease with claudication  . S/P shoulder surgery   End of Session Activity Tolerance: Patient tolerated treatment well General Behavior During Session: [facility] for tasks performed Cognition: [facility] for tasks performed   Shirlean Mylar, OTR/L  05/30/2011, 9:15 AM

## 2011-06-02 ENCOUNTER — Ambulatory Visit (HOSPITAL_COMMUNITY)
Admission: RE | Admit: 2011-06-02 | Discharge: 2011-06-02 | Disposition: A | Discharge status: Home / Self Care | Payer: BC Managed Care – PPO | Source: Ambulatory Visit | Attending: Orthopedic Surgery | Admitting: Orthopedic Surgery

## 2011-06-02 DIAGNOSIS — M6281 Muscle weakness (generalized): Secondary | ICD-10-CM | POA: Insufficient documentation

## 2011-06-02 DIAGNOSIS — M25619 Stiffness of unspecified shoulder, not elsewhere classified: Secondary | ICD-10-CM | POA: Insufficient documentation

## 2011-06-02 DIAGNOSIS — IMO0001 Reserved for inherently not codable concepts without codable children: Secondary | ICD-10-CM | POA: Insufficient documentation

## 2011-06-02 DIAGNOSIS — M25519 Pain in unspecified shoulder: Secondary | ICD-10-CM | POA: Insufficient documentation

## 2011-06-02 NOTE — Progress Notes (Signed)
Occupational Therapy Treatment  Patient Details  Name: Devin [name] MRN: [id number] Date of Birth: 01-Oct-1945  Today's Date: 06/02/2011 Time: 9629-5284 Time Calculation (min): 55 min Manual Therapy [phone] 20' Therapeutic Exercise [phone] 35' Visit#: 28  of 32   Re-eval: 06/06/11   Subjective Symptoms/Limitations Symptoms: "I'm ready to go back to work and stop looking at these four walls" Pain Assessment Currently in Pain?: No/denies Pain Score: 0-No pain Pain Location: Shoulder Pain Orientation: Right   Exercise/Treatments Supine Protraction: PROM;10 reps Horizontal ABduction: PROM;10 reps External Rotation: PROM;10 reps Internal Rotation: PROM;10 reps Flexion: PROM;10 reps ABduction: PROM;10 reps Seated Protraction: Strengthening;15 reps Protraction Weight (lbs): 5 Horizontal ABduction: Strengthening;15 reps Horizontal ABduction Weight (lbs): 5 External Rotation: Strengthening;15 reps External Rotation Weight (lbs): 5# Internal Rotation: Strengthening;15 reps Internal Rotation Weight (lbs): 5 Flexion: Strengthening Flexion Weight (lbs): 5 Abduction: Strengthening;15 reps ABduction Weight (lbs): 5 Prone  Retraction: Strengthening;10 reps Retraction Weight (lbs): 4 Flexion: Strengthening;10 reps Flexion Weight (lbs): 4 Extension: Strengthening;10 reps Extension Weight (lbs): 4 External Rotation: Strengthening;10 reps External Rotation Weight (lbs): 4 Internal Rotation: Strengthening;10 reps Internal Rotation Weight (lbs): 4 Horizontal ABduction 1: Strengthening;10 reps Horizontal ABduction 1 Weight (lbs): 4 Horizontal ABduction 2: Strengthening;10 reps Horizontal ABduction 2 Weight (lbs): 4 ROM / Strengthening / Isometric Strengthening UBE (Upper Arm Bike): 3' and 3' 5.0 Cybex Press: 3 plate;25 reps Cybex Row: 3 plate;25 reps Wall Wash: 3' with 4# Wall Pushups: 20 reps "W" Arms: 15 with 5# X to V Arms: 15 with 5# Other ROM/Strengthening Exercises:  Reaching overhead to thread machine with 5# weight on his wrist x 20 reps      Manual Therapy Manual Therapy: Joint mobilization Joint Mobilization: PROM of Right  shoulder for increased mobility and decreased restrictions with massage to tight areas of the pectoral and scapular regions.  Occupational Therapy Assessment and Plan OT Assessment and Plan Rehab Potential: Excellent OT Frequency: Min 2X/week OT Duration: 8 weeks OT Treatment/Interventions: Therapeutic exercise;Patient/family education;Manual therapy OT Plan: P:  Increase prone reps.   Goals    Problem List Patient Active Problem List  Diagnoses  . HYPERLIPIDEMIA  . CARPAL TUNNEL SYNDROME  . CAD, ARTERY BYPASS GRAFT  . TRANSIENT ISCHEMIC ATTACKS, HX OF  . 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 Shoulder pain  . Rotator cuff syndrome  . Rotator cuff tear, right  . Arthritis, shoulder region  . Biceps tendinopathy  . Rotator cuff tendonitis  . S/P arthroscopy of shoulder  . Shoulder arthritis  . Nontraumatic rupture of tendons of biceps (long head)  . Synovitis of shoulder  . Peripheral vascular disease with claudication  . S/P shoulder surgery    End of Session Activity Tolerance: Patient tolerated treatment well General Behavior During Session: [facility] for tasks performed Cognition: [facility] for tasks performed   Lisa Roca OTR/L 06/02/2011, 9:34 AM

## 2011-06-06 ENCOUNTER — Ambulatory Visit (HOSPITAL_COMMUNITY)
Admission: RE | Admit: 2011-06-06 | Discharge: 2011-06-06 | Disposition: A | Discharge status: Home / Self Care | Payer: BC Managed Care – PPO | Source: Ambulatory Visit | Attending: Specialist | Admitting: Specialist

## 2011-06-06 NOTE — Progress Notes (Signed)
Occupational Therapy Treatment  Patient Details  Name: Devin [name] MRN: [id number] Date of Birth: [birth year]  Today's Date: 06/06/2011 Time: 4098-1191 Time Calculation (min): 56 min Visit#: 29  of 32   Re-eval: 06/06/11 Manual Therapy [phone] 16' Therapeutic Exercise [phone] 21' Reassessment [phone] 7'    Subjective Symptoms/Limitations Symptoms: S:  I am going to miss coming here but I have got to get back to work.   Exercise/Treatments Supine Protraction: PROM;10 reps Horizontal ABduction: PROM;10 reps External Rotation: PROM;10 reps Internal Rotation: PROM;10 reps Flexion: PROM;10 reps ABduction: PROM;10 reps Seated Protraction: Strengthening;15 reps Protraction Weight (lbs): 5 Horizontal ABduction: Strengthening;15 reps Horizontal ABduction Weight (lbs): 5 External Rotation: Strengthening;15 reps External Rotation Weight (lbs): 5# Internal Rotation: Strengthening;15 reps Internal Rotation Weight (lbs): 5 Flexion: Strengthening Flexion Weight (lbs): 5 Abduction: Strengthening;15 reps ABduction Weight (lbs): 5 Prone  Retraction: Strengthening;15 reps Retraction Weight (lbs): 4 Flexion: Strengthening;15 reps Flexion Weight (lbs): 4 Extension: Strengthening;15 reps Extension Weight (lbs): 4 External Rotation: Strengthening;15 reps External Rotation Weight (lbs): 4 Internal Rotation: Strengthening;15 reps Internal Rotation Weight (lbs): 4 Horizontal ABduction 1: Strengthening;15 reps Horizontal ABduction 1 Weight (lbs): 4 Horizontal ABduction 2: Strengthening;15 reps Horizontal ABduction 2 Weight (lbs): 4 ROM / Strengthening / Isometric Strengthening Cybex Press: 3 plate;25 reps Cybex Row: 3 plate;25 reps Wall Wash: 3' with 4# Wall Pushups: 20 reps;Other (comment) (from table top) "W" Arms: 15 with 5# X to V Arms: 15 with 5# Other ROM/Strengthening Exercises: Reaching overhead to thread machine with 5# weight on his wrist x 20 reps     Manual  Therapy Manual Therapy: Myofascial release Myofascial Release: MFR and manual stretching to right shoulder region including upper arm, biceps, scapular region and pectoral region to decrease pain and restrictions and increase pain free movement.  Occupational Therapy Assessment and Plan OT Assessment and Plan Clinical Impression Statement: A:  See progress note.  Switched wall pushup to tabletop pushup which patient tolerated well. Rehab Potential: Excellent OT Plan: P:  D/C to HEP.   Goals Short Term Goals Short Term Goal 1: Patient will be I with HEP. Short Term Goal 1 Progress: Met Short Term Goal 2: Patient will increase PROM to [facility] in right shoulder for increased ability to comb his hair. Short Term Goal 2 Progress: Met Short Term Goal 3: Patient will increase right shoulder strength to 3+/5 for increased ability to put items into overhead cabinets. Short Term Goal 3 Progress: Met Short Term Goal 4: Patient will decrease pain to 3/10 in his right shoulder when working. Short Term Goal 4 Progress: Met Short Term Goal 5: Patient will decrease fascial restrictions from moderate to minimal in his right shoulder. Short Term Goal 5 Progress: Met Long Term Goals Long Term Goal 1: Patient will return to prior level of independence with all B/IADLs, work, lesiure activities. Long Term Goal 1 Progress: Met Long Term Goal 2: Patient will increase AROM to WNL for increased independence with reaching overhead and pulling and pushing at work. Long Term Goal 2 Progress: Met Long Term Goal 3: Patient will increase strength in his right shoulder to 5/5 for increased independence pulling and pushing at work. Long Term Goal 3 Progress: Met Long Term Goal 4: Patient will decrease pain to 1/10 in his right shoulder while working. Long Term Goal 4 Progress: Met Long Term Goal 5: Patient will decrease fascial restrictions to trace in his right shoulder. Long Term Goal 5 Progress: Met  Problem  List Patient Active Problem List  Diagnoses  . HYPERLIPIDEMIA  . CARPAL TUNNEL SYNDROME  . CAD, ARTERY BYPASS GRAFT  . TRANSIENT ISCHEMIC ATTACKS, HX OF  . 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 Shoulder pain  . Rotator cuff syndrome  . Rotator cuff tear, right  . Arthritis, shoulder region  . Biceps tendinopathy  . Rotator cuff tendonitis  . S/P arthroscopy of shoulder  . Shoulder arthritis  . Nontraumatic rupture of tendons of biceps (long head)  . Synovitis of shoulder  . Peripheral vascular disease with claudication  . S/P shoulder surgery    End of Session Activity Tolerance: Patient tolerated treatment well General Behavior During Session: [facility] for tasks performed Cognition: [facility] for tasks performed    Jaslyn Bansal L. Soma Lizak, COTA/L  06/06/2011, 9:37 AM

## 2011-06-09 ENCOUNTER — Ambulatory Visit (HOSPITAL_COMMUNITY): Payer: BC Managed Care – PPO | Admitting: Specialist

## 2011-06-15 ENCOUNTER — Ambulatory Visit (INDEPENDENT_AMBULATORY_CARE_PROVIDER_SITE_OTHER): Payer: BC Managed Care – PPO | Admitting: Orthopedic Surgery

## 2011-06-15 ENCOUNTER — Encounter: Payer: Self-pay | Admitting: Orthopedic Surgery

## 2011-06-15 VITALS — BP 140/60 | Ht 64.0 in | Wt 130.0 lb

## 2011-06-15 DIAGNOSIS — Z9889 Other specified postprocedural states: Secondary | ICD-10-CM

## 2011-06-15 MED ORDER — HYDROCODONE-ACETAMINOPHEN 7.5-325 MG PO TABS: 1.0000 | ORAL_TABLET | Freq: Four times a day (QID) | ORAL | Status: DC | PRN

## 2011-06-15 NOTE — Progress Notes (Signed)
Patient ID: Devin [name], male   DOB: 1946-05-27, 67 y.o.   MRN: [id number]  Date of procedure 01/07/2011  Diagnosis SYNOVITIS, BICEPS DEGEN TEAR, AC JOINT OA, PARTIAL ROTATOR CUFF TEAR  Operative findings Synovitis glenohumeral joint, degenerative tear biceps tendon, a.c. Joint arthrosis, partial tear rotator cuff.  OT Assessment and Plan  Clinical Impression Statement: A: See progress note. Switched wall pushup to tabletop pushup which patient tolerated well.  Rehab Potential: Excellent  OT Plan: P: D/C to HEP.   C/o stiff shoulder, can I have some more pain medication   His passive range of motion is 140 his active range of motion is 110 he has a lot of scapular substitution.  Recommend continued physical therapy at home using a pulley.  Follow up as needed.

## 2011-06-15 NOTE — Patient Instructions (Addendum)
Use rope pulley to work on flexion   Take medication as needed

## 2011-08-25 ENCOUNTER — Other Ambulatory Visit: Payer: Self-pay | Admitting: Cardiology

## 2011-08-25 DIAGNOSIS — I6529 Occlusion and stenosis of unspecified carotid artery: Secondary | ICD-10-CM

## 2011-08-29 ENCOUNTER — Encounter (INDEPENDENT_AMBULATORY_CARE_PROVIDER_SITE_OTHER): Payer: BC Managed Care – PPO

## 2011-08-29 DIAGNOSIS — I6529 Occlusion and stenosis of unspecified carotid artery: Secondary | ICD-10-CM

## 2012-02-09 ENCOUNTER — Other Ambulatory Visit (HOSPITAL_COMMUNITY): Payer: Self-pay | Admitting: Family Medicine

## 2012-02-09 ENCOUNTER — Ambulatory Visit (HOSPITAL_COMMUNITY)
Admission: RE | Admit: 2012-02-09 | Discharge: 2012-02-09 | Disposition: A | Discharge status: Home / Self Care | Payer: Medicare Other | Source: Ambulatory Visit | Attending: Family Medicine | Admitting: Family Medicine

## 2012-02-09 DIAGNOSIS — R918 Other nonspecific abnormal finding of lung field: Secondary | ICD-10-CM | POA: Insufficient documentation

## 2012-02-09 DIAGNOSIS — Z951 Presence of aortocoronary bypass graft: Secondary | ICD-10-CM | POA: Insufficient documentation

## 2012-02-09 DIAGNOSIS — F17200 Nicotine dependence, unspecified, uncomplicated: Secondary | ICD-10-CM | POA: Insufficient documentation

## 2012-02-09 DIAGNOSIS — Z87891 Personal history of nicotine dependence: Secondary | ICD-10-CM

## 2012-02-23 ENCOUNTER — Encounter: Payer: Self-pay | Admitting: Cardiology

## 2012-02-23 ENCOUNTER — Ambulatory Visit (INDEPENDENT_AMBULATORY_CARE_PROVIDER_SITE_OTHER): Payer: Medicare Other | Admitting: Cardiology

## 2012-02-23 VITALS — BP 155/78 | HR 96 | Ht 64.0 in | Wt 154.8 lb

## 2012-02-23 DIAGNOSIS — E785 Hyperlipidemia, unspecified: Secondary | ICD-10-CM

## 2012-02-23 DIAGNOSIS — Z8679 Personal history of other diseases of the circulatory system: Secondary | ICD-10-CM

## 2012-02-23 DIAGNOSIS — I739 Peripheral vascular disease, unspecified: Secondary | ICD-10-CM

## 2012-02-23 DIAGNOSIS — I779 Disorder of arteries and arterioles, unspecified: Secondary | ICD-10-CM

## 2012-02-23 DIAGNOSIS — I25810 Atherosclerosis of coronary artery bypass graft(s) without angina pectoris: Secondary | ICD-10-CM

## 2012-02-23 NOTE — Assessment & Plan Note (Signed)
Quitting smoking and cutting down on his nicotine consumption was emphasized with him doing well with this. He is followed by Dr. Excell Seltzer as well. He has nonobstructive disease by Dopplers. Continue secondary preventative therapy.

## 2012-02-23 NOTE — Progress Notes (Signed)
HPI Devin [name] returns today for evaluation and management of his coronary artery disease and history of carotid disease.  Is having no angina. He is about 12 years out from coronary bypass grafting.  He denies any symptoms of TIAs or mini strokes.  He quit smoking but is using electronic cigarette. He is down to 3% nicotine per day. Started at  30% per day nicotine.  Past Medical History  Diagnosis Date  . Coronary atherosclerosis of artery bypass graft   . Personal history of unspecified circulatory disease   . 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 Chest pain, unspecified   . Other and unspecified hyperlipidemia   . TIA (transient ischemic attack)   . Carpal tunnel syndrome   . Asthma   . Shortness of breath   . Chronic kidney disease     hx of kidney stones  . Arthritis   . HOH (hard of hearing)     Current Outpatient Prescriptions  Medication Sig Dispense Refill  . aspirin (ASPIRIN LOW DOSE) 81 MG EC tablet Take 81 mg by mouth daily.        . clopidogrel (PLAVIX) 75 MG tablet Take 75 mg by mouth daily.       . dorzolamide-timolol (COSOPT) 22.3-6.8 MG/ML ophthalmic solution 1 drop as directed.        . latanoprost (XALATAN) 0.005 % ophthalmic solution Place 1 drop into both eyes daily.       . nitroGLYCERIN (NITROSTAT) 0.4 MG SL tablet Place 1 tablet (0.4 mg total) under the tongue every 5 (five) minutes as needed for chest pain.  25 tablet  8  . omeprazole (PRILOSEC OTC) 20 MG tablet Take 20 mg by mouth daily.        Marland Kitchen PROAIR HFA 108 (90 BASE) MCG/ACT inhaler Inhale 1 puff into the lungs every 4 (four) hours as needed. Shortness of Breath      . simvastatin (ZOCOR) 40 MG tablet Take 40 mg by mouth at bedtime.          Allergies  Allergen Reactions  . Aspirin Other (See Comments)    Has to take the coated aspirin    Family History  Problem Relation Age of Onset  . Heart disease    . Arthritis    . Asthma    . Coronary  artery disease    . Anesthesia problems Neg Hx   . Hypotension Neg Hx   . Malignant hyperthermia Neg Hx   . Pseudochol deficiency Neg Hx     History   Social History  . Marital Status: Married    Spouse Name: N/A    Number of Children: N/A  . Years of Education: GED   Occupational History  . fulltime     Social History Main Topics  . Smoking status: Current Every Day Smoker -- 1.0 packs/day for 50 years    Types: Cigars, Cigarettes  . Smokeless tobacco: Not on file  . Alcohol Use: No  . Drug Use: No  . Sexually Active: Yes   Other Topics Concern  . Not on file   Social History Narrative  . No narrative on file    ROS ALL NEGATIVE EXCEPT THOSE NOTED IN HPI  PE  General Appearance: well developed, well nourished in no acute distress HEENT: symmetrical face, PERRLA, good dentition  Neck: no JVD, thyromegaly, or adenopathy, trachea midline Chest: symmetric without deformity Cardiac: PMI non-displaced, RRR, normal S1, S2, no  gallop or murmur Lung: clear to ausculation and percussion Vascular: all pulses full without bruits  Abdominal: nondistended, nontender, good bowel sounds, no HSM, no bruits Extremities: no cyanosis, clubbing or edema, no sign of DVT, no varicosities  Skin: normal color, no rashes Neuro: alert and oriented x 3, non-focal Pysch: normal affect  EKG  BMET    Component Value Date/Time   NA 139 01/03/2011 1400   K 3.8 01/03/2011 1400   CL 104 01/03/2011 1400   CO2 21 01/03/2011 1400   GLUCOSE 115* 01/03/2011 1400   BUN 10 01/03/2011 1400   CREATININE 0.76 01/03/2011 1400   CALCIUM 9.0 01/03/2011 1400   GFRNONAA >60 01/03/2011 1400   GFRAA >60 01/03/2011 1400    Lipid Panel  No results found for this basename: chol, trig, hdl, cholhdl, vldl, ldlcalc    CBC    Component Value Date/Time   WBC 9.1 01/03/2011 1400   RBC 4.14* 01/03/2011 1400   HGB 14.0 01/03/2011 1400   HCT 40.3 01/03/2011 1400   PLT 151 01/03/2011 1400   MCV 97.3 01/03/2011 1400   MCH 33.8  01/03/2011 1400   MCHC 34.7 01/03/2011 1400   RDW 13.7 01/03/2011 1400

## 2012-02-23 NOTE — Assessment & Plan Note (Signed)
Repeat carotid Dopplers in April of 2014

## 2012-02-23 NOTE — Patient Instructions (Addendum)
Your physician has requested that you have a carotid duplex in 2015. This test is an ultrasound of the carotid arteries in your neck. It looks at blood flow through these arteries that supply the brain with blood. Allow one hour for this exam. There are no restrictions or special instructions.  Your physician wants you to follow-up in: 1 year with Dr. Daleen Squibb. You will receive a reminder letter in the mail two months in advance. If you don't receive a letter, please call our office to schedule the follow-up appointment.  Your physician recommends that you continue on your current medications as directed. Please refer to the Current Medication list given to you today.

## 2012-02-23 NOTE — Assessment & Plan Note (Signed)
Stable. Continue secondary preventative therapy. 

## 2012-02-23 NOTE — Assessment & Plan Note (Signed)
No recurrence. Continue secondary preventative therapy.

## 2012-03-22 ENCOUNTER — Ambulatory Visit: Payer: BC Managed Care – PPO | Admitting: Cardiovascular Disease

## 2012-04-19 ENCOUNTER — Ambulatory Visit (INDEPENDENT_AMBULATORY_CARE_PROVIDER_SITE_OTHER): Payer: Medicare Other | Admitting: Cardiovascular Disease

## 2012-04-19 ENCOUNTER — Encounter: Payer: Self-pay | Admitting: Cardiovascular Disease

## 2012-04-19 VITALS — BP 168/90 | HR 55 | Ht 64.0 in | Wt 160.0 lb

## 2012-04-19 DIAGNOSIS — I25810 Atherosclerosis of coronary artery bypass graft(s) without angina pectoris: Secondary | ICD-10-CM

## 2012-04-19 DIAGNOSIS — I70219 Atherosclerosis of native arteries of extremities with intermittent claudication, unspecified extremity: Secondary | ICD-10-CM

## 2012-04-19 NOTE — Progress Notes (Signed)
   HPI:  66 year old gentleman presenting for followup of peripheral arterial disease. The patient was seen last year for bilateral calf claudication. ABIs one year ago were 0.92 on the right and 0.76 on the left with duplex scanning demonstrating severe mid left SFA stenosis.  He is currently limited by right knee pain. This is been bothering him for several months now. He is still able to walk about a mile. He denies any calf pain at present. He has not had any nonhealing wounds or ulcers. He denies chest pain or dyspnea.  Outpatient Encounter Prescriptions as of 04/19/2012  Medication Sig Dispense Refill  . aspirin (ASPIRIN LOW DOSE) 81 MG EC tablet Take 81 mg by mouth daily.        . clopidogrel (PLAVIX) 75 MG tablet Take 75 mg by mouth daily.       . dorzolamide-timolol (COSOPT) 22.3-6.8 MG/ML ophthalmic solution 1 drop as directed.        . latanoprost (XALATAN) 0.005 % ophthalmic solution Place 1 drop into both eyes daily.       . nitroGLYCERIN (NITROSTAT) 0.4 MG SL tablet Place 1 tablet (0.4 mg total) under the tongue every 5 (five) minutes as needed for chest pain.  25 tablet  8  . omeprazole (PRILOSEC OTC) 20 MG tablet Take 20 mg by mouth daily.        . simvastatin (ZOCOR) 40 MG tablet Take 40 mg by mouth at bedtime.        . [DISCONTINUED] PROAIR HFA 108 (90 BASE) MCG/ACT inhaler Inhale 1 puff into the lungs every 4 (four) hours as needed. Shortness of Breath        Allergies  Allergen Reactions  . Aspirin Other (See Comments)    Has to take the coated aspirin    Past Medical History  Diagnosis Date  . Coronary atherosclerosis of artery bypass graft   . Personal history of unspecified circulatory disease   . 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 Chest pain, unspecified   . Other and unspecified hyperlipidemia   . TIA (transient ischemic attack)   . Carpal tunnel syndrome   . Asthma   . Shortness of breath   . Chronic kidney  disease     hx of kidney stones  . Arthritis   . HOH (hard of hearing)     ROS: Negative except as per HPI  BP 168/90  Pulse 55  Ht 5\' 4"  (1.626 m)  Wt 72.576 kg (160 lb)  BMI 27.46 kg/m2  PHYSICAL EXAM: Pt is alert and oriented, NAD HEENT: normal Neck: JVP - normal, carotids 2+= without bruits Lungs: CTA bilaterally CV: RRR without murmur or gallop Abd: soft, NT, Positive BS, no hepatomegaly Ext: no C/C/E, pedal pulses nonpalpable. The feet are warm. Skin: warm/dry no rash  EKG:  Sinus bradycardia 56 beats per minute, nonspecific ST abnormality, otherwise within normal limits.  ASSESSMENT AND PLAN: 1. Lower extremity peripheral arterial disease with intermittent claudication. The patient's claudication symptoms have resolved since last year. He does have some limitation related to right knee pain which is clearly an orthopedic problem. I would like to see him back in 12 months with ABIs before the office visit.  2. Tobacco abuse. The patient has quit smoking with the help of electronic cigarettes.  Tonny Bollman 04/19/2012 9:35 AM

## 2012-04-19 NOTE — Patient Instructions (Addendum)
Your physician wants you to follow-up in: 1 YEAR with Dr Cooper.  You will receive a reminder letter in the mail two months in advance. If you don't receive a letter, please call our office to schedule the follow-up appointment.  Your physician has requested that you have an ankle brachial index (ABI) in 1 YEAR. During this test an ultrasound and blood pressure cuff are used to evaluate the arteries that supply the arms and legs with blood. Allow thirty minutes for this exam. There are no restrictions or special instructions.  Your physician recommends that you continue on your current medications as directed. Please refer to the Current Medication list given to you today.  

## 2012-07-06 ENCOUNTER — Other Ambulatory Visit (HOSPITAL_COMMUNITY): Payer: Self-pay | Admitting: Family Medicine

## 2012-07-06 ENCOUNTER — Ambulatory Visit (HOSPITAL_COMMUNITY)
Admission: RE | Admit: 2012-07-06 | Discharge: 2012-07-06 | Disposition: A | Discharge status: Home / Self Care | Payer: Medicare Other | Source: Ambulatory Visit | Attending: Family Medicine | Admitting: Family Medicine

## 2012-07-06 DIAGNOSIS — M47817 Spondylosis without myelopathy or radiculopathy, lumbosacral region: Secondary | ICD-10-CM | POA: Insufficient documentation

## 2012-07-06 DIAGNOSIS — M545 Low back pain, unspecified: Secondary | ICD-10-CM | POA: Insufficient documentation

## 2012-07-09 ENCOUNTER — Other Ambulatory Visit (HOSPITAL_COMMUNITY): Payer: Self-pay | Admitting: Family Medicine

## 2012-07-09 DIAGNOSIS — M545 Low back pain: Secondary | ICD-10-CM

## 2012-07-13 ENCOUNTER — Ambulatory Visit (HOSPITAL_COMMUNITY)
Admission: RE | Admit: 2012-07-13 | Discharge: 2012-07-13 | Disposition: A | Discharge status: Home / Self Care | Payer: Medicare Other | Source: Ambulatory Visit | Attending: Family Medicine | Admitting: Family Medicine

## 2012-07-13 DIAGNOSIS — M5126 Other intervertebral disc displacement, lumbar region: Secondary | ICD-10-CM | POA: Insufficient documentation

## 2012-07-13 DIAGNOSIS — M79609 Pain in unspecified limb: Secondary | ICD-10-CM | POA: Insufficient documentation

## 2012-07-13 DIAGNOSIS — M545 Low back pain, unspecified: Secondary | ICD-10-CM | POA: Insufficient documentation

## 2013-04-03 ENCOUNTER — Ambulatory Visit (INDEPENDENT_AMBULATORY_CARE_PROVIDER_SITE_OTHER): Payer: Medicare Other | Admitting: Cardiovascular Disease

## 2013-04-03 ENCOUNTER — Ambulatory Visit (HOSPITAL_COMMUNITY): Payer: Medicare Other | Attending: Cardiovascular Disease | Admitting: Cardiology

## 2013-04-03 ENCOUNTER — Encounter: Payer: Self-pay | Admitting: Cardiovascular Disease

## 2013-04-03 VITALS — BP 134/80 | HR 54 | Ht 64.0 in | Wt 131.0 lb

## 2013-04-03 DIAGNOSIS — I25810 Atherosclerosis of coronary artery bypass graft(s) without angina pectoris: Secondary | ICD-10-CM

## 2013-04-03 DIAGNOSIS — E785 Hyperlipidemia, unspecified: Secondary | ICD-10-CM | POA: Insufficient documentation

## 2013-04-03 DIAGNOSIS — I739 Peripheral vascular disease, unspecified: Secondary | ICD-10-CM | POA: Insufficient documentation

## 2013-04-03 DIAGNOSIS — I70209 Unspecified atherosclerosis of native arteries of extremities, unspecified extremity: Secondary | ICD-10-CM | POA: Insufficient documentation

## 2013-04-03 DIAGNOSIS — I70219 Atherosclerosis of native arteries of extremities with intermittent claudication, unspecified extremity: Secondary | ICD-10-CM

## 2013-04-03 DIAGNOSIS — I2510 Atherosclerotic heart disease of native coronary artery without angina pectoris: Secondary | ICD-10-CM | POA: Insufficient documentation

## 2013-04-03 DIAGNOSIS — Z87891 Personal history of nicotine dependence: Secondary | ICD-10-CM | POA: Insufficient documentation

## 2013-04-03 DIAGNOSIS — Z951 Presence of aortocoronary bypass graft: Secondary | ICD-10-CM | POA: Insufficient documentation

## 2013-04-03 DIAGNOSIS — Z8673 Personal history of transient ischemic attack (TIA), and cerebral infarction without residual deficits: Secondary | ICD-10-CM | POA: Insufficient documentation

## 2013-04-03 MED ORDER — NITROGLYCERIN 0.4 MG SL SUBL: 0.4000 mg | SUBLINGUAL_TABLET | SUBLINGUAL | Status: DC | PRN

## 2013-04-03 NOTE — Patient Instructions (Addendum)
You will have an ABI performed today.  Your physician wants you to follow-up in: 1 YEAR with Dr Excell Seltzer.  You will receive a reminder letter in the mail two months in advance. If you don't receive a letter, please call our office to schedule the follow-up appointment.  Your physician recommends that you continue on your current medications as directed. Please refer to the Current Medication list given to you today.

## 2013-04-03 NOTE — Progress Notes (Signed)
    HPI:  67 year old gentleman presenting for followup evaluation. The patient has peripheral arterial disease with stenosis of the SFA. He also has coronary artery disease with history of remote CABG. His last carotid duplex in April 2013 showed nonobstructive carotid plaque bilaterally. 2 year followup is recommended. His last ABIs from 2012 showed normal values on the right and an ABI of 0.76 on the left.  The patient has intermittent calf claudication and can walk about 1/4 mile until calf pain makes him stop. He states both legs are affected equally. There is no rest pain or ischemic ulceration. The patient denies chest pain, dyspnea, or edema. He has no other complaints today.  Outpatient Encounter Prescriptions as of 04/03/2013  Medication Sig  . aspirin (ASPIRIN LOW DOSE) 81 MG EC tablet Take 81 mg by mouth daily.    . clopidogrel (PLAVIX) 75 MG tablet Take 75 mg by mouth daily.   . dorzolamide-timolol (COSOPT) 22.3-6.8 MG/ML ophthalmic solution 1 drop as directed.    . latanoprost (XALATAN) 0.005 % ophthalmic solution Place 1 drop into both eyes daily.   . nitroGLYCERIN (NITROSTAT) 0.4 MG SL tablet Place 1 tablet (0.4 mg total) under the tongue every 5 (five) minutes as needed for chest pain.  Marland Kitchen omeprazole (PRILOSEC OTC) 20 MG tablet Take 20 mg by mouth daily.    . simvastatin (ZOCOR) 40 MG tablet Take 40 mg by mouth at bedtime.      Allergies  Allergen Reactions  . Aspirin Other (See Comments)    Has to take the coated aspirin    Past Medical History  Diagnosis Date  . Coronary atherosclerosis of artery bypass graft   . Personal history of unspecified circulatory disease   . 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 Chest pain, unspecified   . Other and unspecified hyperlipidemia   . TIA (transient ischemic attack)   . Carpal tunnel syndrome   . Asthma   . Shortness of breath   . Chronic kidney disease     hx of kidney stones  .  Arthritis   . HOH (hard of hearing)     ROS: Negative except as per HPI  BP 134/80  Pulse 54  Ht 5\' 4"  (1.626 m)  Wt 131 lb (59.421 kg)  BMI 22.47 kg/m2  SpO2 94%  PHYSICAL EXAM: Pt is alert and oriented, NAD HEENT: normal Neck: JVP - normal, carotids 2+= without bruits Lungs: CTA bilaterally CV: RRR without murmur or gallop Abd: soft, NT, Positive BS, no hepatomegaly Ext: no C/C/E, pedal pulses nonpalpable bilaterally Skin: warm/dry no rash  EKG:  Sinus bradycardia 54 bpm, otherwise within normal limits  ASSESSMENT AND PLAN: 1. CAD s/p CABG - stable without anginal symptoms. Continue current Rx's (ASA, plavix, simvastatin) 2. Lower extremity PAD with intermittent claudication. Check ABI's and duplex studies. No signs of progressive leg ischemia, but symptomatic with exertion. 3. Tobacco - still using e-cigs. Complete cessation advised.  4. Hyperlipidemia - on a statin drug with lipids followed by PCP.   Devin [name] 04/05/2013 11:03 PM

## 2013-05-01 ENCOUNTER — Ambulatory Visit: Payer: Medicare Other | Admitting: Cardiovascular Disease

## 2013-05-01 ENCOUNTER — Encounter (HOSPITAL_COMMUNITY): Payer: Medicare Other

## 2013-05-28 ENCOUNTER — Encounter: Payer: Self-pay | Admitting: Cardiovascular Disease

## 2013-06-24 NOTE — Progress Notes (Signed)
Patient ID: Devin [name], male   DOB: [birth year], 68 y.o.   MRN: [id number] None

## 2014-04-03 ENCOUNTER — Encounter: Payer: Self-pay | Admitting: Cardiovascular Disease

## 2014-04-03 ENCOUNTER — Ambulatory Visit (INDEPENDENT_AMBULATORY_CARE_PROVIDER_SITE_OTHER): Payer: Medicare Other | Admitting: Cardiovascular Disease

## 2014-04-03 VITALS — BP 118/68 | HR 67 | Ht 64.0 in | Wt 141.0 lb

## 2014-04-03 DIAGNOSIS — E785 Hyperlipidemia, unspecified: Secondary | ICD-10-CM

## 2014-04-03 DIAGNOSIS — I25810 Atherosclerosis of coronary artery bypass graft(s) without angina pectoris: Secondary | ICD-10-CM

## 2014-04-03 NOTE — Progress Notes (Signed)
Background: The patient has peripheral arterial disease with stenosis of the SFA. He also has coronary artery disease with history of remote CABG.  HPI:  68 year old gentleman presenting for follow-up evaluation.the patient is doing well from a cardiac perspective. He has retired from work. He had his wife both feel this has resulted in marked improvement in his overall quality of life. He denies any chest pain, chest pressure, or shortness of breath. He has no pain in his legs at all with walking. He specifically denies calf pain. He denies ulceration or wounds on the feet.  Studies:  Arterial Doppler in 2014 demonstrated severely elevated velocities at the origin of the right SFA. The ABI was 0.59 on the right. On the left the ABI was 0.74.  Cardiac catheterization in 2007 demonstrated patency of all 5 bypass grafts.  Outpatient Encounter Prescriptions as of 04/03/2014  Medication Sig  . aspirin (ASPIRIN LOW DOSE) 81 MG EC tablet Take 81 mg by mouth daily.    . clopidogrel (PLAVIX) 75 MG tablet Take 75 mg by mouth daily.   . dorzolamide-timolol (COSOPT) 22.3-6.8 MG/ML ophthalmic solution 1 drop as directed.    . latanoprost (XALATAN) 0.005 % ophthalmic solution Place 1 drop into both eyes daily.   . nitroGLYCERIN (NITROSTAT) 0.4 MG SL tablet Place 1 tablet (0.4 mg total) under the tongue every 5 (five) minutes as needed for chest pain.  Marland Kitchen omeprazole (PRILOSEC OTC) 20 MG tablet Take 20 mg by mouth daily.    . simvastatin (ZOCOR) 40 MG tablet Take 40 mg by mouth at bedtime.      Allergies  Allergen Reactions  . Aspirin Other (See Comments)    Has to take the coated aspirin    Past Medical History  Diagnosis Date  . Coronary atherosclerosis of artery bypass graft   . Personal history of unspecified circulatory disease   . 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 Chest pain, unspecified   . Other and unspecified hyperlipidemia   . TIA  (transient ischemic attack)   . Carpal tunnel syndrome   . Asthma   . Shortness of breath   . Chronic kidney disease     hx of kidney stones  . Arthritis   . HOH (hard of hearing)     family history includes Arthritis in an other family member; Asthma in an other family member; Coronary artery disease in an other family member; Heart disease in an other family member. There is no history of Anesthesia problems, Hypotension, Malignant hyperthermia, or Pseudochol deficiency.   ROS: Negative except as per HPI  BP 118/68 mmHg  Pulse 67  Ht 5\' 4"  (1.626 m)  Wt 141 lb (63.957 kg)  BMI 24.19 kg/m2  PHYSICAL EXAM: Pt is alert and oriented, NAD HEENT: normal Neck: JVP - normal, carotids 2+= without bruits Lungs: CTA bilaterally CV: RRR without murmur or gallop Abd: soft, NT, Positive BS, no hepatomegaly Ext: no C/C/E, femoral pulses 1+ and equal bilaterally, pedal pulses are nonpalpable. Skin: warm/dry no rash  EKG:  Normal sinus rhythm 67 bpm, nonspecific ST abnormality.  ASSESSMENT AND PLAN: 1. Coronary artery disease, native vessel. The patient has had remote multivessel CABG with wide patency of all bypass grafts by cardiac catheterizationin 2007.he has no symptoms of angina. He will continue on his current medical program.  2. Lower extremity peripheral arterial disease. His claudication symptoms have resolved. He will continue on his current medical treatment. He does  have significant right SFA disease at the ostium based on anatomic assessment with ultrasound. Considering his complete lack of symptoms, will manage him conservatively and see him back in one year.  3. Hyperlipidemia. Followed by his primary physician. He continues on simvastatin.  I will see him back in one year. Considering his low bleeding risk and significant coronary and peripheral arterial disease, I would favor keeping him on dual antiplatelet therapy with aspirin and Plavix.  Sherren Mocha,  MD 04/03/2014 2:25 PM

## 2014-04-03 NOTE — Patient Instructions (Signed)
Your physician wants you to follow-up in: 1 YEAR with Dr Cooper.  You will receive a reminder letter in the mail two months in advance. If you don't receive a letter, please call our office to schedule the follow-up appointment.  Your physician recommends that you continue on your current medications as directed. Please refer to the Current Medication list given to you today.  

## 2014-05-29 ENCOUNTER — Other Ambulatory Visit (HOSPITAL_COMMUNITY): Payer: Self-pay | Admitting: Family Medicine

## 2014-05-29 ENCOUNTER — Ambulatory Visit (HOSPITAL_COMMUNITY)
Admission: RE | Admit: 2014-05-29 | Discharge: 2014-05-29 | Disposition: A | Discharge status: Home / Self Care | Payer: Medicare Other | Source: Ambulatory Visit | Attending: Family Medicine | Admitting: Family Medicine

## 2014-05-29 DIAGNOSIS — J439 Emphysema, unspecified: Secondary | ICD-10-CM | POA: Diagnosis not present

## 2014-05-29 DIAGNOSIS — J449 Chronic obstructive pulmonary disease, unspecified: Secondary | ICD-10-CM

## 2014-05-29 DIAGNOSIS — R059 Cough, unspecified: Secondary | ICD-10-CM

## 2014-05-29 DIAGNOSIS — R05 Cough: Secondary | ICD-10-CM

## 2014-05-29 DIAGNOSIS — R9389 Abnormal findings on diagnostic imaging of other specified body structures: Secondary | ICD-10-CM

## 2014-05-29 DIAGNOSIS — R062 Wheezing: Secondary | ICD-10-CM | POA: Diagnosis present

## 2014-05-29 DIAGNOSIS — R0600 Dyspnea, unspecified: Secondary | ICD-10-CM

## 2014-05-29 DIAGNOSIS — R509 Fever, unspecified: Secondary | ICD-10-CM

## 2015-04-29 ENCOUNTER — Ambulatory Visit: Payer: Medicare Other | Admitting: Cardiovascular Disease

## 2015-06-11 ENCOUNTER — Encounter: Payer: Self-pay | Admitting: Cardiovascular Disease

## 2015-06-11 ENCOUNTER — Ambulatory Visit (INDEPENDENT_AMBULATORY_CARE_PROVIDER_SITE_OTHER): Payer: Medicare Other | Admitting: Cardiovascular Disease

## 2015-06-11 VITALS — BP 140/80 | HR 72 | Ht 64.0 in | Wt 150.4 lb

## 2015-06-11 DIAGNOSIS — I25810 Atherosclerosis of coronary artery bypass graft(s) without angina pectoris: Secondary | ICD-10-CM | POA: Diagnosis not present

## 2015-06-11 DIAGNOSIS — I739 Peripheral vascular disease, unspecified: Secondary | ICD-10-CM

## 2015-06-11 MED ORDER — CLOPIDOGREL BISULFATE 75 MG PO TABS: 75.0000 mg | ORAL_TABLET | Freq: Every day | ORAL | Status: DC

## 2015-06-11 NOTE — Patient Instructions (Signed)
Medication Instructions:  Your physician recommends that you continue on your current medications as directed. Please refer to the Current Medication list given to you today.  Labwork: No new orders.   Testing/Procedures: Your physician has requested that you have a lower extremity arterial duplex. This test is an ultrasound of the arteries in the legs or arms. It looks at arterial blood flow in the legs and arms. Allow one hour for Lower and Upper Arterial scans. There are no restrictions or special instructions  Your physician has requested that you have an ankle brachial index (ABI). During this test an ultrasound and blood pressure cuff are used to evaluate the arteries that supply the arms and legs with blood. Allow thirty minutes for this exam. There are no restrictions or special instructions.  Follow-Up: Your physician wants you to follow-up in: 1 YEAR with Dr Burt Knack.  You will receive a reminder letter in the mail two months in advance. If you don't receive a letter, please call our office to schedule the follow-up appointment.   Any Other Special Instructions Will Be Listed Below (If Applicable).     If you need a refill on your cardiac medications before your next appointment, please call your pharmacy.

## 2015-06-11 NOTE — Progress Notes (Signed)
Cardiology Office Note Date:  06/11/2015   ID:  TANO MARCUM, DOB Mar 10, 1946, MRN [id number]  PCP:  [name], MD  Cardiologist:  Sherren Mocha, MD    Chief Complaint  Patient presents with  . Claudication    History of Present Illness: Devin [name] is a 70 y.o. male who presents for follow-up of coronary and peripheral arterial disease. The patient underwent remote CABG. His last heart catheterization in 2007 demonstrated continued patency of all of his bypass grafts. He also has known stenosis of the right SFA with an ABI of 0.59. The left ABI when last checked was 0.74.  The patient complains of bilateral foot burning with walking. He also has some burning pain in his lower legs. Symptoms resolve with rest after 3-4 minutes. This occurs with walking short distances such as from his home to his mailbox. Symptoms are a little worse than they were last year. He has no resting pain or history of ulceration. He denies chest pain or shortness of breath. He had recent lab work checked by his primary care physician. He denies leg swelling, heart palpitations, orthopnea, or PND.  Past Medical History  Diagnosis Date  . Coronary atherosclerosis of artery bypass graft   . Personal history of unspecified circulatory disease   . 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 Chest pain, unspecified   . Other and unspecified hyperlipidemia   . TIA (transient ischemic attack)   . Carpal tunnel syndrome   . Asthma   . Shortness of breath   . Chronic kidney disease     hx of kidney stones  . Arthritis   . HOH (hard of hearing)     Past Surgical History  Procedure Laterality Date  . Neck surgery    . Carpal tunnel release      blateral carpal tunnel  . Coronary artery bypass graft      x5 12 yrs ago  . Back surgery      cervical disc    Current Outpatient Prescriptions  Medication Sig Dispense Refill  . aspirin (ASPIRIN LOW DOSE) 81  MG EC tablet Take 81 mg by mouth daily.      . clopidogrel (PLAVIX) 75 MG tablet Take 1 tablet (75 mg total) by mouth daily. 90 tablet 3  . dorzolamide-timolol (COSOPT) 22.3-6.8 MG/ML ophthalmic solution 1 drop as directed.      . latanoprost (XALATAN) 0.005 % ophthalmic solution Place 1 drop into both eyes daily.     . nitroGLYCERIN (NITROSTAT) 0.4 MG SL tablet Place 1 tablet (0.4 mg total) under the tongue every 5 (five) minutes as needed for chest pain. 25 tablet 3  . omeprazole (PRILOSEC OTC) 20 MG tablet Take 20 mg by mouth daily.      . simvastatin (ZOCOR) 40 MG tablet Take 40 mg by mouth at bedtime.       No current facility-administered medications for this visit.    Allergies:   Aspirin   Social History:  The patient  reports that he has been smoking Cigarettes and Cigars.  He has a 50 pack-year smoking history. He does not have any smokeless tobacco history on file. He reports that he does not drink alcohol or use illicit drugs.   Family History:  The patient's  family history is negative for Anesthesia problems, Hypotension, Malignant hyperthermia, and Pseudochol deficiency.    ROS:  Please see the history of present illness.  Otherwise, review  of systems is positive for snoring, wheezing.  All other systems are reviewed and negative.    PHYSICAL EXAM: VS:  BP 140/80 mmHg  Pulse 72  Ht 5\' 4"  (1.626 m)  Wt 150 lb 6.4 oz (68.221 kg)  BMI 25.80 kg/m2 , BMI Body mass index is 25.8 kg/(m^2). GEN: Well nourished, well developed, in no acute distress HEENT: normal Neck: no JVD, no masses. No carotid bruits Cardiac: RRR without murmur or gallop                Respiratory:  clear to auscultation bilaterally, prlonged expirationGI: soft, nontender, nondistended, + BS MS: no deformity or atrophy Ext: no pretibial edema, pedal pulses nonpalpable Skin: warm and dry, no rash Neuro:  Strength and sensation are intact Psych: euthymic mood, full affect  EKG:  EKG is ordered  today. The ekg ordered today shows normal sinus rhythm 73 bpm, nonspecific ST and T-wave abnormality.  Recent Labs: No results found for requested labs within last 365 days.   Lipid Panel  No results found for: CHOL, TRIG, HDL, CHOLHDL, VLDL, LDLCALC, LDLDIRECT    Wt Readings from Last 3 Encounters:  06/11/15 150 lb 6.4 oz (68.221 kg)  04/03/14 141 lb (63.957 kg)  04/03/13 131 lb (59.421 kg)    ASSESSMENT AND PLAN: 1.  CAD status post CABG: The patient is stable and has no symptoms of angina. He will remain on dual antiplatelet therapy, lipid-lowering, and lifestyle modification was reviewed with him today.  2. Leg claudication with known peripheral arterial disease. Recommended updated ABIs and Doppler studies. Continues on aspirin, Plavix, and a statin drug. No signs of resting ischemia or critical limb complications.  3. Hyperlipidemia: Treated with simvastatin. Lipids have been followed by his PCP.  4. Tobacco abuse: Complete cessation advised. Counseling done.  Current medicines are reviewed with the patient today.  The patient does not have concerns regarding medicines.  Labs/ tests ordered today include:   Orders Placed This Encounter  Procedures  . EKG 12-Lead    Disposition:   FU one year  Signed, Sherren Mocha, MD  06/11/2015 5:12 PM    [facility] [address], [place], [place]  [address] Phone: [phone]; Fax: [phone]

## 2015-06-24 ENCOUNTER — Ambulatory Visit (HOSPITAL_COMMUNITY)
Admission: RE | Admit: 2015-06-24 | Discharge: 2015-06-24 | Disposition: A | Discharge status: Home / Self Care | Payer: Medicare Other | Source: Ambulatory Visit | Attending: Cardiology | Admitting: Cardiology

## 2015-06-24 DIAGNOSIS — I25810 Atherosclerosis of coronary artery bypass graft(s) without angina pectoris: Secondary | ICD-10-CM

## 2015-06-24 DIAGNOSIS — I739 Peripheral vascular disease, unspecified: Secondary | ICD-10-CM | POA: Diagnosis present

## 2015-06-24 DIAGNOSIS — E785 Hyperlipidemia, unspecified: Secondary | ICD-10-CM | POA: Insufficient documentation

## 2015-06-24 DIAGNOSIS — Z8673 Personal history of transient ischemic attack (TIA), and cerebral infarction without residual deficits: Secondary | ICD-10-CM | POA: Diagnosis not present

## 2015-07-06 ENCOUNTER — Other Ambulatory Visit: Payer: Self-pay | Admitting: Cardiovascular Disease

## 2015-07-06 DIAGNOSIS — I739 Peripheral vascular disease, unspecified: Secondary | ICD-10-CM

## 2015-07-14 ENCOUNTER — Ambulatory Visit (HOSPITAL_COMMUNITY)
Admission: RE | Admit: 2015-07-14 | Discharge: 2015-07-14 | Disposition: A | Discharge status: Home / Self Care | Payer: Medicare Other | Source: Ambulatory Visit | Attending: Cardiovascular Disease | Admitting: Cardiovascular Disease

## 2015-07-14 DIAGNOSIS — Z951 Presence of aortocoronary bypass graft: Secondary | ICD-10-CM | POA: Diagnosis not present

## 2015-07-14 DIAGNOSIS — I739 Peripheral vascular disease, unspecified: Secondary | ICD-10-CM

## 2015-07-14 DIAGNOSIS — I70203 Unspecified atherosclerosis of native arteries of extremities, bilateral legs: Secondary | ICD-10-CM | POA: Diagnosis not present

## 2015-07-14 DIAGNOSIS — E785 Hyperlipidemia, unspecified: Secondary | ICD-10-CM | POA: Insufficient documentation

## 2015-07-14 DIAGNOSIS — I2510 Atherosclerotic heart disease of native coronary artery without angina pectoris: Secondary | ICD-10-CM | POA: Diagnosis not present

## 2015-07-14 DIAGNOSIS — Z8673 Personal history of transient ischemic attack (TIA), and cerebral infarction without residual deficits: Secondary | ICD-10-CM | POA: Diagnosis not present

## 2015-07-21 ENCOUNTER — Ambulatory Visit: Payer: Medicare Other | Admitting: Cardiovascular Disease

## 2015-07-28 ENCOUNTER — Ambulatory Visit (INDEPENDENT_AMBULATORY_CARE_PROVIDER_SITE_OTHER): Payer: Medicare Other | Admitting: Cardiovascular Disease

## 2015-07-28 ENCOUNTER — Encounter: Payer: Self-pay | Admitting: Cardiovascular Disease

## 2015-07-28 VITALS — BP 140/70 | HR 60 | Ht 64.0 in | Wt 151.0 lb

## 2015-07-28 DIAGNOSIS — I739 Peripheral vascular disease, unspecified: Secondary | ICD-10-CM

## 2015-07-28 DIAGNOSIS — I25810 Atherosclerosis of coronary artery bypass graft(s) without angina pectoris: Secondary | ICD-10-CM | POA: Diagnosis not present

## 2015-07-28 LAB — CBC
HEMATOCRIT: 39 % (ref 39.0–52.0)
HEMOGLOBIN: 13.2 g/dL (ref 13.0–17.0)
MCH: 28.7 pg (ref 26.0–34.0)
MCHC: 33.8 g/dL (ref 30.0–36.0)
MCV: 84.8 fL (ref 78.0–100.0)
MPV: 10.1 fL (ref 8.6–12.4)
PLATELETS: 252 10*3/uL (ref 150–400)
RBC: 4.6 MIL/uL (ref 4.22–5.81)
RDW: 14.5 % (ref 11.5–15.5)
WBC: 6.5 10*3/uL (ref 4.0–10.5)

## 2015-07-28 LAB — BASIC METABOLIC PANEL
BUN: 7 mg/dL (ref 7–25)
CHLORIDE: 103 mmol/L (ref 98–110)
CO2: 25 mmol/L (ref 20–31)
CREATININE: 0.93 mg/dL (ref 0.70–1.25)
Calcium: 9.1 mg/dL (ref 8.6–10.3)
Glucose, Bld: 88 mg/dL (ref 65–99)
Potassium: 5.2 mmol/L (ref 3.5–5.3)
Sodium: 137 mmol/L (ref 135–146)

## 2015-07-28 LAB — PROTIME-INR
INR: 0.97 (ref <1.50)
Prothrombin Time: 13 seconds (ref 11.6–15.2)

## 2015-07-28 NOTE — Patient Instructions (Signed)
Medication Instructions:  Your physician recommends that you continue on your current medications as directed. Please refer to the Current Medication list given to you today.  Labwork: Your physician recommends that you have lab work today: BMP, CBC and PT/INR  Testing/Procedures: Your physician has requested that you have a peripheral vascular angiogram. This exam is performed at the hospital. During this exam IV contrast is used to look at arterial blood flow. Please review the information sheet given for details.  Follow-Up: Your physician recommends that you schedule a follow-up appointment in: 3 WEEKS with Dr Devin [name] for post-hospital follow-up   Any Other Special Instructions Will Be Listed Below (If Applicable).     If you need a refill on your cardiac medications before your next appointment, please call your pharmacy.  Smoking Cessation, Tips for Success If you are ready to quit smoking, congratulations! You have chosen to help yourself be healthier. Cigarettes bring nicotine, tar, carbon monoxide, and other irritants into your body. Your lungs, heart, and blood vessels will be able to work better without these poisons. There are many different ways to quit smoking. Nicotine gum, nicotine patches, a nicotine inhaler, or nicotine nasal spray can help with physical craving. Hypnosis, support groups, and medicines help break the habit of smoking. WHAT THINGS CAN I DO TO MAKE QUITTING EASIER?  Here are some tips to help you quit for good:  Pick a date when you will quit smoking completely. Tell all of your friends and family about your plan to quit on that date.  Do not try to slowly cut down on the number of cigarettes you are smoking. Pick a quit date and quit smoking completely starting on that day.  Throw away all cigarettes.   Clean and remove all ashtrays from your home, work, and car.  On a card, write down your reasons for quitting. Carry the card with you and read it  when you get the urge to smoke.  Cleanse your body of nicotine. Drink enough water and fluids to keep your urine clear or pale yellow. Do this after quitting to flush the nicotine from your body.  Learn to predict your moods. Do not let a bad situation be your excuse to have a cigarette. Some situations in your life might tempt you into wanting a cigarette.  Never have "just one" cigarette. It leads to wanting another and another. Remind yourself of your decision to quit.  Change habits associated with smoking. If you smoked while driving or when feeling stressed, try other activities to replace smoking. Stand up when drinking your coffee. Brush your teeth after eating. Sit in a different chair when you read the paper. Avoid alcohol while trying to quit, and try to drink fewer caffeinated beverages. Alcohol and caffeine may urge you to smoke.  Avoid foods and drinks that can trigger a desire to smoke, such as sugary or spicy foods and alcohol.  Ask people who smoke not to smoke around you.  Have something planned to do right after eating or having a cup of coffee. For example, plan to take a walk or exercise.  Try a relaxation exercise to calm you down and decrease your stress. Remember, you may be tense and nervous for the first 2 weeks after you quit, but this will pass.  Find new activities to keep your hands busy. Play with a pen, coin, or rubber band. Doodle or draw things on paper.  Brush your teeth right after eating. This will help cut  down on the craving for the taste of tobacco after meals. You can also try mouthwash.   Use oral substitutes in place of cigarettes. Try using lemon drops, carrots, cinnamon sticks, or chewing gum. Keep them handy so they are available when you have the urge to smoke.  When you have the urge to smoke, try deep breathing.  Designate your home as a nonsmoking area.  If you are a heavy smoker, ask your health care provider about a prescription for  nicotine chewing gum. It can ease your withdrawal from nicotine.  Reward yourself. Set aside the cigarette money you save and buy yourself something nice.  Look for support from others. Join a support group or smoking cessation program. Ask someone at home or at work to help you with your plan to quit smoking.  Always ask yourself, "Do I need this cigarette or is this just a reflex?" Tell yourself, "Today, I choose not to smoke," or "I do not want to smoke." You are reminding yourself of your decision to quit.  Do not replace cigarette smoking with electronic cigarettes (commonly called e-cigarettes). The safety of e-cigarettes is unknown, and some may contain harmful chemicals.  If you relapse, do not give up! Plan ahead and think about what you will do the next time you get the urge to smoke. HOW WILL I FEEL WHEN I QUIT SMOKING? You may have symptoms of withdrawal because your body is used to nicotine (the addictive substance in cigarettes). You may crave cigarettes, be irritable, feel very hungry, cough often, get headaches, or have difficulty concentrating. The withdrawal symptoms are only temporary. They are strongest when you first quit but will go away within 10-14 days. When withdrawal symptoms occur, stay in control. Think about your reasons for quitting. Remind yourself that these are signs that your body is healing and getting used to being without cigarettes. Remember that withdrawal symptoms are easier to treat than the major diseases that smoking can cause.  Even after the withdrawal is over, expect periodic urges to smoke. However, these cravings are generally short lived and will go away whether you smoke or not. Do not smoke! WHAT RESOURCES ARE AVAILABLE TO HELP ME QUIT SMOKING? Your health care provider can direct you to community resources or hospitals for support, which may include:  Group support.  Education.  Hypnosis.  Therapy.   This information is not intended to  replace advice given to you by your health care provider. Make sure you discuss any questions you have with your health care provider.   Document Released: 02/12/2004 Document Revised: 06/06/2014 Document Reviewed: 11/01/2012 Elsevier Interactive Patient Education [employer].

## 2015-07-28 NOTE — Progress Notes (Signed)
Cardiology Office Note   Date:  07/28/2015   ID:  Devin [name] 01/01/1946, MRN [id number]  PCP:  [name], MD  Cardiologist:  Dr. Burt Knack    Chief Complaint  Patient presents with  . Appointment    normal tiredness. no other complaints      History of Present Illness: Devin [name] is a 70 y.o. male who was referred by Dr. Burt Knack for management of PAD.  He has known history of CAD s/p CABG years ago, cardiac cath in 2007 showed patent grafts, PAD, hyperlipidemia and tobacco use. He is not diabetic.  He has prolonged history of PAD with known bilateral SFA disease. This has been managed medically.  Over the last year, he reports significant worsening of bilateral calf claudication which is equal in both sides. He progressed from being able to walk 1/4 mile without claudication last year to 30 feet now. He is very limited by his claudication. No rest pain or ulceration.   Noninvasive vascular evaluation showed an ABI of 0.54 on right and 0.61 on left. Duplex showed occluded mid-distal right SFA and severe stenosis in left SFA.     Past Medical History  Diagnosis Date  . Coronary atherosclerosis of artery bypass graft   . Personal history of unspecified circulatory disease   . 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 Chest pain, unspecified   . Other and unspecified hyperlipidemia   . TIA (transient ischemic attack)   . Carpal tunnel syndrome   . Asthma   . Shortness of breath   . Chronic kidney disease     hx of kidney stones  . Arthritis   . HOH (hard of hearing)     Past Surgical History  Procedure Laterality Date  . Neck surgery    . Carpal tunnel release      blateral carpal tunnel  . Coronary artery bypass graft      x5 12 yrs ago  . Back surgery      cervical disc     Current Outpatient Prescriptions  Medication Sig Dispense Refill  . aspirin (ASPIRIN LOW DOSE) 81 MG EC tablet Take 81 mg by  mouth daily.      . clopidogrel (PLAVIX) 75 MG tablet Take 1 tablet (75 mg total) by mouth daily. 90 tablet 3  . dorzolamide-timolol (COSOPT) 22.3-6.8 MG/ML ophthalmic solution 1 drop as directed.      . latanoprost (XALATAN) 0.005 % ophthalmic solution Place 1 drop into both eyes daily.     . nitroGLYCERIN (NITROSTAT) 0.4 MG SL tablet Place 1 tablet (0.4 mg total) under the tongue every 5 (five) minutes as needed for chest pain. 25 tablet 3  . omeprazole (PRILOSEC OTC) 20 MG tablet Take 20 mg by mouth daily.      . simvastatin (ZOCOR) 40 MG tablet Take 40 mg by mouth at bedtime.       No current facility-administered medications for this visit.    Allergies:   Aspirin    Social History:  The patient  reports that he has been smoking Cigarettes and Cigars.  He has a 50 pack-year smoking history. He does not have any smokeless tobacco history on file. He reports that he does not drink alcohol or use illicit drugs.   Family History:  The patient's family history is negative for Anesthesia problems, Hypotension, Malignant hyperthermia, and Pseudochol deficiency. No CAD   ROS:  Please see the  history of present illness.   Otherwise, review of systems are positive for dyspnea, cough and wheezing which is chronic.   All other systems are reviewed and negative.    PHYSICAL EXAM: VS:  BP 140/70 mmHg  Pulse 60  Ht 5\' 4"  (1.626 m)  Wt 151 lb (68.493 kg)  BMI 25.91 kg/m2 , BMI Body mass index is 25.91 kg/(m^2). GEN: Well nourished, well developed, in no acute distress HEENT: normal Neck: no JVD, carotid bruits, or masses Cardiac: RRR; no murmurs, rubs, or gallops,no edema  Respiratory:  clear to auscultation bilaterally, normal work of breathing GI: soft, nontender, nondistended, + BS MS: no deformity or atrophy Skin: warm and dry, no rash Neuro:  Strength and sensation are intact Psych: euthymic mood, full affect Vascular: Femoral pulse is normal, DP: 0 bilaterally, PT: +1 on left and 0  on right   EKG:  EKG is not ordered today.    Recent Labs: No results found for requested labs within last 365 days.    Lipid Panel No results found for: CHOL, TRIG, HDL, CHOLHDL, VLDL, LDLCALC, LDLDIRECT    Wt Readings from Last 3 Encounters:  07/28/15 151 lb (68.493 kg)  06/11/15 150 lb 6.4 oz (68.221 kg)  04/03/14 141 lb (63.957 kg)         ASSESSMENT AND PLAN:  1. PAD with severe bilateral calf claudication without critical limb ischemia (Rutherford class 3): due to bilateral SFA disease. Symptoms have progressed significantly over last year and clearly are lifestyle limiting now. Discussed different options and recommend proceeding with angiography and possible endovascular intervention. I discussed risks and benefits.  Plan access via left femoral artery.   2. CAD status post CABG: The patient is stable and has no symptoms of angina. He will remain on dual antiplatelet therapy and lipid-lowering.  3. Hyperlipidemia: Treated with simvastatin. Lipids have been followed by his PCP.  4. Tobacco abuse: I had a prolonged discussion about importance of complete cessation. I discussed the risks of progressing to critical limb ischemia.   Current medicines are reviewed with the patient today. The patient does not have concerns regarding medicines.      Signed, [name], MD  07/28/2015 10:03 AM    [place]

## 2015-07-31 ENCOUNTER — Ambulatory Visit: Payer: Medicare Other | Admitting: Cardiovascular Disease

## 2015-08-05 ENCOUNTER — Ambulatory Visit (HOSPITAL_COMMUNITY)
Admission: RE | Admit: 2015-08-05 | Discharge: 2015-08-05 | Disposition: A | Discharge status: Home / Self Care | Payer: Medicare Other | Source: Ambulatory Visit | Attending: Cardiovascular Disease | Admitting: Cardiovascular Disease

## 2015-08-05 ENCOUNTER — Encounter (HOSPITAL_COMMUNITY): Payer: Self-pay | Admitting: Cardiovascular Disease

## 2015-08-05 ENCOUNTER — Encounter (HOSPITAL_COMMUNITY)
Admission: RE | Disposition: A | Discharge status: Home / Self Care | Payer: Self-pay | Source: Ambulatory Visit | Attending: Cardiovascular Disease

## 2015-08-05 DIAGNOSIS — Z7902 Long term (current) use of antithrombotics/antiplatelets: Secondary | ICD-10-CM | POA: Diagnosis not present

## 2015-08-05 DIAGNOSIS — I25810 Atherosclerosis of coronary artery bypass graft(s) without angina pectoris: Secondary | ICD-10-CM | POA: Diagnosis not present

## 2015-08-05 DIAGNOSIS — E785 Hyperlipidemia, unspecified: Secondary | ICD-10-CM | POA: Insufficient documentation

## 2015-08-05 DIAGNOSIS — Z8673 Personal history of transient ischemic attack (TIA), and cerebral infarction without residual deficits: Secondary | ICD-10-CM | POA: Insufficient documentation

## 2015-08-05 DIAGNOSIS — Z7982 Long term (current) use of aspirin: Secondary | ICD-10-CM | POA: Diagnosis not present

## 2015-08-05 DIAGNOSIS — J45909 Unspecified asthma, uncomplicated: Secondary | ICD-10-CM | POA: Diagnosis not present

## 2015-08-05 DIAGNOSIS — I70213 Atherosclerosis of native arteries of extremities with intermittent claudication, bilateral legs: Secondary | ICD-10-CM | POA: Diagnosis not present

## 2015-08-05 DIAGNOSIS — N189 Chronic kidney disease, unspecified: Secondary | ICD-10-CM | POA: Insufficient documentation

## 2015-08-05 DIAGNOSIS — F17210 Nicotine dependence, cigarettes, uncomplicated: Secondary | ICD-10-CM | POA: Insufficient documentation

## 2015-08-05 DIAGNOSIS — I739 Peripheral vascular disease, unspecified: Secondary | ICD-10-CM | POA: Diagnosis present

## 2015-08-05 DIAGNOSIS — M1990 Unspecified osteoarthritis, unspecified site: Secondary | ICD-10-CM | POA: Insufficient documentation

## 2015-08-05 HISTORY — PX: LOWER EXTREMITY ANGIOGRAM: SHX5508

## 2015-08-05 HISTORY — PX: PERIPHERAL VASCULAR CATHETERIZATION: SHX172C

## 2015-08-05 SURGERY — ABDOMINAL AORTOGRAM

## 2015-08-05 MED ORDER — FENTANYL CITRATE (PF) 100 MCG/2ML IJ SOLN
INTRAMUSCULAR | Status: DC | PRN
  Administered 2015-08-05: 50 ug via INTRAVENOUS

## 2015-08-05 MED ORDER — SODIUM CHLORIDE 0.9 % IV SOLN: 250.0000 mL | INTRAVENOUS | Status: DC | PRN

## 2015-08-05 MED ORDER — SODIUM CHLORIDE 0.9% FLUSH: 3.0000 mL | INTRAVENOUS | Status: DC | PRN

## 2015-08-05 MED ORDER — MIDAZOLAM HCL 2 MG/2ML IJ SOLN
INTRAMUSCULAR | Status: DC | PRN
  Administered 2015-08-05 (×2): 1 mg via INTRAVENOUS

## 2015-08-05 MED ORDER — HEPARIN (PORCINE) IN NACL 2-0.9 UNIT/ML-% IJ SOLN
INTRAMUSCULAR | Status: DC | PRN
  Administered 2015-08-05: 1000 mL

## 2015-08-05 MED ORDER — SODIUM CHLORIDE 0.9 % IV SOLN: INTRAVENOUS | Status: AC

## 2015-08-05 MED ORDER — LIDOCAINE HCL (PF) 1 % IJ SOLN
INTRAMUSCULAR | Status: AC
  Filled 2015-08-05: qty 30

## 2015-08-05 MED ORDER — SODIUM CHLORIDE 0.9% FLUSH: 3.0000 mL | Freq: Two times a day (BID) | INTRAVENOUS | Status: DC

## 2015-08-05 MED ORDER — LIDOCAINE HCL (PF) 1 % IJ SOLN
INTRAMUSCULAR | Status: DC | PRN
  Administered 2015-08-05 (×2): 12 mL

## 2015-08-05 MED ORDER — MIDAZOLAM HCL 2 MG/2ML IJ SOLN
INTRAMUSCULAR | Status: AC
  Filled 2015-08-05: qty 2

## 2015-08-05 MED ORDER — HEPARIN (PORCINE) IN NACL 2-0.9 UNIT/ML-% IJ SOLN
INTRAMUSCULAR | Status: AC
  Filled 2015-08-05: qty 1000

## 2015-08-05 MED ORDER — IODIXANOL 320 MG/ML IV SOLN
INTRAVENOUS | Status: DC | PRN
  Administered 2015-08-05: 100 mL via INTRAVENOUS

## 2015-08-05 MED ORDER — FENTANYL CITRATE (PF) 100 MCG/2ML IJ SOLN
INTRAMUSCULAR | Status: AC
  Filled 2015-08-05: qty 2

## 2015-08-05 MED ORDER — SODIUM CHLORIDE 0.9 % IV SOLN
INTRAVENOUS | Status: DC
  Administered 2015-08-05: 07:00:00 via INTRAVENOUS

## 2015-08-05 SURGICAL SUPPLY — 8 items
CATH ANGIO 5F PIGTAIL 65CM (CATHETERS) ×3 IMPLANT
KIT PV (KITS) ×3 IMPLANT
SHEATH PINNACLE 5F 10CM (SHEATH) ×3 IMPLANT
STOPCOCK MORSE 400PSI 3WAY (MISCELLANEOUS) ×3 IMPLANT
SYRINGE MEDRAD AVANTA MACH 7 (SYRINGE) ×3 IMPLANT
TRANSDUCER W/STOPCOCK (MISCELLANEOUS) ×3 IMPLANT
TRAY PV CATH (CUSTOM PROCEDURE TRAY) ×3 IMPLANT
WIRE HITORQ VERSACORE ST 145CM (WIRE) ×3 IMPLANT

## 2015-08-05 NOTE — Progress Notes (Signed)
Site area: RFA Site Prior to Removal:  Level 0 Pressure Applied For:20 min Manual:   yes Patient Status During Pull:stable   Post Pull Site:  Level 0 Post Pull Instructions Given:  yes Post Pull Pulses Present: palpable Dressing Applied: clear  Bedrest begins @ 1010 till 1410 Comments:

## 2015-08-05 NOTE — Discharge Instructions (Signed)
Angiogram, Care After °Refer to this sheet in the next few weeks. These instructions provide you with information about caring for yourself after your procedure. Your health care provider may also give you more specific instructions. Your treatment has been planned according to current medical practices, but problems sometimes occur. Call your health care provider if you have any problems or questions after your procedure. °WHAT TO EXPECT AFTER THE PROCEDURE °After your procedure, it is typical to have the following: °· Bruising at the catheter insertion site that usually fades within 1-2 weeks. °· Blood collecting in the tissue (hematoma) that may be painful to the touch. It should usually decrease in size and tenderness within 1-2 weeks. °HOME CARE INSTRUCTIONS °· Take medicines only as directed by your health care provider. °· You may shower 24-48 hours after the procedure or as directed by your health care provider. Remove the bandage (dressing) and gently wash the site with plain soap and water. Pat the area dry with a clean towel. Do not rub the site, because this may cause bleeding. °· Do not take baths, swim, or use a hot tub until your health care provider approves. °· Check your insertion site every day for redness, swelling, or drainage. °· Do not apply powder or lotion to the site. °· Do not lift over 10 lb (4.5 kg) for 5 days after your procedure or as directed by your health care provider. °· Ask your health care provider when it is okay to: °¨ Return to work or school. °¨ Resume usual physical activities or sports. °¨ Resume sexual activity. °· Do not drive home if you are discharged the same day as the procedure. Have someone else drive you. °· You may drive 24 hours after the procedure unless otherwise instructed by your health care provider. °· Do not operate machinery or power tools for 24 hours after the procedure or as directed by your health care provider. °· If your procedure was done as an  outpatient procedure, which means that you went home the same day as your procedure, a responsible adult should be with you for the first 24 hours after you arrive home. °· Keep all follow-up visits as directed by your health care provider. This is important. °SEEK MEDICAL CARE IF: °· You have a fever. °· You have chills. °· You have increased bleeding from the catheter insertion site. Hold pressure on the site. °SEEK IMMEDIATE MEDICAL CARE IF: °· You have unusual pain at the catheter insertion site. °· You have redness, warmth, or swelling at the catheter insertion site. °· You have drainage (other than a small amount of blood on the dressing) from the catheter insertion site. °· The catheter insertion site is bleeding, and the bleeding does not stop after 30 minutes of holding steady pressure on the site. °· The area near or just beyond the catheter insertion site becomes pale, cool, tingly, or numb. °  °This information is not intended to replace advice given to you by your health care provider. Make sure you discuss any questions you have with your health care provider. °  °Document Released: 12/02/2004 Document Revised: 06/06/2014 Document Reviewed: 10/17/2012 °Elsevier Interactive Patient Education ©2016 [employer] ° °

## 2015-08-05 NOTE — Interval H&P Note (Signed)
History and Physical Interval Note:  08/05/2015 8:38 AM  Devin [name]  has presented today for surgery, with the diagnosis of pvd  The various methods of treatment have been discussed with the patient and family. After consideration of risks, benefits and other options for treatment, the patient has consented to  Procedure(s): Abdominal Aortogram (N/A) as a surgical intervention .  The patient's history has been reviewed, patient examined, no change in status, stable for surgery.  I have reviewed the patient's chart and labs.  Questions were answered to the patient's satisfaction.     [name]

## 2015-08-05 NOTE — H&P (View-Only) (Signed)
Cardiology Office Note   Date:  07/28/2015   ID:  Devin [name], Devin [name] [birth year], MRN [id number]  PCP:  [name], MD  Cardiologist:  Dr. Burt Knack    Chief Complaint  Patient presents with  . Appointment    normal tiredness. no other complaints      History of Present Illness: Devin [name] is a 70 y.o. male who was referred by Dr. Burt Knack for management of PAD.  He has known history of CAD s/p CABG years ago, cardiac cath in 2007 showed patent grafts, PAD, hyperlipidemia and tobacco use. He is not diabetic.  He has prolonged history of PAD with known bilateral SFA disease. This has been managed medically.  Over the last year, he reports significant worsening of bilateral calf claudication which is equal in both sides. He progressed from being able to walk 1/4 mile without claudication last year to 30 feet now. He is very limited by his claudication. No rest pain or ulceration.   Noninvasive vascular evaluation showed an ABI of 0.54 on right and 0.61 on left. Duplex showed occluded mid-distal right SFA and severe stenosis in left SFA.     Past Medical History  Diagnosis Date  . Coronary atherosclerosis of artery bypass graft   . Personal history of unspecified circulatory disease   . 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 Chest pain, unspecified   . Other and unspecified hyperlipidemia   . TIA (transient ischemic attack)   . Carpal tunnel syndrome   . Asthma   . Shortness of breath   . Chronic kidney disease     hx of kidney stones  . Arthritis   . HOH (hard of hearing)     Past Surgical History  Procedure Laterality Date  . Neck surgery    . Carpal tunnel release      blateral carpal tunnel  . Coronary artery bypass graft      x5 12 yrs ago  . Back surgery      cervical disc     Current Outpatient Prescriptions  Medication Sig Dispense Refill  . aspirin (ASPIRIN LOW DOSE) 81 MG EC tablet Take 81 mg by  mouth daily.      . clopidogrel (PLAVIX) 75 MG tablet Take 1 tablet (75 mg total) by mouth daily. 90 tablet 3  . dorzolamide-timolol (COSOPT) 22.3-6.8 MG/ML ophthalmic solution 1 drop as directed.      . latanoprost (XALATAN) 0.005 % ophthalmic solution Place 1 drop into both eyes daily.     . nitroGLYCERIN (NITROSTAT) 0.4 MG SL tablet Place 1 tablet (0.4 mg total) under the tongue every 5 (five) minutes as needed for chest pain. 25 tablet 3  . omeprazole (PRILOSEC OTC) 20 MG tablet Take 20 mg by mouth daily.      . simvastatin (ZOCOR) 40 MG tablet Take 40 mg by mouth at bedtime.       No current facility-administered medications for this visit.    Allergies:   Aspirin    Social History:  The patient  reports that he has been smoking Cigarettes and Cigars.  He has a 50 pack-year smoking history. He does not have any smokeless tobacco history on file. He reports that he does not drink alcohol or use illicit drugs.   Family History:  The patient's family history is negative for Anesthesia problems, Hypotension, Malignant hyperthermia, and Pseudochol deficiency. No CAD   ROS:  Please see the  history of present illness.   Otherwise, review of systems are positive for dyspnea, cough and wheezing which is chronic.   All other systems are reviewed and negative.    PHYSICAL EXAM: VS:  BP 140/70 mmHg  Pulse 60  Ht 5\' 4"  (1.626 m)  Wt 151 lb (68.493 kg)  BMI 25.91 kg/m2 , BMI Body mass index is 25.91 kg/(m^2). GEN: Well nourished, well developed, in no acute distress HEENT: normal Neck: no JVD, carotid bruits, or masses Cardiac: RRR; no murmurs, rubs, or gallops,no edema  Respiratory:  clear to auscultation bilaterally, normal work of breathing GI: soft, nontender, nondistended, + BS MS: no deformity or atrophy Skin: warm and dry, no rash Neuro:  Strength and sensation are intact Psych: euthymic mood, full affect Vascular: Femoral pulse is normal, DP: 0 bilaterally, PT: +1 on left and 0  on right   EKG:  EKG is not ordered today.    Recent Labs: No results found for requested labs within last 365 days.    Lipid Panel No results found for: CHOL, TRIG, HDL, CHOLHDL, VLDL, LDLCALC, LDLDIRECT    Wt Readings from Last 3 Encounters:  07/28/15 151 lb (68.493 kg)  06/11/15 150 lb 6.4 oz (68.221 kg)  04/03/14 141 lb (63.957 kg)         ASSESSMENT AND PLAN:  1. PAD with severe bilateral calf claudication without critical limb ischemia (Rutherford class 3): due to bilateral SFA disease. Symptoms have progressed significantly over last year and clearly are lifestyle limiting now. Discussed different options and recommend proceeding with angiography and possible endovascular intervention. I discussed risks and benefits.  Plan access via left femoral artery.   2. CAD status post CABG: The patient is stable and has no symptoms of angina. He will remain on dual antiplatelet therapy and lipid-lowering.  3. Hyperlipidemia: Treated with simvastatin. Lipids have been followed by his PCP.  4. Tobacco abuse: I had a prolonged discussion about importance of complete cessation. I discussed the risks of progressing to critical limb ischemia.   Current medicines are reviewed with the patient today. The patient does not have concerns regarding medicines.      Signed, [name], MD  07/28/2015 10:03 AM    [place]

## 2015-08-07 ENCOUNTER — Telehealth: Payer: Self-pay

## 2015-08-07 MED ORDER — CILOSTAZOL 100 MG PO TABS: 100.0000 mg | ORAL_TABLET | Freq: Two times a day (BID) | ORAL | Status: DC

## 2015-08-07 NOTE — Telephone Encounter (Signed)
Please ask him to stop Plavix and Start Pletal 50 mg bid for 2 weeks then 100 mg bid.              ----- Message -----     From: Sherren Mocha, MD     Sent: 08/05/2015 11:03 AM      To: Wellington Hampshire, MD        Absolutely. He had CABG no hx PCI. Quite honestly I'm not even sure why he's on DAPT:)    ----- Message -----     From: Wellington Hampshire, MD     Sent: 08/05/2015  9:40 AM      To: Sherren Mocha, MD        Ronalee Belts,     His disease is not amenable to endovascular intervention. Do you think it is ok to switch Plavix to Pletal? Thanks.     MA        22 mintute telephone call:  I spoke with the pt about the recommended medication change.  The pt is concerned about the cost of this medication and is unsure if his insurance will cover Pletal.  The pt normally gets his medication filled through [facility] but he does not want Rx sent until he knows if he can afford Pletal.  I advised the pt that I can send the Rx to a local pharmacy so that he can get a general idea in regards to out of pocket expense.  Rx sent to [facility].  The pt will touch base with the pharmacy to determine price.  If the pt can afford the medication then we will send Rx to [facility] for a 90 day supply.  The pt will contact the office next week with this information and he will continue plavix at this time.  The pt also wondered if he needs to keep his follow-up appointment with Dr Fletcher Anon on 08/18/15 or if this should be rescheduled. I will discuss his appointment with Dr Fletcher Anon.

## 2015-08-10 MED ORDER — CILOSTAZOL 100 MG PO TABS: 100.0000 mg | ORAL_TABLET | Freq: Two times a day (BID) | ORAL | Status: DC

## 2015-08-10 NOTE — Telephone Encounter (Signed)
Follow Up:; ° ° °Returning your call. °

## 2015-08-10 NOTE — Telephone Encounter (Signed)
I spoke with the pt and his wife and the pt picked up Pletal from local pharmacy and it cost $13.00.  The pt will start this medication tomorrow since he took plavix today.  The pt verbalized understanding to take one-half tablet by mouth twice a day for 2 weeks and then increase to one tablet by mouth twice a day.  Rx sent to mail order pharmacy. I will clarify if the pt needs appointment next week with Dr Fletcher Anon.

## 2015-08-10 NOTE — Telephone Encounter (Signed)
Devin Hampshire, MD  Barkley Boards, RN           As long as the groin site is not giving him any problems. I can otherwise see him in 3 months    I spoke with the pt and he denies any issues with his groin site at this time.  I have rescheduled follow-up appointment to 11/03/15 with Dr Fletcher Anon.  The pt will contact the office if he has any issues with his procedure site.

## 2015-08-18 ENCOUNTER — Ambulatory Visit: Payer: Medicare Other | Admitting: Cardiovascular Disease

## 2015-10-13 ENCOUNTER — Encounter: Payer: Self-pay | Admitting: Cardiovascular Disease

## 2015-10-13 ENCOUNTER — Ambulatory Visit (INDEPENDENT_AMBULATORY_CARE_PROVIDER_SITE_OTHER): Payer: Medicare Other | Admitting: Cardiovascular Disease

## 2015-10-13 VITALS — BP 147/77 | HR 72 | Ht 64.0 in | Wt 154.8 lb

## 2015-10-13 DIAGNOSIS — I25810 Atherosclerosis of coronary artery bypass graft(s) without angina pectoris: Secondary | ICD-10-CM

## 2015-10-13 DIAGNOSIS — I739 Peripheral vascular disease, unspecified: Secondary | ICD-10-CM

## 2015-10-13 NOTE — Progress Notes (Signed)
Cardiology Office Note   Date:  10/13/2015   ID:  Devin [name], DOB [birth year], MRN [id number]  PCP:  [name], MD  Cardiologist:  Dr. Burt Knack    Chief Complaint  Patient presents with  . Follow-up    patient reports no complaints      History of Present Illness: Devin [name] is a 70 y.o. male who Is here today for a follow-up visit regarding peripheral arterial disease. He has known history of CAD s/p CABG years ago, cardiac cath in 2007 showed patent grafts, PAD, hyperlipidemia and tobacco use. He is not diabetic.  He was seen recently for worsening bilateral calf claudication. Noninvasive vascular evaluation showed an ABI of 0.54 on right and 0.61 on left. Duplex showed occluded mid-distal right SFA and severe stenosis in left SFA.  I proceeded with angiography in March 2017 which showed long heavily calcified occlusion of the right SFA starting at the ostium with reconstitution distally via collaterals from the profunda with 2 vessel runoff below the knee. The left SFA was heavily calcified and diffusely diseased but not completely occluded. The patient was started on cilostazol with mild improvement in symptoms. He was instructed to start a walking program and he is walking daily now. He can walk about 8 minutes before he has to stop and rest.   Past Medical History  Diagnosis Date  . Coronary atherosclerosis of artery bypass graft   . Personal history of unspecified circulatory disease   . 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 Chest pain, unspecified   . Other and unspecified hyperlipidemia   . TIA (transient ischemic attack)   . Carpal tunnel syndrome   . Asthma   . Shortness of breath   . Chronic kidney disease     hx of kidney stones  . Arthritis   . HOH (hard of hearing)     Past Surgical History  Procedure Laterality Date  . Neck surgery    . Carpal tunnel release      blateral carpal tunnel  .  Coronary artery bypass graft      x5 12 yrs ago  . Back surgery      cervical disc  . Peripheral vascular catheterization N/A 08/05/2015    Procedure: Abdominal Aortogram;  Surgeon: Wellington Hampshire, MD;  Location: [place] [facility];  Service: Cardiovascular;  Laterality: N/A;  . Lower extremity angiogram Bilateral 08/05/2015    Procedure: Lower Extremity Angiogram;  Surgeon: Wellington Hampshire, MD;  Location: [place] [facility];  Service: Cardiovascular;  Laterality: Bilateral;     Current Outpatient Prescriptions  Medication Sig Dispense Refill  . aspirin (ASPIRIN LOW DOSE) 81 MG EC tablet Take 81 mg by mouth daily.      . cilostazol (PLETAL) 100 MG tablet Take 1 tablet (100 mg total) by mouth 2 (two) times daily. 180 tablet 3  . dorzolamide-timolol (COSOPT) 22.3-6.8 MG/ML ophthalmic solution Place 1 drop into both eyes daily.     Marland Kitchen latanoprost (XALATAN) 0.005 % ophthalmic solution Place 1 drop into both eyes daily.     . nitroGLYCERIN (NITROSTAT) 0.4 MG SL tablet Place 1 tablet (0.4 mg total) under the tongue every 5 (five) minutes as needed for chest pain. 25 tablet 3  . omeprazole (PRILOSEC OTC) 20 MG tablet Take 20 mg by mouth daily.      . simvastatin (ZOCOR) 40 MG tablet Take 40 mg by mouth at bedtime.  No current facility-administered medications for this visit.    Allergies:   Aspirin    Social History:  The patient  reports that he has quit smoking. His smoking use included Cigarettes and Cigars. He has a 50 pack-year smoking history. He does not have any smokeless tobacco history on file. He reports that he does not drink alcohol or use illicit drugs.   Family History:  The patient's family history is negative for Anesthesia problems, Hypotension, Malignant hyperthermia, and Pseudochol deficiency. No CAD   ROS:  Please see the history of present illness.   Otherwise, review of systems are positive for dyspnea, cough and wheezing which is chronic.   All other systems are  reviewed and negative.    PHYSICAL EXAM: VS:  BP 147/77 mmHg  Pulse 72  Ht 5\' 4"  (1.626 m)  Wt 154 lb 12.8 oz (70.217 kg)  BMI 26.56 kg/m2 , BMI Body mass index is 26.56 kg/(m^2). GEN: Well nourished, well developed, in no acute distress HEENT: normal Neck: no JVD, carotid bruits, or masses Cardiac: RRR; no murmurs, rubs, or gallops,no edema  Respiratory:  clear to auscultation bilaterally, normal work of breathing GI: soft, nontender, nondistended, + BS MS: no deformity or atrophy Skin: warm and dry, no rash Neuro:  Strength and sensation are intact Psych: euthymic mood, full affect Vascular: Femoral pulse is normal, DP: 0 bilaterally, PT: +1 on left and 0 on right   EKG:  EKG is not ordered today.    Recent Labs: 07/28/2015: BUN 7; Creat 0.93; Hemoglobin 13.2; Platelets 252; Potassium 5.2; Sodium 137    Lipid Panel No results found for: CHOL, TRIG, HDL, CHOLHDL, VLDL, LDLCALC, LDLDIRECT    Wt Readings from Last 3 Encounters:  10/13/15 154 lb 12.8 oz (70.217 kg)  08/05/15 150 lb (68.04 kg)  07/28/15 151 lb (68.493 kg)         ASSESSMENT AND PLAN:  1. PAD with severe bilateral calf claudication . due to bilateral SFA disease. I reviewed the angiogram with him which showed long flush occlusion of the right SFA. This is not ideal for endovascular intervention. He seems to be gradually improving with a walking program and cilostazol. Continue current management. Surgical revascularization can be considered as a last resort.   2. CAD status post CABG: The patient is stable and has no symptoms of angina. Continue aspirin daily. Plavix was discontinued after starting cilostazol.   3. Hyperlipidemia: Treated with simvastatin. Lipids have been followed by his PCP.  4. Tobacco abuse: He quit smoking recently.    Signed,  [name], MD  10/13/2015 9:38 AM    [place]

## 2015-10-13 NOTE — Patient Instructions (Signed)

## 2015-10-14 ENCOUNTER — Encounter: Payer: Self-pay | Admitting: Cardiovascular Disease

## 2015-11-03 ENCOUNTER — Ambulatory Visit: Payer: Medicare Other | Admitting: Cardiovascular Disease

## 2016-04-05 ENCOUNTER — Encounter: Payer: Self-pay | Admitting: Cardiovascular Disease

## 2016-04-12 ENCOUNTER — Ambulatory Visit (INDEPENDENT_AMBULATORY_CARE_PROVIDER_SITE_OTHER): Payer: Medicare Other | Admitting: Cardiovascular Disease

## 2016-04-12 ENCOUNTER — Encounter: Payer: Self-pay | Admitting: Cardiovascular Disease

## 2016-04-12 VITALS — BP 140/80 | HR 82 | Ht 64.0 in | Wt 157.8 lb

## 2016-04-12 DIAGNOSIS — I2510 Atherosclerotic heart disease of native coronary artery without angina pectoris: Secondary | ICD-10-CM | POA: Diagnosis not present

## 2016-04-12 DIAGNOSIS — I25810 Atherosclerosis of coronary artery bypass graft(s) without angina pectoris: Secondary | ICD-10-CM | POA: Diagnosis not present

## 2016-04-12 DIAGNOSIS — I739 Peripheral vascular disease, unspecified: Secondary | ICD-10-CM | POA: Diagnosis not present

## 2016-04-12 DIAGNOSIS — Z720 Tobacco use: Secondary | ICD-10-CM | POA: Diagnosis not present

## 2016-04-12 DIAGNOSIS — E7800 Pure hypercholesterolemia, unspecified: Secondary | ICD-10-CM

## 2016-04-12 MED ORDER — ATORVASTATIN CALCIUM 40 MG PO TABS: 40.0000 mg | ORAL_TABLET | Freq: Every day | ORAL | 3 refills | Status: DC

## 2016-04-12 NOTE — Patient Instructions (Signed)
Medication Instructions:  Your physician has recommended you make the following change in your medication:  1. STOP Simvastatin 2. START Atorvastatin 40mg  take one tablet by mouth daily with evening meal  Labwork: No new orders.   Testing/Procedures: No new orders.   Follow-Up: Your physician wants you to follow-up in: 1 YEAR with Dr Fletcher Anon.  You will receive a reminder letter in the mail two months in advance. If you don't receive a letter, please call our office to schedule the follow-up appointment.   Any Other Special Instructions Will Be Listed Below (If Applicable).     If you need a refill on your cardiac medications before your next appointment, please call your pharmacy.

## 2016-04-12 NOTE — Progress Notes (Signed)
Cardiology Office Note   Date:  04/12/2016   ID:  Devin [name], DOB 06-Nov-1945, MRN [id number]  PCP:  [name], MD  Cardiologist:  Dr. Burt Knack    No chief complaint on file.     History of Present Illness: Devin [name] is a 70 y.o. male who Is here today for a follow-up visit regarding peripheral arterial disease. He has known history of CAD s/p CABG years ago, cardiac cath in 2007 showed patent grafts, PAD, hyperlipidemia and tobacco use. He is not diabetic.  He was seen in March, 2017 for worsening bilateral calf claudication. Noninvasive vascular evaluation showed an ABI of 0.54 on right and 0.61 on left. Duplex showed occluded mid-distal right SFA and severe stenosis in left SFA.  Angiography in March 2017 showed long heavily calcified occlusion of the right SFA starting at the ostium with reconstitution distally via collaterals from the profunda with 2 vessel runoff below the knee. The left SFA was heavily calcified and diffusely diseased but not completely occluded. The patient was treated with cilostazol and a walking program with gradual improvement in symptoms. Currently his claudication is not lifestyle limiting. He does not get much discomfort. Walks at a relatively slow pace. Unfortunately, he resumed smoking 5-10 cigarettes per day. He has been trying to quit smoking. I reviewed his recent labs which showed anemia with a hemoglobin of 10.5 and an MCV of 77. I reviewed his lipid profile which showed total cholesterol of 157, triglyceride of 72, HDL of 44 and an LDL of 99.   Past Medical History:  Diagnosis Date  . Arthritis   . Asthma   . Carpal tunnel syndrome   . Chest pain, unspecified   . Chronic kidney disease    hx of kidney stones  . Coronary atherosclerosis of artery bypass graft   . HOH (hard of hearing)   . 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 Other and unspecified hyperlipidemia   . Personal  history of unspecified circulatory disease   . Shortness of breath   . TIA (transient ischemic attack)     Past Surgical History:  Procedure Laterality Date  . BACK SURGERY     cervical disc  . CARPAL TUNNEL RELEASE     blateral carpal tunnel  . CORONARY ARTERY BYPASS GRAFT     x5 12 yrs ago  . LOWER EXTREMITY ANGIOGRAM Bilateral 08/05/2015   Procedure: Lower Extremity Angiogram;  Surgeon: Wellington Hampshire, MD;  Location: [place] [facility];  Service: Cardiovascular;  Laterality: Bilateral;  . NECK SURGERY    . PERIPHERAL VASCULAR CATHETERIZATION N/A 08/05/2015   Procedure: Abdominal Aortogram;  Surgeon: Wellington Hampshire, MD;  Location: [place] [facility];  Service: Cardiovascular;  Laterality: N/A;     Current Outpatient Prescriptions  Medication Sig Dispense Refill  . aspirin (ASPIRIN LOW DOSE) 81 MG EC tablet Take 81 mg by mouth daily.      . Cholecalciferol (VITAMIN D3) 1000 units CAPS Take 2,000 Units by mouth daily.    . cilostazol (PLETAL) 100 MG tablet Take 1 tablet (100 mg total) by mouth 2 (two) times daily. 180 tablet 3  . dorzolamide-timolol (COSOPT) 22.3-6.8 MG/ML ophthalmic solution Place 1 drop into both eyes daily.     . IRON PO Take 1 tablet by mouth daily.    Marland Kitchen latanoprost (XALATAN) 0.005 % ophthalmic solution Place 1 drop into both eyes daily.     Marland Kitchen  Multiple Vitamins-Minerals (ZINC PO) Take 1 tablet by mouth daily.    . nitroGLYCERIN (NITROSTAT) 0.4 MG SL tablet Place 1 tablet (0.4 mg total) under the tongue every 5 (five) minutes as needed for chest pain. 25 tablet 3  . omeprazole (PRILOSEC OTC) 20 MG tablet Take 20 mg by mouth daily.      . simvastatin (ZOCOR) 40 MG tablet Take 40 mg by mouth at bedtime.       No current facility-administered medications for this visit.     Allergies:   Aspirin    Social History:  The patient  reports that he has quit smoking. His smoking use included Cigarettes and Cigars. He has a 50.00 pack-year smoking history. He has  never used smokeless tobacco. He reports that he does not drink alcohol or use drugs.   Family History:  The patient's family history is not on file. No CAD   ROS:  Please see the history of present illness.   Otherwise, review of systems are positive for dyspnea, cough and wheezing which is chronic.   All other systems are reviewed and negative.    PHYSICAL EXAM: VS:  BP 140/80   Pulse 82   Ht 5\' 4"  (1.626 m)   Wt 157 lb 12.8 oz (71.6 kg)   BMI 27.09 kg/m  , BMI Body mass index is 27.09 kg/m. GEN: Well nourished, well developed, in no acute distress  HEENT: normal  Neck: no JVD, carotid bruits, or masses Cardiac: RRR; no murmurs, rubs, or gallops,no edema  Respiratory:  clear to auscultation bilaterally, normal work of breathing GI: soft, nontender, nondistended, + BS MS: no deformity or atrophy  Skin: warm and dry, no rash Neuro:  Strength and sensation are intact Psych: euthymic mood, full affect Vascular: Femoral pulse is normal, DP: 0 bilaterally, PT: +1 on left and 0 on right   EKG:  EKG is not ordered today.    Recent Labs: 07/28/2015: BUN 7; Creat 0.93; Hemoglobin 13.2; Platelets 252; Potassium 5.2; Sodium 137    Lipid Panel No results found for: CHOL, TRIG, HDL, CHOLHDL, VLDL, LDLCALC, LDLDIRECT    Wt Readings from Last 3 Encounters:  04/12/16 157 lb 12.8 oz (71.6 kg)  10/13/15 154 lb 12.8 oz (70.2 kg)  08/05/15 150 lb (68 kg)         ASSESSMENT AND PLAN:  1. PAD with moderate bilateral calf claudication . due to bilateral SFA disease.  He is currently being treated medically with overall stable symptoms of claudication. His symptoms are currently not lifestyle limiting and thus I recommend continuing medical therapy. Continue treatment with cilostazol.  2. CAD status post CABG: The patient is stable and has no symptoms of angina. Continue aspirin daily.   3. Hyperlipidemia: His most recent LDL was 99 and not at target. Thus, I elected to switch him  from simvastatin to atorvastatin 40 mg once daily. He said he is going to have follow-up labs with his primary care physician. He should get a follow-up lipid and liver profile in 4-8 weeks.  4. Tobacco abuse: I again had a prolonged discussion with him about the importance of smoking cessation. I explained to him the high risk for progression to critical limb ischemia in smokers with peripheral arterial disease.  5. Recent iron deficiency anemia: Followed by primary care physician. Consider GI evaluation if not recently done.  F/U with me in 1 year.   Signed,  [name], MD  04/12/2016 10:36 AM  [facility]

## 2016-06-11 ENCOUNTER — Other Ambulatory Visit: Payer: Self-pay | Admitting: Cardiovascular Disease

## 2016-06-13 NOTE — Telephone Encounter (Signed)
Refill request

## 2016-07-06 ENCOUNTER — Ambulatory Visit (INDEPENDENT_AMBULATORY_CARE_PROVIDER_SITE_OTHER): Payer: Medicare Other | Admitting: Cardiovascular Disease

## 2016-07-06 ENCOUNTER — Encounter: Payer: Self-pay | Admitting: Cardiovascular Disease

## 2016-07-06 VITALS — BP 146/70 | HR 70 | Ht 64.0 in | Wt 156.8 lb

## 2016-07-06 DIAGNOSIS — I739 Peripheral vascular disease, unspecified: Secondary | ICD-10-CM

## 2016-07-06 DIAGNOSIS — I2510 Atherosclerotic heart disease of native coronary artery without angina pectoris: Secondary | ICD-10-CM

## 2016-07-06 MED ORDER — AMLODIPINE BESYLATE 5 MG PO TABS: 5.0000 mg | ORAL_TABLET | Freq: Every day | ORAL | 3 refills | Status: DC

## 2016-07-06 MED ORDER — CILOSTAZOL 100 MG PO TABS: 100.0000 mg | ORAL_TABLET | Freq: Two times a day (BID) | ORAL | 3 refills | Status: DC

## 2016-07-06 NOTE — Progress Notes (Signed)
Cardiology Office Note Date:  07/06/2016   ID:  [name], [name] 1945-07-06, MRN [id number]  PCP:  Devin Gravel, MD  Cardiologist:  Devin Mocha, MD    Chief Complaint  Patient presents with  . Claudication     History of Present Illness: Devin [name] is a 71 y.o. male who presents for follow-up of coronary and peripheral arterial disease. The patient underwent remote CABG. His last heart catheterization in 2007 demonstrated continued patency of all of his bypass grafts.  The patient is here with his wife today. He has lower extremity PAD with intermittent claudication and was evaluated by Dr. Fletcher Anon last year. He underwent angiography with recommendations for medical therapy because of unfavorable anatomy and his SFAs with heavy calcification in the occlusion.  The patient continues to smoke cigarettes. He walks to his mailbox once a day and has bilateral calf claudication with that level of activity. States that his mailbox is a pretty far walk. He is able to go there and back without stopping. Symptoms resolve with rest. Overall this is a stable pattern. He is not involved in any formal walking program. He denies chest pain, chest pressure, shortness of breath, edema, or heart palpitations.  Past Medical History:  Diagnosis Date  . Arthritis   . Asthma   . Carpal tunnel syndrome   . Chest pain, unspecified   . Chronic kidney disease    hx of kidney stones  . Coronary atherosclerosis of artery bypass graft   . HOH (hard of hearing)   . 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 Other and unspecified hyperlipidemia   . Personal history of unspecified circulatory disease   . Shortness of breath   . TIA (transient ischemic attack)     Past Surgical History:  Procedure Laterality Date  . BACK SURGERY     cervical disc  . CARPAL TUNNEL RELEASE     blateral carpal tunnel  . CORONARY ARTERY BYPASS GRAFT     x5 12 yrs ago  .  LOWER EXTREMITY ANGIOGRAM Bilateral 08/05/2015   Procedure: Lower Extremity Angiogram;  Surgeon: Wellington Hampshire, MD;  Location: [facility];  Service: Cardiovascular;  Laterality: Bilateral;  . NECK SURGERY    . PERIPHERAL VASCULAR CATHETERIZATION N/A 08/05/2015   Procedure: Abdominal Aortogram;  Surgeon: Wellington Hampshire, MD;  Location: [facility];  Service: Cardiovascular;  Laterality: N/A;    Current Outpatient Prescriptions  Medication Sig Dispense Refill  . aspirin (ASPIRIN LOW DOSE) 81 MG EC tablet Take 81 mg by mouth daily.      Marland Kitchen atorvastatin (LIPITOR) 40 MG tablet Take 1 tablet (40 mg total) by mouth daily. 90 tablet 3  . Cholecalciferol (VITAMIN D3) 1000 units CAPS Take 4,000 Units by mouth daily.     . cilostazol (PLETAL) 100 MG tablet Take 1 tablet (100 mg total) by mouth 2 (two) times daily. 180 tablet 3  . Cyanocobalamin (VITAMIN B12 PO) Take 2,500 mcg by mouth daily.    . dorzolamide-timolol (COSOPT) 22.3-6.8 MG/ML ophthalmic solution Place 1 drop into both eyes daily.     . IRON PO Take 1 tablet by mouth daily.    Marland Kitchen latanoprost (XALATAN) 0.005 % ophthalmic solution Place 1 drop into both eyes daily.     . Multiple Vitamins-Minerals (ZINC PO) Take 1 tablet by mouth daily.    . nitroGLYCERIN (NITROSTAT) 0.4 MG SL tablet Place 1 tablet (0.4 mg  total) under the tongue every 5 (five) minutes as needed for chest pain. 25 tablet 3  . omeprazole (PRILOSEC OTC) 20 MG tablet Take 20 mg by mouth daily.      Marland Kitchen amLODipine (NORVASC) 5 MG tablet Take 1 tablet (5 mg total) by mouth daily. 90 tablet 3   No current facility-administered medications for this visit.     Allergies:   Aspirin   Social History:  The patient  reports that he has quit smoking. His smoking use included Cigarettes and Cigars. He has a 50.00 pack-year smoking history. He has never used smokeless tobacco. He reports that he does not drink alcohol or use drugs.   Family History:  The patient's  family  history is not on file.    ROS:  Please see the history of present illness.  Otherwise, review of systems is positive for hearing loss, snoring, wheezing.  All other systems are reviewed and negative.    PHYSICAL EXAM: VS:  BP (!) 146/70   Pulse 70   Ht 5\' 4"  (1.626 m)   Wt 156 lb 12.8 oz (71.1 kg)   BMI 26.91 kg/m  , BMI Body mass index is 26.91 kg/m. GEN: Well nourished, well developed, in no acute distress  HEENT: normal  Neck: no JVD, no masses. No carotid bruits Cardiac: RRR without murmur or gallop                Respiratory:  clear to auscultation bilaterally, normal work of breathing GI: soft, nontender, nondistended, + BS MS: no deformity or atrophy  Ext: no pretibial edema Skin: warm and dry, no rash Neuro:  Strength and sensation are intact Psych: euthymic mood, full affect  EKG:  EKG is ordered today. The ekg ordered today shows  normal sinus rhythm 71 bpm, within normal limits. Recent Labs: 07/28/2015: BUN 7; Creat 0.93; Hemoglobin 13.2; Platelets 252; Potassium 5.2; Sodium 137   Lipid Panel  No results found for: CHOL, TRIG, HDL, CHOLHDL, VLDL, LDLCALC, LDLDIRECT    Wt Readings from Last 3 Encounters:  07/06/16 156 lb 12.8 oz (71.1 kg)  04/12/16 157 lb 12.8 oz (71.6 kg)  10/13/15 154 lb 12.8 oz (70.2 kg)    ASSESSMENT AND PLAN: 1.  CAD, native vessel, without symptoms of angina: The patient is doing well on his current medicines. He will continue on aspirin and atorvastatin.  2. Lower extremity peripheral arterial disease with intermittent claudication: He understands that increasing his walking will help him. I asked him to try to do a walk to his mailbox 2 times in a row and increased from there. He also had a lengthy discussion about tobacco cessation today.  3. Hypertension, uncontrolled: Reviewed his blood pressure in his systolic readings are consistently greater than 140. Advised him to start amlodipine 5 mg daily. Ideally he would be on an ACE  inhibitor but I reviewed past labs and he has mild hyperkalemia. I don't think he would tolerate an ACE or ARB very well.  4. Hyperlipidemia: Treated with atorvastatin. Labs are followed by his primary physician.  5. Tobacco abuse: We discussed the importance of tobacco cessation. I offered him options of pharmacotherapy, but he declines. He has tried about everything in the past without success. He did not tolerate Chantix previously.  Current medicines are reviewed with the patient today.  The patient does not have concerns regarding medicines.  Labs/ tests ordered today include:   Orders Placed This Encounter  Procedures  . EKG 12-Lead  Disposition:   FU one year  Signed, Devin Mocha, MD  07/06/2016 1:51 PM    [facility] [place], [place], [place]  [address] Phone: [phone]; Fax: [phone]

## 2016-07-06 NOTE — Patient Instructions (Signed)
Medication Instructions:  Your physician has recommended you make the following change in your medication:  1. START Amlodipine 5mg  take one tablet by mouth daily  Labwork: No new orders.   Testing/Procedures: No new orders.   Follow-Up: Your physician wants you to follow-up in: 1 YEAR with Dr Burt Knack.  You will receive a reminder letter in the mail two months in advance. If you don't receive a letter, please call our office to schedule the follow-up appointment.   Any Other Special Instructions Will Be Listed Below (If Applicable).     If you need a refill on your cardiac medications before your next appointment, please call your pharmacy.

## 2017-01-30 ENCOUNTER — Other Ambulatory Visit: Payer: Self-pay | Admitting: Cardiovascular Disease

## 2017-01-31 NOTE — Telephone Encounter (Signed)
Refill Request.  

## 2017-02-01 ENCOUNTER — Other Ambulatory Visit: Payer: Self-pay

## 2017-02-01 MED ORDER — ATORVASTATIN CALCIUM 40 MG PO TABS: 40.0000 mg | ORAL_TABLET | Freq: Every day | ORAL | 0 refills | Status: DC

## 2017-02-28 ENCOUNTER — Other Ambulatory Visit: Payer: Self-pay | Admitting: Cardiovascular Disease

## 2017-04-18 ENCOUNTER — Ambulatory Visit: Payer: Medicare Other | Admitting: Cardiovascular Disease

## 2017-04-18 ENCOUNTER — Other Ambulatory Visit: Payer: Self-pay | Admitting: Cardiovascular Disease

## 2017-05-02 ENCOUNTER — Ambulatory Visit (INDEPENDENT_AMBULATORY_CARE_PROVIDER_SITE_OTHER): Payer: Medicare Other | Admitting: Cardiovascular Disease

## 2017-05-02 ENCOUNTER — Encounter: Payer: Self-pay | Admitting: Cardiovascular Disease

## 2017-05-02 VITALS — BP 152/72 | HR 76 | Ht 64.0 in | Wt 163.4 lb

## 2017-05-02 DIAGNOSIS — Z720 Tobacco use: Secondary | ICD-10-CM

## 2017-05-02 DIAGNOSIS — I10 Essential (primary) hypertension: Secondary | ICD-10-CM | POA: Diagnosis not present

## 2017-05-02 DIAGNOSIS — E785 Hyperlipidemia, unspecified: Secondary | ICD-10-CM | POA: Diagnosis not present

## 2017-05-02 DIAGNOSIS — I739 Peripheral vascular disease, unspecified: Secondary | ICD-10-CM | POA: Diagnosis not present

## 2017-05-02 DIAGNOSIS — I25810 Atherosclerosis of coronary artery bypass graft(s) without angina pectoris: Secondary | ICD-10-CM

## 2017-05-02 NOTE — Progress Notes (Signed)
Cardiology Office Note   Date:  05/02/2017   ID:  RONIT MARCZAK, DOB Dec 25, 1945, MRN [id number]  PCP:  [name], MD  Cardiologist:  Dr. Burt Knack  No chief complaint on file.     History of Present Illness: Devin [name] is a 71 y.o. male who presents for  a follow-up visit regarding peripheral arterial disease. He has known history of CAD s/p CABG years ago, cardiac cath in 2007 showed patent grafts.  He has known history of hyperlipidemia and tobacco use. He is not diabetic.  He is known to have bilateral calf claudication worse on the right side. Workup in 2017 showed an ABI of 0.54 on right and 0.61 on left. Angiography in March 2017 showed long heavily calcified occlusion of the right SFA starting at the ostium with reconstitution distally via collaterals from the profunda with 2 vessel runoff below the knee. The left SFA was heavily calcified and diffusely diseased but not completely occluded. The patient was treated with cilostazol and a walking program with gradual improvement in symptoms.   During last visit, I switch him from simvastatin to atorvastatin.  His LDL was not at target. He has been doing reasonably well although he reports worsening dyspnea.  Bilateral leg claudication is stable overall but his feet are colder.  He has no ulceration or rest pain. Unfortunately, he continues to smoke.   Past Medical History:  Diagnosis Date  . Arthritis   . Asthma   . Carpal tunnel syndrome   . Chest pain, unspecified   . Chronic kidney disease    hx of kidney stones  . Coronary atherosclerosis of artery bypass graft   . HOH (hard of hearing)   . 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 Other and unspecified hyperlipidemia   . Personal history of unspecified circulatory disease   . Shortness of breath   . TIA (transient ischemic attack)     Past Surgical History:  Procedure Laterality Date  . BACK SURGERY     cervical disc  . CARPAL TUNNEL RELEASE     blateral carpal tunnel  . CORONARY ARTERY BYPASS GRAFT     x5 12 yrs ago  . LOWER EXTREMITY ANGIOGRAM Bilateral 08/05/2015   Procedure: Lower Extremity Angiogram;  Surgeon: Wellington Hampshire, MD;  Location: [facility];  Service: Cardiovascular;  Laterality: Bilateral;  . NECK SURGERY    . PERIPHERAL VASCULAR CATHETERIZATION N/A 08/05/2015   Procedure: Abdominal Aortogram;  Surgeon: Wellington Hampshire, MD;  Location: [facility];  Service: Cardiovascular;  Laterality: N/A;     Current Outpatient Medications  Medication Sig Dispense Refill  . aspirin (ASPIRIN LOW DOSE) 81 MG EC tablet Take 81 mg by mouth daily.      Marland Kitchen atorvastatin (LIPITOR) 40 MG tablet Take 1 tablet (40 mg total) by mouth daily. 90 tablet 0  . atorvastatin (LIPITOR) 40 MG tablet TAKE 1 TABLET BY MOUTH  DAILY 90 tablet 3  . Cholecalciferol (VITAMIN D3) 1000 units CAPS Take 4,000 Units by mouth daily.     . cilostazol (PLETAL) 100 MG tablet Take 1 tablet (100 mg total) by mouth 2 (two) times daily. 180 tablet 3  . Cyanocobalamin (VITAMIN B12 PO) Take 2,500 mcg by mouth daily.    . dorzolamide-timolol (COSOPT) 22.3-6.8 MG/ML ophthalmic solution Place 1 drop into both eyes daily.     . IRON PO Take 1 tablet by mouth daily.    Marland Kitchen  latanoprost (XALATAN) 0.005 % ophthalmic solution Place 1 drop into both eyes daily.     . Multiple Vitamins-Minerals (ZINC PO) Take 1 tablet by mouth daily.    . nitroGLYCERIN (NITROSTAT) 0.4 MG SL tablet Place 1 tablet (0.4 mg total) under the tongue every 5 (five) minutes as needed for chest pain. 25 tablet 3  . omeprazole (PRILOSEC OTC) 20 MG tablet Take 20 mg by mouth daily.      Marland Kitchen amLODipine (NORVASC) 5 MG tablet Take 1 tablet (5 mg total) by mouth daily. 90 tablet 3   No current facility-administered medications for this visit.     Allergies:   Aspirin    Social History:  The patient  reports that he has quit smoking. His smoking use  included cigarettes and cigars. He has a 50.00 pack-year smoking history. he has never used smokeless tobacco. He reports that he does not drink alcohol or use drugs.   Family History:  The patient's family history includes Arthritis in his unknown relative; Asthma in his unknown relative; Coronary artery disease in his unknown relative; Heart disease in his unknown relative.    ROS:  Please see the history of present illness.   Otherwise, review of systems are positive for none.   All other systems are reviewed and negative.    PHYSICAL EXAM: VS:  BP (!) 152/72   Pulse 76   Ht 5\' 4"  (1.626 m)   Wt 163 lb 6.4 oz (74.1 kg)   BMI 28.05 kg/m  , BMI Body mass index is 28.05 kg/m. GEN: Well nourished, well developed, in no acute distress  HEENT: normal  Neck: no JVD, carotid bruits, or masses Cardiac: RRR with premature beats; no murmurs, rubs, or gallops,no edema  Respiratory:  clear to auscultation bilaterally, normal work of breathing GI: soft, nontender, nondistended, + BS MS: no deformity or atrophy  Skin: warm and dry, no rash Neuro:  Strength and sensation are intact Psych: euthymic mood, full affect Vascular: Distal pulses are not palpable.  He has dependent rubor    EKG:  EKG is ordered today. The ekg ordered today demonstrates normal sinus rhythm with PVCs.   Recent Labs: No results found for requested labs within last 8760 hours.    Lipid Panel No results found for: CHOL, TRIG, HDL, CHOLHDL, VLDL, LDLCALC, LDLDIRECT    Wt Readings from Last 3 Encounters:  05/02/17 163 lb 6.4 oz (74.1 kg)  07/06/16 156 lb 12.8 oz (71.1 kg)  04/12/16 157 lb 12.8 oz (71.6 kg)      Other studies Reviewed: Additional studies/ records that were reviewed today include:n/a  No flowsheet data found.    ASSESSMENT AND PLAN:  1. PAD with moderate bilateral calf claudication . due to bilateral SFA disease.  Symptoms seem to be overall stable but his feet are colder.  He also has  more discoloration than before.  I requested a follow-up lower extremity arterial Doppler. I discussed with him the importance of regular walking as much as he is able to.  Unfortunately, he has been limited by increased shortness of breath likely due to continued smoking and COPD.  2. CAD status post CABG: The patient is stable and has no symptoms of angina. Continue aspirin daily.   3. Hyperlipidemia: I switch him last year to atorvastatin given that his LDL was not at target with simvastatin.  He got labs done with his primary care physician recently but these are not available.  I recommend a target LDL  of less than 70.  4. Tobacco abuse: I again had a prolonged discussion with him about the importance of smoking cessation.  He did not tolerate Chantix in the past due to bad dreams.  5.  Essential hypertension: Blood pressure is elevated.  He reports normal blood pressure reading at his primary care physician office recently.  If blood pressure continues to trend up, consider a thiazide diuretic given that he has mild hyperkalemia  And thus an ACE inhibitor or ARB might not be possible.   Disposition:   FU with me in 1 year  Signed,  [name], MD  05/02/2017 10:10 AM    [facility]

## 2017-05-02 NOTE — Patient Instructions (Signed)
Medication Instructions:  Your physician recommends that you continue on your current medications as directed. Please refer to the Current Medication list given to you today.   Labwork: none  Testing/Procedures: Your physician has requested that you have a lower extremity arterial doppler- During this test, ultrasound is used to evaluate arterial blood flow in the legs. Allow approximately one hour for this exam.    Follow-Up: Your physician wants you to follow-up in: 12 months with Dr. Fletcher Anon. You will receive a reminder letter in the mail two months in advance. If you don't receive a letter, please call our office to schedule the follow-up appointment.   Any Other Special Instructions Will Be Listed Below (If Applicable).  Your physician discussed the hazards of tobacco use. Tobacco use cessation is recommended and techniques and options to help you quit were discussed.   If you need a refill on your cardiac medications before your next appointment, please call your pharmacy.

## 2017-05-11 ENCOUNTER — Other Ambulatory Visit: Payer: Self-pay | Admitting: Cardiovascular Disease

## 2017-05-11 DIAGNOSIS — I739 Peripheral vascular disease, unspecified: Secondary | ICD-10-CM

## 2017-05-20 ENCOUNTER — Other Ambulatory Visit: Payer: Self-pay | Admitting: Cardiovascular Disease

## 2017-06-02 ENCOUNTER — Ambulatory Visit (HOSPITAL_COMMUNITY)
Admission: RE | Admit: 2017-06-02 | Discharge: 2017-06-02 | Disposition: A | Discharge status: Home / Self Care | Payer: Medicare Other | Source: Ambulatory Visit | Attending: Cardiovascular Disease | Admitting: Cardiovascular Disease

## 2017-06-02 DIAGNOSIS — I739 Peripheral vascular disease, unspecified: Secondary | ICD-10-CM | POA: Diagnosis present

## 2017-06-07 ENCOUNTER — Telehealth: Payer: Self-pay | Admitting: Cardiovascular Disease

## 2017-06-07 DIAGNOSIS — Z01818 Encounter for other preprocedural examination: Secondary | ICD-10-CM

## 2017-06-07 DIAGNOSIS — I739 Peripheral vascular disease, unspecified: Secondary | ICD-10-CM

## 2017-06-07 NOTE — Telephone Encounter (Signed)
PAD seems to be getting worse. He really needs to work on quitting smoking. I do recommend that we go ahead and schedule an abdominal aortogram with lower extremity run off and possible angioplasty. He had it done before in 2017. I can do on Wednesday. He needs pre cath labs.

## 2017-06-07 NOTE — Telephone Encounter (Signed)
Patient calling, states that after vascular tests that he cannot walk as well. Patient states that his right leg is worse than his left, patient states that he is experiencing pain in his leg.

## 2017-06-07 NOTE — Telephone Encounter (Signed)
Returned call to patient he wanted to let Dr.Arida know his legs are worse.Stated he received results of dopplers last week.Stated he is having pain in both legs,unable to walk any distance.Advised I will send message to [facility] for advice.

## 2017-06-07 NOTE — Telephone Encounter (Signed)
Returned call to patient.Spoke to wife Dr.Arida's recommendations given.Advised Dr.Arida's RN will call back tomorrow and schedule.

## 2017-06-08 ENCOUNTER — Encounter: Payer: Self-pay | Admitting: *Deleted

## 2017-06-08 NOTE — Telephone Encounter (Signed)
Returned the call to the patient. The patient has agreed to have a pv angiogram next Wednesday with Dr. Fletcher Anon at 10:30am.  He has been instructed to: *Be at the hospital by 8:30 *He will have labs done on Friday (tomorrow) *He will be NPO after midnight. *He will take all medication as prescribed including his 81 mg aspirin the morning of the procedure. *A letter will be mailed today with all instructions.   He has been educated on the procedure and has been instructed to call back with any further questions. He has verbalized his understanding.

## 2017-06-10 LAB — CBC
Hematocrit: 39.9 % (ref 37.5–51.0)
Hemoglobin: 13.7 g/dL (ref 13.0–17.7)
MCH: 32 pg (ref 26.6–33.0)
MCHC: 34.3 g/dL (ref 31.5–35.7)
MCV: 93 fL (ref 79–97)
Platelets: 173 10*3/uL (ref 150–379)
RBC: 4.28 x10E6/uL (ref 4.14–5.80)
RDW: 14.2 % (ref 12.3–15.4)
WBC: 6.1 10*3/uL (ref 3.4–10.8)

## 2017-06-10 LAB — BASIC METABOLIC PANEL
BUN/Creatinine Ratio: 8 — ABNORMAL LOW (ref 10–24)
BUN: 8 mg/dL (ref 8–27)
CHLORIDE: 106 mmol/L (ref 96–106)
CO2: 24 mmol/L (ref 20–29)
Calcium: 9.2 mg/dL (ref 8.6–10.2)
Creatinine, Ser: 0.97 mg/dL (ref 0.76–1.27)
GFR, EST AFRICAN AMERICAN: 91 mL/min/{1.73_m2} (ref >59)
GFR, EST NON AFRICAN AMERICAN: 79 mL/min/{1.73_m2} (ref >59)
Glucose: 95 mg/dL (ref 65–99)
POTASSIUM: 4.5 mmol/L (ref 3.5–5.2)
Sodium: 145 mmol/L — ABNORMAL HIGH (ref 134–144)

## 2017-06-10 LAB — PROTIME-INR
INR: 1 (ref 0.8–1.2)
PROTHROMBIN TIME: 10.4 s (ref 9.1–12.0)

## 2017-06-14 ENCOUNTER — Other Ambulatory Visit: Payer: Self-pay | Admitting: *Deleted

## 2017-06-14 ENCOUNTER — Telehealth: Payer: Self-pay | Admitting: Vascular Surgery

## 2017-06-14 ENCOUNTER — Ambulatory Visit (HOSPITAL_COMMUNITY)
Admission: RE | Admit: 2017-06-14 | Discharge: 2017-06-14 | Disposition: A | Discharge status: Home / Self Care | Payer: Medicare Other | Source: Ambulatory Visit | Attending: Cardiovascular Disease | Admitting: Cardiovascular Disease

## 2017-06-14 ENCOUNTER — Encounter (HOSPITAL_COMMUNITY)
Admission: RE | Disposition: A | Discharge status: Home / Self Care | Payer: Self-pay | Source: Ambulatory Visit | Attending: Cardiovascular Disease

## 2017-06-14 ENCOUNTER — Encounter (HOSPITAL_COMMUNITY): Payer: Self-pay

## 2017-06-14 ENCOUNTER — Other Ambulatory Visit: Payer: Self-pay

## 2017-06-14 DIAGNOSIS — Z79899 Other long term (current) drug therapy: Secondary | ICD-10-CM | POA: Diagnosis not present

## 2017-06-14 DIAGNOSIS — E785 Hyperlipidemia, unspecified: Secondary | ICD-10-CM | POA: Insufficient documentation

## 2017-06-14 DIAGNOSIS — M1990 Unspecified osteoarthritis, unspecified site: Secondary | ICD-10-CM | POA: Diagnosis not present

## 2017-06-14 DIAGNOSIS — Z87891 Personal history of nicotine dependence: Secondary | ICD-10-CM | POA: Insufficient documentation

## 2017-06-14 DIAGNOSIS — Z951 Presence of aortocoronary bypass graft: Secondary | ICD-10-CM | POA: Insufficient documentation

## 2017-06-14 DIAGNOSIS — I129 Hypertensive chronic kidney disease with stage 1 through stage 4 chronic kidney disease, or unspecified chronic kidney disease: Secondary | ICD-10-CM | POA: Diagnosis not present

## 2017-06-14 DIAGNOSIS — Z01812 Encounter for preprocedural laboratory examination: Secondary | ICD-10-CM

## 2017-06-14 DIAGNOSIS — I2510 Atherosclerotic heart disease of native coronary artery without angina pectoris: Secondary | ICD-10-CM | POA: Insufficient documentation

## 2017-06-14 DIAGNOSIS — I70223 Atherosclerosis of native arteries of extremities with rest pain, bilateral legs: Secondary | ICD-10-CM | POA: Diagnosis present

## 2017-06-14 DIAGNOSIS — E875 Hyperkalemia: Secondary | ICD-10-CM | POA: Diagnosis not present

## 2017-06-14 DIAGNOSIS — N189 Chronic kidney disease, unspecified: Secondary | ICD-10-CM | POA: Diagnosis not present

## 2017-06-14 DIAGNOSIS — Z8673 Personal history of transient ischemic attack (TIA), and cerebral infarction without residual deficits: Secondary | ICD-10-CM | POA: Insufficient documentation

## 2017-06-14 DIAGNOSIS — Z7982 Long term (current) use of aspirin: Secondary | ICD-10-CM | POA: Diagnosis not present

## 2017-06-14 DIAGNOSIS — I739 Peripheral vascular disease, unspecified: Secondary | ICD-10-CM

## 2017-06-14 DIAGNOSIS — I25810 Atherosclerosis of coronary artery bypass graft(s) without angina pectoris: Secondary | ICD-10-CM

## 2017-06-14 HISTORY — PX: LOWER EXTREMITY ANGIOGRAPHY: CATH118251

## 2017-06-14 HISTORY — PX: ABDOMINAL AORTOGRAM: CATH118222

## 2017-06-14 SURGERY — ABDOMINAL AORTOGRAM
Anesthesia: LOCAL

## 2017-06-14 MED ORDER — SODIUM CHLORIDE 0.9 % WEIGHT BASED INFUSION
3.0000 mL/kg/h | INTRAVENOUS | Status: AC
  Administered 2017-06-14: 3 mL/kg/h via INTRAVENOUS

## 2017-06-14 MED ORDER — SODIUM CHLORIDE 0.9% FLUSH: 3.0000 mL | Freq: Two times a day (BID) | INTRAVENOUS | Status: DC

## 2017-06-14 MED ORDER — HEPARIN (PORCINE) IN NACL 2-0.9 UNIT/ML-% IJ SOLN
INTRAMUSCULAR | Status: AC | PRN
  Administered 2017-06-14: 1000 mL

## 2017-06-14 MED ORDER — SODIUM CHLORIDE 0.9% FLUSH: 3.0000 mL | INTRAVENOUS | Status: DC | PRN

## 2017-06-14 MED ORDER — FENTANYL CITRATE (PF) 100 MCG/2ML IJ SOLN
INTRAMUSCULAR | Status: AC
  Filled 2017-06-14: qty 2

## 2017-06-14 MED ORDER — MIDAZOLAM HCL 2 MG/2ML IJ SOLN
INTRAMUSCULAR | Status: DC | PRN
  Administered 2017-06-14: 1 mg via INTRAVENOUS

## 2017-06-14 MED ORDER — FENTANYL CITRATE (PF) 100 MCG/2ML IJ SOLN
INTRAMUSCULAR | Status: DC | PRN
  Administered 2017-06-14: 25 ug via INTRAVENOUS

## 2017-06-14 MED ORDER — LIDOCAINE HCL (PF) 1 % IJ SOLN
INTRAMUSCULAR | Status: AC
  Filled 2017-06-14: qty 30

## 2017-06-14 MED ORDER — MIDAZOLAM HCL 2 MG/2ML IJ SOLN
INTRAMUSCULAR | Status: AC
  Filled 2017-06-14: qty 2

## 2017-06-14 MED ORDER — SODIUM CHLORIDE 0.9 % IV SOLN: 250.0000 mL | INTRAVENOUS | Status: DC | PRN

## 2017-06-14 MED ORDER — SODIUM CHLORIDE 0.9 % IV SOLN: INTRAVENOUS | Status: AC

## 2017-06-14 MED ORDER — SODIUM CHLORIDE 0.9 % WEIGHT BASED INFUSION: 1.0000 mL/kg/h | INTRAVENOUS | Status: DC

## 2017-06-14 MED ORDER — LIDOCAINE HCL (PF) 1 % IJ SOLN
INTRAMUSCULAR | Status: DC | PRN
  Administered 2017-06-14: 18 mL

## 2017-06-14 MED ORDER — HEPARIN (PORCINE) IN NACL 2-0.9 UNIT/ML-% IJ SOLN
INTRAMUSCULAR | Status: AC
  Filled 2017-06-14: qty 1000

## 2017-06-14 MED ORDER — IODIXANOL 320 MG/ML IV SOLN
INTRAVENOUS | Status: DC | PRN
  Administered 2017-06-14: 100 mL via INTRAVENOUS

## 2017-06-14 SURGICAL SUPPLY — 8 items
CATH OMNI FLUSH 5F 65CM (CATHETERS) ×3 IMPLANT
KIT MICROINTRODUCER STIFF 5F (SHEATH) ×3 IMPLANT
KIT PV (KITS) ×3 IMPLANT
SHEATH PINNACLE 5F 10CM (SHEATH) ×3 IMPLANT
SYRINGE MEDRAD AVANTA MACH 7 (SYRINGE) ×3 IMPLANT
TRANSDUCER W/STOPCOCK (MISCELLANEOUS) ×3 IMPLANT
TRAY PV CATH (CUSTOM PROCEDURE TRAY) ×3 IMPLANT
WIRE HITORQ VERSACORE ST 145CM (WIRE) ×3 IMPLANT

## 2017-06-14 NOTE — Telephone Encounter (Signed)
-----   Message from Mena Goes, RN sent at 06/14/2017 12:39 PM EST ----- Regarding: ASAP STRESS TEST, cardio is Dr. Burt Knack   ----- Message ----- From: Elam Dutch, MD Sent: 06/14/2017  12:12 PM To: [facility] Charge Pool  Pt needs right fem below knee pop bypass.  Please put on schedule in 2-3 weeks on whichever OR day looks best.  Please schedule stress test.   We need stress test results prior to his operation. His cardiologist is [place] [facility].  Level 5 consult for claudication and rest pain requesting [facility]

## 2017-06-14 NOTE — Consult Note (Signed)
Referring Physician: Dr Fletcher Anon  Patient name: Devin [name] MRN: [id number] DOB: 1945/10/08 Sex: male  REASON FOR CONSULT: claudication with early rest pain  HPI: Devin [name] is a 72 y.o. male with known history of claudication slowly worse over the last several years.  He has had prior right leg saphenous vein harvest for CABG.  He has pain in both feet that awaken him from sleep and bilateral calf pain that occurs with walking about one block.  He has no open wounds. Other medical problems include most likely a component of COPD with shortness of breath with activity, denies chest pain.  He also has a history of moderate carotid stenosis last duplex about 5 years ago. He is on pletal aspirin and a statin.  He is very dissatisfied that he can't keep up with his wife when shopping or do his yardwork.  He is trying to quit smoking.  3 min regarding smoking cessation today.  He has tried Chantix in the past and had bad dreams.  He has had some success with nicotine gum in the past.  Past Medical History:  Diagnosis Date  . Arthritis   . Asthma   . Carpal tunnel syndrome   . Chest pain, unspecified   . Chronic kidney disease    hx of kidney stones  . Coronary atherosclerosis of artery bypass graft   . HOH (hard of hearing)   . 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 Other and unspecified hyperlipidemia   . Personal history of unspecified circulatory disease   . Shortness of breath   . TIA (transient ischemic attack)    Past Surgical History:  Procedure Laterality Date  . BACK SURGERY     cervical disc  . CARPAL TUNNEL RELEASE     blateral carpal tunnel  . CORONARY ARTERY BYPASS GRAFT     x5 12 yrs ago  . LOWER EXTREMITY ANGIOGRAM Bilateral 08/05/2015   Procedure: Lower Extremity Angiogram;  Surgeon: Wellington Hampshire, MD;  Location: [facility];  Service: Cardiovascular;  Laterality: Bilateral;  . NECK SURGERY    .  PERIPHERAL VASCULAR CATHETERIZATION N/A 08/05/2015   Procedure: Abdominal Aortogram;  Surgeon: Wellington Hampshire, MD;  Location: [facility];  Service: Cardiovascular;  Laterality: N/A;    Family History  Problem Relation Age of Onset  . Heart disease Unknown   . Arthritis Unknown   . Asthma Unknown   . Coronary artery disease Unknown   . Anesthesia problems Neg Hx   . Hypotension Neg Hx   . Malignant hyperthermia Neg Hx   . Pseudochol deficiency Neg Hx     SOCIAL HISTORY: Social History   Socioeconomic History  . Marital status: Married    Spouse name: Not on file  . Number of children: Not on file  . Years of education: GED  . Highest education level: Not on file  Social Needs  . Financial resource strain: Not on file  . Food insecurity - worry: Not on file  . Food insecurity - inability: Not on file  . Transportation needs - medical: Not on file  . Transportation needs - non-medical: Not on file  Occupational History  . Occupation: fulltime   Tobacco Use  . Smoking status: Former Smoker    Packs/day: 1.00    Years: 50.00    Pack years: 50.00    Types: Cigarettes, Cigars    Last attempt to  quit: 06/07/2017    Years since quitting: 0.0  . Smokeless tobacco: Never Used  Substance and Sexual Activity  . Alcohol use: No    Alcohol/week: 0.0 oz  . Drug use: No  . Sexual activity: Yes  Other Topics Concern  . Not on file  Social History Narrative  . Not on file    Allergies  Allergen Reactions  . Aspirin Other (See Comments)    Has to take the coated aspirin   No current facility-administered medications on file prior to encounter.    Current Outpatient Medications on File Prior to Encounter  Medication Sig Dispense Refill  . amLODipine (NORVASC) 5 MG tablet Take 1 tablet (5 mg total) by mouth daily. Please call [phone] to schedule annual appointment with Dr. Burt Knack 06-2017 thanks. 90 tablet 3  . aspirin (ASPIRIN LOW DOSE) 81 MG EC tablet Take 81 mg by  mouth daily.      Marland Kitchen atorvastatin (LIPITOR) 40 MG tablet TAKE 1 TABLET BY MOUTH  DAILY 90 tablet 3  . Cholecalciferol (VITAMIN D3) 1000 units CAPS Take 4,000 Units by mouth daily.     . cilostazol (PLETAL) 100 MG tablet Take 1 tablet (100 mg total) by mouth 2 (two) times daily. 180 tablet 3  . Cyanocobalamin (VITAMIN B12 PO) Take 2,500 mcg by mouth daily.    . dorzolamide-timolol (COSOPT) 22.3-6.8 MG/ML ophthalmic solution Place 1 drop into both eyes daily.     . IRON PO Take 1 tablet by mouth daily.    Marland Kitchen latanoprost (XALATAN) 0.005 % ophthalmic solution Place 1 drop into both eyes at bedtime.     Marland Kitchen omeprazole (PRILOSEC OTC) 20 MG tablet Take 20 mg by mouth daily.      Marland Kitchen zinc gluconate 50 MG tablet Take 50 mg by mouth daily.    . nitroGLYCERIN (NITROSTAT) 0.4 MG SL tablet Place 1 tablet (0.4 mg total) under the tongue every 5 (five) minutes as needed for chest pain. 25 tablet 3     ROS:   General:  No weight loss, Fever, chills  HEENT: No recent headaches, no nasal bleeding, no visual changes, no sore throat  Neurologic: No dizziness, blackouts, seizures. No recent symptoms of stroke or mini- stroke. No recent episodes of slurred speech, or temporary blindness.  Cardiac: No recent episodes of chest pain/pressure, no shortness of breath at rest.  + shortness of breath with exertion.  Denies history of atrial fibrillation or irregular heartbeat  Vascular: +history of rest pain in feet.  + history of claudication.  No history of non-healing ulcer, No history of DVT   Pulmonary: No home oxygen, no productive cough, no hemoptysis,  No asthma or wheezing  Musculoskeletal:  [ ]  Arthritis, [ ]  Low back pain,  [ ]  Joint pain  Hematologic:No history of hypercoagulable state.  No history of easy bleeding.  No history of anemia  Gastrointestinal: No hematochezia or melena,  No gastroesophageal reflux, no trouble swallowing  Urinary: [ ]  chronic Kidney disease, [ ]  on HD - [ ]  MWF or [ ]  TTHS, [  ] Burning with urination, [ ]  Frequent urination, [ ]  Difficulty urinating;   Skin: No rashes  Psychological: No history of anxiety,  No history of depression   Physical Examination  Vitals:   06/14/17 1117 06/14/17 1122 06/14/17 1140 06/14/17 1145  BP: (!) 166/73 (!) 148/74 133/70 (!) 160/64  Pulse: 76 92 80 79  Resp: 14 19 13 17   Temp:  TempSrc:      SpO2: 98% 99% 94% 95%  Weight:      Height:        Body mass index is 27.46 kg/m.  General:  Alert and oriented, no acute distress HEENT: Normal Neck: No bruit or JVD Pulmonary: Clear to auscultation bilaterally Cardiac: Regular Rate and Rhythm without murmur Abdomen: Soft, non-tender, non-distended, no mass Skin: No rash, feet dusky bilaterally but warm Extremity Pulses:  2+ radial, brachial, left femoral, sheath right groin absent popliteal dorsalis pedis, posterior tibial pulses bilaterally Musculoskeletal: No deformity trace edema pedal pretibial bilaterally, left leg varicosities  Neurologic: Upper and lower extremity motor 5/5 and symmetric  DATA:  Pt had agram today.  I reviewed these images. Right CFA calcified but patent, right SFA flush occlusion with diseased but patent below knee pop, PT peroneal runoff.  Similar findings left leg although healthier popliteal and tibial arteries  ASSESSMENT:  Severe lifestyle limiting claudication with early rest pain   PLAN:  1.   Try to quit smoking 2. Will arrange for cardiac stress test.  Pt has seen Burt Knack in the past 3.  Elective right fem pop most likely followed by interval left fem pop after recovery from first case.   [name], MD [facility] Office: [phone] Pager: [phone]

## 2017-06-14 NOTE — Discharge Instructions (Signed)

## 2017-06-14 NOTE — Progress Notes (Signed)
Dr. Oneida Alar at bedside talking w/patient

## 2017-06-14 NOTE — H&P (View-Only) (Signed)
Referring Physician: Dr Fletcher Anon  Patient name: Devin [name] MRN: [id number] DOB: 06-07-1945 Sex: male  REASON FOR CONSULT: claudication with early rest pain  HPI: Devin [name] is a 72 y.o. male with known history of claudication slowly worse over the last several years.  He has had prior right leg saphenous vein harvest for CABG.  He has pain in both feet that awaken him from sleep and bilateral calf pain that occurs with walking about one block.  He has no open wounds. Other medical problems include most likely a component of COPD with shortness of breath with activity, denies chest pain.  He also has a history of moderate carotid stenosis last duplex about 5 years ago. He is on pletal aspirin and a statin.  He is very dissatisfied that he can't keep up with his wife when shopping or do his yardwork.  He is trying to quit smoking.  3 min regarding smoking cessation today.  He has tried Chantix in the past and had bad dreams.  He has had some success with nicotine gum in the past.  Past Medical History:  Diagnosis Date  . Arthritis   . Asthma   . Carpal tunnel syndrome   . Chest pain, unspecified   . Chronic kidney disease    hx of kidney stones  . Coronary atherosclerosis of artery bypass graft   . HOH (hard of hearing)   . 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 Other and unspecified hyperlipidemia   . Personal history of unspecified circulatory disease   . Shortness of breath   . TIA (transient ischemic attack)    Past Surgical History:  Procedure Laterality Date  . BACK SURGERY     cervical disc  . CARPAL TUNNEL RELEASE     blateral carpal tunnel  . CORONARY ARTERY BYPASS GRAFT     x5 12 yrs ago  . LOWER EXTREMITY ANGIOGRAM Bilateral 08/05/2015   Procedure: Lower Extremity Angiogram;  Surgeon: Wellington Hampshire, MD;  Location: [facility];  Service: Cardiovascular;  Laterality: Bilateral;  . NECK SURGERY    .  PERIPHERAL VASCULAR CATHETERIZATION N/A 08/05/2015   Procedure: Abdominal Aortogram;  Surgeon: Wellington Hampshire, MD;  Location: [facility];  Service: Cardiovascular;  Laterality: N/A;    Family History  Problem Relation Age of Onset  . Heart disease Unknown   . Arthritis Unknown   . Asthma Unknown   . Coronary artery disease Unknown   . Anesthesia problems Neg Hx   . Hypotension Neg Hx   . Malignant hyperthermia Neg Hx   . Pseudochol deficiency Neg Hx     SOCIAL HISTORY: Social History   Socioeconomic History  . Marital status: Married    Spouse name: Not on file  . Number of children: Not on file  . Years of education: GED  . Highest education level: Not on file  Social Needs  . Financial resource strain: Not on file  . Food insecurity - worry: Not on file  . Food insecurity - inability: Not on file  . Transportation needs - medical: Not on file  . Transportation needs - non-medical: Not on file  Occupational History  . Occupation: fulltime   Tobacco Use  . Smoking status: Former Smoker    Packs/day: 1.00    Years: 50.00    Pack years: 50.00    Types: Cigarettes, Cigars    Last attempt to  quit: 06/07/2017    Years since quitting: 0.0  . Smokeless tobacco: Never Used  Substance and Sexual Activity  . Alcohol use: No    Alcohol/week: 0.0 oz  . Drug use: No  . Sexual activity: Yes  Other Topics Concern  . Not on file  Social History Narrative  . Not on file    Allergies  Allergen Reactions  . Aspirin Other (See Comments)    Has to take the coated aspirin   No current facility-administered medications on file prior to encounter.    Current Outpatient Medications on File Prior to Encounter  Medication Sig Dispense Refill  . amLODipine (NORVASC) 5 MG tablet Take 1 tablet (5 mg total) by mouth daily. Please call [phone] to schedule annual appointment with Dr. Burt Knack 06-2017 thanks. 90 tablet 3  . aspirin (ASPIRIN LOW DOSE) 81 MG EC tablet Take 81 mg by  mouth daily.      Marland Kitchen atorvastatin (LIPITOR) 40 MG tablet TAKE 1 TABLET BY MOUTH  DAILY 90 tablet 3  . Cholecalciferol (VITAMIN D3) 1000 units CAPS Take 4,000 Units by mouth daily.     . cilostazol (PLETAL) 100 MG tablet Take 1 tablet (100 mg total) by mouth 2 (two) times daily. 180 tablet 3  . Cyanocobalamin (VITAMIN B12 PO) Take 2,500 mcg by mouth daily.    . dorzolamide-timolol (COSOPT) 22.3-6.8 MG/ML ophthalmic solution Place 1 drop into both eyes daily.     . IRON PO Take 1 tablet by mouth daily.    Marland Kitchen latanoprost (XALATAN) 0.005 % ophthalmic solution Place 1 drop into both eyes at bedtime.     Marland Kitchen omeprazole (PRILOSEC OTC) 20 MG tablet Take 20 mg by mouth daily.      Marland Kitchen zinc gluconate 50 MG tablet Take 50 mg by mouth daily.    . nitroGLYCERIN (NITROSTAT) 0.4 MG SL tablet Place 1 tablet (0.4 mg total) under the tongue every 5 (five) minutes as needed for chest pain. 25 tablet 3     ROS:   General:  No weight loss, Fever, chills  HEENT: No recent headaches, no nasal bleeding, no visual changes, no sore throat  Neurologic: No dizziness, blackouts, seizures. No recent symptoms of stroke or mini- stroke. No recent episodes of slurred speech, or temporary blindness.  Cardiac: No recent episodes of chest pain/pressure, no shortness of breath at rest.  + shortness of breath with exertion.  Denies history of atrial fibrillation or irregular heartbeat  Vascular: +history of rest pain in feet.  + history of claudication.  No history of non-healing ulcer, No history of DVT   Pulmonary: No home oxygen, no productive cough, no hemoptysis,  No asthma or wheezing  Musculoskeletal:  [ ]  Arthritis, [ ]  Low back pain,  [ ]  Joint pain  Hematologic:No history of hypercoagulable state.  No history of easy bleeding.  No history of anemia  Gastrointestinal: No hematochezia or melena,  No gastroesophageal reflux, no trouble swallowing  Urinary: [ ]  chronic Kidney disease, [ ]  on HD - [ ]  MWF or [ ]  TTHS, [  ] Burning with urination, [ ]  Frequent urination, [ ]  Difficulty urinating;   Skin: No rashes  Psychological: No history of anxiety,  No history of depression   Physical Examination  Vitals:   06/14/17 1117 06/14/17 1122 06/14/17 1140 06/14/17 1145  BP: (!) 166/73 (!) 148/74 133/70 (!) 160/64  Pulse: 76 92 80 79  Resp: 14 19 13 17   Temp:  TempSrc:      SpO2: 98% 99% 94% 95%  Weight:      Height:        Body mass index is 27.46 kg/m.  General:  Alert and oriented, no acute distress HEENT: Normal Neck: No bruit or JVD Pulmonary: Clear to auscultation bilaterally Cardiac: Regular Rate and Rhythm without murmur Abdomen: Soft, non-tender, non-distended, no mass Skin: No rash, feet dusky bilaterally but warm Extremity Pulses:  2+ radial, brachial, left femoral, sheath right groin absent popliteal dorsalis pedis, posterior tibial pulses bilaterally Musculoskeletal: No deformity trace edema pedal pretibial bilaterally, left leg varicosities  Neurologic: Upper and lower extremity motor 5/5 and symmetric  DATA:  Pt had agram today.  I reviewed these images. Right CFA calcified but patent, right SFA flush occlusion with diseased but patent below knee pop, PT peroneal runoff.  Similar findings left leg although healthier popliteal and tibial arteries  ASSESSMENT:  Severe lifestyle limiting claudication with early rest pain   PLAN:  1.   Try to quit smoking 2. Will arrange for cardiac stress test.  Pt has seen Burt Knack in the past 3.  Elective right fem pop most likely followed by interval left fem pop after recovery from first case.   [name], MD [facility] Office: [phone] Pager: [phone]

## 2017-06-14 NOTE — Telephone Encounter (Signed)
Sched stress test 06/19/17 at 7:45 am. A nurse form dr. Antionette Char office will call the pt to go over their meds and instructions. I left a message on pt's hm# to inform pt of time, date, and that a nurse from [facility] office will call them. Sent precert staff message.

## 2017-06-14 NOTE — H&P (Signed)
Date:  05/02/2017   [name], DOB [birth year], MRN [id number]  PCP:  [name], MD            Cardiologist:  Dr. Burt Knack  No chief complaint on file.     History of Present Illness: Devin [name] is a 72 y.o. male who presents for  a follow-up visit regarding peripheral arterial disease. He has known history of CAD s/p CABG years ago, cardiac cath in 2007 showed patent grafts.  He has known history of hyperlipidemia and tobacco use. He is not diabetic.  He is known to have bilateral calf claudication worse on the right side. Workup in 2017 showed an ABI of 0.54 on right and 0.61 on left. Angiography in March 2017 showed long heavily calcified occlusion of the right SFA starting at the ostium with reconstitution distally via collaterals from the profunda with 2 vessel runoff below the knee. The left SFA was heavily calcified and diffusely diseased but not completely occluded. The patient was treated with cilostazol and a walking program with gradual improvement in symptoms.   During last visit, I switch him from simvastatin to atorvastatin.  His LDL was not at target. He has been doing reasonably well although he reports worsening dyspnea.  Bilateral leg claudication is stable overall but his feet are colder.  He has no ulceration or rest pain. Unfortunately, he continues to smoke.       Past Medical History:  Diagnosis Date  . Arthritis   . Asthma   . Carpal tunnel syndrome   . Chest pain, unspecified   . Chronic kidney disease    hx of kidney stones  . Coronary atherosclerosis of artery bypass graft   . HOH (hard of hearing)   . 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 Other and unspecified hyperlipidemia   . Personal history of unspecified circulatory disease   . Shortness of breath   . TIA (transient ischemic attack)          Past Surgical History:  Procedure Laterality Date  . BACK  SURGERY     cervical disc  . CARPAL TUNNEL RELEASE     blateral carpal tunnel  . CORONARY ARTERY BYPASS GRAFT     x5 12 yrs ago  . LOWER EXTREMITY ANGIOGRAM Bilateral 08/05/2015   Procedure: Lower Extremity Angiogram;  Surgeon: Wellington Hampshire, MD;  Location: [facility];  Service: Cardiovascular;  Laterality: Bilateral;  . NECK SURGERY    . PERIPHERAL VASCULAR CATHETERIZATION N/A 08/05/2015   Procedure: Abdominal Aortogram;  Surgeon: Wellington Hampshire, MD;  Location: [facility];  Service: Cardiovascular;  Laterality: N/A;           Current Outpatient Medications  Medication Sig Dispense Refill  . aspirin (ASPIRIN LOW DOSE) 81 MG EC tablet Take 81 mg by mouth daily.      Marland Kitchen atorvastatin (LIPITOR) 40 MG tablet Take 1 tablet (40 mg total) by mouth daily. 90 tablet 0  . atorvastatin (LIPITOR) 40 MG tablet TAKE 1 TABLET BY MOUTH  DAILY 90 tablet 3  . Cholecalciferol (VITAMIN D3) 1000 units CAPS Take 4,000 Units by mouth daily.     . cilostazol (PLETAL) 100 MG tablet Take 1 tablet (100 mg total) by mouth 2 (two) times daily. 180 tablet 3  . Cyanocobalamin (VITAMIN B12 PO) Take 2,500 mcg by mouth daily.    . dorzolamide-timolol (COSOPT) 22.3-6.8 MG/ML ophthalmic solution  Place 1 drop into both eyes daily.     . IRON PO Take 1 tablet by mouth daily.    Marland Kitchen latanoprost (XALATAN) 0.005 % ophthalmic solution Place 1 drop into both eyes daily.     . Multiple Vitamins-Minerals (ZINC PO) Take 1 tablet by mouth daily.    . nitroGLYCERIN (NITROSTAT) 0.4 MG SL tablet Place 1 tablet (0.4 mg total) under the tongue every 5 (five) minutes as needed for chest pain. 25 tablet 3  . omeprazole (PRILOSEC OTC) 20 MG tablet Take 20 mg by mouth daily.      Marland Kitchen amLODipine (NORVASC) 5 MG tablet Take 1 tablet (5 mg total) by mouth daily. 90 tablet 3   No current facility-administered medications for this visit.     Allergies:   Aspirin    Social History:  The patient   reports that he has quit smoking. His smoking use included cigarettes and cigars. He has a 50.00 pack-year smoking history. he has never used smokeless tobacco. He reports that he does not drink alcohol or use drugs.   Family History:  The patient's family history includes Arthritis in his unknown relative; Asthma in his unknown relative; Coronary artery disease in his unknown relative; Heart disease in his unknown relative.    ROS:  Please see the history of present illness.   Otherwise, review of systems are positive for none.   All other systems are reviewed and negative.    PHYSICAL EXAM: VS:  BP (!) 152/72   Pulse 76   Ht 5\' 4"  (1.626 m)   Wt 163 lb 6.4 oz (74.1 kg)   BMI 28.05 kg/m  , BMI Body mass index is 28.05 kg/m. GEN: Well nourished, well developed, in no acute distress  HEENT: normal  Neck: no JVD, carotid bruits, or masses Cardiac: RRR with premature beats; no murmurs, rubs, or gallops,no edema  Respiratory:  clear to auscultation bilaterally, normal work of breathing GI: soft, nontender, nondistended, + BS MS: no deformity or atrophy  Skin: warm and dry, no rash Neuro:  Strength and sensation are intact Psych: euthymic mood, full affect Vascular: Distal pulses are not palpable.  He has dependent rubor    EKG:  EKG is ordered today. The ekg ordered today demonstrates normal sinus rhythm with PVCs.   Recent Labs: No results found for requested labs within last 8760 hours.    Lipid Panel Labs(Brief)  No results found for: CHOL, TRIG, HDL, CHOLHDL, VLDL, LDLCALC, LDLDIRECT         Wt Readings from Last 3 Encounters:  05/02/17 163 lb 6.4 oz (74.1 kg)  07/06/16 156 lb 12.8 oz (71.1 kg)  04/12/16 157 lb 12.8 oz (71.6 kg)      Other studies Reviewed: Additional studies/ records that were reviewed today include:n/a  No flowsheet data found.    ASSESSMENT AND PLAN:  1. PAD with moderate bilateral calf claudication . due to  bilateral SFA disease.  Symptoms seem to be overall stable but his feet are colder.  He also has more discoloration than before.  I requested a follow-up lower extremity arterial Doppler. I discussed with him the importance of regular walking as much as he is able to.  Unfortunately, he has been limited by increased shortness of breath likely due to continued smoking and COPD.  2. CAD status post CABG: The patient is stable and has no symptoms of angina. Continue aspirin daily.   3. Hyperlipidemia: I switch him last year to atorvastatin given  that his LDL was not at target with simvastatin.  He got labs done with his primary care physician recently but these are not available.  I recommend a target LDL of less than 70.  4. Tobacco abuse: I again had a prolonged discussion with him about the importance of smoking cessation.  He did not tolerate Chantix in the past due to bad dreams.  5.  Essential hypertension: Blood pressure is elevated.  He reports normal blood pressure reading at his primary care physician office recently.  If blood pressure continues to trend up, consider a thiazide diuretic given that he has mild hyperkalemia  And thus an ACE inhibitor or ARB might not be possible.    Signed,  [name], MD  05/02/2017 10:10 AM    [facility]  Addendum on January 16: Since his last visit, the patient underwent lower extremity arterial Doppler which showed worsening ABI bilaterally.  The patient called our office back and reported worsening claudication with minimal distance of walking and he also has been having symptoms at rest especially at night.  Due to that, I recommended proceeding with angiography and possible endovascular intervention. The patient's physical exam is unchanged with no lower extremity ulceration.  Distal pulses are not palpable. I answered the patient's questions about the procedure.  Rod Can, MD

## 2017-06-14 NOTE — Progress Notes (Signed)
Site area: rt groin fa sheath Site Prior to Removal:  Level 0 Pressure Applied For: 20 minutes Manual:   yes Patient Status During Pull:  stable Post Pull Site:  Level 0 Post Pull Instructions Given:  yes Post Pull Pulses Present: dopplered Dressing Applied:  Gauze and tegaderm Bedrest begins @ 7672 Comments:

## 2017-06-15 ENCOUNTER — Encounter (HOSPITAL_COMMUNITY): Payer: Self-pay | Admitting: Cardiovascular Disease

## 2017-06-15 ENCOUNTER — Telehealth (HOSPITAL_COMMUNITY): Payer: Self-pay

## 2017-06-15 NOTE — Telephone Encounter (Signed)
Patient given detailed instructions per Myocardial Perfusion Study Information Sheet for the test on 06/21/2017 at 7:45. Patient notified to arrive 15 minutes early and that it is imperative to arrive on time for appointment to keep from having the test rescheduled.  If you need to cancel or reschedule your appointment, please call the office within 24 hours of your appointment. . Patient verbalized understanding.EHK

## 2017-06-19 ENCOUNTER — Encounter (HOSPITAL_COMMUNITY): Payer: Medicare Other

## 2017-06-19 ENCOUNTER — Telehealth (HOSPITAL_COMMUNITY): Payer: Self-pay | Admitting: *Deleted

## 2017-06-19 NOTE — Telephone Encounter (Signed)
Patient given detailed instructions per Myocardial Perfusion Study Information Sheet for the test on 06/21/17 at 0745. Patient notified to arrive 15 minutes early and that it is imperative to arrive on time for appointment to keep from having the test rescheduled.  If you need to cancel or reschedule your appointment, please call the office within 24 hours of your appointment. . Patient verbalized understanding.Devin [name], [name]

## 2017-06-21 ENCOUNTER — Ambulatory Visit (HOSPITAL_COMMUNITY): Payer: Medicare Other | Attending: Cardiovascular Disease

## 2017-06-21 DIAGNOSIS — Z01810 Encounter for preprocedural cardiovascular examination: Secondary | ICD-10-CM | POA: Diagnosis not present

## 2017-06-21 DIAGNOSIS — Z01812 Encounter for preprocedural laboratory examination: Secondary | ICD-10-CM | POA: Diagnosis not present

## 2017-06-21 DIAGNOSIS — I25810 Atherosclerosis of coronary artery bypass graft(s) without angina pectoris: Secondary | ICD-10-CM | POA: Diagnosis not present

## 2017-06-21 LAB — MYOCARDIAL PERFUSION IMAGING
CHL CUP NUCLEAR SDS: 0
CHL CUP NUCLEAR SRS: 7
CHL CUP RESTING HR STRESS: 74 {beats}/min
LV dias vol: 81 mL (ref 62–150)
LV sys vol: 37 mL
NUC STRESS TID: 1.04
Peak HR: 98 {beats}/min
RATE: 0.33
SSS: 7

## 2017-06-21 MED ORDER — TECHNETIUM TC 99M TETROFOSMIN IV KIT
32.6000 | PACK | Freq: Once | INTRAVENOUS | Status: AC | PRN
  Administered 2017-06-21: 32.6 via INTRAVENOUS
  Filled 2017-06-21: qty 33

## 2017-06-21 MED ORDER — TECHNETIUM TC 99M TETROFOSMIN IV KIT
10.6000 | PACK | Freq: Once | INTRAVENOUS | Status: AC | PRN
  Administered 2017-06-21: 10.6 via INTRAVENOUS
  Filled 2017-06-21: qty 11

## 2017-06-21 MED ORDER — REGADENOSON 0.4 MG/5ML IV SOLN
0.4000 mg | Freq: Once | INTRAVENOUS | Status: AC
  Administered 2017-06-21: 0.4 mg via INTRAVENOUS

## 2017-06-26 ENCOUNTER — Encounter (HOSPITAL_COMMUNITY): Payer: Self-pay

## 2017-06-26 NOTE — Progress Notes (Signed)
Spoke with [facility].  I specifically asked about taking Pletal the DOS.  Personnel said that patient can take the aspirin, but take the pletal up until the day off. Also, she said his 1/23 stress was ok per Dr. Burt Knack.

## 2017-06-26 NOTE — Pre-Procedure Instructions (Signed)
Devin [name]  06/26/2017      [facility] - [place], [place] - [address] [place] [address] Phone: [phone] Fax: [phone]    Your procedure is scheduled on Monday, Feb. 4th   Report to [facility] Admitting at  5:30 AM             (posted surgery time 7:30a - 11:04a)   Call this number if you have problems the morning of surgery:  [phone]   Remember:              4-5 days prior to surgery, STOP TAKING any Vitamins, Herbal Supplements, Anti-inflammatories.   Do not eat food or drink liquids after midnight, Sunday.   Take these medicines the morning of surgery with A SIP OF WATER : Amlodipine, Omeprazole, Aspirin.   Do not wear jewelry - no rings or watches.  Do not wear lotions, colognes or deodorant.             Men may shave face and neck.   Do not bring valuables to the hospital.  [facility] is not responsible for any belongings or valuables.  Contacts, dentures or bridgework may not be worn into surgery.  Leave your suitcase in the car.  After surgery it may be brought to your room.  For patients admitted to the hospital, discharge time will be determined by your treatment team.  Please read over the following fact sheets that you were given. Pain Booklet, MRSA Information and Surgical Site Infection Prevention     [place]- Preparing For Surgery  Before surgery, you can play an important role. Because skin is not sterile, your skin needs to be as free of germs as possible. You can reduce the number of germs on your skin by washing with CHG (chlorahexidine gluconate) Soap before surgery.  CHG is an antiseptic cleaner which kills germs and bonds with the skin to continue killing germs even after washing.  Please do not use if you have an allergy to CHG or antibacterial soaps. If your skin becomes reddened/irritated stop using the CHG.  Do not shave (including legs and underarms) for at least 48 hours  prior to first CHG shower. It is OK to shave your face.  Please follow these instructions carefully.   1. Shower the NIGHT BEFORE SURGERY and the MORNING OF SURGERY with CHG.   2. If you chose to wash your hair, wash your hair first as usual with your normal shampoo.  3. After you shampoo, rinse your hair and body thoroughly to remove the shampoo.  4. Use CHG as you would any other liquid soap. You can apply CHG directly to the skin and wash gently with a scrungie or a clean washcloth.   5. Apply the CHG Soap to your body ONLY FROM THE NECK DOWN.  Do not use on open wounds or open sores. Avoid contact with your eyes, ears, mouth and genitals (private parts). Wash Face and genitals (private parts)  with your normal soap.  6. Wash thoroughly, paying special attention to the area where your surgery will be performed.  7. Thoroughly rinse your body with warm water from the neck down.  8. DO NOT shower/wash with your normal soap after using and rinsing off the CHG Soap.  9. Pat yourself dry with a CLEAN TOWEL.  10. Wear CLEAN PAJAMAS to bed the night before surgery, wear comfortable clothes the morning of surgery  11. Place CLEAN SHEETS on your bed the night of your first shower and DO NOT SLEEP WITH PETS.    Day of Surgery: Do not apply any deodorants/lotions. Please wear clean clothes to the hospital/surgery center.

## 2017-06-27 ENCOUNTER — Other Ambulatory Visit: Payer: Self-pay

## 2017-06-27 ENCOUNTER — Encounter (HOSPITAL_COMMUNITY): Payer: Self-pay

## 2017-06-27 ENCOUNTER — Encounter (HOSPITAL_COMMUNITY)
Admission: RE | Admit: 2017-06-27 | Discharge: 2017-06-27 | Disposition: A | Discharge status: Home / Self Care | Payer: Medicare Other | Source: Ambulatory Visit | Attending: Vascular Surgery | Admitting: Vascular Surgery

## 2017-06-27 DIAGNOSIS — Z01812 Encounter for preprocedural laboratory examination: Secondary | ICD-10-CM | POA: Diagnosis not present

## 2017-06-27 HISTORY — DX: Essential (primary) hypertension: I10

## 2017-06-27 HISTORY — DX: Gastro-esophageal reflux disease without esophagitis: K21.9

## 2017-06-27 HISTORY — DX: Personal history of urinary calculi: Z87.442

## 2017-06-27 HISTORY — DX: Cerebral infarction, unspecified: I63.9

## 2017-06-27 HISTORY — DX: Atherosclerotic heart disease of native coronary artery without angina pectoris: I25.10

## 2017-06-27 LAB — COMPREHENSIVE METABOLIC PANEL
ALK PHOS: 131 U/L — AB (ref 38–126)
ALT: 23 U/L (ref 17–63)
AST: 24 U/L (ref 15–41)
Albumin: 3.9 g/dL (ref 3.5–5.0)
Anion gap: 11 (ref 5–15)
BILIRUBIN TOTAL: 0.4 mg/dL (ref 0.3–1.2)
BUN: 8 mg/dL (ref 6–20)
CALCIUM: 9.3 mg/dL (ref 8.9–10.3)
CHLORIDE: 105 mmol/L (ref 101–111)
CO2: 24 mmol/L (ref 22–32)
CREATININE: 0.99 mg/dL (ref 0.61–1.24)
Glucose, Bld: 79 mg/dL (ref 65–99)
Potassium: 4.7 mmol/L (ref 3.5–5.1)
Sodium: 140 mmol/L (ref 135–145)
Total Protein: 7 g/dL (ref 6.5–8.1)

## 2017-06-27 LAB — PROTIME-INR
INR: 0.97
Prothrombin Time: 12.8 seconds (ref 11.4–15.2)

## 2017-06-27 LAB — CBC
HEMATOCRIT: 45.4 % (ref 39.0–52.0)
HEMOGLOBIN: 14.8 g/dL (ref 13.0–17.0)
MCH: 32.2 pg (ref 26.0–34.0)
MCHC: 32.6 g/dL (ref 30.0–36.0)
MCV: 98.7 fL (ref 78.0–100.0)
Platelets: 192 10*3/uL (ref 150–400)
RBC: 4.6 MIL/uL (ref 4.22–5.81)
RDW: 13.4 % (ref 11.5–15.5)
WBC: 6.7 10*3/uL (ref 4.0–10.5)

## 2017-06-27 LAB — URINALYSIS, ROUTINE W REFLEX MICROSCOPIC
Bilirubin Urine: NEGATIVE
GLUCOSE, UA: NEGATIVE mg/dL
Hgb urine dipstick: NEGATIVE
KETONES UR: NEGATIVE mg/dL
LEUKOCYTES UA: NEGATIVE
NITRITE: NEGATIVE
Protein, ur: NEGATIVE mg/dL
Specific Gravity, Urine: 1.025 (ref 1.005–1.030)
pH: 5 (ref 5.0–8.0)

## 2017-06-27 LAB — SURGICAL PCR SCREEN

## 2017-06-27 LAB — ABO/RH: ABO/RH(D): O POS

## 2017-06-27 LAB — TYPE AND SCREEN
ABO/RH(D): O POS
Antibody Screen: NEGATIVE

## 2017-06-27 LAB — APTT: aPTT: 40 seconds — ABNORMAL HIGH (ref 24–36)

## 2017-06-27 NOTE — Progress Notes (Signed)
PCP - Dr. [name]  Cardiologist - Dr. Burt Knack  Chest x-ray - Denies  EKG - 05/02/17 (E)  Stress Test - 06/21/17 (E)  ECHO - Denies  Cardiac Cath - Yes- 10-15 yrs ago followed by a CABG  Sleep Study - No CPAP - None  LABS- 06/27/17: CBC, CMP, PT, PTT, T/S, UA   Anesthesia- No  Pt denies having chest pain, sob, or fever at this time. All instructions explained to the pt, with a verbal understanding of the material. Pt agrees to go over the instructions while at home for a better understanding. The opportunity to ask questions was provided.

## 2017-06-30 LAB — MRSA CULTURE: CULTURE: NOT DETECTED

## 2017-07-02 NOTE — Anesthesia Preprocedure Evaluation (Addendum)
Anesthesia Evaluation  Patient identified by MRN, date of birth, ID band Patient awake    Reviewed: Allergy & Precautions, NPO status , Patient's Chart, lab work & pertinent test results  History of Anesthesia Complications Negative for: history of anesthetic complications  Airway Mallampati: I  TM Distance: >3 FB Neck ROM: Full    Dental  (+) Edentulous Upper, Edentulous Lower, Dental Advisory Given   Pulmonary asthma , former smoker,     + wheezing      Cardiovascular hypertension, + CAD and + Peripheral Vascular Disease  Normal cardiovascular exam  Study Highlights     Nuclear stress EF: 55%.  Blood pressure demonstrated a normal response to exercise.  There was no ST segment deviation noted during stress.  The study is normal.  This is a low risk study.     Neuro/Psych TIACVA, No Residual Symptoms negative psych ROS   GI/Hepatic Neg liver ROS, GERD  ,  Endo/Other  negative endocrine ROS  Renal/GU negative Renal ROS     Musculoskeletal negative musculoskeletal ROS (+)   Abdominal   Peds  Hematology negative hematology ROS (+)   Anesthesia Other Findings Day of surgery medications reviewed with the patient.  Reproductive/Obstetrics                            Anesthesia Physical Anesthesia Plan  ASA: III  Anesthesia Plan: General   Post-op Pain Management:    Induction: Intravenous  PONV Risk Score and Plan: 2 and Ondansetron and Dexamethasone  Airway Management Planned: Oral ETT  Additional Equipment:   Intra-op Plan:   Post-operative Plan: Extubation in OR  Informed Consent: I have reviewed the patients History and Physical, chart, labs and discussed the procedure including the risks, benefits and alternatives for the proposed anesthesia with the patient or authorized representative who has indicated his/her understanding and acceptance.   Dental advisory  given  Plan Discussed with: CRNA and Anesthesiologist  Anesthesia Plan Comments:         Anesthesia Quick Evaluation

## 2017-07-03 ENCOUNTER — Inpatient Hospital Stay (HOSPITAL_COMMUNITY): Payer: Medicare Other | Admitting: Anesthesiology

## 2017-07-03 ENCOUNTER — Other Ambulatory Visit: Payer: Self-pay

## 2017-07-03 ENCOUNTER — Encounter (HOSPITAL_COMMUNITY): Payer: Self-pay | Admitting: *Deleted

## 2017-07-03 ENCOUNTER — Encounter (HOSPITAL_COMMUNITY)
Admission: RE | Disposition: A | Discharge status: Home Health | Payer: Self-pay | Source: Ambulatory Visit | Attending: Vascular Surgery

## 2017-07-03 ENCOUNTER — Inpatient Hospital Stay (HOSPITAL_COMMUNITY)
Admission: RE | Admit: 2017-07-03 | Discharge: 2017-07-05 | DRG: 254 | Disposition: A | Discharge status: Home Health | Payer: Medicare Other | Source: Ambulatory Visit | Attending: Vascular Surgery | Admitting: Vascular Surgery

## 2017-07-03 DIAGNOSIS — Z79899 Other long term (current) drug therapy: Secondary | ICD-10-CM

## 2017-07-03 DIAGNOSIS — F17210 Nicotine dependence, cigarettes, uncomplicated: Secondary | ICD-10-CM | POA: Diagnosis present

## 2017-07-03 DIAGNOSIS — E785 Hyperlipidemia, unspecified: Secondary | ICD-10-CM | POA: Diagnosis present

## 2017-07-03 DIAGNOSIS — I739 Peripheral vascular disease, unspecified: Secondary | ICD-10-CM | POA: Diagnosis present

## 2017-07-03 DIAGNOSIS — Z8673 Personal history of transient ischemic attack (TIA), and cerebral infarction without residual deficits: Secondary | ICD-10-CM | POA: Diagnosis not present

## 2017-07-03 DIAGNOSIS — Z7982 Long term (current) use of aspirin: Secondary | ICD-10-CM

## 2017-07-03 DIAGNOSIS — J449 Chronic obstructive pulmonary disease, unspecified: Secondary | ICD-10-CM | POA: Diagnosis present

## 2017-07-03 DIAGNOSIS — Z886 Allergy status to analgesic agent status: Secondary | ICD-10-CM

## 2017-07-03 DIAGNOSIS — Z716 Tobacco abuse counseling: Secondary | ICD-10-CM | POA: Diagnosis not present

## 2017-07-03 DIAGNOSIS — I10 Essential (primary) hypertension: Secondary | ICD-10-CM | POA: Diagnosis present

## 2017-07-03 DIAGNOSIS — I70221 Atherosclerosis of native arteries of extremities with rest pain, right leg: Secondary | ICD-10-CM

## 2017-07-03 DIAGNOSIS — H9190 Unspecified hearing loss, unspecified ear: Secondary | ICD-10-CM | POA: Diagnosis present

## 2017-07-03 DIAGNOSIS — I70223 Atherosclerosis of native arteries of extremities with rest pain, bilateral legs: Secondary | ICD-10-CM | POA: Diagnosis present

## 2017-07-03 DIAGNOSIS — Z9889 Other specified postprocedural states: Secondary | ICD-10-CM | POA: Diagnosis not present

## 2017-07-03 DIAGNOSIS — Z951 Presence of aortocoronary bypass graft: Secondary | ICD-10-CM

## 2017-07-03 DIAGNOSIS — K219 Gastro-esophageal reflux disease without esophagitis: Secondary | ICD-10-CM | POA: Diagnosis present

## 2017-07-03 DIAGNOSIS — I2510 Atherosclerotic heart disease of native coronary artery without angina pectoris: Secondary | ICD-10-CM | POA: Diagnosis present

## 2017-07-03 HISTORY — PX: FEMORAL-POPLITEAL BYPASS GRAFT: SHX937

## 2017-07-03 HISTORY — PX: PATCH ANGIOPLASTY: SHX6230

## 2017-07-03 HISTORY — PX: ENDARTERECTOMY FEMORAL: SHX5804

## 2017-07-03 LAB — BLOOD GAS, ARTERIAL
Acid-base deficit: 1.8 mmol/L (ref 0.0–2.0)
BICARBONATE: 22.8 mmol/L (ref 20.0–28.0)
DRAWN BY: 519031
O2 Content: 3 L/min
O2 Saturation: 89.5 %
PATIENT TEMPERATURE: 98.6
pCO2 arterial: 41.5 mmHg (ref 32.0–48.0)
pH, Arterial: 7.36 (ref 7.350–7.450)
pO2, Arterial: 59.7 mmHg — ABNORMAL LOW (ref 83.0–108.0)

## 2017-07-03 LAB — SURGICAL PCR SCREEN
MRSA, PCR: NEGATIVE
STAPHYLOCOCCUS AUREUS: NEGATIVE

## 2017-07-03 SURGERY — BYPASS GRAFT FEMORAL-POPLITEAL ARTERY
Anesthesia: General | Laterality: Right

## 2017-07-03 MED ORDER — ATORVASTATIN CALCIUM 40 MG PO TABS
40.0000 mg | ORAL_TABLET | Freq: Every evening | ORAL | Status: DC
  Administered 2017-07-03 – 2017-07-04 (×2): 40 mg via ORAL
  Filled 2017-07-03 (×2): qty 1

## 2017-07-03 MED ORDER — ALBUTEROL SULFATE (2.5 MG/3ML) 0.083% IN NEBU
2.5000 mg | INHALATION_SOLUTION | RESPIRATORY_TRACT | Status: AC
  Administered 2017-07-03: 2.5 mg via RESPIRATORY_TRACT
  Filled 2017-07-03: qty 3

## 2017-07-03 MED ORDER — CHLORHEXIDINE GLUCONATE CLOTH 2 % EX PADS: 6.0000 | MEDICATED_PAD | Freq: Once | CUTANEOUS | Status: DC

## 2017-07-03 MED ORDER — ALBUMIN HUMAN 5 % IV SOLN
INTRAVENOUS | Status: DC | PRN
  Administered 2017-07-03: 10:00:00 via INTRAVENOUS

## 2017-07-03 MED ORDER — DEXTROSE-NACL 5-0.45 % IV SOLN: INTRAVENOUS | Status: DC

## 2017-07-03 MED ORDER — SODIUM CHLORIDE 0.9 % IV SOLN
INTRAVENOUS | Status: DC | PRN
  Administered 2017-07-03: 08:00:00 500 mL

## 2017-07-03 MED ORDER — LATANOPROST 0.005 % OP SOLN
1.0000 [drp] | Freq: Every day | OPHTHALMIC | Status: DC
  Administered 2017-07-03 – 2017-07-04 (×2): 1 [drp] via OPHTHALMIC
  Filled 2017-07-03: qty 2.5

## 2017-07-03 MED ORDER — PHENYLEPHRINE 40 MCG/ML (10ML) SYRINGE FOR IV PUSH (FOR BLOOD PRESSURE SUPPORT)
PREFILLED_SYRINGE | INTRAVENOUS | Status: AC
Start: 2017-07-03 — End: 2017-07-03
  Filled 2017-07-03: qty 10

## 2017-07-03 MED ORDER — ROCURONIUM BROMIDE 10 MG/ML (PF) SYRINGE
PREFILLED_SYRINGE | INTRAVENOUS | Status: AC
  Filled 2017-07-03: qty 5

## 2017-07-03 MED ORDER — MUPIROCIN 2 % EX OINT
TOPICAL_OINTMENT | CUTANEOUS | Status: AC
  Administered 2017-07-03: 1 via TOPICAL
  Filled 2017-07-03: qty 22

## 2017-07-03 MED ORDER — HYDRALAZINE HCL 20 MG/ML IJ SOLN: 5.0000 mg | INTRAMUSCULAR | Status: DC | PRN

## 2017-07-03 MED ORDER — PANTOPRAZOLE SODIUM 40 MG PO TBEC
40.0000 mg | DELAYED_RELEASE_TABLET | Freq: Every day | ORAL | Status: DC
  Administered 2017-07-03 – 2017-07-05 (×3): 40 mg via ORAL
  Filled 2017-07-03 (×3): qty 1

## 2017-07-03 MED ORDER — PROTAMINE SULFATE 10 MG/ML IV SOLN
INTRAVENOUS | Status: AC
  Filled 2017-07-03: qty 5

## 2017-07-03 MED ORDER — ALBUTEROL SULFATE HFA 108 (90 BASE) MCG/ACT IN AERS
INHALATION_SPRAY | RESPIRATORY_TRACT | Status: AC
  Filled 2017-07-03: qty 6.7

## 2017-07-03 MED ORDER — PHENOL 1.4 % MT LIQD: 1.0000 | OROMUCOSAL | Status: DC | PRN

## 2017-07-03 MED ORDER — SUGAMMADEX SODIUM 200 MG/2ML IV SOLN
INTRAVENOUS | Status: DC | PRN
  Administered 2017-07-03: 150 mg via INTRAVENOUS

## 2017-07-03 MED ORDER — HEMOSTATIC AGENTS (NO CHARGE) OPTIME
TOPICAL | Status: DC | PRN
  Administered 2017-07-03: 2 via TOPICAL

## 2017-07-03 MED ORDER — HEPARIN SODIUM (PORCINE) 1000 UNIT/ML IJ SOLN
INTRAMUSCULAR | Status: DC | PRN
  Administered 2017-07-03: 8000 [IU] via INTRAVENOUS
  Administered 2017-07-03: 5000 [IU] via INTRAVENOUS
  Administered 2017-07-03: 8000 [IU] via INTRAVENOUS

## 2017-07-03 MED ORDER — PHENYLEPHRINE HCL 10 MG/ML IJ SOLN
INTRAVENOUS | Status: DC | PRN
  Administered 2017-07-03: 20 ug/min via INTRAVENOUS
  Administered 2017-07-03 (×2): via INTRAVENOUS

## 2017-07-03 MED ORDER — OXYCODONE HCL 5 MG PO TABS
5.0000 mg | ORAL_TABLET | ORAL | Status: DC | PRN
  Administered 2017-07-04 (×2): 5 mg via ORAL
  Filled 2017-07-03 (×2): qty 1

## 2017-07-03 MED ORDER — SUGAMMADEX SODIUM 200 MG/2ML IV SOLN
INTRAVENOUS | Status: AC
  Filled 2017-07-03: qty 2

## 2017-07-03 MED ORDER — ESMOLOL HCL 100 MG/10ML IV SOLN
INTRAVENOUS | Status: AC
  Filled 2017-07-03: qty 10

## 2017-07-03 MED ORDER — NITROGLYCERIN 0.4 MG SL SUBL: 0.4000 mg | SUBLINGUAL_TABLET | SUBLINGUAL | Status: DC | PRN

## 2017-07-03 MED ORDER — HEPARIN SODIUM (PORCINE) 1000 UNIT/ML IJ SOLN
INTRAMUSCULAR | Status: AC
  Filled 2017-07-03: qty 1

## 2017-07-03 MED ORDER — LACTATED RINGERS IV SOLN
INTRAVENOUS | Status: DC | PRN
  Administered 2017-07-03 (×3): via INTRAVENOUS

## 2017-07-03 MED ORDER — GUAIFENESIN-DM 100-10 MG/5ML PO SYRP
15.0000 mL | ORAL_SOLUTION | ORAL | Status: DC | PRN
  Administered 2017-07-04: 15 mL via ORAL
  Filled 2017-07-03: qty 15

## 2017-07-03 MED ORDER — AMLODIPINE BESYLATE 5 MG PO TABS
5.0000 mg | ORAL_TABLET | Freq: Every day | ORAL | Status: DC
  Administered 2017-07-04 – 2017-07-05 (×2): 5 mg via ORAL
  Filled 2017-07-03 (×2): qty 1

## 2017-07-03 MED ORDER — METOPROLOL TARTRATE 5 MG/5ML IV SOLN: 2.0000 mg | INTRAVENOUS | Status: DC | PRN

## 2017-07-03 MED ORDER — OMEPRAZOLE MAGNESIUM 20 MG PO TBEC: 20.0000 mg | DELAYED_RELEASE_TABLET | Freq: Every day | ORAL | Status: DC

## 2017-07-03 MED ORDER — FENTANYL CITRATE (PF) 250 MCG/5ML IJ SOLN
INTRAMUSCULAR | Status: AC
  Filled 2017-07-03: qty 5

## 2017-07-03 MED ORDER — CHOLECALCIFEROL 125 MCG (5000 UT) PO TABS: 5000.0000 [IU] | ORAL_TABLET | Freq: Every day | ORAL | Status: DC

## 2017-07-03 MED ORDER — LABETALOL HCL 5 MG/ML IV SOLN: 10.0000 mg | INTRAVENOUS | Status: DC | PRN

## 2017-07-03 MED ORDER — MUPIROCIN 2 % EX OINT
1.0000 "application " | TOPICAL_OINTMENT | Freq: Once | CUTANEOUS | Status: AC
  Administered 2017-07-03: 1 via TOPICAL

## 2017-07-03 MED ORDER — DEXTROSE 5 % IV SOLN
1.5000 g | INTRAVENOUS | Status: AC
  Administered 2017-07-03: 1.5 g via INTRAVENOUS
  Filled 2017-07-03: qty 1.5

## 2017-07-03 MED ORDER — ALBUTEROL SULFATE (2.5 MG/3ML) 0.083% IN NEBU
3.0000 mL | INHALATION_SOLUTION | Freq: Four times a day (QID) | RESPIRATORY_TRACT | Status: DC | PRN
  Administered 2017-07-05: 3 mL via RESPIRATORY_TRACT
  Filled 2017-07-03: qty 3

## 2017-07-03 MED ORDER — LIDOCAINE 2% (20 MG/ML) 5 ML SYRINGE
INTRAMUSCULAR | Status: AC
  Filled 2017-07-03: qty 5

## 2017-07-03 MED ORDER — ROCURONIUM BROMIDE 10 MG/ML (PF) SYRINGE
PREFILLED_SYRINGE | INTRAVENOUS | Status: DC | PRN
  Administered 2017-07-03: 10 mg via INTRAVENOUS
  Administered 2017-07-03: 50 mg via INTRAVENOUS
  Administered 2017-07-03: 20 mg via INTRAVENOUS
  Administered 2017-07-03 (×2): 10 mg via INTRAVENOUS

## 2017-07-03 MED ORDER — PROTAMINE SULFATE 10 MG/ML IV SOLN
INTRAVENOUS | Status: DC | PRN
  Administered 2017-07-03: 20 mg via INTRAVENOUS
  Administered 2017-07-03 (×3): 10 mg via INTRAVENOUS

## 2017-07-03 MED ORDER — SODIUM CHLORIDE 0.9 % IV SOLN: 500.0000 mL | Freq: Once | INTRAVENOUS | Status: DC | PRN

## 2017-07-03 MED ORDER — SODIUM CHLORIDE 0.9 % IV SOLN: INTRAVENOUS | Status: DC

## 2017-07-03 MED ORDER — ACETAMINOPHEN 650 MG RE SUPP: 325.0000 mg | RECTAL | Status: DC | PRN

## 2017-07-03 MED ORDER — ZINC GLUCONATE 50 MG PO TABS: 50.0000 mg | ORAL_TABLET | Freq: Every day | ORAL | Status: DC

## 2017-07-03 MED ORDER — PROPOFOL 10 MG/ML IV BOLUS
INTRAVENOUS | Status: AC
  Filled 2017-07-03: qty 20

## 2017-07-03 MED ORDER — ALBUTEROL SULFATE (2.5 MG/3ML) 0.083% IN NEBU
INHALATION_SOLUTION | RESPIRATORY_TRACT | Status: AC
  Filled 2017-07-03: qty 3

## 2017-07-03 MED ORDER — 0.9 % SODIUM CHLORIDE (POUR BTL) OPTIME
TOPICAL | Status: DC | PRN
Start: 2017-07-03 — End: 2017-07-03
  Administered 2017-07-03: 2000 mL

## 2017-07-03 MED ORDER — PROPOFOL 10 MG/ML IV BOLUS
INTRAVENOUS | Status: DC | PRN
  Administered 2017-07-03: 20 mg via INTRAVENOUS
  Administered 2017-07-03: 100 mg via INTRAVENOUS

## 2017-07-03 MED ORDER — ONDANSETRON HCL 4 MG/2ML IJ SOLN: 4.0000 mg | Freq: Four times a day (QID) | INTRAMUSCULAR | Status: DC | PRN

## 2017-07-03 MED ORDER — ASPIRIN EC 81 MG PO TBEC
81.0000 mg | DELAYED_RELEASE_TABLET | Freq: Every day | ORAL | Status: DC
  Administered 2017-07-04 – 2017-07-05 (×2): 81 mg via ORAL
  Filled 2017-07-03 (×2): qty 1

## 2017-07-03 MED ORDER — PHENYLEPHRINE 40 MCG/ML (10ML) SYRINGE FOR IV PUSH (FOR BLOOD PRESSURE SUPPORT)
PREFILLED_SYRINGE | INTRAVENOUS | Status: DC | PRN
  Administered 2017-07-03: 120 ug via INTRAVENOUS
  Administered 2017-07-03: 40 ug via INTRAVENOUS
  Administered 2017-07-03 (×3): 80 ug via INTRAVENOUS

## 2017-07-03 MED ORDER — CEFUROXIME SODIUM 1.5 G IV SOLR
1.5000 g | INTRAVENOUS | Status: AC
  Administered 2017-07-03: 1.5 g via INTRAVENOUS
  Filled 2017-07-03: qty 1.5

## 2017-07-03 MED ORDER — ONDANSETRON HCL 4 MG/2ML IJ SOLN
INTRAMUSCULAR | Status: DC | PRN
  Administered 2017-07-03: 4 mg via INTRAVENOUS

## 2017-07-03 MED ORDER — DOCUSATE SODIUM 100 MG PO CAPS
100.0000 mg | ORAL_CAPSULE | Freq: Every day | ORAL | Status: DC
  Administered 2017-07-04 – 2017-07-05 (×2): 100 mg via ORAL
  Filled 2017-07-03 (×2): qty 1

## 2017-07-03 MED ORDER — HYDROMORPHONE HCL 1 MG/ML IJ SOLN
0.2500 mg | INTRAMUSCULAR | Status: DC | PRN
  Administered 2017-07-03: 0.5 mg via INTRAVENOUS

## 2017-07-03 MED ORDER — CILOSTAZOL 100 MG PO TABS
100.0000 mg | ORAL_TABLET | Freq: Two times a day (BID) | ORAL | Status: DC
  Administered 2017-07-03 – 2017-07-05 (×4): 100 mg via ORAL
  Filled 2017-07-03 (×4): qty 1

## 2017-07-03 MED ORDER — ACETAMINOPHEN 325 MG PO TABS
325.0000 mg | ORAL_TABLET | ORAL | Status: DC | PRN
  Administered 2017-07-04: 650 mg via ORAL
  Filled 2017-07-03: qty 2

## 2017-07-03 MED ORDER — MAGNESIUM SULFATE 2 GM/50ML IV SOLN: 2.0000 g | Freq: Every day | INTRAVENOUS | Status: DC | PRN

## 2017-07-03 MED ORDER — DORZOLAMIDE HCL-TIMOLOL MAL 2-0.5 % OP SOLN
1.0000 [drp] | Freq: Two times a day (BID) | OPHTHALMIC | Status: DC
  Administered 2017-07-03 – 2017-07-05 (×4): 1 [drp] via OPHTHALMIC
  Filled 2017-07-03: qty 10

## 2017-07-03 MED ORDER — MORPHINE SULFATE (PF) 2 MG/ML IV SOLN
2.0000 mg | INTRAVENOUS | Status: DC | PRN
  Administered 2017-07-03: 2 mg via INTRAVENOUS
  Filled 2017-07-03: qty 1

## 2017-07-03 MED ORDER — VITAMIN B-12 1000 MCG PO TABS
1000.0000 ug | ORAL_TABLET | Freq: Every day | ORAL | Status: DC
  Administered 2017-07-04 – 2017-07-05 (×2): 1000 ug via ORAL
  Filled 2017-07-03 (×2): qty 1

## 2017-07-03 MED ORDER — POTASSIUM CHLORIDE CRYS ER 20 MEQ PO TBCR: 20.0000 meq | EXTENDED_RELEASE_TABLET | Freq: Every day | ORAL | Status: DC | PRN

## 2017-07-03 MED ORDER — DEXAMETHASONE SODIUM PHOSPHATE 10 MG/ML IJ SOLN
INTRAMUSCULAR | Status: DC | PRN
  Administered 2017-07-03: 10 mg via INTRAVENOUS

## 2017-07-03 MED ORDER — PROMETHAZINE HCL 25 MG/ML IJ SOLN: 6.2500 mg | INTRAMUSCULAR | Status: DC | PRN

## 2017-07-03 MED ORDER — ASPIRIN 81 MG PO TBEC: 81.0000 mg | DELAYED_RELEASE_TABLET | Freq: Every day | ORAL | Status: DC

## 2017-07-03 MED ORDER — ALBUTEROL SULFATE HFA 108 (90 BASE) MCG/ACT IN AERS
INHALATION_SPRAY | RESPIRATORY_TRACT | Status: DC | PRN
  Administered 2017-07-03: 6 via RESPIRATORY_TRACT

## 2017-07-03 MED ORDER — FENTANYL CITRATE (PF) 100 MCG/2ML IJ SOLN
INTRAMUSCULAR | Status: DC | PRN
  Administered 2017-07-03 (×2): 50 ug via INTRAVENOUS
  Administered 2017-07-03: 250 ug via INTRAVENOUS
  Administered 2017-07-03 (×2): 50 ug via INTRAVENOUS

## 2017-07-03 MED ORDER — HYDROMORPHONE HCL 1 MG/ML IJ SOLN
INTRAMUSCULAR | Status: AC
  Filled 2017-07-03: qty 1

## 2017-07-03 MED ORDER — LIDOCAINE 2% (20 MG/ML) 5 ML SYRINGE
INTRAMUSCULAR | Status: DC | PRN
  Administered 2017-07-03: 20 mg via INTRAVENOUS

## 2017-07-03 MED ORDER — ALUM & MAG HYDROXIDE-SIMETH 200-200-20 MG/5ML PO SUSP: 15.0000 mL | ORAL | Status: DC | PRN

## 2017-07-03 MED ORDER — DEXTROSE 5 % IV SOLN
1.5000 g | Freq: Two times a day (BID) | INTRAVENOUS | Status: AC
  Administered 2017-07-03 – 2017-07-04 (×2): 1.5 g via INTRAVENOUS
  Filled 2017-07-03 (×2): qty 1.5

## 2017-07-03 MED ORDER — VITAMIN D 1000 UNITS PO TABS
5000.0000 [IU] | ORAL_TABLET | Freq: Every day | ORAL | Status: DC
  Administered 2017-07-04 – 2017-07-05 (×2): 5000 [IU] via ORAL
  Filled 2017-07-03 (×3): qty 5

## 2017-07-03 MED ORDER — ONDANSETRON HCL 4 MG/2ML IJ SOLN
INTRAMUSCULAR | Status: AC
  Filled 2017-07-03: qty 2

## 2017-07-03 MED ORDER — ESMOLOL HCL 100 MG/10ML IV SOLN
INTRAVENOUS | Status: DC | PRN
  Administered 2017-07-03: 30 mg via INTRAVENOUS

## 2017-07-03 SURGICAL SUPPLY — 63 items
BANDAGE ESMARK 6X9 LF (GAUZE/BANDAGES/DRESSINGS) IMPLANT
BLADE SURG 11 STRL SS (BLADE) ×2 IMPLANT
BNDG ESMARK 6X9 LF (GAUZE/BANDAGES/DRESSINGS)
CANISTER SUCT 3000ML PPV (MISCELLANEOUS) ×2 IMPLANT
CANNULA VESSEL 3MM 2 BLNT TIP (CANNULA) ×4 IMPLANT
CLIP VESOCCLUDE MED 24/CT (CLIP) ×2 IMPLANT
CLIP VESOCCLUDE SM WIDE 24/CT (CLIP) ×2 IMPLANT
CUFF TOURNIQUET SINGLE 24IN (TOURNIQUET CUFF) IMPLANT
CUFF TOURNIQUET SINGLE 34IN LL (TOURNIQUET CUFF) IMPLANT
CUFF TOURNIQUET SINGLE 44IN (TOURNIQUET CUFF) IMPLANT
DERMABOND ADVANCED (GAUZE/BANDAGES/DRESSINGS) ×8
DERMABOND ADVANCED .7 DNX12 (GAUZE/BANDAGES/DRESSINGS) ×8 IMPLANT
DRAIN SNY WOU (WOUND CARE) IMPLANT
DRAPE HALF SHEET 40X57 (DRAPES) IMPLANT
DRAPE X-RAY CASS 24X20 (DRAPES) IMPLANT
ELECT REM PT RETURN 9FT ADLT (ELECTROSURGICAL) ×2
ELECTRODE REM PT RTRN 9FT ADLT (ELECTROSURGICAL) ×1 IMPLANT
EVACUATOR SILICONE 100CC (DRAIN) IMPLANT
GAUZE SPONGE 4X4 12PLY STRL LF (GAUZE/BANDAGES/DRESSINGS) IMPLANT
GAUZE SPONGE 4X4 16PLY XRAY LF (GAUZE/BANDAGES/DRESSINGS) ×4 IMPLANT
GLOVE BIO SURGEON STRL SZ 6.5 (GLOVE) ×8 IMPLANT
GLOVE BIO SURGEON STRL SZ7 (GLOVE) ×2 IMPLANT
GLOVE BIO SURGEON STRL SZ7.5 (GLOVE) ×2 IMPLANT
GLOVE BIOGEL PI IND STRL 6.5 (GLOVE) ×9 IMPLANT
GLOVE BIOGEL PI INDICATOR 6.5 (GLOVE) ×9
GLOVE ECLIPSE 6.5 STRL STRAW (GLOVE) ×4 IMPLANT
GLOVE SS BIOGEL STRL SZ 7.5 (GLOVE) ×1 IMPLANT
GLOVE SUPERSENSE BIOGEL SZ 7.5 (GLOVE) ×1
GOWN STRL REUS W/ TWL LRG LVL3 (GOWN DISPOSABLE) ×8 IMPLANT
GOWN STRL REUS W/ TWL XL LVL3 (GOWN DISPOSABLE) ×1 IMPLANT
GOWN STRL REUS W/TWL LRG LVL3 (GOWN DISPOSABLE) ×8
GOWN STRL REUS W/TWL XL LVL3 (GOWN DISPOSABLE) ×1
GRAFT PROPATEN THIN WALL 6X80 (Vascular Products) ×2 IMPLANT
HEMOSTAT SPONGE AVITENE ULTRA (HEMOSTASIS) IMPLANT
KIT BASIN OR (CUSTOM PROCEDURE TRAY) ×2 IMPLANT
KIT ROOM TURNOVER OR (KITS) ×2 IMPLANT
LOOP VESSEL MINI RED (MISCELLANEOUS) ×4 IMPLANT
NS IRRIG 1000ML POUR BTL (IV SOLUTION) ×4 IMPLANT
PACK PERIPHERAL VASCULAR (CUSTOM PROCEDURE TRAY) ×2 IMPLANT
PAD ARMBOARD 7.5X6 YLW CONV (MISCELLANEOUS) ×4 IMPLANT
SET COLLECT BLD 21X3/4 12 (NEEDLE) IMPLANT
SPONGE INTESTINAL PEANUT (DISPOSABLE) ×2 IMPLANT
SPONGE LAP 18X18 X RAY DECT (DISPOSABLE) ×2 IMPLANT
STOPCOCK 4 WAY LG BORE MALE ST (IV SETS) IMPLANT
SUT PROLENE 5 0 C 1 24 (SUTURE) ×4 IMPLANT
SUT PROLENE 6 0 CC (SUTURE) ×6 IMPLANT
SUT PROLENE 7 0 BV 1 (SUTURE) ×10 IMPLANT
SUT PROLENE 7 0 BV1 MDA (SUTURE) IMPLANT
SUT SILK 2 0 SH (SUTURE) ×2 IMPLANT
SUT SILK 3 0 (SUTURE) ×3
SUT SILK 3-0 18XBRD TIE 12 (SUTURE) ×3 IMPLANT
SUT VIC AB 2-0 SH 27 (SUTURE) ×1
SUT VIC AB 2-0 SH 27XBRD (SUTURE) ×1 IMPLANT
SUT VIC AB 3-0 SH 27 (SUTURE) ×7
SUT VIC AB 3-0 SH 27X BRD (SUTURE) ×7 IMPLANT
SUT VIC AB 4-0 PS2 27 (SUTURE) ×4 IMPLANT
SUT VICRYL 4-0 PS2 18IN ABS (SUTURE) ×6 IMPLANT
TAPE UMBILICAL COTTON 1/8X30 (MISCELLANEOUS) ×2 IMPLANT
TOWEL GREEN STERILE (TOWEL DISPOSABLE) ×2 IMPLANT
TRAY FOLEY MTR SLVR 16FR STAT (CATHETERS) ×2 IMPLANT
TUBING EXTENTION W/L.L. (IV SETS) IMPLANT
UNDERPAD 30X30 (UNDERPADS AND DIAPERS) ×2 IMPLANT
WATER STERILE IRR 1000ML POUR (IV SOLUTION) ×2 IMPLANT

## 2017-07-03 NOTE — Progress Notes (Signed)
Maureen PA made aware of abg results

## 2017-07-03 NOTE — Op Note (Signed)
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  The patient was placed in supine position operating table.  After induction of general anesthesia and endotracheal intubation, the patient's entire right lower extremity was prepped and draped in usual sterile fashion.  A longitudinal incision was made in patient's right groin carried down through the subtendinous tissues down level right common femoral artery.  Common femoral artery was heavily calcified.  It was dissected free circumferentially and several small side branches ligated and divided between silk ties.  I dissected the external iliac artery free circumferentially up underneath the inguinal ligament to reach a suitable clamp spot.  Dissection was carried down to the profunda femoris and superficial femoral arteries and these were also dissected free circumferentially and vessel loops placed around these.  The patient had had a previous saphenectomy in the right leg for coronary artery bypass grafting.  However, I was able to find a segment of remnant saphenous extending over about 7 cm which I harvested through an additional 2 longitudinal incisions in the thigh.  This was ligated with 2-0 silk ties proximally and distally and the intervening segment excised.  It was distended gently with heparinized saline and opened longitudinally to be used as a patch later for the distal anastomosis as well as for a patch  angioplasty for a common femoral endarterectomy.  Next attention was turned to the leg.  A medial incision was made through a pre-existing scar on the upper portion of the right calf.  The incision was carried through the subtendinous tissues down the level of the fascia.  This was incised for the full length the incision.  The popliteal fossa was entered.  The popliteal artery was identified after retracting the popliteal vein posteriorly.  The popliteal artery was heavily calcified.  It was slightly softer up towards the level of the knee joint.  The origins of the tibial vessels were also heavily calcified.  A tunnel was then created between the heads of the gastrocnemius muscle subsartorial up to the level of the groin.  At this point the patient was given 8000 units of intravenous heparin.  He was given an additional 5000 units and an additional 8000 unit bolus of heparin during the course of the case.  The right common femoral artery was controlled with a Cooley clamp.  The superficial femoral and profunda femoris arteries were controlled with Vesseloops.  Longitudinal opening was made in the right common femoral artery a pre-existing Star close device was debrided away.  The artery was heavily calcified with a narrowing in the midportion of the common femoral artery and a large plaque at the origin of the profunda as well.  I performed a endarterectomy extending from the common femoral artery all the way down into the origin of the profunda and the origins of the superficial femoral artery.  A good endpoint was obtained in the proximal profunda.  The superficial femoral artery was endarterectomized using eversion technique.  It was chronically occluded.  There was minimal backbleeding from this.  At this  point the vein patch was brought up in the operative field and sent on as a patch angioplasty using a running 6-0 Prolene suture.  Just prior to completion of the anastomosis it was forebled backbled and  thoroughly flushed.  Anastomosis was secured clamps released there is good pulsatile flow in the profunda immediately.  The artery was then re-controlled and a longitudinal opening made in the patch and the 6 mm propatent graft spatulated and sewn endograft to side of patch using a running 5-0 Prolene suture.  A completion anastomosis the graft was clamped just after its origin.  Flow was then restored back to the profunda.  The graft was then brought through the subsartorial tunnel down to the below-knee popliteal artery.  I opened the below-knee popliteal artery and this was heavily calcified and not really consolable.  I proceeded to start an endarterectomy to recover some semblance of a lumen.  The anterior tibial artery was completely occluded.  I endarterectomized this by eversion technique and got a good endpoint.  There was a trace amount of backbleeding from this.  I could not get a very good endpoint in the tibia peroneal trunk.  Therefore I dissected all the way down to the origins of the posterior tibial and peroneal arteries.  The soleus muscle was taken down as were several soleus vein branches ligated and divided between silk ties.  I extended the arteriotomy down to this level.  The artery was very friable calcified and essentially unreconstructable.  I did not feel like it endarterectomized this can have a reasonable lumen to sew to.  Therefore the tibial peroneal trunk was ligated just above the origins of the posterior tibial and peroneal arteries.  The origin of the anterior tibial artery was also ligated.  The popliteal artery was ligated just after the knee joint.  At this point I exposed the posterior tibial artery and the more distal calf about 8 cm above the ankle.  The artery was small but soft and Cal and character and seem to be a very good Sobel vessel.  It is about a millimeter and a half in diameter.  Graft was brought down under the skin bridge to the level of the artery.  The artery  was controlled proximally distally with fine bulldog clamps.  A longitudinal opening was made in the posterior tibial artery this had reasonable back bleeding and some antegrade flow.  The remainder of the vein patch I constructed earlier was used as a patch angioplasty on the posterior tibial artery using a running 7-0 Prolene suture.  A completion anastomosis the hood of the patch was opened longitudinally the graft was cut to length with the leg straight and spatulated and sewn endograft to side of vein patch using a running 7-0 Prolene suture.  Just prior to completion of the anastomosis it was forebled backbled and thoroughly flushed.  Anastomosis was secured clamps released there was good pulsatile flow in the posterior tibial artery immediately.  There was good Doppler flow.  Patient had a palpable posterior tibial pulse.  Hemostasis was then obtained with a few repair stitches at the distal anastomosis and one repair stitch at the proximal anastomosis.  Avitene was packed around both anastomoses.  The patient was given 50 mill grams of protamine.  At this point all the incisions were cleaned closed in multiple layers after removing all the Avitene.  This was done with multiple layers of running 3-0 Vicryl suture in the fascial and subcutaneous layers.  Skin of all incisions was then closed with a 4-0 Vicryl subcuticular stitch.  The patient tolerated procedure well and there were no complications.  The estimate sponge needle count was correct at the end of the case.  The patient was taken the recovery room in stable condition.  [name], MD [facility] [place] [facility] Office: [phone] Pager: [phone]

## 2017-07-03 NOTE — Transfer of Care (Signed)
Immediate Anesthesia Transfer of Care Note  Patient: Devin [name]  Procedure(s) Performed: BYPASS GRAFT FEMORAL-POPLITEAL ARTERY (Right ) ENDARTERECTOMY FEMORAL RIGHT (Right ) VEIN PATCH ANGIOPLASTY OF FEMORAL AND POSTERIOR TIBIAL ARTERIES (Right )  Patient Location: PACU  Anesthesia Type:General  Level of Consciousness: awake and alert   Airway & Oxygen Therapy: Patient Spontanous Breathing and Patient connected to face mask oxygen  Post-op Assessment: Report given to RN, Post -op Vital signs reviewed and stable and Patient moving all extremities X 4  Post vital signs: Reviewed and stable  Last Vitals:  Vitals:   07/03/17 0542 07/03/17 0544  BP:  (!) 156/78  Pulse: 70   Resp: 18   Temp: 36.4 C   SpO2: 96%     Last Pain:  Vitals:   07/03/17 0542  TempSrc: Oral      Patients Stated Pain Goal: 4 (37/94/32 7614)  Complications: No apparent anesthesia complications

## 2017-07-03 NOTE — Anesthesia Procedure Notes (Signed)

## 2017-07-03 NOTE — Interval H&P Note (Signed)
History and Physical Interval Note:  07/03/2017 7:23 AM  Devin [name]  has presented today for surgery, with the diagnosis of claudicatiion  The various methods of treatment have been discussed with the patient and family. After consideration of risks, benefits and other options for treatment, the patient has consented to  Procedure(s): BYPASS GRAFT FEMORAL-POPLITEAL ARTERY (Right) as a surgical intervention .  The patient's history has been reviewed, patient examined, no change in status, stable for surgery.  I have reviewed the patient's chart and labs.  Questions were answered to the patient's satisfaction.     [name]

## 2017-07-03 NOTE — Progress Notes (Signed)
Pt still very somnolent in PACU.  Desats easily 2+ right PT pulse  Will check ABG.  Probably has some CO2 retention/hypoxia To 4 E when bed available  [name], MD [facility] [place] Office: [phone] Pager: [phone]

## 2017-07-03 NOTE — Anesthesia Postprocedure Evaluation (Signed)
Anesthesia Post Note  Patient: Devin [name]  Procedure(s) Performed: BYPASS GRAFT FEMORAL-POPLITEAL ARTERY (Right ) ENDARTERECTOMY FEMORAL RIGHT (Right ) VEIN PATCH ANGIOPLASTY OF FEMORAL AND POSTERIOR TIBIAL ARTERIES (Right )     Patient location during evaluation: PACU Anesthesia Type: General Level of consciousness: sedated Pain management: pain level controlled Vital Signs Assessment: post-procedure vital signs reviewed and stable Respiratory status: spontaneous breathing and respiratory function stable Cardiovascular status: stable Postop Assessment: no apparent nausea or vomiting Anesthetic complications: no    Last Vitals:  Vitals:   07/03/17 1530 07/03/17 1545  BP:    Pulse: 95 91  Resp: 14 11  Temp:    SpO2: 94% 93%    Last Pain:  Vitals:   07/03/17 0542  TempSrc: Oral                 [name] DANIEL

## 2017-07-03 NOTE — Progress Notes (Signed)
    Patient alert and oriented to person  And place Right PT palpable pulse Incision soft and intact, groin soft without hematoma  3L [place] SAT 93 % Arterial blood gas PO2 59.7 PCO2 41.5  S/P Right femoral to posterior tibial artery bypass 6 mm propaten, right common femoral endarterectomy with vein patch, vein patch distal PT anastomosis  Will continue O2 3L via  keep SAT > 90 % Plan repeat arterial blood gas in the am Disposition stable waiting for transfer to 4E bed  Roxy Horseman PA-C

## 2017-07-04 ENCOUNTER — Other Ambulatory Visit: Payer: Self-pay

## 2017-07-04 ENCOUNTER — Inpatient Hospital Stay (HOSPITAL_COMMUNITY): Payer: Medicare Other

## 2017-07-04 ENCOUNTER — Encounter (HOSPITAL_COMMUNITY): Payer: Self-pay | Admitting: Vascular Surgery

## 2017-07-04 DIAGNOSIS — Z9889 Other specified postprocedural states: Secondary | ICD-10-CM

## 2017-07-04 LAB — BLOOD GAS, ARTERIAL
Acid-Base Excess: 1 mmol/L (ref 0.0–2.0)
BICARBONATE: 25.3 mmol/L (ref 20.0–28.0)
Drawn by: 44166
O2 CONTENT: 3 L/min
O2 SAT: 85.5 %
PCO2 ART: 41.5 mmHg (ref 32.0–48.0)
PH ART: 7.402 (ref 7.350–7.450)
Patient temperature: 98.6
pO2, Arterial: 52.1 mmHg — ABNORMAL LOW (ref 83.0–108.0)

## 2017-07-04 LAB — BASIC METABOLIC PANEL
ANION GAP: 10 (ref 5–15)
BUN: 11 mg/dL (ref 6–20)
CALCIUM: 8.1 mg/dL — AB (ref 8.9–10.3)
CO2: 21 mmol/L — ABNORMAL LOW (ref 22–32)
Chloride: 108 mmol/L (ref 101–111)
Creatinine, Ser: 1.04 mg/dL (ref 0.61–1.24)
GFR calc Af Amer: >60 mL/min (ref >60)
GLUCOSE: 109 mg/dL — AB (ref 65–99)
POTASSIUM: 4.3 mmol/L (ref 3.5–5.1)
SODIUM: 139 mmol/L (ref 135–145)

## 2017-07-04 LAB — CBC
HCT: 31.8 % — ABNORMAL LOW (ref 39.0–52.0)
Hemoglobin: 10.1 g/dL — ABNORMAL LOW (ref 13.0–17.0)
MCH: 31.5 pg (ref 26.0–34.0)
MCHC: 31.8 g/dL (ref 30.0–36.0)
MCV: 99.1 fL (ref 78.0–100.0)
PLATELETS: 127 10*3/uL — AB (ref 150–400)
RBC: 3.21 MIL/uL — AB (ref 4.22–5.81)
RDW: 13.2 % (ref 11.5–15.5)
WBC: 7.2 10*3/uL (ref 4.0–10.5)

## 2017-07-04 MED ORDER — APIXABAN 5 MG PO TABS: 5.0000 mg | ORAL_TABLET | Freq: Two times a day (BID) | ORAL | Status: DC

## 2017-07-04 MED ORDER — APIXABAN 5 MG PO TABS
5.0000 mg | ORAL_TABLET | Freq: Two times a day (BID) | ORAL | Status: DC
  Administered 2017-07-04 – 2017-07-05 (×2): 5 mg via ORAL
  Filled 2017-07-04 (×2): qty 1

## 2017-07-04 MED ORDER — HEPARIN SODIUM (PORCINE) 5000 UNIT/ML IJ SOLN
5000.0000 [IU] | Freq: Three times a day (TID) | INTRAMUSCULAR | Status: DC
  Administered 2017-07-04: 5000 [IU] via SUBCUTANEOUS
  Filled 2017-07-04: qty 1

## 2017-07-04 NOTE — Progress Notes (Signed)
Pt ambulated 465ft with rolling walker on O2 @ 3L, pt tolerated well, will continue to monitor.

## 2017-07-04 NOTE — Progress Notes (Addendum)
  Progress Note    07/04/2017 7:25 AM 1 Day Post-Op  Subjective:  Pain in R leg along incision line in groin; he believes R ankle ROM has improved compared to before surgery.   Vitals:   07/04/17 0318 07/04/17 0500  BP: 112/60   Pulse: 78 85  Resp: 16 19  Temp: 98.2 F (36.8 C)   SpO2: 91% 91%   Physical Exam: Cardiac:  RRR Lungs:  Non labored Incisions:  No palpable hematoma; skin edges approximated well; no drainage from any of RLE incisions Extremities:  Palpable R PT Abdomen:  Soft Neurologic: A&O  CBC    Component Value Date/Time   WBC 7.2 07/03/2017 2347   RBC 3.21 (L) 07/03/2017 2347   HGB 10.1 (L) 07/03/2017 2347   HGB 13.7 06/09/2017 0935   HCT 31.8 (L) 07/03/2017 2347   HCT 39.9 06/09/2017 0935   PLT 127 (L) 07/03/2017 2347   PLT 173 06/09/2017 0935   MCV 99.1 07/03/2017 2347   MCV 93 06/09/2017 0935   MCH 31.5 07/03/2017 2347   MCHC 31.8 07/03/2017 2347   RDW 13.2 07/03/2017 2347   RDW 14.2 06/09/2017 0935    BMET    Component Value Date/Time   NA 139 07/03/2017 2347   NA 145 (H) 06/09/2017 0935   K 4.3 07/03/2017 2347   CL 108 07/03/2017 2347   CO2 21 (L) 07/03/2017 2347   GLUCOSE 109 (H) 07/03/2017 2347   BUN 11 07/03/2017 2347   BUN 8 06/09/2017 0935   CREATININE 1.04 07/03/2017 2347   CREATININE 0.93 07/28/2015 1034   CALCIUM 8.1 (L) 07/03/2017 2347   GFRNONAA >60 07/03/2017 2347   GFRAA >60 07/03/2017 2347    INR    Component Value Date/Time   INR 0.97 06/27/2017 1521     Intake/Output Summary (Last 24 hours) at 07/04/2017 0725 Last data filed at 07/03/2017 2200 Gross per 24 hour  Intake 3220 ml  Output 1000 ml  Net 2220 ml     Assessment/Plan:  72 y.o. male is s/p R femoral to PTA bypass PTFE, R CFA endarterectomy, vein patch both anastomosis sites 1 Day Post-Op   Encouraged IS Encouraged OOB; PT/OT to evaluate and treat today Bypass remains patent given palpable R PT D/c when ambulating without difficulty and pain  control adequate over the next few days   DVT prophylaxis:  subq heparin    Dagoberto Ligas, PA-C [facility] [phone] 07/04/2017 7:25 AM  Agree with above.  Patent bypass.  Foot  Well perfused. Incisions healing. No hematoma Pt has a bypass graft that will be high risk for occlusion.   Will start Eliquis today to try to maintain graft patency.  He denies any history of GI bleeding or intracranial bleed He is currently on Pletal and should be able to continue this as bleeding risk is only slightly elevated. Risks of Eliquis discussed with pt primarily related to bleeding complications.  Benefit of graft patency discussed.  [name], MD [facility] [place] Office: [phone] Pager: [phone]

## 2017-07-04 NOTE — Progress Notes (Signed)
VASCULAR LAB PRELIMINARY  ARTERIAL  ABI completed:    RIGHT    LEFT    PRESSURE WAVEFORM  PRESSURE WAVEFORM  BRACHIAL 126 Tri BRACHIAL 126 Tri  DP 149 Mono DP 34 Audible not clearly visualized  PT 147 Mono PT 55 Mono    RIGHT LEFT  ABI 1.18 0.44   Right ABIs appear increased. Left ABIs appear decreased.   Final Interpretation: Right: Resting right ankle-brachial index is within normal range. No evidence of significant right lower extremity arterial disease.  Left: Resting left ankle-brachial index indicates severe left lower extremity arterial disease.    Everrett Coombe, RVT 07/04/2017, 12:40 PM

## 2017-07-04 NOTE — Progress Notes (Signed)
ANTICOAGULATION CONSULT NOTE - Initial Consult  Pharmacy Consult for Eliquis Indication: maintain graft patency    Allergies  Allergen Reactions  . Aspirin Other (See Comments)    Has to take the coated aspirin    Patient Measurements: Height: 5\' 4"  (162.6 cm) Weight: 156 lb 15.5 oz (71.2 kg) IBW/kg (Calculated) : 59.2  Vital Signs: Temp: 98.4 F (36.9 C) (02/05 1500) Temp Source: Oral (02/05 1500) BP: 96/57 (02/05 1500) Pulse Rate: 102 (02/05 1500)  Labs: Recent Labs    07/03/17 2347  HGB 10.1*  HCT 31.8*  PLT 127*  CREATININE 1.04    Estimated Creatinine Clearance: 59.8 mL/min (by C-G formula based on SCr of 1.04 mg/dL).   Medical History: Past Medical History:  Diagnosis Date  . Arthritis   . Asthma   . Carpal tunnel syndrome   . Chest pain, unspecified   . Chronic kidney disease    hx of kidney stones  . Coronary artery disease   . Coronary atherosclerosis of artery bypass graft   . GERD (gastroesophageal reflux disease)   . History of kidney stones   . HOH (hard of hearing)   . Hypertension   . 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 Other and unspecified hyperlipidemia   . Personal history of unspecified circulatory disease   . Shortness of breath   . Stroke ([place])   . TIA (transient ischemic attack)      Assessment: 87 YOM s/p R femoral to PTA bypass PTFE, R CFA endarterectomy, vein patch both anastomosis sites on 2/4 [facility] consulted pharmacy to start Eliquis to maintain leg bypass patency. I am not sure if Eliquis has sufficient data for this indication. Per COMPASS trial Xarelto 2.5 mg BID + low dose aspirin could be a reasonable regimen for this indication. I couldn't get hold [facility] this afternoon.  Hgb 10.1, pltc 127K, scr 1.04, also on aspirin 81mg  daily and pletal 100 mg BID.  I will start Eliquis 5mg  PO BID for now per dosing for afib and stroke prevention.   Goal of Therapy:  Monitor platelets  by anticoagulation protocol: Yes   Plan:  Start eliquis 5mg  PO BID for now Contact [facility] tomorrow to discuss eliquis vs. Low dose xarelto  Maryanna Shape, PharmD, BCPS  Clinical Pharmacist  Pager: [phone]   07/04/2017,3:34 PM

## 2017-07-04 NOTE — Progress Notes (Signed)
OT Cancellation    07/04/17 1230  OT Visit Information  Last OT Received On 07/04/17  Reason Eval/Treat Not Completed Patient at procedure or test/ unavailable (vascular)  [facility], OT/L  [phone] 07/04/2017

## 2017-07-04 NOTE — Evaluation (Signed)
Occupational Therapy Evaluation Patient Details Name: Devin [name] MRN: [id number] DOB: 1945-08-12 Today's Date: 07/04/2017    History of Present Illness  72 y.o. male is s/p R femoral to PTA bypass PTFE, R CFA endarterectomy, vein patch both anastomosis. PMH: CAD, PAD, CABG, back surgery, GERD, HOH, CVA, TIA.   Clinical Impression   PTA, pt was independent with ADL and functional mobility. He currently is limited by R LE pain but very motivated to return to PLOF. Pt currently requires mod assist for LB ADL and set-up for UB ADL as well as min guard assist for safety with toilet transfers. He would benefit from continued OT services while admitted to improve independence and safety with ADL and functional mobility. Initiated education concerning potential use of AE for LB dressing and bathing tasks and will continue education and demonstration next session. Feel pt will make good progress with OT in acute setting and do not anticipate need for OT follow-up post-acute D/C. Will continue to follow while admitted.     Follow Up Recommendations  No OT follow up;Supervision/Assistance - 24 hour    Equipment Recommendations  3 in 1 bedside commode    Recommendations for Other Services       Precautions / Restrictions Precautions Precautions: Other (comment) Precaution Comments:  watch O2 sats Restrictions Weight Bearing Restrictions: No      Mobility Bed Mobility               General bed mobility comments: sitting at EOB on my arrival  Transfers Overall transfer level: Needs assistance Equipment used: Rolling walker (2 wheeled) Transfers: Sit to/from Stand Sit to Stand: Min guard         General transfer comment: Min guard assist to power up and cues for safety with RW. Supervision once ambulating.     Balance Overall balance assessment: Needs assistance Sitting-balance support: No upper extremity supported Sitting balance-Leahy Scale: Good     Standing balance  support: No upper extremity supported;During functional activity;Bilateral upper extremity supported Standing balance-Leahy Scale: Fair Standing balance comment: Able to statically stand without UE support but relies on B UE support during functional mobility.                            ADL either performed or assessed with clinical judgement   ADL Overall ADL's : Needs assistance/impaired Eating/Feeding: Set up;Sitting   Grooming: Supervision/safety;Standing   Upper Body Bathing: Set up;Sitting   Lower Body Bathing: Moderate assistance;Sit to/from stand   Upper Body Dressing : Set up;Sitting   Lower Body Dressing: Moderate assistance;Sit to/from stand   Toilet Transfer: Ambulation;RW;BSC;Min Psychiatric nurse Details (indicate cue type and reason): Min guard for initial power up but supervision once ambulating.  Toileting- Water quality scientist and Hygiene: Min guard;Sit to/from stand       Functional mobility during ADLs: Min guard;Supervision/safety;Rolling walker General ADL Comments: Pt stable once on feet but limited in ability to power up. Initiated education concerning safe ADL participation and potential use of AE for LB ADL.      Vision Baseline Vision/History: Wears glasses Wears Glasses: Reading only Patient Visual Report: No change from baseline Additional Comments: Pt with laterally rotated L eye with disconjugate gaze. He reports no changes from baseline.      Perception     Praxis      Pertinent Vitals/Pain Pain Assessment: Faces Faces Pain Scale: Hurts little more Pain Location: R LE with ambulation  Pain Descriptors / Indicators: Throbbing;Aching Pain Intervention(s): Limited activity within patient's tolerance;Monitored during session;Repositioned     Hand Dominance     Extremity/Trunk Assessment Upper Extremity Assessment Upper Extremity Assessment: Overall WFL for tasks assessed   Lower Extremity Assessment Lower Extremity  Assessment: RLE deficits/detail RLE Deficits / Details: Swelling and redness noted in R lower leg and foot. Decreased strength and ROM as expected post-operatively.  RLE Sensation: decreased light touch   Cervical / Trunk Assessment Cervical / Trunk Assessment: Normal   Communication Communication Communication: No difficulties   Cognition Arousal/Alertness: Awake/alert Behavior During Therapy: WFL for tasks assessed/performed Overall Cognitive Status: Within Functional Limits for tasks assessed                                     General Comments  Pt on RA on my arrival and SpO2 92% and greater at rest. SpO2 on return to room from ambulation in hallway (approximately 230 feet) SpO2 90% on RA. At rest, noted desaturation to 88% which rebounded to 93% with encouragement concerning breathing techniques.     Exercises Exercises: General Lower Extremity General Exercises - Lower Extremity Ankle Circles/Pumps: AROM;Right;15 reps;Seated Heel Slides: AROM;Right;10 reps;Seated   Shoulder Instructions      Home Living Family/patient expects to be discharged to:: Private residence Living Arrangements: Spouse/significant other Available Help at Discharge: Family;Available 24 hours/day(multiple family members trading off) Type of Home: House Home Access: Stairs to enter Technical brewer of Steps: 3 Entrance Stairs-Rails: Right Home Layout: One level     Bathroom Shower/Tub: Walk-in shower(but "I'm not going to shower for a while")   Biochemist, clinical: Standard     Home Equipment: Walker - 2 wheels;Crutches;Cane - single point          Prior Functioning/Environment Level of Independence: Independent        Comments: pt has been independent but walking distance has been limited by symptoms of intermittent claudication.         OT Problem List: Decreased strength;Decreased range of motion;Decreased activity tolerance;Impaired balance (sitting and/or  standing);Decreased safety awareness;Decreased knowledge of use of DME or AE;Decreased knowledge of precautions;Cardiopulmonary status limiting activity;Pain      OT Treatment/Interventions: Self-care/ADL training;Therapeutic exercise;DME and/or AE instruction;Energy conservation;Therapeutic activities;Patient/family education;Balance training;Neuromuscular education    OT Goals(Current goals can be found in the care plan section) Acute Rehab OT Goals Patient Stated Goal: walk as much as possible OT Goal Formulation: With patient/family Time For Goal Achievement: 07/18/17 Potential to Achieve Goals: Good ADL Goals Pt Will Perform Grooming: with modified independence;standing Pt Will Perform Lower Body Dressing: with modified independence;with adaptive equipment;sit to/from stand(with or without AE) Pt Will Transfer to Toilet: with modified independence;ambulating;bedside commode(BSC over toilet) Pt Will Perform Toileting - Clothing Manipulation and hygiene: with modified independence;sit to/from stand Pt Will Perform Tub/Shower Transfer: with modified independence;Shower transfer;rolling walker;3 in 1  OT Frequency: Min 2X/week   Barriers to D/C:            Co-evaluation              AM-PAC PT "6 Clicks" Daily Activity     Outcome Measure Help from another person eating meals?: None Help from another person taking care of personal grooming?: A Little Help from another person toileting, which includes using toliet, bedpan, or urinal?: A Little Help from another person bathing (including washing, rinsing, drying)?: A Lot Help from another person to  put on and taking off regular upper body clothing?: A Little Help from another person to put on and taking off regular lower body clothing?: A Lot 6 Click Score: 17   End of Session Equipment Utilized During Treatment: Gait belt;Rolling walker  Activity Tolerance: Patient tolerated treatment well Patient left: in bed;with call  bell/phone within reach;with family/visitor present(seated at EOB)  OT Visit Diagnosis: Other abnormalities of gait and mobility (R26.89);Pain Pain - Right/Left: Right Pain - part of body: Leg                Time: 2336-1224 OT Time Calculation (min): 20 min Charges:  OT General Charges $OT Visit: 1 Visit OT Evaluation $OT Eval Moderate Complexity: 1 Mod G-Codes:     Norman Herrlich, MS OTR/L  Pager: [place] 07/04/2017, 5:07 PM

## 2017-07-04 NOTE — Evaluation (Signed)
Physical Therapy Evaluation Patient Details Name: Devin [name] MRN: [id number] DOB: [birth year] Today's Date: 07/04/2017   History of Present Illness   72 y.o. male is s/p R femoral to PTA bypass PTFE, R CFA endarterectomy, vein patch both anastomosis. PMH: CAD, PAD, CABG, back surgery, GERD, HOH, CVA, TIA.  Clinical Impression  Pt admitted with above diagnosis. Pt currently with functional limitations due to the deficits listed below (see PT Problem List). Pt ambulated on RA 200' with O2 sats remaining 91-93%. Antalgic gait pattern noted, assisted with RW. Discussed ROM of R knee and ankle and pt voiced understanding. Will follow acutely but do not anticipate him needing PT at d/c.  Pt will benefit from skilled PT to increase their independence and safety with mobility to allow discharge to the venue listed below.       Follow Up Recommendations Home health PT    Equipment Recommendations  None recommended by PT    Recommendations for Other Services       Precautions / Restrictions Precautions Precautions: Other (comment) Precaution Comments:  watch O2 sats Restrictions Weight Bearing Restrictions: No      Mobility  Bed Mobility               General bed mobility comments: pt received in chair  Transfers Overall transfer level: Needs assistance Equipment used: Rolling walker (2 wheeled) Transfers: Sit to/from Stand Sit to Stand: Min guard         General transfer comment: vc's for hand placement with RW. Min-guard for safety  Ambulation/Gait Ambulation/Gait assistance: Min guard Ambulation Distance (Feet): 200 Feet Assistive device: Rolling walker (2 wheeled) Gait Pattern/deviations: Antalgic Gait velocity: decreased Gait velocity interpretation: Below normal speed for age/gender General Gait Details: significant lean onto RW, decreased R df but able to get heel to floor.  Stairs            Wheelchair Mobility    Modified Rankin (Stroke  Patients Only)       Balance Overall balance assessment: Needs assistance Sitting-balance support: No upper extremity supported Sitting balance-Leahy Scale: Good     Standing balance support: No upper extremity supported Standing balance-Leahy Scale: Fair Standing balance comment: limited by painful RLE                             Pertinent Vitals/Pain Pain Assessment: Faces Faces Pain Scale: Hurts even more Pain Location: R LE with ambulation Pain Descriptors / Indicators: Throbbing;Aching Pain Intervention(s): Limited activity within patient's tolerance;Monitored during session    [place] expects to be discharged to:: Private residence Living Arrangements: Spouse/significant other Available Help at Discharge: Family;Available 24 hours/day Type of Home: House Home Access: Stairs to enter Entrance Stairs-Rails: Right Entrance Stairs-Number of Steps: 3 Home Layout: One level Home Equipment: Walker - 2 wheels;Crutches;Cane - single point      Prior Function Level of Independence: Independent         Comments: pt has been independent but walking distance has been limited by symptoms of intermittent claudication.      Hand Dominance        Extremity/Trunk Assessment   Upper Extremity Assessment Upper Extremity Assessment: Overall WFL for tasks assessed    Lower Extremity Assessment Lower Extremity Assessment: RLE deficits/detail RLE Deficits / Details: swelling noted lower leg and foot. small amount of oozing from distal incision noted after ambulation. R knee and ankle stiff due to pain and swelling. Strength  NT due to pain but sufficient for functional mobility RLE Sensation: decreased light touch    Cervical / Trunk Assessment Cervical / Trunk Assessment: Normal  Communication   Communication: No difficulties  Cognition Arousal/Alertness: Awake/alert Behavior During Therapy: WFL for tasks assessed/performed Overall  Cognitive Status: Within Functional Limits for tasks assessed                                        General Comments General comments (skin integrity, edema, etc.): O2 sats 98% on 4L O2 before ambulation. 91-93% on RA during ambulation. O2 lowered to 2L after session and RN notified.     Exercises General Exercises - Lower Extremity Ankle Circles/Pumps: AROM;Right;15 reps;Seated Heel Slides: AROM;Right;10 reps;Seated   Assessment/Plan    PT Assessment Patient needs continued PT services  PT Problem List Decreased activity tolerance;Decreased balance;Decreased mobility;Decreased knowledge of use of DME;Decreased knowledge of precautions;Pain;Cardiopulmonary status limiting activity       PT Treatment Interventions DME instruction;Gait training;Stair training;Functional mobility training;Therapeutic activities;Therapeutic exercise;Balance training;Patient/family education    PT Goals (Current goals can be found in the Care Plan section)  Acute Rehab PT Goals Patient Stated Goal: return home tomorrow PT Goal Formulation: With patient Time For Goal Achievement: 07/18/17 Potential to Achieve Goals: Good    Frequency Min 3X/week   Barriers to discharge        Co-evaluation               AM-PAC PT "6 Clicks" Daily Activity  Outcome Measure Difficulty turning over in bed (including adjusting bedclothes, sheets and blankets)?: A Little Difficulty moving from lying on back to sitting on the side of the bed? : A Little Difficulty sitting down on and standing up from a chair with arms (e.g., wheelchair, bedside commode, etc,.)?: A Little Help needed moving to and from a bed to chair (including a wheelchair)?: A Little Help needed walking in hospital room?: A Little Help needed climbing 3-5 steps with a railing? : A Little 6 Click Score: 18    End of Session Equipment Utilized During Treatment: Gait belt Activity Tolerance: Patient tolerated treatment  well Patient left: in chair;with call bell/phone within reach;with family/visitor present Nurse Communication: Mobility status;Other (comment)(O2 status) PT Visit Diagnosis: Pain;Difficulty in walking, not elsewhere classified (R26.2) Pain - Right/Left: Right Pain - part of body: Leg    Time: 1055-1120 PT Time Calculation (min) (ACUTE ONLY): 25 min   Charges:   PT Evaluation $PT Eval Moderate Complexity: 1 Mod PT Treatments $Gait Training: 8-22 mins   PT G Codes:        [place]  [place] 07/04/2017, 1:36 PM

## 2017-07-05 ENCOUNTER — Telehealth: Payer: Self-pay | Admitting: Vascular Surgery

## 2017-07-05 LAB — CBC
HEMATOCRIT: 29 % — AB (ref 39.0–52.0)
Hemoglobin: 9.5 g/dL — ABNORMAL LOW (ref 13.0–17.0)
MCH: 31.9 pg (ref 26.0–34.0)
MCHC: 32.8 g/dL (ref 30.0–36.0)
MCV: 97.3 fL (ref 78.0–100.0)
Platelets: 121 10*3/uL — ABNORMAL LOW (ref 150–400)
RBC: 2.98 MIL/uL — ABNORMAL LOW (ref 4.22–5.81)
RDW: 13.2 % (ref 11.5–15.5)
WBC: 9.7 10*3/uL (ref 4.0–10.5)

## 2017-07-05 MED ORDER — APIXABAN 5 MG PO TABS: 5.0000 mg | ORAL_TABLET | Freq: Two times a day (BID) | ORAL | 0 refills | Status: DC

## 2017-07-05 MED ORDER — HYDROCODONE-ACETAMINOPHEN 5-325 MG PO TABS: 1.0000 | ORAL_TABLET | Freq: Four times a day (QID) | ORAL | 0 refills | Status: DC | PRN

## 2017-07-05 NOTE — Progress Notes (Signed)
Physical Therapy Treatment Patient Details Name: Devin [name] MRN: [id number] DOB: 02-23-1946 Today's Date: 07/05/2017    History of Present Illness Pt is a 72 y.o. male admitted 07/03/17 and now s/p R femoral to PTA bypass PTFE, R CFA endarterectomy, and vein patch both anastomosis. PMH: CAD, PAD, CABG, back surgery, GERD, HOH, CVA, TIA.   PT Comments    Pt progressing well with mobility. Able to amb using RW and ascend/descend steps with supervision for safety. Pt will have 24/7 support at home. From a mobility, feel pt is safe to return home with supervision from family and HHPT services. Will continue to follow acutely to address established goals.   Follow Up Recommendations  Home health PT     Equipment Recommendations  None recommended by PT    Recommendations for Other Services       Precautions / Restrictions Precautions Precautions: Fall Restrictions Weight Bearing Restrictions: No    Mobility  Bed Mobility               General bed mobility comments: sitting at EOB upon arrival  Transfers Overall transfer level: Needs assistance Equipment used: Rolling walker (2 wheeled) Transfers: Sit to/from Stand Sit to Stand: Supervision            Ambulation/Gait Ambulation/Gait assistance: Supervision Ambulation Distance (Feet): 400 Feet Assistive device: Rolling walker (2 wheeled) Gait Pattern/deviations: Antalgic;Trunk flexed Gait velocity: decreased Gait velocity interpretation: <1.8 ft/sec, indicative of risk for recurrent falls General Gait Details: Slow, slightly unsteady amb with significant lean on RW; supervision for safety. Cues to stop walking forward when pt lifts one hand off RW to scratch nose, wave, etc.   Stairs Stairs: Yes   Stair Management: Two rails;Step to pattern;Forwards Number of Stairs: 4 General stair comments: Ascend/descended 4 steps with bilat rails and supervision for safety  Wheelchair Mobility    Modified Rankin  (Stroke Patients Only)       Balance Overall balance assessment: Needs assistance Sitting-balance support: No upper extremity supported Sitting balance-Leahy Scale: Good     Standing balance support: No upper extremity supported;During functional activity;Bilateral upper extremity supported Standing balance-Leahy Scale: Fair Standing balance comment: Able to statically stand without UE support but relies on B UE support during functional mobility.                             Cognition Arousal/Alertness: Awake/alert Behavior During Therapy: WFL for tasks assessed/performed Overall Cognitive Status: Within Functional Limits for tasks assessed                                        Exercises      General Comments General comments (skin integrity, edema, etc.): Wife present during session      Pertinent Vitals/Pain Pain Assessment: Faces Faces Pain Scale: Hurts a little bit Pain Location: Stomach Pain Descriptors / Indicators: Discomfort Pain Intervention(s): Monitored during session    Home Living                      Prior Function            PT Goals (current goals can now be found in the care plan section) Acute Rehab PT Goals Patient Stated Goal: Return home PT Goal Formulation: With patient Time For Goal Achievement: 07/18/17 Potential to Achieve Goals: Good Progress  towards PT goals: Progressing toward goals    Frequency    Min 3X/week      PT Plan Current plan remains appropriate    Co-evaluation              AM-PAC PT "6 Clicks" Daily Activity  Outcome Measure  Difficulty turning over in bed (including adjusting bedclothes, sheets and blankets)?: None Difficulty moving from lying on back to sitting on the side of the bed? : None Difficulty sitting down on and standing up from a chair with arms (e.g., wheelchair, bedside commode, etc,.)?: A Little Help needed moving to and from a bed to chair (including  a wheelchair)?: A Little Help needed walking in hospital room?: A Little Help needed climbing 3-5 steps with a railing? : A Little 6 Click Score: 20    End of Session   Activity Tolerance: Patient tolerated treatment well Patient left: Other (comment);with family/visitor present(amb with OT) Nurse Communication: Mobility status PT Visit Diagnosis: Difficulty in walking, not elsewhere classified (R26.2)     Time: 1093-2355 PT Time Calculation (min) (ACUTE ONLY): 10 min  Charges:  $Gait Training: 8-22 mins                    G Codes:      Mabeline Caras, PT, DPT Acute Rehab Services  Pager: [place] 07/05/2017, 9:36 AM

## 2017-07-05 NOTE — Telephone Encounter (Signed)
Sched appt 07/27/17 at 1:30. Lm on pt's hm# to inform them of appt.

## 2017-07-05 NOTE — Progress Notes (Signed)
Occupational Therapy Treatment Patient Details Name: Devin [name] MRN: [id number] DOB: 1946/02/13 Today's Date: 07/05/2017    History of present illness Pt is a 72 y.o. male admitted 07/03/17 and now s/p R femoral to PTA bypass PTFE, R CFA endarterectomy, and vein patch both anastomosis. PMH: CAD, PAD, CABG, back surgery, GERD, HOH, CVA, TIA.   OT comments  Pt making progress towards OT goals this session. Able to demonstrate improved safety and independence in transfers, sink level grooming, and discussed shower/tub safety at length. Pt will have 24 hour support from family to assist with LB ADL as needed until he fully recovers. All education complete. No further questions or concerns for OT from patient or wife.   Follow Up Recommendations  No OT follow up;Supervision/Assistance - 24 hour    Equipment Recommendations  3 in 1 bedside commode    Recommendations for Other Services      Precautions / Restrictions Precautions Precautions: Fall Precaution Comments:  watch O2 sats Restrictions Weight Bearing Restrictions: No       Mobility Bed Mobility               General bed mobility comments: greeted in hall ambulating with PT, sitting EOB at the EOS  Transfers Overall transfer level: Needs assistance Equipment used: Rolling walker (2 wheeled) Transfers: Sit to/from Stand Sit to Stand: Supervision         General transfer comment: good hand placement observed    Balance Overall balance assessment: Needs assistance Sitting-balance support: No upper extremity supported Sitting balance-Leahy Scale: Good     Standing balance support: No upper extremity supported;During functional activity;Bilateral upper extremity supported Standing balance-Leahy Scale: Fair Standing balance comment: Able to statically stand without UE support but relies on B UE support during functional mobility.                            ADL either performed or assessed with  clinical judgement   ADL Overall ADL's : Needs assistance/impaired     Grooming: Supervision/safety;Standing;Wash/dry hands;Wash/dry face;Oral care Grooming Details (indicate cue type and reason): sink level       Lower Body Bathing Details (indicate cue type and reason): Pt shared that he plans on sponge bathing when he returns home until he can take his baths again         Toilet Transfer: Supervision/safety;Ambulation;RW;Comfort height toilet;Grab bars Toilet Transfer Details (indicate cue type and reason): supervision throughout transfer [place] and Hygiene: Supervision/safety;Sit to/from stand Toileting - Clothing Manipulation Details (indicate cue type and reason): good hand placement for sit to stand and balance for rear peri care   Tub/Shower Transfer Details (indicate cue type and reason): Pt plans on sponge bathing upon discharge until he can get all the way in the tub Functional mobility during ADLs: Supervision/safety;Rolling walker       Vision       Perception     Praxis      Cognition Arousal/Alertness: Awake/alert Behavior During Therapy: WFL for tasks assessed/performed Overall Cognitive Status: Within Functional Limits for tasks assessed                                          Exercises     Shoulder Instructions       General Comments Wife present throughout session    Pertinent Vitals/  Pain       Pain Assessment: Faces Faces Pain Scale: Hurts a little bit Pain Location: Stomach Pain Descriptors / Indicators: Discomfort Pain Intervention(s): Monitored during session;Repositioned  Home Living                                          Prior Functioning/Environment              Frequency  Min 2X/week        Progress Toward Goals  OT Goals(current goals can now be found in the care plan section)  Progress towards OT goals: Progressing toward goals  Acute Rehab OT  Goals Patient Stated Goal: Return home OT Goal Formulation: With patient/family Time For Goal Achievement: 07/18/17 Potential to Achieve Goals: Good  Plan Discharge plan remains appropriate;Frequency remains appropriate    Co-evaluation                 AM-PAC PT "6 Clicks" Daily Activity     Outcome Measure   Help from another person eating meals?: None Help from another person taking care of personal grooming?: A Little Help from another person toileting, which includes using toliet, bedpan, or urinal?: A Little Help from another person bathing (including washing, rinsing, drying)?: A Lot Help from another person to put on and taking off regular upper body clothing?: A Little Help from another person to put on and taking off regular lower body clothing?: A Lot 6 Click Score: 17    End of Session Equipment Utilized During Treatment: Rolling walker  OT Visit Diagnosis: Other abnormalities of gait and mobility (R26.89);Pain Pain - Right/Left: Right Pain - part of body: Leg   Activity Tolerance Patient tolerated treatment well   Patient Left in bed;with call bell/phone within reach;with family/visitor present(seated EOB)   Nurse Communication Mobility status        Time: 7867-5449 OT Time Calculation (min): 26 min  Charges: OT General Charges $OT Visit: 1 Visit OT Treatments $Self Care/Home Management : 8-22 mins $Therapeutic Activity: 8-22 mins  Hulda Humphrey OTR/L [place] 07/05/2017, 10:12 AM

## 2017-07-05 NOTE — Care Management Note (Addendum)
Case Management Note Marvetta Gibbons RN, BSN Unit 4E-Case Manager [phone]   Patient Details  Name: JESIAH GRISMER MRN: [id number] Date of Birth: 1946-02-11  Subjective/Objective:   Pt admitted s/p femoral to PTA bypass, R CFA endarterectomy                 Action/Plan: PTA pt lived at home with spouse- plan to return home, notified by Jonelle Sidle with [facility] pt has pre-op referral for any HH needs for discharge.   Expected Discharge Date:  07/05/17               Expected Discharge Plan:  [place]  In-House Referral:  NA  Discharge planning Services  CM Consult  Post Acute Care Choice:  Home Health Choice offered to:  Patient  DME Arranged:  N/A DME Agency:  NA  HH Arranged:  PT HH Agency:  [facility]  Status of Service:  Completed, signed off  If discussed at [place] of Stay Meetings, dates discussed:    Discharge Disposition: home/home health   Additional Comments:  07/05/17- 1020- Delton Stelle RN, CM- pt for d/c home today- HHPT order placed- spoke with pt at bedside- discussed pre-op referral to [facility] pt states he is agreeable to this- pt reports that he has a RW at home- notified Tiffany with [facility] of pt's discharge for today they will plan to see pt tomorrow in the home.  Update- bedside RN- noted pt started on Eliquis- script sent to [facility] mailorder - will have PA print paper script for 30 day no refills for pt to use with 30 day free card on discharge- 30 day free card given to RN to give to pt to take with script to local pharmacy.   Dawayne Patricia, RN 07/05/2017, 10:22 AM

## 2017-07-05 NOTE — Discharge Instructions (Signed)
[facility]  Discharge instructions  Lower Extremity Bypass Surgery  Please refer to the following instruction for your post-procedure care. Your surgeon or physician assistant will discuss any changes with you.  Activity  You are encouraged to walk as much as you can. You can slowly return to normal activities during the month after your surgery. Avoid strenuous activity and heavy lifting until your doctor tells you it's OK. Avoid activities such as vacuuming or swinging a golf club. Do not drive until your doctor give the OK and you are no longer taking prescription pain medications. It is also normal to have difficulty with sleep habits, eating and bowel movement after surgery. These will go away with time.  Bathing/Showering  You may shower after you go home. Do not soak in a bathtub, hot tub, or swim until the incision heals completely.  Incision Care  Clean your incision with mild soap and water. Shower every day. Pat the area dry with a clean towel. You do not need a bandage unless otherwise instructed. Do not apply any ointments or creams to your incision. If you have open wounds you will be instructed how to care for them or a visiting nurse may be arranged for you. If you have staples or sutures along your incision they will be removed at your post-op appointment. You may have skin glue on your incision. Do not peel it off. It will come off on its own in about one week. If you have a great deal of moisture in your groin, use a gauze help keep this area dry.  Diet  Resume your normal diet. There are no special food restrictions following this procedure. A low fat/ low cholesterol diet is recommended for all patients with vascular disease. In order to heal from your surgery, it is CRITICAL to get adequate nutrition. Your body requires vitamins, minerals, and protein. Vegetables are the best source of vitamins and minerals. Vegetables also provide the  perfect balance of protein. Processed food has little nutritional value, so try to avoid this.  Medications  Resume taking all your medications unless your doctor or nurse practitioner tells you not to. If your incision is causing pain, you may take over-the-counter pain relievers such as acetaminophen (Tylenol). If you were prescribed a stronger pain medication, please aware these medication can cause nausea and constipation. Prevent nausea by taking the medication with a snack or meal. Avoid constipation by drinking plenty of fluids and eating foods with high amount of fiber, such as fruits, vegetables, and grains. Take Colase 100 mg (an over-the-counter stool softener) twice a day as needed for constipation. Do not take Tylenol if you are taking prescription pain medications.  Follow Up  Our office will schedule a follow up appointment 2-3 weeks following discharge.  Please call us immediately for any of the following conditions  Severe or worsening pain in your legs or feet while at rest or while walking Increase pain, redness, warmth, or drainage (pus) from your incision site(s) Fever of 101 degree or higher The swelling in your leg with the bypass suddenly worsens and becomes more painful than when you were in the hospital If you have been instructed to feel your graft pulse then you should do so every day. If you can no longer feel this pulse, call the office immediately. Not all patients are given this instruction.  Leg swelling is common after leg bypass surgery.  The swelling should improve over a few months  following surgery. To improve the swelling, you may elevate your legs above the level of your heart while you are sitting or resting. Your surgeon or physician assistant may ask you to apply an ACE wrap or wear compression (TED) stockings to help to reduce swelling.  Reduce your risk of vascular disease  Stop smoking. If you would like help call [facility] at 1-800-QUIT-NOW  ([phone]) or [place] at ([phone].  Manage your cholesterol Maintain a desired weight Control your diabetes weight Control your diabetes Keep your blood pressure down  If you have any questions, please call the office at [phone]  Information on my medicine - ELIQUIS (apixaban)  This medication education was reviewed with me or my healthcare representative as part of my discharge preparation.  The pharmacist that spoke with me during my hospital stay was:  Lavenia Atlas, [facility]  Why was Eliquis prescribed for you? Eliquis was prescribed for you to reduce the risk of forming blood clots that can cause a stroke if you have a medical condition called atrial fibrillation (a type of irregular heartbeat) OR to reduce the risk of a blood clots forming after orthopedic surgery.  What do You need to know about Eliquis ? Take your Eliquis TWICE DAILY - one tablet in the morning and one tablet in the evening with or without food.  It would be best to take the doses about the same time each day.  If you have difficulty swallowing the tablet whole please discuss with your pharmacist how to take the medication safely.  Take Eliquis exactly as prescribed by your doctor and DO NOT stop taking Eliquis without talking to the doctor who prescribed the medication.  Stopping may increase your risk of developing a new clot or stroke.  Refill your prescription before you run out.  After discharge, you should have regular check-up appointments with your healthcare provider that is prescribing your Eliquis.  In the future your dose may need to be changed if your kidney function or weight changes by a significant amount or as you get older.  What do you do if you miss a dose? If you miss a dose, take it as soon as you remember on the same day and resume taking twice daily.  Do not take more than one dose of ELIQUIS at the same time.  Important Safety Information A possible side  effect of Eliquis is bleeding. You should call your healthcare provider right away if you experience any of the following: ? Bleeding from an injury or your nose that does not stop. ? Unusual colored urine (red or dark brown) or unusual colored stools (red or black). ? Unusual bruising for unknown reasons. ? A serious fall or if you hit your head (even if there is no bleeding).  Some medicines may interact with Eliquis and might increase your risk of bleeding or clotting while on Eliquis. To help avoid this, consult your healthcare provider or pharmacist prior to using any new prescription or non-prescription medications, including herbals, vitamins, non-steroidal anti-inflammatory drugs (NSAIDs) and supplements.  This website has more information on Eliquis (apixaban): [email or web address].no.

## 2017-07-05 NOTE — Telephone Encounter (Signed)
-----   Message from Mena Goes, RN sent at 07/05/2017  8:47 AM EST ----- Regarding: 2 weeks   ----- Message ----- From: Iline Oven Sent: 07/05/2017   7:47 AM To: [facility] Charge Pool  Can you schedule an appt for this pt in 2 weeks with Dr. Oneida Alar.  PO R fem to PTA bypass PTFE. Thanks, [facility]

## 2017-07-05 NOTE — Discharge Summary (Signed)
Physician Discharge Summary   Patient ID: Devin [name] [id number] 72 y.o. 04-17-1946  Admit date: 07/03/2017  Discharge date and time: 07/05/17   Admitting Physician: Devin Dutch, MD   Discharge Physician: same  Admission Diagnoses: claudicatiion  Discharge Diagnoses: same  Admission Condition: fair  Discharged Condition: fair  Indication for Admission: rest pain right foot  Hospital Course: Devin [name] is a 72 year old male who is experiencing rest pain of right foot due to peripheral arterial disease with right SFA occlusion.  He was brought in as an outpatient for right femoral to posterior tibial artery bypass with PTFE conduit, common femoral endarterectomy with vein patch, and vein patch of distal PT anastomosis by Devin [name] on 07/03/17.  He tolerated this procedure well and was admitted to the hospital for monitoring of circulatory status, pain control, wound care, and progression of activity postoperatively.  POD #1 patient was without rest pain.  He was evaluated by physical therapy who recommended home PT; this will be arranged by case management.  Postoperatively pharmacy was also consulted to initiate Eliquis.  He will be discharged on 5 mg twice daily.  Postoperative ABIs demonstrated an improved right ABI to 1.01.  Left ABI 0.44.  Once patient has fully recovered from right leg bypass surgery we will consider bypass surgery for left lower extremity.  He will follow-up in office in about 2 weeks.  He will be prescribed 3 days worth of narcotic pain medication for continued postoperative pain control.  He was also encouraged to elevate his right leg when not ambulating.  Discharge instructions were reviewed with the patient and his wife and they voiced their understanding.  After home PT has been confirmed with case management he will be discharged this morning in stable condition.  Consults: None  Treatments: surgery: Right femoral to posterior tibial artery bypass  PTFE, right common femoral endarterectomy with vein patch, vein patch distal PT anastomosis by Devin [name] on 07/03/17  Discharge Exam: Denies rest pain R foot.  Patient is feeling fit for discharge home this morning Vitals:   07/04/17 1500 07/05/17 0223  BP: (!) 96/57   Pulse: (!) 102   Resp: 18   Temp: 98.4 F (36.9 C)   SpO2: 97% 95%   Lungs:  Non labored Incisions:  R groin incisions soft without drainage or breakdown; R lowerleg incisions remain intact without drainage Extremities:  Palpable R PT Abdomen:  Soft Neurologic: A&O   Disposition: 01-Home or Self Care  Patient Instructions:  Allergies as of 07/05/2017      Reactions   Aspirin Other (See Comments)   Has to take the coated aspirin      Medication List    TAKE these medications   albuterol 108 (90 Base) MCG/ACT inhaler Commonly known as:  PROVENTIL HFA;VENTOLIN HFA Inhale 2 puffs into the lungs every 6 (six) hours as needed for wheezing or shortness of breath.   amLODipine 5 MG tablet Commonly known as:  NORVASC Take 1 tablet (5 mg total) by mouth daily. Please call [phone] to schedule annual appointment with Devin [name] 06-2017 thanks.   apixaban 5 MG Tabs tablet Commonly known as:  ELIQUIS Take 1 tablet (5 mg total) by mouth 2 (two) times daily.   ASPIRIN LOW DOSE 81 MG EC tablet Generic drug:  aspirin Take 81 mg by mouth daily.   atorvastatin 40 MG tablet Commonly known as:  LIPITOR TAKE 1 TABLET BY MOUTH  DAILY What changed:    how much  to take  how to take this  when to take this   Cholecalciferol 5000 units Tabs Take 5,000 Units by mouth daily.   cilostazol 100 MG tablet Commonly known as:  PLETAL Take 1 tablet (100 mg total) by mouth 2 (two) times daily.   COSOPT 22.3-6.8 MG/ML ophthalmic solution Generic drug:  dorzolamide-timolol Place 1 drop into the right eye 2 (two) times daily.   HYDROcodone-acetaminophen 5-325 MG tablet Commonly known as:  NORCO Take 1 tablet by mouth  every 6 (six) hours as needed for moderate pain.   IRON PO Take 1 tablet by mouth daily.   latanoprost 0.005 % ophthalmic solution Commonly known as:  XALATAN Place 1 drop into both eyes at bedtime.   nitroGLYCERIN 0.4 MG SL tablet Commonly known as:  NITROSTAT Place 1 tablet (0.4 mg total) under the tongue every 5 (five) minutes as needed for chest pain.   omeprazole 20 MG tablet Commonly known as:  PRILOSEC OTC Take 20 mg by mouth daily.   vitamin B-12 1000 MCG tablet Commonly known as:  CYANOCOBALAMIN Take 1,000 mcg by mouth daily.   zinc gluconate 50 MG tablet Take 50 mg by mouth daily.      Activity: activity as tolerated under the direction of home physical therapy Diet: regular diet Wound Care: [name] wound clean and dry and as directed  Follow-up with Devin [name] in 2 weeks.  SignedDagoberto [name] 07/05/2017 8:06 AM

## 2017-07-05 NOTE — Progress Notes (Signed)
07/05/2017 12:30 PM Discharge AVS meds taken today and those due this evening reviewed.  Follow-up appointments and when to call md reviewed.  D/C IV and TELE.  Questions and concerns addressed.   D/C home per orders. [place]

## 2017-07-06 ENCOUNTER — Encounter: Payer: Self-pay | Admitting: Vascular Surgery

## 2017-07-06 ENCOUNTER — Ambulatory Visit (INDEPENDENT_AMBULATORY_CARE_PROVIDER_SITE_OTHER): Payer: Medicare Other | Admitting: Vascular Surgery

## 2017-07-06 VITALS — BP 127/74 | HR 100 | Temp 98.0°F | Resp 18 | Ht 64.0 in | Wt 160.0 lb

## 2017-07-06 DIAGNOSIS — I739 Peripheral vascular disease, unspecified: Secondary | ICD-10-CM

## 2017-07-06 NOTE — Progress Notes (Signed)
POST OPERATIVE OFFICE NOTE    CC:  F/u for surgery  HPI:  This is a 72 y.o. male who is s/p Right fem-post tibial bypass with propaten.  He has had had multiple small episodes of bleeding form the distal lower leg incision.  He states he sits in a recliner with his right leg elevated, but every time he gets up his legs starts to bleed.  He reports no fever or chills.  The best thing is he has increased sensation and warmth to his right LE since his bypass.    He is on Eliquis, aspirin and pletal daily.    Allergies  Allergen Reactions  . Aspirin Other (See Comments)    Has to take the coated aspirin    Current Outpatient Medications  Medication Sig Dispense Refill  . albuterol (PROVENTIL HFA;VENTOLIN HFA) 108 (90 Base) MCG/ACT inhaler Inhale 2 puffs into the lungs every 6 (six) hours as needed for wheezing or shortness of breath.    Marland Kitchen amLODipine (NORVASC) 5 MG tablet Take 1 tablet (5 mg total) by mouth daily. Please call [phone] to schedule annual appointment with Dr. Burt Knack 06-2017 thanks. 90 tablet 3  . apixaban (ELIQUIS) 5 MG TABS tablet Take 1 tablet (5 mg total) by mouth 2 (two) times daily. 60 tablet 0  . aspirin (ASPIRIN LOW DOSE) 81 MG EC tablet Take 81 mg by mouth daily.      Marland Kitchen atorvastatin (LIPITOR) 40 MG tablet TAKE 1 TABLET BY MOUTH  DAILY (Patient taking differently: TAKE 1 TABLET BY MOUTH  IN THE EVENING) 90 tablet 3  . Cholecalciferol 5000 units TABS Take 5,000 Units by mouth daily.    . cilostazol (PLETAL) 100 MG tablet Take 1 tablet (100 mg total) by mouth 2 (two) times daily. 180 tablet 3  . dorzolamide-timolol (COSOPT) 22.3-6.8 MG/ML ophthalmic solution Place 1 drop into the right eye 2 (two) times daily.     Marland Kitchen HYDROcodone-acetaminophen (NORCO) 5-325 MG tablet Take 1 tablet by mouth every 6 (six) hours as needed for moderate pain. 15 tablet 0  . IRON PO Take 1 tablet by mouth daily.    Marland Kitchen latanoprost (XALATAN) 0.005 % ophthalmic solution Place 1 drop into both eyes  at bedtime.     . nitroGLYCERIN (NITROSTAT) 0.4 MG SL tablet Place 1 tablet (0.4 mg total) under the tongue every 5 (five) minutes as needed for chest pain. 25 tablet 3  . omeprazole (PRILOSEC OTC) 20 MG tablet Take 20 mg by mouth daily.      . vitamin B-12 (CYANOCOBALAMIN) 1000 MCG tablet Take 1,000 mcg by mouth daily.    Marland Kitchen zinc gluconate 50 MG tablet Take 50 mg by mouth daily.     No current facility-administered medications for this visit.      ROS:  See HPI  Physical Exam:  Vitals:   07/06/17 1249  BP: 127/74  Pulse: 100  Resp: 18  Temp: 98 F (36.7 C)  SpO2: 91%    Incision:  Incisions are intact without active bleeding.  The is severe edema in the right lower leg.  Groin incision is soft without hematoma and healing well. Extremities:  Palpable PT pulse with doppler signals in DP/peroneal.  Active range of motion of toes and ankle are intact.   Assessment/Plan:  This is a 72 y.o. male who is s/p:Right fem-posterior tibial bypass 07/03/2017.   We discussed the importance of elevation of the right LE.  I showed him a picture of the proper  positioning.  We will hold the Eliquis for now until further notice per Dr. Oneida Alar.  He will continue hi aspirin and Pletal.  I did place a light compressive dressing on the right lower leg with 2 4 inch ace wraps. He can remove this tomorrow.   Roxy Horseman PA-C [facility] [phone]  Clinic MD:  [facility]  History and exam details as above.  Patient has a patent graft with a palpable posterior tibial pulse.  He has had some intermittent bleeding from his distal anastomosis incision.  Most likely this is secondary to the Eliquis.  He has some swelling Which does not appear to be a hematoma but most likely just usual postop edema.  The incisions are intact currently but they are certainly under duress from the swelling.  We will stop his Eliquis today for at least 3 days.  He will call us back on Monday, July 10, 2017 to give [place] an update on the swelling.  If he has continued bleeding or worsening swelling he will call us back sooner.  If he is still having persistent problems on Monday he will most likely need to be admitted to the hospital.  I emphasized to the patient how important it is to elevate his leg and that swelling and incisional breakdown could result in graft infection and limb loss.  He also understands the importance of not smoking to assist with wound healing.  It was strongly emphasized how to elevate his leg and to have minimal activity over the next few days to try to get the oozing to stop.  [name], MD [facility] [place] Office: [phone] Pager: [phone]

## 2017-07-08 ENCOUNTER — Other Ambulatory Visit: Payer: Self-pay | Admitting: Cardiovascular Disease

## 2017-07-10 NOTE — Telephone Encounter (Signed)
Please review for refill, Thanks !  

## 2017-07-13 ENCOUNTER — Telehealth: Payer: Self-pay | Admitting: *Deleted

## 2017-07-13 NOTE — Telephone Encounter (Signed)
Call to patient. He stated things "are looking pretty good". Denies any bleeding or drainage and Nurse checked it yesterday. Instructed to call this office for any problems with his leg.

## 2017-07-27 ENCOUNTER — Ambulatory Visit (INDEPENDENT_AMBULATORY_CARE_PROVIDER_SITE_OTHER): Payer: Medicare Other | Admitting: Vascular Surgery

## 2017-07-27 ENCOUNTER — Encounter: Payer: Self-pay | Admitting: Vascular Surgery

## 2017-07-27 ENCOUNTER — Other Ambulatory Visit: Payer: Self-pay

## 2017-07-27 VITALS — BP 117/70 | HR 90 | Temp 97.2°F | Resp 18 | Ht 64.0 in | Wt 151.0 lb

## 2017-07-27 DIAGNOSIS — I739 Peripheral vascular disease, unspecified: Secondary | ICD-10-CM

## 2017-07-27 NOTE — Progress Notes (Signed)
Patient is a 72 year old male who returns for follow-up today.  He has had no further bleeding.  The swelling in his leg has improved.  He is walking some but not enough to notice whether or not his claudication symptoms are improved.  He is currently on aspirin and Pletal.  We had placed him on Eliquis perioperatively but because of his bleeding was stopped this.  Past Medical History:  Diagnosis Date  . Arthritis   . Asthma   . Carpal tunnel syndrome   . Chest pain, unspecified   . Chronic kidney disease    hx of kidney stones  . Coronary artery disease   . Coronary atherosclerosis of artery bypass graft   . GERD (gastroesophageal reflux disease)   . History of kidney stones   . HOH (hard of hearing)   . Hypertension   . 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 Other and unspecified hyperlipidemia   . Personal history of unspecified circulatory disease   . Shortness of breath   . Stroke ([place])   . TIA (transient ischemic attack)    Physical exam:  Vitals:   07/27/17 1337  BP: 117/70  Pulse: 90  Resp: 18  Temp: (!) 97.2 F (36.2 C)  TempSrc: Oral  SpO2: 95%  Weight: 151 lb (68.5 kg)  Height: 5\' 4"  (1.626 m)    Extremities: Healing right lower extremity leg wounds no drainage no erythema, biphasic right posterior tibial Doppler signal.  Monophasic but brisk dorsalis pedis and peroneal Dopplers.  Foot is pink and warm.  Swelling is significantly improved from his last office visit.  However it is still present.  Assessment: Patent bypass right lower extremity improved incisions and swelling.  I discussed with patient today the possibility of starting Eliquis.  He was reluctant to proceed with this because of the bleeding problems.  I am somewhat on the fence about whether or not to start it as he does have a disadvantaged bypass graft but the evidence supporting patency related to Eliquis or any other anticoagulation he is equivocal at  best.  Plan: The patient will start to ambulate some and continue to elevate his leg if it swells.  He will return in May 2019 for duplex exam and ABIs.  He will call us if he has any problems sooner.  [name], MD [facility] [place] Office: [phone] Pager: [phone]

## 2017-08-01 NOTE — Addendum Note (Signed)
Addended by: Lianne Cure A on: 08/01/2017 03:22 PM   Modules accepted: Orders

## 2017-08-07 ENCOUNTER — Ambulatory Visit (INDEPENDENT_AMBULATORY_CARE_PROVIDER_SITE_OTHER): Payer: Medicare Other | Admitting: Cardiovascular Disease

## 2017-08-07 ENCOUNTER — Encounter: Payer: Self-pay | Admitting: Cardiovascular Disease

## 2017-08-07 VITALS — BP 114/54 | HR 74 | Ht 64.0 in | Wt 156.1 lb

## 2017-08-07 DIAGNOSIS — E785 Hyperlipidemia, unspecified: Secondary | ICD-10-CM | POA: Diagnosis not present

## 2017-08-07 DIAGNOSIS — I25810 Atherosclerosis of coronary artery bypass graft(s) without angina pectoris: Secondary | ICD-10-CM

## 2017-08-07 NOTE — Progress Notes (Signed)
Cardiology Office Note Date:  08/07/2017   ID:  Devin [name], DOB 1945-07-29, MRN [id number]  PCP:  [name], MD  Cardiologist:  Sherren Mocha, MD    Chief Complaint  Patient presents with  . Follow-up    CAD     History of Present Illness: Devin [name] is a 72 y.o. male who presents for follow-up of coronary and peripheral arterial disease.  The patient underwent remote CABG with most recent heart catheterization in 2007 demonstrating patency of his bypass grafts.  He developed progressive symptoms of claudication and ultimately rest pain requiring femoral tibial bypass last month by Dr Oneida Alar. He continues to follow with Dr Oneida Alar and Dr Fletcher Anon. He did not have any cardiac-related problems with surgery. PTFE conduit was used for bypass as the patient had undergone SVG harvesting for CABG from the right leg.  He was started on apixaban because of concern of high risk for graft occlusion.  Unfortunately he was unable to tolerate this because of bleeding from his incision sites.  He is taking aspirin and cilostazol.  Feels like his leg is healing well.  Continues to have swelling but less pain when he walks now.  He denies chest pain, chest pressure, or shortness of breath.  His cardiac medicines are unchanged.  No orthopnea, PND, or heart palpitations.   Past Medical History:  Diagnosis Date  . Arthritis   . Asthma   . Carpal tunnel syndrome   . Chest pain, unspecified   . Chronic kidney disease    hx of kidney stones  . Coronary artery disease   . Coronary atherosclerosis of artery bypass graft   . GERD (gastroesophageal reflux disease)   . History of kidney stones   . HOH (hard of hearing)   . Hypertension   . 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 Other and unspecified hyperlipidemia   . Personal history of unspecified circulatory disease   . Shortness of breath   . Stroke ([place])   . TIA (transient ischemic attack)      Past Surgical History:  Procedure Laterality Date  . ABDOMINAL AORTOGRAM N/A 06/14/2017   Procedure: ABDOMINAL AORTOGRAM;  Surgeon: Wellington Hampshire, MD;  Location: [facility];  Service: Cardiovascular;  Laterality: N/A;  . BACK SURGERY     cervical disc  . CARDIAC CATHETERIZATION    . CARPAL TUNNEL RELEASE     blateral carpal tunnel  . CORONARY ARTERY BYPASS GRAFT     x5 12 yrs ago  . ENDARTERECTOMY FEMORAL Right 07/03/2017   Procedure: ENDARTERECTOMY FEMORAL RIGHT;  Surgeon: Elam Dutch, MD;  Location: [facility];  Service: Vascular;  Laterality: Right;  . FEMORAL-POPLITEAL BYPASS GRAFT Right 07/03/2017   Procedure: BYPASS GRAFT FEMORAL-POPLITEAL ARTERY;  Surgeon: Elam Dutch, MD;  Location: [facility];  Service: Vascular;  Laterality: Right;  . LOWER EXTREMITY ANGIOGRAM Bilateral 08/05/2015   Procedure: Lower Extremity Angiogram;  Surgeon: Wellington Hampshire, MD;  Location: [facility];  Service: Cardiovascular;  Laterality: Bilateral;  . LOWER EXTREMITY ANGIOGRAPHY Bilateral 06/14/2017   Procedure: Lower Extremity Angiography;  Surgeon: Wellington Hampshire, MD;  Location: [facility];  Service: Cardiovascular;  Laterality: Bilateral;  . NECK SURGERY    . PATCH ANGIOPLASTY Right 07/03/2017   Procedure: VEIN PATCH ANGIOPLASTY OF FEMORAL AND POSTERIOR TIBIAL ARTERIES;  Surgeon: Elam Dutch, MD;  Location: [facility];  Service: Vascular;  Laterality: Right;  .  PERIPHERAL VASCULAR CATHETERIZATION N/A 08/05/2015   Procedure: Abdominal Aortogram;  Surgeon: Wellington Hampshire, MD;  Location: [facility];  Service: Cardiovascular;  Laterality: N/A;  . SHOULDER ARTHROSCOPY W/ ROTATOR CUFF REPAIR     Right    Current Outpatient Medications  Medication Sig Dispense Refill  . albuterol (PROVENTIL HFA;VENTOLIN HFA) 108 (90 Base) MCG/ACT inhaler Inhale 2 puffs into the lungs every 6 (six) hours as needed for wheezing or shortness of breath.    Marland Kitchen amLODipine (NORVASC) 5 MG  tablet Take 1 tablet (5 mg total) by mouth daily. Please call [phone] to schedule annual appointment with Dr. Burt Knack 06-2017 thanks. 90 tablet 3  . aspirin (ASPIRIN LOW DOSE) 81 MG EC tablet Take 81 mg by mouth daily.      Marland Kitchen atorvastatin (LIPITOR) 40 MG tablet TAKE 1 TABLET BY MOUTH  DAILY (Patient taking differently: TAKE 1 TABLET BY MOUTH  IN THE EVENING) 90 tablet 3  . Cholecalciferol 5000 units TABS Take 5,000 Units by mouth daily.    . cilostazol (PLETAL) 100 MG tablet TAKE 1 TABLET BY MOUTH TWO  TIMES DAILY 180 tablet 3  . dorzolamide-timolol (COSOPT) 22.3-6.8 MG/ML ophthalmic solution Place 1 drop into the right eye 2 (two) times daily.     Marland Kitchen HYDROcodone-acetaminophen (NORCO) 5-325 MG tablet Take 1 tablet by mouth every 6 (six) hours as needed for moderate pain. 15 tablet 0  . IRON PO Take 1 tablet by mouth daily.    Marland Kitchen latanoprost (XALATAN) 0.005 % ophthalmic solution Place 1 drop into both eyes at bedtime.     . nitroGLYCERIN (NITROSTAT) 0.4 MG SL tablet Place 1 tablet (0.4 mg total) under the tongue every 5 (five) minutes as needed for chest pain. 25 tablet 3  . omeprazole (PRILOSEC OTC) 20 MG tablet Take 20 mg by mouth daily.      . vitamin B-12 (CYANOCOBALAMIN) 1000 MCG tablet Take 1,000 mcg by mouth daily.    Marland Kitchen zinc gluconate 50 MG tablet Take 50 mg by mouth daily.     No current facility-administered medications for this visit.     Allergies:   Aspirin   Social History:  The patient  reports that he quit smoking about 2 months ago. His smoking use included cigarettes and cigars. He has a 50.00 pack-year smoking history. he has never used smokeless tobacco. He reports that he does not drink alcohol or use drugs.   Family History:  The patient's family history includes Arthritis in his unknown relative; Asthma in his unknown relative; Coronary artery disease in his unknown relative; Heart disease in his unknown relative.    ROS:  Please see the history of present illness.   Otherwise, review of systems is positive for leg swelling.  All other systems are reviewed and negative.    PHYSICAL EXAM: VS:  BP (!) 114/54   Pulse 74   Ht 5\' 4"  (1.626 m)   Wt 156 lb 1.9 oz (70.8 kg)   BMI 26.80 kg/m  , BMI Body mass index is 26.8 kg/m. GEN: Well nourished, well developed, in no acute distress  HEENT: normal  Neck: no JVD, no masses. No carotid bruits Cardiac: RRR without murmur or gallop                Respiratory:  clear to auscultation bilaterally, normal work of breathing GI: soft, nontender, nondistended, + BS MS: no deformity or atrophy  Ext: 2+ pretibial edema on the right, 1+ pretibial edema on  the left Skin: warm and dry, no rash Neuro:  Strength and sensation are intact Psych: euthymic mood, full affect  EKG:  EKG is not ordered today.  Recent Labs: 06/27/2017: ALT 23 07/03/2017: BUN 11; Creatinine, Ser 1.04; Potassium 4.3; Sodium 139 07/05/2017: Hemoglobin 9.5; Platelets 121   Lipid Panel  No results found for: CHOL, TRIG, HDL, CHOLHDL, VLDL, LDLCALC, LDLDIRECT    Wt Readings from Last 3 Encounters:  08/07/17 156 lb 1.9 oz (70.8 kg)  07/27/17 151 lb (68.5 kg)  07/06/17 160 lb (72.6 kg)     Cardiac Studies Reviewed: Nuclear Stress Test 06-21-2017: Study Highlights     Nuclear stress EF: 55%.  Blood pressure demonstrated a normal response to exercise.  There was no ST segment deviation noted during stress.  The study is normal.  This is a low risk study.  The left ventricular ejection fraction is normal (55-65%).   Normal resting and stress perfusion. No ischemia or infarction EF 55%     ASSESSMENT AND PLAN: 1.  CAD, native vessel, without angina: The patient is status post remote CABG.  A preoperative stress nuclear scan done in January 2019 showed no ischemia and normal LV function.  He will continue on his current regimen.  2.  PAD with rest pain: Status post bypass grafting of the right leg.  May require left leg surgery as  well.  Followed closely by Dr. Fletcher Anon and Dr. Oneida Alar.  He continues on aspirin and cilostazol.  3.  Hypertension: Blood pressure is controlled on amlodipine.  4.  Hyperlipidemia: Treated with atorvastatin 40 mg daily.  Lipids followed by his primary physician.  5.  Tobacco abuse: He has quit smoking since his vascular surgery.  States this is been tough for him.   Current medicines are reviewed with the patient today.  The patient does not have concerns regarding medicines.  Labs/ tests ordered today include:  No orders of the defined types were placed in this encounter.   Disposition:   FU one year  Signed, Sherren Mocha, MD  08/07/2017 1:39 PM    [facility] [address], [place], [place]  [address] Phone: [phone]; Fax: [phone]

## 2017-08-07 NOTE — Patient Instructions (Signed)

## 2017-08-22 ENCOUNTER — Ambulatory Visit (INDEPENDENT_AMBULATORY_CARE_PROVIDER_SITE_OTHER): Payer: Medicare Other | Admitting: Cardiovascular Disease

## 2017-08-22 ENCOUNTER — Encounter: Payer: Self-pay | Admitting: Cardiovascular Disease

## 2017-08-22 VITALS — BP 137/72 | HR 71 | Ht 64.0 in | Wt 156.8 lb

## 2017-08-22 DIAGNOSIS — I739 Peripheral vascular disease, unspecified: Secondary | ICD-10-CM | POA: Diagnosis not present

## 2017-08-22 DIAGNOSIS — I25810 Atherosclerosis of coronary artery bypass graft(s) without angina pectoris: Secondary | ICD-10-CM | POA: Diagnosis not present

## 2017-08-22 DIAGNOSIS — Z720 Tobacco use: Secondary | ICD-10-CM

## 2017-08-22 DIAGNOSIS — E785 Hyperlipidemia, unspecified: Secondary | ICD-10-CM | POA: Diagnosis not present

## 2017-08-22 DIAGNOSIS — I2510 Atherosclerotic heart disease of native coronary artery without angina pectoris: Secondary | ICD-10-CM | POA: Diagnosis not present

## 2017-08-22 DIAGNOSIS — I10 Essential (primary) hypertension: Secondary | ICD-10-CM

## 2017-08-22 NOTE — Progress Notes (Signed)
Cardiology Office Note   Date:  08/22/2017   ID:  Aimee, Timmons Sep 16, 1945, MRN [id number]  PCP:  [name], MD  Cardiologist:  Dr. Burt Knack  No chief complaint on file.     History of Present Illness: Devin [name] is a 72 y.o. male who presents for  a follow-up visit regarding peripheral arterial disease. He has known history of CAD s/p CABG years ago, cardiac cath in 2007 showed patent grafts.  He has known history of hyperlipidemia and tobacco use. He is not diabetic.  He had worsening claudication noted during last visit.  Angiography was done in January showed no significant aortoiliac disease.  On the right side, there was moderate calcified disease affecting the common femoral artery with flush occlusion of the SFA with reconstitution distally and severe popliteal disease with two-vessel runoff below the knee.  On the left side, there was long occlusion of the left SFA and severe popliteal disease with three-vessel runoff below the knee. He underwent right common artery endarterectomy and right femoral to below the knee popliteal artery bypass by Dr. Oneida Alar.  He is recovering slowly.  He continues to have swelling in that leg which is gradually improving. He continues to have significant left calf claudication. He quit smoking in January.    Past Medical History:  Diagnosis Date  . Arthritis   . Asthma   . Carpal tunnel syndrome   . Chest pain, unspecified   . Chronic kidney disease    hx of kidney stones  . Coronary artery disease   . Coronary atherosclerosis of artery bypass graft   . GERD (gastroesophageal reflux disease)   . History of kidney stones   . HOH (hard of hearing)   . Hypertension   . 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 Other and unspecified hyperlipidemia   . Personal history of unspecified circulatory disease   . Shortness of breath   . Stroke ([place])   . TIA (transient ischemic attack)      Past Surgical History:  Procedure Laterality Date  . ABDOMINAL AORTOGRAM N/A 06/14/2017   Procedure: ABDOMINAL AORTOGRAM;  Surgeon: Wellington Hampshire, MD;  Location: [facility];  Service: Cardiovascular;  Laterality: N/A;  . BACK SURGERY     cervical disc  . CARDIAC CATHETERIZATION    . CARPAL TUNNEL RELEASE     blateral carpal tunnel  . CORONARY ARTERY BYPASS GRAFT     x5 12 yrs ago  . ENDARTERECTOMY FEMORAL Right 07/03/2017   Procedure: ENDARTERECTOMY FEMORAL RIGHT;  Surgeon: Elam Dutch, MD;  Location: [facility];  Service: Vascular;  Laterality: Right;  . FEMORAL-POPLITEAL BYPASS GRAFT Right 07/03/2017   Procedure: BYPASS GRAFT FEMORAL-POPLITEAL ARTERY;  Surgeon: Elam Dutch, MD;  Location: [facility];  Service: Vascular;  Laterality: Right;  . LOWER EXTREMITY ANGIOGRAM Bilateral 08/05/2015   Procedure: Lower Extremity Angiogram;  Surgeon: Wellington Hampshire, MD;  Location: [facility];  Service: Cardiovascular;  Laterality: Bilateral;  . LOWER EXTREMITY ANGIOGRAPHY Bilateral 06/14/2017   Procedure: Lower Extremity Angiography;  Surgeon: Wellington Hampshire, MD;  Location: [facility];  Service: Cardiovascular;  Laterality: Bilateral;  . NECK SURGERY    . PATCH ANGIOPLASTY Right 07/03/2017   Procedure: VEIN PATCH ANGIOPLASTY OF FEMORAL AND POSTERIOR TIBIAL ARTERIES;  Surgeon: Elam Dutch, MD;  Location: [facility];  Service: Vascular;  Laterality: Right;  . PERIPHERAL VASCULAR CATHETERIZATION N/A  08/05/2015   Procedure: Abdominal Aortogram;  Surgeon: Wellington Hampshire, MD;  Location: [facility];  Service: Cardiovascular;  Laterality: N/A;  . SHOULDER ARTHROSCOPY W/ ROTATOR CUFF REPAIR     Right     Current Outpatient Medications  Medication Sig Dispense Refill  . albuterol (PROVENTIL HFA;VENTOLIN HFA) 108 (90 Base) MCG/ACT inhaler Inhale 2 puffs into the lungs every 6 (six) hours as needed for wheezing or shortness of breath.    Marland Kitchen amLODipine (NORVASC) 5 MG  tablet Take 1 tablet (5 mg total) by mouth daily. Please call [phone] to schedule annual appointment with Dr. Burt Knack 06-2017 thanks. 90 tablet 3  . aspirin (ASPIRIN LOW DOSE) 81 MG EC tablet Take 81 mg by mouth daily.      Marland Kitchen atorvastatin (LIPITOR) 40 MG tablet TAKE 1 TABLET BY MOUTH  DAILY (Patient taking differently: TAKE 1 TABLET BY MOUTH  IN THE EVENING) 90 tablet 3  . Cholecalciferol 5000 units TABS Take 5,000 Units by mouth daily.    . cilostazol (PLETAL) 100 MG tablet TAKE 1 TABLET BY MOUTH TWO  TIMES DAILY 180 tablet 3  . dorzolamide-timolol (COSOPT) 22.3-6.8 MG/ML ophthalmic solution Place 1 drop into the right eye 2 (two) times daily.     Marland Kitchen HYDROcodone-acetaminophen (NORCO) 5-325 MG tablet Take 1 tablet by mouth every 6 (six) hours as needed for moderate pain. 15 tablet 0  . IRON PO Take 1 tablet by mouth daily.    Marland Kitchen latanoprost (XALATAN) 0.005 % ophthalmic solution Place 1 drop into both eyes at bedtime.     . nitroGLYCERIN (NITROSTAT) 0.4 MG SL tablet Place 1 tablet (0.4 mg total) under the tongue every 5 (five) minutes as needed for chest pain. 25 tablet 3  . omeprazole (PRILOSEC OTC) 20 MG tablet Take 20 mg by mouth daily.      . vitamin B-12 (CYANOCOBALAMIN) 1000 MCG tablet Take 1,000 mcg by mouth daily.    Marland Kitchen zinc gluconate 50 MG tablet Take 50 mg by mouth daily.     No current facility-administered medications for this visit.     Allergies:   Aspirin    Social History:  The patient  reports that he quit smoking about 2 months ago. His smoking use included cigarettes and cigars. He has a 50.00 pack-year smoking history. He has never used smokeless tobacco. He reports that he does not drink alcohol or use drugs.   Family History:  The patient's family history includes Arthritis in his unknown relative; Asthma in his unknown relative; Coronary artery disease in his unknown relative; Heart disease in his unknown relative.    ROS:  Please see the history of present illness.    Otherwise, review of systems are positive for none.   All other systems are reviewed and negative.    PHYSICAL EXAM: VS:  BP 137/72   Pulse 71   Ht 5\' 4"  (1.626 m)   Wt 156 lb 12.8 oz (71.1 kg)   BMI 26.91 kg/m  , BMI Body mass index is 26.91 kg/m. GEN: Well nourished, well developed, in no acute distress  HEENT: normal  Neck: no JVD, carotid bruits, or masses Cardiac: RRR with premature beats; no murmurs, rubs, or gallops,no edema  Respiratory:  clear to auscultation bilaterally, normal work of breathing GI: soft, nontender, nondistended, + BS MS: no deformity or atrophy  Skin: warm and dry, no rash Neuro:  Strength and sensation are intact Psych: euthymic mood, full affect Vascular: Surgical scar in the  right groin with some swelling in the right leg.  The leg is warm.   EKG:  EKG is not ordered today. The ekg ordered today demonstrates normal sinus rhythm with PVCs.   Recent Labs: 06/27/2017: ALT 23 07/03/2017: BUN 11; Creatinine, Ser 1.04; Potassium 4.3; Sodium 139 07/05/2017: Hemoglobin 9.5; Platelets 121    Lipid Panel No results found for: CHOL, TRIG, HDL, CHOLHDL, VLDL, LDLCALC, LDLDIRECT    Wt Readings from Last 3 Encounters:  08/22/17 156 lb 12.8 oz (71.1 kg)  08/07/17 156 lb 1.9 oz (70.8 kg)  07/27/17 151 lb (68.5 kg)      Other studies Reviewed: Additional studies/ records that were reviewed today include:n/a  No flowsheet data found.    ASSESSMENT AND PLAN:  1. PAD with severe claudication and rest pain on the right side, status post successful surgical revascularization.  He continues to have significant claudication affecting the left leg.  Most likely, he will require femoropopliteal bypass on the left.  He is going to follow-up with Dr. Oneida Alar in May.  2. CAD status post CABG: The patient is stable and has no symptoms of angina. Continue aspirin daily.   3. Hyperlipidemia: Continue Atorvastatin with a target LDL <70.   4. Tobacco abuse: He quit  smoking in January.  5.  Essential hypertension: Blood pressure is controlled on current medications.    Disposition:   FU with me in 6 months.   Signed,  [name], MD  08/22/2017 10:34 AM    [place]

## 2017-08-22 NOTE — Patient Instructions (Addendum)
Medication Instructions: Your physician recommends that you continue on your current medications as directed. Please refer to the Current Medication list given to you today.  If you need a refill on your cardiac medications before your next appointment, please call your pharmacy.    Follow-Up: Your physician wants you to follow-up in 6 months with Dr. Fletcher Anon. An Appointment has been made for 02/20/18 at 10: 20   Thank you for choosing [facility] at [facility]!!

## 2017-10-05 ENCOUNTER — Ambulatory Visit (HOSPITAL_COMMUNITY)
Admission: RE | Admit: 2017-10-05 | Discharge: 2017-10-05 | Disposition: A | Discharge status: Home / Self Care | Payer: Medicare Other | Source: Ambulatory Visit | Attending: Vascular Surgery | Admitting: Vascular Surgery

## 2017-10-05 ENCOUNTER — Ambulatory Visit (INDEPENDENT_AMBULATORY_CARE_PROVIDER_SITE_OTHER): Payer: Medicare Other | Admitting: Vascular Surgery

## 2017-10-05 ENCOUNTER — Other Ambulatory Visit: Payer: Self-pay

## 2017-10-05 ENCOUNTER — Encounter: Payer: Self-pay | Admitting: Vascular Surgery

## 2017-10-05 VITALS — BP 131/78 | HR 74 | Temp 97.4°F | Resp 20 | Ht 64.0 in | Wt 155.0 lb

## 2017-10-05 DIAGNOSIS — I25810 Atherosclerosis of coronary artery bypass graft(s) without angina pectoris: Secondary | ICD-10-CM

## 2017-10-05 DIAGNOSIS — E785 Hyperlipidemia, unspecified: Secondary | ICD-10-CM | POA: Diagnosis not present

## 2017-10-05 DIAGNOSIS — Z95828 Presence of other vascular implants and grafts: Secondary | ICD-10-CM | POA: Diagnosis not present

## 2017-10-05 DIAGNOSIS — Z87891 Personal history of nicotine dependence: Secondary | ICD-10-CM | POA: Insufficient documentation

## 2017-10-05 DIAGNOSIS — I739 Peripheral vascular disease, unspecified: Secondary | ICD-10-CM

## 2017-10-05 NOTE — Progress Notes (Signed)
Patient is a 72 year old male who returns for follow-up today.  Was done for 1 block claudication which is now significantly improved.  Did inquire about having his left leg bypass done in the near future.  He states he has a very short distance claudication in the left leg.  He has quit smoking.  He still has some persistent swelling in the right leg but overall the leg is improved.  He denies any incisional drainage.  He underwent right femoral to below-knee popliteal bypass with a distal vein patch on July 03 2017.  Review of systems: He still developed shortness of breath with exercise.  He denies chest pain.  He denies fever or chills.  Physical exam:  Vitals:   10/05/17 1507  BP: 131/78  Pulse: 74  Resp: 20  Temp: (!) 97.4 F (36.3 C)  TempSrc: Oral  SpO2: 96%  Weight: 155 lb (70.3 kg)  Height: 5\' 4"  (1.626 m)    Right lower extremity: Well-healed groin and calf incisions.  Still persistent edema 1-2+ in the right pretibial and foot region foot is pink warm well perfused no open wounds or ulcers.  Foot is too edematous to palpate pulses   Left lower extremity cool no palpable pedal pulses  Data: Patient had bilateral ABIs performed today.  Right side was 1.18 with a toe pressure of 72.  Monophasic waveforms left side was 0.54 toe pressure of 41 unchanged.  He also had a duplex ultrasound which showed the right leg bypass graft was widely patent with triphasic to biphasic flow through this.  Assessment: Patent right lower extremely bypass graft with a improvement of symptoms of claudication in the right leg.  Persistent leg swelling.  Hopefully this will improve with time.  As far as the left leg is concerned the patient would like to have a left lower externally bypass but wishes for his right leg to improve more from a healing standpoint before we proceed.  Plan: The patient will follow-up with me in 3 months time with a repeat duplex of his bypass graft bilateral ABIs and  consideration for left leg bypass if he has improved at that point.  [name], MD [facility] Office: [phone] Pager: [phone]

## 2017-10-31 ENCOUNTER — Other Ambulatory Visit (HOSPITAL_COMMUNITY): Payer: Self-pay | Admitting: Family Medicine

## 2017-10-31 DIAGNOSIS — Z79899 Other long term (current) drug therapy: Secondary | ICD-10-CM

## 2017-11-10 ENCOUNTER — Ambulatory Visit (HOSPITAL_COMMUNITY)
Admission: RE | Admit: 2017-11-10 | Discharge: 2017-11-10 | Disposition: A | Discharge status: Home / Self Care | Payer: Medicare Other | Source: Ambulatory Visit | Attending: Family Medicine | Admitting: Family Medicine

## 2017-11-10 DIAGNOSIS — S72009A Fracture of unspecified part of neck of unspecified femur, initial encounter for closed fracture: Secondary | ICD-10-CM | POA: Diagnosis not present

## 2017-11-10 DIAGNOSIS — Z79899 Other long term (current) drug therapy: Secondary | ICD-10-CM | POA: Diagnosis present

## 2017-11-10 DIAGNOSIS — Z13820 Encounter for screening for osteoporosis: Secondary | ICD-10-CM | POA: Diagnosis not present

## 2017-11-10 DIAGNOSIS — M80019A Age-related osteoporosis with current pathological fracture, unspecified shoulder, initial encounter for fracture: Secondary | ICD-10-CM | POA: Insufficient documentation

## 2017-11-10 DIAGNOSIS — M80039A Age-related osteoporosis with current pathological fracture, unspecified forearm, initial encounter for fracture: Secondary | ICD-10-CM | POA: Insufficient documentation

## 2018-01-11 ENCOUNTER — Ambulatory Visit (INDEPENDENT_AMBULATORY_CARE_PROVIDER_SITE_OTHER)
Admission: RE | Admit: 2018-01-11 | Discharge: 2018-01-11 | Disposition: A | Discharge status: Home / Self Care | Payer: Medicare Other | Source: Ambulatory Visit | Attending: Vascular Surgery | Admitting: Vascular Surgery

## 2018-01-11 ENCOUNTER — Ambulatory Visit (HOSPITAL_COMMUNITY)
Admission: RE | Admit: 2018-01-11 | Discharge: 2018-01-11 | Disposition: A | Discharge status: Home / Self Care | Payer: Medicare Other | Source: Ambulatory Visit | Attending: Vascular Surgery | Admitting: Vascular Surgery

## 2018-01-11 ENCOUNTER — Other Ambulatory Visit: Payer: Self-pay | Admitting: *Deleted

## 2018-01-11 ENCOUNTER — Encounter: Payer: Self-pay | Admitting: *Deleted

## 2018-01-11 ENCOUNTER — Encounter: Payer: Self-pay | Admitting: Vascular Surgery

## 2018-01-11 ENCOUNTER — Ambulatory Visit (INDEPENDENT_AMBULATORY_CARE_PROVIDER_SITE_OTHER): Payer: Medicare Other | Admitting: Vascular Surgery

## 2018-01-11 ENCOUNTER — Other Ambulatory Visit: Payer: Self-pay

## 2018-01-11 VITALS — BP 123/71 | HR 57 | Resp 20 | Ht 64.0 in | Wt 158.5 lb

## 2018-01-11 DIAGNOSIS — I739 Peripheral vascular disease, unspecified: Secondary | ICD-10-CM

## 2018-01-11 DIAGNOSIS — I25810 Atherosclerosis of coronary artery bypass graft(s) without angina pectoris: Secondary | ICD-10-CM | POA: Diagnosis not present

## 2018-01-11 NOTE — H&P (View-Only) (Signed)
Patient had a right femoral to below-knee popliteal bypass with distal vein patch July 03, 2017.  This was done for claudication.  He has very disadvantaged outflow with a very diseased posterior tibial artery distally and would not be a candidate for reconstruction operatively.  His claudication symptoms in his right leg have now completely resolved.  He still has some persistent swelling on the right side.  He still complains of claudication symptoms in the left leg that occurred at about 1 block.  He denies rest pain.  He has no nonhealing wounds.  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  He has continued to refrain from smoking.  Chronic medical problems include asthma arthritis coronary artery disease hypertension all currently stable.  He is on Pletal aspirin and a statin.  Past Medical History:  Diagnosis Date  . Arthritis   . Asthma   . Carpal tunnel syndrome   . Chest pain, unspecified   . Chronic kidney disease    hx of kidney stones  . Coronary artery disease   . Coronary atherosclerosis of artery bypass graft   . GERD (gastroesophageal reflux disease)   . History of kidney stones   . HOH (hard of hearing)   . Hypertension   . 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 Other and unspecified hyperlipidemia   . Personal history of unspecified circulatory disease   . Shortness of breath   . Stroke ([place])   . TIA (transient ischemic attack)    Past Surgical History:  Procedure Laterality Date  . ABDOMINAL AORTOGRAM N/A 06/14/2017   Procedure: ABDOMINAL AORTOGRAM;  Surgeon: Wellington Hampshire, MD;  Location: [facility];  Service: Cardiovascular;  Laterality: N/A;  . BACK SURGERY     cervical disc  . CARDIAC CATHETERIZATION    . CARPAL TUNNEL RELEASE     blateral carpal tunnel  . CORONARY ARTERY BYPASS GRAFT     x5 12 yrs ago  . ENDARTERECTOMY  FEMORAL Right 07/03/2017   Procedure: ENDARTERECTOMY FEMORAL RIGHT;  Surgeon: Elam Dutch, MD;  Location: [facility];  Service: Vascular;  Laterality: Right;  . FEMORAL-POPLITEAL BYPASS GRAFT Right 07/03/2017   Procedure: BYPASS GRAFT FEMORAL-POPLITEAL ARTERY;  Surgeon: Elam Dutch, MD;  Location: [facility];  Service: Vascular;  Laterality: Right;  . LOWER EXTREMITY ANGIOGRAM Bilateral 08/05/2015   Procedure: Lower Extremity Angiogram;  Surgeon: Wellington Hampshire, MD;  Location: [facility];  Service: Cardiovascular;  Laterality: Bilateral;  . LOWER EXTREMITY ANGIOGRAPHY Bilateral 06/14/2017   Procedure: Lower Extremity Angiography;  Surgeon: Wellington Hampshire, MD;  Location: [facility];  Service: Cardiovascular;  Laterality: Bilateral;  . NECK SURGERY    . PATCH ANGIOPLASTY Right 07/03/2017   Procedure: VEIN PATCH ANGIOPLASTY OF FEMORAL AND POSTERIOR TIBIAL ARTERIES;  Surgeon: Elam Dutch, MD;  Location: [facility];  Service: Vascular;  Laterality: Right;  . PERIPHERAL VASCULAR CATHETERIZATION N/A 08/05/2015   Procedure: Abdominal Aortogram;  Surgeon: Wellington Hampshire, MD;  Location: [facility];  Service: Cardiovascular;  Laterality: N/A;  . SHOULDER ARTHROSCOPY W/ ROTATOR CUFF REPAIR     Right    Current Outpatient Medications on File Prior to Visit  Medication Sig Dispense Refill  . albuterol (PROVENTIL HFA;VENTOLIN HFA) 108 (90 Base) MCG/ACT inhaler Inhale 2 puffs into the lungs every 6 (six) hours as needed for wheezing  or shortness of breath.    Marland Kitchen amLODipine (NORVASC) 5 MG tablet Take 1 tablet (5 mg total) by mouth daily. Please call [phone] to schedule annual appointment with Dr. Burt Knack 06-2017 thanks. 90 tablet 3  . aspirin (ASPIRIN LOW DOSE) 81 MG EC tablet Take 81 mg by mouth daily.      Marland Kitchen atorvastatin (LIPITOR) 40 MG tablet TAKE 1 TABLET BY MOUTH  DAILY (Patient taking differently: TAKE 1 TABLET BY MOUTH  IN THE EVENING) 90 tablet 3  . Cholecalciferol 5000 units  TABS Take 5,000 Units by mouth daily.    . cilostazol (PLETAL) 100 MG tablet TAKE 1 TABLET BY MOUTH TWO  TIMES DAILY 180 tablet 3  . dorzolamide (TRUSOPT) 2 % ophthalmic solution     . dorzolamide-timolol (COSOPT) 22.3-6.8 MG/ML ophthalmic solution Place 1 drop into the right eye 2 (two) times daily.     Marland Kitchen HYDROcodone-acetaminophen (NORCO) 5-325 MG tablet Take 1 tablet by mouth every 6 (six) hours as needed for moderate pain. 15 tablet 0  . IRON PO Take 1 tablet by mouth daily.    Marland Kitchen latanoprost (XALATAN) 0.005 % ophthalmic solution Place 1 drop into both eyes at bedtime.     . nitroGLYCERIN (NITROSTAT) 0.4 MG SL tablet Place 1 tablet (0.4 mg total) under the tongue every 5 (five) minutes as needed for chest pain. 25 tablet 3  . omeprazole (PRILOSEC OTC) 20 MG tablet Take 20 mg by mouth daily.      . SYMBICORT 80-4.5 MCG/ACT inhaler     . vitamin B-12 (CYANOCOBALAMIN) 1000 MCG tablet Take 1,000 mcg by mouth daily.    Marland Kitchen zinc gluconate 50 MG tablet Take 50 mg by mouth daily.     No current facility-administered medications on file prior to visit.    Allergies  Allergen Reactions  . Aspirin Other (See Comments)    Has to take the coated aspirin    Physical exam:  Vitals:   01/11/18 1158  BP: 123/71  Pulse: (!) 57  Resp: 20  SpO2: 98%  Weight: 158 lb 8 oz (71.9 kg)  Height: 5\' 4"  (1.626 m)    Neck: No carotid bruits  Chest: Clear to auscultation bilaterally  Cardiac: Regular rate and rhythm  Extremities: Diffusely nonpitting edema right leg no palpable pedal pulses foot pink and warm no ulcers  Left lower extremity 2+ left femoral pulse absent popliteal and pedal pulse left foot is more pale and dusky compared to the right  Data: Patient had a duplex ultrasound of his bypass graft which showed that this was patent with an ABI of 1.1 left side ABI was 0.56.  He also had a vein mapping ultrasound of his left leg today.  This shows a 3 to 4 mm greater saphenous vein to the knee  level.  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  Assessment: Patent bypass graft right leg no residual claudication symptoms still with some edema hopefully will resolve over time.  Patient is now requesting left leg bypass for similar claudication symptoms.  Plan: Patient will be scheduled for left lower extremity arteriogram in a few weeks.  We will then proceed with a left femoropopliteal bypass to the below-knee popliteal artery most likely a few weeks after that.  Risk benefits possible complications of procedure details of femoral-popliteal bypass grafting were explained the patient and his wife today.  They understand and agree to proceed.  They also understand that he is currently not at  risk of limb loss and this will be done for lifestyle limiting claudication symptoms.  [name], MD [facility] Office: [phone] Pager: [phone]

## 2018-01-11 NOTE — Progress Notes (Signed)
Patient had a right femoral to below-knee popliteal bypass with distal vein patch July 03, 2017.  This was done for claudication.  He has very disadvantaged outflow with a very diseased posterior tibial artery distally and would not be a candidate for reconstruction operatively.  His claudication symptoms in his right leg have now completely resolved.  He still has some persistent swelling on the right side.  He still complains of claudication symptoms in the left leg that occurred at about 1 block.  He denies rest pain.  He has no nonhealing wounds.  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  He has continued to refrain from smoking.  Chronic medical problems include asthma arthritis coronary artery disease hypertension all currently stable.  He is on Pletal aspirin and a statin.  Past Medical History:  Diagnosis Date  . Arthritis   . Asthma   . Carpal tunnel syndrome   . Chest pain, unspecified   . Chronic kidney disease    hx of kidney stones  . Coronary artery disease   . Coronary atherosclerosis of artery bypass graft   . GERD (gastroesophageal reflux disease)   . History of kidney stones   . HOH (hard of hearing)   . Hypertension   . 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 Other and unspecified hyperlipidemia   . Personal history of unspecified circulatory disease   . Shortness of breath   . Stroke ([place])   . TIA (transient ischemic attack)    Past Surgical History:  Procedure Laterality Date  . ABDOMINAL AORTOGRAM N/A 06/14/2017   Procedure: ABDOMINAL AORTOGRAM;  Surgeon: Wellington Hampshire, MD;  Location: [facility];  Service: Cardiovascular;  Laterality: N/A;  . BACK SURGERY     cervical disc  . CARDIAC CATHETERIZATION    . CARPAL TUNNEL RELEASE     blateral carpal tunnel  . CORONARY ARTERY BYPASS GRAFT     x5 12 yrs ago  . ENDARTERECTOMY  FEMORAL Right 07/03/2017   Procedure: ENDARTERECTOMY FEMORAL RIGHT;  Surgeon: Elam Dutch, MD;  Location: [facility];  Service: Vascular;  Laterality: Right;  . FEMORAL-POPLITEAL BYPASS GRAFT Right 07/03/2017   Procedure: BYPASS GRAFT FEMORAL-POPLITEAL ARTERY;  Surgeon: Elam Dutch, MD;  Location: [facility];  Service: Vascular;  Laterality: Right;  . LOWER EXTREMITY ANGIOGRAM Bilateral 08/05/2015   Procedure: Lower Extremity Angiogram;  Surgeon: Wellington Hampshire, MD;  Location: [facility];  Service: Cardiovascular;  Laterality: Bilateral;  . LOWER EXTREMITY ANGIOGRAPHY Bilateral 06/14/2017   Procedure: Lower Extremity Angiography;  Surgeon: Wellington Hampshire, MD;  Location: [facility];  Service: Cardiovascular;  Laterality: Bilateral;  . NECK SURGERY    . PATCH ANGIOPLASTY Right 07/03/2017   Procedure: VEIN PATCH ANGIOPLASTY OF FEMORAL AND POSTERIOR TIBIAL ARTERIES;  Surgeon: Elam Dutch, MD;  Location: [facility];  Service: Vascular;  Laterality: Right;  . PERIPHERAL VASCULAR CATHETERIZATION N/A 08/05/2015   Procedure: Abdominal Aortogram;  Surgeon: Wellington Hampshire, MD;  Location: [facility];  Service: Cardiovascular;  Laterality: N/A;  . SHOULDER ARTHROSCOPY W/ ROTATOR CUFF REPAIR     Right    Current Outpatient Medications on File Prior to Visit  Medication Sig Dispense Refill  . albuterol (PROVENTIL HFA;VENTOLIN HFA) 108 (90 Base) MCG/ACT inhaler Inhale 2 puffs into the lungs every 6 (six) hours as needed for wheezing  or shortness of breath.    Marland Kitchen amLODipine (NORVASC) 5 MG tablet Take 1 tablet (5 mg total) by mouth daily. Please call [phone] to schedule annual appointment with Dr. Burt Knack 06-2017 thanks. 90 tablet 3  . aspirin (ASPIRIN LOW DOSE) 81 MG EC tablet Take 81 mg by mouth daily.      Marland Kitchen atorvastatin (LIPITOR) 40 MG tablet TAKE 1 TABLET BY MOUTH  DAILY (Patient taking differently: TAKE 1 TABLET BY MOUTH  IN THE EVENING) 90 tablet 3  . Cholecalciferol 5000 units  TABS Take 5,000 Units by mouth daily.    . cilostazol (PLETAL) 100 MG tablet TAKE 1 TABLET BY MOUTH TWO  TIMES DAILY 180 tablet 3  . dorzolamide (TRUSOPT) 2 % ophthalmic solution     . dorzolamide-timolol (COSOPT) 22.3-6.8 MG/ML ophthalmic solution Place 1 drop into the right eye 2 (two) times daily.     Marland Kitchen HYDROcodone-acetaminophen (NORCO) 5-325 MG tablet Take 1 tablet by mouth every 6 (six) hours as needed for moderate pain. 15 tablet 0  . IRON PO Take 1 tablet by mouth daily.    Marland Kitchen latanoprost (XALATAN) 0.005 % ophthalmic solution Place 1 drop into both eyes at bedtime.     . nitroGLYCERIN (NITROSTAT) 0.4 MG SL tablet Place 1 tablet (0.4 mg total) under the tongue every 5 (five) minutes as needed for chest pain. 25 tablet 3  . omeprazole (PRILOSEC OTC) 20 MG tablet Take 20 mg by mouth daily.      . SYMBICORT 80-4.5 MCG/ACT inhaler     . vitamin B-12 (CYANOCOBALAMIN) 1000 MCG tablet Take 1,000 mcg by mouth daily.    Marland Kitchen zinc gluconate 50 MG tablet Take 50 mg by mouth daily.     No current facility-administered medications on file prior to visit.    Allergies  Allergen Reactions  . Aspirin Other (See Comments)    Has to take the coated aspirin    Physical exam:  Vitals:   01/11/18 1158  BP: 123/71  Pulse: (!) 57  Resp: 20  SpO2: 98%  Weight: 158 lb 8 oz (71.9 kg)  Height: 5\' 4"  (1.626 m)    Neck: No carotid bruits  Chest: Clear to auscultation bilaterally  Cardiac: Regular rate and rhythm  Extremities: Diffusely nonpitting edema right leg no palpable pedal pulses foot pink and warm no ulcers  Left lower extremity 2+ left femoral pulse absent popliteal and pedal pulse left foot is more pale and dusky compared to the right  Data: Patient had a duplex ultrasound of his bypass graft which showed that this was patent with an ABI of 1.1 left side ABI was 0.56.  He also had a vein mapping ultrasound of his left leg today.  This shows a 3 to 4 mm greater saphenous vein to the knee  level.  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  Assessment: Patent bypass graft right leg no residual claudication symptoms still with some edema hopefully will resolve over time.  Patient is now requesting left leg bypass for similar claudication symptoms.  Plan: Patient will be scheduled for left lower extremity arteriogram in a few weeks.  We will then proceed with a left femoropopliteal bypass to the below-knee popliteal artery most likely a few weeks after that.  Risk benefits possible complications of procedure details of femoral-popliteal bypass grafting were explained the patient and his wife today.  They understand and agree to proceed.  They also understand that he is currently not at  risk of limb loss and this will be done for lifestyle limiting claudication symptoms.  [name], MD [facility] Office: [phone] Pager: [phone]

## 2018-01-26 ENCOUNTER — Ambulatory Visit (HOSPITAL_COMMUNITY)
Admission: RE | Admit: 2018-01-26 | Discharge: 2018-01-26 | Disposition: A | Discharge status: Home / Self Care | Payer: Medicare Other | Source: Ambulatory Visit | Attending: Vascular Surgery | Admitting: Vascular Surgery

## 2018-01-26 ENCOUNTER — Other Ambulatory Visit: Payer: Self-pay

## 2018-01-26 ENCOUNTER — Encounter (HOSPITAL_COMMUNITY): Payer: Self-pay

## 2018-01-26 ENCOUNTER — Encounter (HOSPITAL_COMMUNITY)
Admission: RE | Disposition: A | Discharge status: Home / Self Care | Payer: Self-pay | Source: Ambulatory Visit | Attending: Vascular Surgery

## 2018-01-26 DIAGNOSIS — Z886 Allergy status to analgesic agent status: Secondary | ICD-10-CM | POA: Insufficient documentation

## 2018-01-26 DIAGNOSIS — Z951 Presence of aortocoronary bypass graft: Secondary | ICD-10-CM | POA: Diagnosis not present

## 2018-01-26 DIAGNOSIS — J45909 Unspecified asthma, uncomplicated: Secondary | ICD-10-CM | POA: Insufficient documentation

## 2018-01-26 DIAGNOSIS — I70213 Atherosclerosis of native arteries of extremities with intermittent claudication, bilateral legs: Secondary | ICD-10-CM | POA: Insufficient documentation

## 2018-01-26 DIAGNOSIS — Z87442 Personal history of urinary calculi: Secondary | ICD-10-CM | POA: Insufficient documentation

## 2018-01-26 DIAGNOSIS — E785 Hyperlipidemia, unspecified: Secondary | ICD-10-CM | POA: Diagnosis not present

## 2018-01-26 DIAGNOSIS — Z8679 Personal history of other diseases of the circulatory system: Secondary | ICD-10-CM | POA: Insufficient documentation

## 2018-01-26 DIAGNOSIS — I129 Hypertensive chronic kidney disease with stage 1 through stage 4 chronic kidney disease, or unspecified chronic kidney disease: Secondary | ICD-10-CM | POA: Insufficient documentation

## 2018-01-26 DIAGNOSIS — K219 Gastro-esophageal reflux disease without esophagitis: Secondary | ICD-10-CM | POA: Diagnosis not present

## 2018-01-26 DIAGNOSIS — Z9889 Other specified postprocedural states: Secondary | ICD-10-CM | POA: Diagnosis not present

## 2018-01-26 DIAGNOSIS — M1990 Unspecified osteoarthritis, unspecified site: Secondary | ICD-10-CM | POA: Insufficient documentation

## 2018-01-26 DIAGNOSIS — H9190 Unspecified hearing loss, unspecified ear: Secondary | ICD-10-CM | POA: Diagnosis not present

## 2018-01-26 DIAGNOSIS — Z8673 Personal history of transient ischemic attack (TIA), and cerebral infarction without residual deficits: Secondary | ICD-10-CM | POA: Diagnosis not present

## 2018-01-26 DIAGNOSIS — Z7951 Long term (current) use of inhaled steroids: Secondary | ICD-10-CM | POA: Insufficient documentation

## 2018-01-26 DIAGNOSIS — Z79899 Other long term (current) drug therapy: Secondary | ICD-10-CM | POA: Diagnosis not present

## 2018-01-26 DIAGNOSIS — Z7982 Long term (current) use of aspirin: Secondary | ICD-10-CM | POA: Insufficient documentation

## 2018-01-26 DIAGNOSIS — N189 Chronic kidney disease, unspecified: Secondary | ICD-10-CM | POA: Diagnosis not present

## 2018-01-26 DIAGNOSIS — G5600 Carpal tunnel syndrome, unspecified upper limb: Secondary | ICD-10-CM | POA: Insufficient documentation

## 2018-01-26 HISTORY — PX: LOWER EXTREMITY ANGIOGRAPHY: CATH118251

## 2018-01-26 HISTORY — PX: ABDOMINAL AORTOGRAM: CATH118222

## 2018-01-26 LAB — POCT I-STAT, CHEM 8
BUN: 8 mg/dL (ref 8–23)
CREATININE: 0.9 mg/dL (ref 0.61–1.24)
Calcium, Ion: 1.1 mmol/L — ABNORMAL LOW (ref 1.15–1.40)
Chloride: 106 mmol/L (ref 98–111)
Glucose, Bld: 74 mg/dL (ref 70–99)
HEMATOCRIT: 42 % (ref 39.0–52.0)
Hemoglobin: 14.3 g/dL (ref 13.0–17.0)
Potassium: 3.7 mmol/L (ref 3.5–5.1)
SODIUM: 140 mmol/L (ref 135–145)
TCO2: 21 mmol/L — ABNORMAL LOW (ref 22–32)

## 2018-01-26 SURGERY — LOWER EXTREMITY ANGIOGRAPHY
Anesthesia: LOCAL

## 2018-01-26 MED ORDER — OXYCODONE HCL 5 MG PO TABS: 5.0000 mg | ORAL_TABLET | ORAL | Status: DC | PRN

## 2018-01-26 MED ORDER — SODIUM CHLORIDE 0.9 % IV SOLN: INTRAVENOUS | Status: AC

## 2018-01-26 MED ORDER — LIDOCAINE HCL (PF) 1 % IJ SOLN
INTRAMUSCULAR | Status: DC | PRN
  Administered 2018-01-26: 12 mL

## 2018-01-26 MED ORDER — SODIUM CHLORIDE 0.9 % IV SOLN: 250.0000 mL | INTRAVENOUS | Status: DC | PRN

## 2018-01-26 MED ORDER — IODIXANOL 320 MG/ML IV SOLN
INTRAVENOUS | Status: DC | PRN
  Administered 2018-01-26: 150 mL via INTRAVENOUS

## 2018-01-26 MED ORDER — LABETALOL HCL 5 MG/ML IV SOLN: 10.0000 mg | INTRAVENOUS | Status: DC | PRN

## 2018-01-26 MED ORDER — HEPARIN (PORCINE) IN NACL 1000-0.9 UT/500ML-% IV SOLN
INTRAVENOUS | Status: AC
  Filled 2018-01-26: qty 500

## 2018-01-26 MED ORDER — SODIUM CHLORIDE 0.9 % IV SOLN
INTRAVENOUS | Status: DC
  Administered 2018-01-26: 07:00:00 via INTRAVENOUS

## 2018-01-26 MED ORDER — SODIUM CHLORIDE 0.9% FLUSH: 3.0000 mL | INTRAVENOUS | Status: DC | PRN

## 2018-01-26 MED ORDER — HEPARIN (PORCINE) IN NACL 1000-0.9 UT/500ML-% IV SOLN
INTRAVENOUS | Status: DC | PRN
  Administered 2018-01-26 (×2): 500 mL

## 2018-01-26 MED ORDER — LIDOCAINE HCL (PF) 1 % IJ SOLN
INTRAMUSCULAR | Status: AC
  Filled 2018-01-26: qty 30

## 2018-01-26 MED ORDER — ONDANSETRON HCL 4 MG/2ML IJ SOLN: 4.0000 mg | Freq: Four times a day (QID) | INTRAMUSCULAR | Status: DC | PRN

## 2018-01-26 MED ORDER — SODIUM CHLORIDE 0.9% FLUSH: 3.0000 mL | Freq: Two times a day (BID) | INTRAVENOUS | Status: DC

## 2018-01-26 MED ORDER — HYDRALAZINE HCL 20 MG/ML IJ SOLN: 5.0000 mg | INTRAMUSCULAR | Status: DC | PRN

## 2018-01-26 MED ORDER — ACETAMINOPHEN 325 MG PO TABS: 650.0000 mg | ORAL_TABLET | ORAL | Status: DC | PRN

## 2018-01-26 MED ORDER — MORPHINE SULFATE (PF) 10 MG/ML IV SOLN: 2.0000 mg | INTRAVENOUS | Status: DC | PRN

## 2018-01-26 SURGICAL SUPPLY — 8 items
CATH ANGIO 5F PIGTAIL 65CM (CATHETERS) ×3 IMPLANT
KIT PV (KITS) ×3 IMPLANT
SHEATH PINNACLE 5F 10CM (SHEATH) ×3 IMPLANT
SHEATH PROBE COVER 6X72 (BAG) ×3 IMPLANT
SYR MEDRAD MARK V 150ML (SYRINGE) ×3 IMPLANT
TRANSDUCER W/STOPCOCK (MISCELLANEOUS) ×3 IMPLANT
TRAY PV CATH (CUSTOM PROCEDURE TRAY) ×3 IMPLANT
WIRE HITORQ VERSACORE ST 145CM (WIRE) ×3 IMPLANT

## 2018-01-26 NOTE — Discharge Instructions (Signed)

## 2018-01-26 NOTE — Progress Notes (Signed)
Site area: left groin fa sheath pulled and pressure held by Izetta Dakin Site Prior to Removal:  Level 0 Pressure Applied For:  20 minutes Manual:   yes Patient Status During Pull:  stable Post Pull Site:  Level  0 Post Pull Instructions Given:  yes Post Pull Pulses Present: dopplered Dressing Applied:  Gauze and tegaderm Bedrest begins @ 1130 Comments:

## 2018-01-26 NOTE — Op Note (Signed)
Procedure: Abdominal aortogram with bilateral lower extremity runoff  Preoperative diagnosis: Claudication  Postoperative diagnosis: Same  Anesthesia: Local  Operative findings: #1 patent right femoral to posterior tibial artery bypass one-vessel runoff by the PT to the right foot  2.  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  The patient was placed in supine position Angio table.  Both groins were prepped and draped in usual sterile fashion.  Local anesthesia was inflated over the left common femoral artery.  Ultrasound was used to identify the left common femoral artery and femoral bifurcation.  The left common femoral artery was patent.  The left superficial femoral artery appeared to be occluded.  The common femoral artery was moderately calcified.  A permanent image was obtained to be stored in the patient's medical record.  At this point an introducer needle was used to cannulate the left common femoral artery and an 035 versacore wire threaded up the abdominal aorta under fluoroscopic guidance.  A 5 French sheath was then placed in the common femoral artery on the left side.  This was thoroughly flushed with heparin I saline.  A 5 French pigtail catheter was advanced over the guidewire in the abdominal aorta and abdominal aortogram was obtained in AP projection.  The left and right renal arteries are widely patent.  The infrarenal abdominal aorta is patent.  The left and right common external and internal iliac arteries are all patent.  This point the pigtail catheter was pulled down just above the aortic bifurcation and bilateral lower extremity runoff views were obtained.  In the right  lower extremity, the right common femoral artery is patent.  The right profunda femoris artery is diseased and small but patent.  There is a bypass graft extending from the right common femoral artery to the midportion of the right posterior tibial artery.  This is patent.  The native right superficial femoral artery popliteal artery and tibial artery origins are all occluded.  There is one-vessel posterior tibial runoff to the right foot.  In the left lower extremity the left common femoral artery is patent.  The left superficial femoral artery is occluded.  The above-knee popliteal artery does reconstitute via profunda collaterals but it is heavily diseased and not a suitable bypass target.  The left profunda is patent.  The below-knee popliteal artery is calcified but small and patent.  Anterior tibial artery is occluded.  Tibioperoneal trunk posterior tibial and peroneal arteries are all patent.  The peroneal artery is quite small.  Posterior tibial artery is also small and is the primary dominant runoff vessel to the foot.  He does not reconstitute a pedal arch.  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  At this point the pigtail catheter was removed over guidewire and additional views of the left lower extremity were obtained through the sheath to confirm the above findings.  The patient tired the procedure well and there were no complications.  The patient was taken to the holding area in stable condition.  The sheath will be removed in the holding area.  Operative management: The patient will be scheduled in the near future for a left femoral to below-knee popliteal bypass.  [name],  MD [facility] Office: [phone] Pager: [phone]

## 2018-01-26 NOTE — Interval H&P Note (Signed)
History and Physical Interval Note:  01/26/2018 9:34 AM  Devin [name]  has presented today for surgery, with the diagnosis of pad  The various methods of treatment have been discussed with the patient and family. After consideration of risks, benefits and other options for treatment, the patient has consented to  Procedure(s): LOWER EXTREMITY ANGIOGRAPHY (Left) as a surgical intervention .  The patient's history has been reviewed, patient examined, no change in status, stable for surgery.  I have reviewed the patient's chart and labs.  Questions were answered to the patient's satisfaction.     [name]

## 2018-01-29 ENCOUNTER — Encounter (HOSPITAL_COMMUNITY): Payer: Self-pay | Admitting: Vascular Surgery

## 2018-02-02 NOTE — Pre-Procedure Instructions (Signed)
Devin [name]  02/02/2018      [facility] - [place], [place] - [address] [place] [address] Phone: [phone] Fax: [phone]  [place], [place] [facility] [address] [place] [address] [place] [address] Phone: [phone] Fax: [phone]    Your procedure is scheduled on February 12, 2018.  Report to [facility] Admitting at 530 AM.  Call this number if you have problems the morning of surgery:  [phone]   Remember:  Do not eat or drink after midnight.    Take these medicines the morning of surgery with A SIP OF WATER  Albuterol inhaler-if needed (bring inhaler with you) Amlodipine (norvasc) Cosopt eye drops Xalatan eye drops Nitrostat-if needed for chest pain Omeprazole (prilosec) Symbicort inhaler  Follow your surgeon's instructions on when to hold/resume aspirin.  If no instructions were given call the office to determine how they would like to you take aspirin  7 days prior to surgery STOP taking any Aleve, Naproxen, Ibuprofen, Motrin, Advil, Goody's, BC's, all herbal medications, fish oil, and all vitamins    Do not wear jewelry  Do not wear lotions, powders, or colognes, or deodorant.  Men may shave face and neck.  Do not bring valuables to the hospital.  [facility] is not responsible for any belongings or valuables.  Contacts, dentures or bridgework may not be worn into surgery.  Leave your suitcase in the car.  After surgery it may be brought to your room.  For patients admitted to the hospital, discharge time will be determined by your treatment team.  Patients discharged the day of surgery will not be allowed to drive home.   [facility]- Preparing For Surgery  Before surgery, you can play an important role. Because skin is not sterile, your skin needs to be as free of germs as possible. You can reduce the number of germs on your skin by washing with CHG  (chlorahexidine gluconate) Soap before surgery.  CHG is an antiseptic cleaner which kills germs and bonds with the skin to continue killing germs even after washing.    Oral Hygiene is also important to reduce your risk of infection.  Remember - BRUSH YOUR TEETH THE MORNING OF SURGERY WITH YOUR REGULAR TOOTHPASTE  Please do not use if you have an allergy to CHG or antibacterial soaps. If your skin becomes reddened/irritated stop using the CHG.  Do not shave (including legs and underarms) for at least 48 hours prior to first CHG shower. It is OK to shave your face.  Please follow these instructions carefully.   1. Shower the NIGHT BEFORE SURGERY and the MORNING OF SURGERY with CHG.   2. If you chose to wash your hair, wash your hair first as usual with your normal shampoo.  3. After you shampoo, rinse your hair and body thoroughly to remove the shampoo.  4. Use CHG as you would any other liquid soap. You can apply CHG directly to the skin and wash gently with a scrungie or a clean washcloth.   5. Apply the CHG Soap to your body ONLY FROM THE NECK DOWN.  Do not use on open wounds or open sores. Avoid contact with your eyes, ears, mouth and genitals (private parts). Wash Face and genitals (private parts)  with your normal soap.  6. Wash thoroughly, paying special attention to the area where your surgery will be performed.  7. Thoroughly  rinse your body with warm water from the neck down.  8. DO NOT shower/wash with your normal soap after using and rinsing off the CHG Soap.  9. Pat yourself dry with a CLEAN TOWEL.  10. Wear CLEAN PAJAMAS to bed the night before surgery, wear comfortable clothes the morning of surgery  11. Place CLEAN SHEETS on your bed the night of your first shower and DO NOT SLEEP WITH PETS.  Day of Surgery:  Do not apply any deodorants/lotions.  Please wear clean clothes to the hospital/surgery center.   Remember to brush your teeth WITH YOUR REGULAR  TOOTHPASTE.  Please read over the following fact sheets that you were given. Pain Booklet, Coughing and Deep Breathing, MRSA Information and Surgical Site Infection Prevention

## 2018-02-05 ENCOUNTER — Encounter (HOSPITAL_COMMUNITY): Payer: Self-pay

## 2018-02-05 ENCOUNTER — Encounter (HOSPITAL_COMMUNITY)
Admission: RE | Admit: 2018-02-05 | Discharge: 2018-02-05 | Disposition: A | Discharge status: Home / Self Care | Payer: Medicare Other | Source: Ambulatory Visit | Attending: Vascular Surgery | Admitting: Vascular Surgery

## 2018-02-05 DIAGNOSIS — Z01812 Encounter for preprocedural laboratory examination: Secondary | ICD-10-CM | POA: Diagnosis not present

## 2018-02-05 LAB — URINALYSIS, ROUTINE W REFLEX MICROSCOPIC
BILIRUBIN URINE: NEGATIVE
Glucose, UA: NEGATIVE mg/dL
Hgb urine dipstick: NEGATIVE
Ketones, ur: 15 mg/dL — AB
Leukocytes, UA: NEGATIVE
NITRITE: NEGATIVE
PROTEIN: NEGATIVE mg/dL
SPECIFIC GRAVITY, URINE: 1.02 (ref 1.005–1.030)
pH: 7 (ref 5.0–8.0)

## 2018-02-05 LAB — PROTIME-INR
INR: 1.01
Prothrombin Time: 13.2 seconds (ref 11.4–15.2)

## 2018-02-05 LAB — SURGICAL PCR SCREEN
MRSA, PCR: NEGATIVE
Staphylococcus aureus: NEGATIVE

## 2018-02-05 LAB — COMPREHENSIVE METABOLIC PANEL
ALBUMIN: 3.3 g/dL — AB (ref 3.5–5.0)
ALK PHOS: 120 U/L (ref 38–126)
ALT: 30 U/L (ref 0–44)
AST: 32 U/L (ref 15–41)
Anion gap: 9 (ref 5–15)
BILIRUBIN TOTAL: 0.5 mg/dL (ref 0.3–1.2)
BUN: 7 mg/dL — AB (ref 8–23)
CALCIUM: 8.7 mg/dL — AB (ref 8.9–10.3)
CO2: 23 mmol/L (ref 22–32)
Chloride: 108 mmol/L (ref 98–111)
Creatinine, Ser: 0.89 mg/dL (ref 0.61–1.24)
GFR calc Af Amer: >60 mL/min (ref >60)
GFR calc non Af Amer: >60 mL/min (ref >60)
GLUCOSE: 86 mg/dL (ref 70–99)
Potassium: 3.6 mmol/L (ref 3.5–5.1)
Sodium: 140 mmol/L (ref 135–145)
TOTAL PROTEIN: 6.5 g/dL (ref 6.5–8.1)

## 2018-02-05 LAB — APTT: aPTT: 37 seconds — ABNORMAL HIGH (ref 24–36)

## 2018-02-05 LAB — TYPE AND SCREEN
ABO/RH(D): O POS
ANTIBODY SCREEN: NEGATIVE

## 2018-02-05 LAB — CBC
HEMATOCRIT: 42.4 % (ref 39.0–52.0)
HEMOGLOBIN: 13.1 g/dL (ref 13.0–17.0)
MCH: 30.8 pg (ref 26.0–34.0)
MCHC: 30.9 g/dL (ref 30.0–36.0)
MCV: 99.8 fL (ref 78.0–100.0)
Platelets: 150 10*3/uL (ref 150–400)
RBC: 4.25 MIL/uL (ref 4.22–5.81)
RDW: 13.4 % (ref 11.5–15.5)
WBC: 4 10*3/uL (ref 4.0–10.5)

## 2018-02-05 MED ORDER — CHLORHEXIDINE GLUCONATE 4 % EX LIQD: 60.0000 mL | Freq: Once | CUTANEOUS | Status: DC

## 2018-02-11 NOTE — Anesthesia Preprocedure Evaluation (Addendum)
Anesthesia Evaluation  Patient identified by MRN, date of birth, ID band Patient awake    Reviewed: Allergy & Precautions, NPO status , Patient's Chart, lab work & pertinent test results  History of Anesthesia Complications Negative for: history of anesthetic complications  Airway Mallampati: I  TM Distance: >3 FB Neck ROM: Full    Dental  (+) Edentulous Upper, Edentulous Lower, Dental Advisory Given   Pulmonary asthma , former smoker,    Pulmonary exam normal        Cardiovascular hypertension, Pt. on medications + CAD and + CABG  Normal cardiovascular exam  Study Highlights     Nuclear stress EF: 55%.  Blood pressure demonstrated a normal response to exercise.  There was no ST segment deviation noted during stress.  The study is normal.  This is a low risk study.  The left ventricular ejection fraction is normal (55-65%).   Normal resting and stress perfusion. No ischemia or infarction EF 55%     Neuro/Psych TIAnegative psych ROS   GI/Hepatic Neg liver ROS, GERD  Medicated,  Endo/Other  negative endocrine ROS  Renal/GU negative Renal ROS     Musculoskeletal   Abdominal   Peds  Hematology   Anesthesia Other Findings   Reproductive/Obstetrics                           Anesthesia Physical Anesthesia Plan  ASA: III  Anesthesia Plan: General   Post-op Pain Management:    Induction: Intravenous  PONV Risk Score and Plan: 3 and Ondansetron, Dexamethasone and Diphenhydramine  Airway Management Planned: Oral ETT  Additional Equipment:   Intra-op Plan:   Post-operative Plan: Extubation in OR  Informed Consent: I have reviewed the patients History and Physical, chart, labs and discussed the procedure including the risks, benefits and alternatives for the proposed anesthesia with the patient or authorized representative who has indicated his/her understanding and  acceptance.   Dental advisory given  Plan Discussed with: Anesthesiologist and CRNA  Anesthesia Plan Comments:        Anesthesia Quick Evaluation

## 2018-02-12 ENCOUNTER — Inpatient Hospital Stay (HOSPITAL_COMMUNITY): Payer: Medicare Other | Admitting: Anesthesiology

## 2018-02-12 ENCOUNTER — Inpatient Hospital Stay (HOSPITAL_COMMUNITY)
Admission: RE | Admit: 2018-02-12 | Discharge: 2018-02-13 | DRG: 253 | Disposition: A | Discharge status: Home Health | Payer: Medicare Other | Attending: Vascular Surgery | Admitting: Vascular Surgery

## 2018-02-12 ENCOUNTER — Encounter (HOSPITAL_COMMUNITY)
Admission: RE | Disposition: A | Discharge status: Home Health | Payer: Self-pay | Source: Home / Self Care | Attending: Vascular Surgery

## 2018-02-12 ENCOUNTER — Encounter (HOSPITAL_COMMUNITY): Payer: Self-pay

## 2018-02-12 ENCOUNTER — Other Ambulatory Visit: Payer: Self-pay

## 2018-02-12 DIAGNOSIS — I739 Peripheral vascular disease, unspecified: Secondary | ICD-10-CM | POA: Diagnosis present

## 2018-02-12 DIAGNOSIS — Z8673 Personal history of transient ischemic attack (TIA), and cerebral infarction without residual deficits: Secondary | ICD-10-CM | POA: Diagnosis not present

## 2018-02-12 DIAGNOSIS — Z79899 Other long term (current) drug therapy: Secondary | ICD-10-CM | POA: Diagnosis not present

## 2018-02-12 DIAGNOSIS — K219 Gastro-esophageal reflux disease without esophagitis: Secondary | ICD-10-CM | POA: Diagnosis present

## 2018-02-12 DIAGNOSIS — Z23 Encounter for immunization: Secondary | ICD-10-CM | POA: Diagnosis present

## 2018-02-12 DIAGNOSIS — E785 Hyperlipidemia, unspecified: Secondary | ICD-10-CM | POA: Diagnosis present

## 2018-02-12 DIAGNOSIS — Z7982 Long term (current) use of aspirin: Secondary | ICD-10-CM | POA: Diagnosis not present

## 2018-02-12 DIAGNOSIS — Z886 Allergy status to analgesic agent status: Secondary | ICD-10-CM | POA: Diagnosis not present

## 2018-02-12 DIAGNOSIS — Z7951 Long term (current) use of inhaled steroids: Secondary | ICD-10-CM

## 2018-02-12 DIAGNOSIS — I25810 Atherosclerosis of coronary artery bypass graft(s) without angina pectoris: Secondary | ICD-10-CM | POA: Diagnosis present

## 2018-02-12 DIAGNOSIS — I771 Stricture of artery: Secondary | ICD-10-CM | POA: Diagnosis present

## 2018-02-12 DIAGNOSIS — I10 Essential (primary) hypertension: Secondary | ICD-10-CM | POA: Diagnosis present

## 2018-02-12 DIAGNOSIS — I70212 Atherosclerosis of native arteries of extremities with intermittent claudication, left leg: Secondary | ICD-10-CM

## 2018-02-12 DIAGNOSIS — Z87442 Personal history of urinary calculi: Secondary | ICD-10-CM

## 2018-02-12 DIAGNOSIS — J45909 Unspecified asthma, uncomplicated: Secondary | ICD-10-CM | POA: Diagnosis present

## 2018-02-12 DIAGNOSIS — H9190 Unspecified hearing loss, unspecified ear: Secondary | ICD-10-CM | POA: Diagnosis present

## 2018-02-12 DIAGNOSIS — Z8679 Personal history of other diseases of the circulatory system: Secondary | ICD-10-CM

## 2018-02-12 DIAGNOSIS — D62 Acute posthemorrhagic anemia: Secondary | ICD-10-CM | POA: Diagnosis not present

## 2018-02-12 HISTORY — PX: FEMORAL-POPLITEAL BYPASS GRAFT: SHX937

## 2018-02-12 HISTORY — PX: VEIN HARVEST: SHX6363

## 2018-02-12 LAB — CBC
HCT: 37.2 % — ABNORMAL LOW (ref 39.0–52.0)
HEMOGLOBIN: 12 g/dL — AB (ref 13.0–17.0)
MCH: 31.6 pg (ref 26.0–34.0)
MCHC: 32.3 g/dL (ref 30.0–36.0)
MCV: 97.9 fL (ref 78.0–100.0)
Platelets: 150 10*3/uL (ref 150–400)
RBC: 3.8 MIL/uL — AB (ref 4.22–5.81)
RDW: 14.1 % (ref 11.5–15.5)
WBC: 7.5 10*3/uL (ref 4.0–10.5)

## 2018-02-12 LAB — CREATININE, SERUM: Creatinine, Ser: 1.05 mg/dL (ref 0.61–1.24)

## 2018-02-12 SURGERY — BYPASS GRAFT FEMORAL-POPLITEAL ARTERY
Anesthesia: General | Site: Leg Upper | Laterality: Left

## 2018-02-12 MED ORDER — ONDANSETRON HCL 4 MG/2ML IJ SOLN
INTRAMUSCULAR | Status: DC | PRN
  Administered 2018-02-12: 4 mg via INTRAVENOUS

## 2018-02-12 MED ORDER — MIDAZOLAM HCL 2 MG/2ML IJ SOLN
INTRAMUSCULAR | Status: AC
  Filled 2018-02-12: qty 2

## 2018-02-12 MED ORDER — ROCURONIUM BROMIDE 50 MG/5ML IV SOSY
PREFILLED_SYRINGE | INTRAVENOUS | Status: AC
  Filled 2018-02-12: qty 5

## 2018-02-12 MED ORDER — VITAMIN D3 25 MCG (1000 UNIT) PO TABS
5000.0000 [IU] | ORAL_TABLET | Freq: Every day | ORAL | Status: DC
  Administered 2018-02-12 – 2018-02-13 (×2): 5000 [IU] via ORAL
  Filled 2018-02-12 (×4): qty 5

## 2018-02-12 MED ORDER — POLYETHYLENE GLYCOL 3350 17 G PO PACK: 17.0000 g | PACK | Freq: Every day | ORAL | Status: DC | PRN

## 2018-02-12 MED ORDER — MAGNESIUM SULFATE 2 GM/50ML IV SOLN: 2.0000 g | Freq: Every day | INTRAVENOUS | Status: DC | PRN

## 2018-02-12 MED ORDER — FENTANYL CITRATE (PF) 250 MCG/5ML IJ SOLN
INTRAMUSCULAR | Status: AC
  Filled 2018-02-12: qty 5

## 2018-02-12 MED ORDER — CEFAZOLIN SODIUM-DEXTROSE 2-4 GM/100ML-% IV SOLN
INTRAVENOUS | Status: AC
  Filled 2018-02-12: qty 100

## 2018-02-12 MED ORDER — ONDANSETRON HCL 4 MG/2ML IJ SOLN: 4.0000 mg | Freq: Four times a day (QID) | INTRAMUSCULAR | Status: DC | PRN

## 2018-02-12 MED ORDER — PHENYLEPHRINE 40 MCG/ML (10ML) SYRINGE FOR IV PUSH (FOR BLOOD PRESSURE SUPPORT)
PREFILLED_SYRINGE | INTRAVENOUS | Status: AC
  Filled 2018-02-12: qty 10

## 2018-02-12 MED ORDER — MORPHINE SULFATE (PF) 2 MG/ML IV SOLN: 2.0000 mg | INTRAVENOUS | Status: DC | PRN

## 2018-02-12 MED ORDER — SODIUM CHLORIDE 0.9 % IV SOLN
INTRAVENOUS | Status: AC
  Filled 2018-02-12: qty 1.2

## 2018-02-12 MED ORDER — DEXAMETHASONE SODIUM PHOSPHATE 10 MG/ML IJ SOLN
INTRAMUSCULAR | Status: AC
  Filled 2018-02-12: qty 1

## 2018-02-12 MED ORDER — PROPOFOL 10 MG/ML IV BOLUS
INTRAVENOUS | Status: AC
  Filled 2018-02-12: qty 20

## 2018-02-12 MED ORDER — CILOSTAZOL 100 MG PO TABS
100.0000 mg | ORAL_TABLET | Freq: Two times a day (BID) | ORAL | Status: DC
  Administered 2018-02-12 – 2018-02-13 (×3): 100 mg via ORAL
  Filled 2018-02-12 (×3): qty 1

## 2018-02-12 MED ORDER — POTASSIUM CHLORIDE CRYS ER 20 MEQ PO TBCR: 20.0000 meq | EXTENDED_RELEASE_TABLET | Freq: Every day | ORAL | Status: DC | PRN

## 2018-02-12 MED ORDER — SODIUM CHLORIDE 0.9 % IV SOLN
INTRAVENOUS | Status: DC
  Administered 2018-02-12: 15:00:00 via INTRAVENOUS

## 2018-02-12 MED ORDER — FLUTICASONE FUROATE-VILANTEROL 100-25 MCG/INH IN AEPB
1.0000 | INHALATION_SPRAY | Freq: Every day | RESPIRATORY_TRACT | Status: DC
  Filled 2018-02-12: qty 28

## 2018-02-12 MED ORDER — SODIUM CHLORIDE 0.9 % IV SOLN: 500.0000 mL | Freq: Once | INTRAVENOUS | Status: DC | PRN

## 2018-02-12 MED ORDER — SUGAMMADEX SODIUM 200 MG/2ML IV SOLN
INTRAVENOUS | Status: DC | PRN
  Administered 2018-02-12: 145.2 mg via INTRAVENOUS

## 2018-02-12 MED ORDER — DOCUSATE SODIUM 100 MG PO CAPS
100.0000 mg | ORAL_CAPSULE | Freq: Every day | ORAL | Status: DC
  Administered 2018-02-13: 100 mg via ORAL
  Filled 2018-02-12: qty 1

## 2018-02-12 MED ORDER — LABETALOL HCL 5 MG/ML IV SOLN: 10.0000 mg | INTRAVENOUS | Status: DC | PRN

## 2018-02-12 MED ORDER — ATORVASTATIN CALCIUM 40 MG PO TABS
40.0000 mg | ORAL_TABLET | Freq: Every evening | ORAL | Status: DC
  Administered 2018-02-12: 40 mg via ORAL
  Filled 2018-02-12: qty 1

## 2018-02-12 MED ORDER — METOPROLOL TARTRATE 5 MG/5ML IV SOLN: 2.0000 mg | INTRAVENOUS | Status: DC | PRN

## 2018-02-12 MED ORDER — DEXAMETHASONE SODIUM PHOSPHATE 10 MG/ML IJ SOLN
INTRAMUSCULAR | Status: DC | PRN
  Administered 2018-02-12: 10 mg via INTRAVENOUS

## 2018-02-12 MED ORDER — HYDRALAZINE HCL 20 MG/ML IJ SOLN: 5.0000 mg | INTRAMUSCULAR | Status: DC | PRN

## 2018-02-12 MED ORDER — HYDROMORPHONE HCL 1 MG/ML IJ SOLN: 0.2500 mg | INTRAMUSCULAR | Status: DC | PRN

## 2018-02-12 MED ORDER — ONDANSETRON HCL 4 MG/2ML IJ SOLN
INTRAMUSCULAR | Status: AC
  Filled 2018-02-12: qty 2

## 2018-02-12 MED ORDER — LIDOCAINE 2% (20 MG/ML) 5 ML SYRINGE
INTRAMUSCULAR | Status: AC
  Filled 2018-02-12: qty 5

## 2018-02-12 MED ORDER — OXYCODONE-ACETAMINOPHEN 5-325 MG PO TABS
1.0000 | ORAL_TABLET | ORAL | Status: DC | PRN
  Administered 2018-02-12 (×2): 1 via ORAL
  Filled 2018-02-12: qty 2
  Filled 2018-02-12: qty 1

## 2018-02-12 MED ORDER — FENTANYL CITRATE (PF) 250 MCG/5ML IJ SOLN
INTRAMUSCULAR | Status: DC | PRN
  Administered 2018-02-12: 100 ug via INTRAVENOUS
  Administered 2018-02-12 (×6): 50 ug via INTRAVENOUS

## 2018-02-12 MED ORDER — AMLODIPINE BESYLATE 5 MG PO TABS
5.0000 mg | ORAL_TABLET | Freq: Every day | ORAL | Status: DC
  Administered 2018-02-12 – 2018-02-13 (×2): 5 mg via ORAL
  Filled 2018-02-12 (×2): qty 1

## 2018-02-12 MED ORDER — 0.9 % SODIUM CHLORIDE (POUR BTL) OPTIME
TOPICAL | Status: DC | PRN
  Administered 2018-02-12: 2000 mL

## 2018-02-12 MED ORDER — SODIUM CHLORIDE 0.9 % IV SOLN: INTRAVENOUS | Status: DC

## 2018-02-12 MED ORDER — CEFAZOLIN SODIUM 1 G IJ SOLR
INTRAMUSCULAR | Status: AC
  Filled 2018-02-12: qty 70

## 2018-02-12 MED ORDER — HEPARIN SODIUM (PORCINE) 1000 UNIT/ML IJ SOLN
INTRAMUSCULAR | Status: AC
  Filled 2018-02-12: qty 1

## 2018-02-12 MED ORDER — HEPARIN SODIUM (PORCINE) 1000 UNIT/ML IJ SOLN
INTRAMUSCULAR | Status: DC | PRN
  Administered 2018-02-12: 5000 [IU] via INTRAVENOUS
  Administered 2018-02-12: 8000 [IU] via INTRAVENOUS

## 2018-02-12 MED ORDER — ACETAMINOPHEN 325 MG RE SUPP: 325.0000 mg | RECTAL | Status: DC | PRN

## 2018-02-12 MED ORDER — CALCIUM CARBONATE 1250 (500 CA) MG PO TABS
500.0000 mg | ORAL_TABLET | Freq: Every day | ORAL | Status: DC
  Administered 2018-02-13: 500 mg via ORAL
  Filled 2018-02-12: qty 1

## 2018-02-12 MED ORDER — ALUM & MAG HYDROXIDE-SIMETH 200-200-20 MG/5ML PO SUSP: 15.0000 mL | ORAL | Status: DC | PRN

## 2018-02-12 MED ORDER — GUAIFENESIN-DM 100-10 MG/5ML PO SYRP: 15.0000 mL | ORAL_SOLUTION | ORAL | Status: DC | PRN

## 2018-02-12 MED ORDER — OMEPRAZOLE MAGNESIUM 20 MG PO TBEC: 20.0000 mg | DELAYED_RELEASE_TABLET | Freq: Every day | ORAL | Status: DC

## 2018-02-12 MED ORDER — PROPOFOL 1000 MG/100ML IV EMUL
INTRAVENOUS | Status: AC
  Filled 2018-02-12: qty 400

## 2018-02-12 MED ORDER — CEFAZOLIN SODIUM-DEXTROSE 2-4 GM/100ML-% IV SOLN
2.0000 g | Freq: Three times a day (TID) | INTRAVENOUS | Status: AC
  Administered 2018-02-12 (×2): 2 g via INTRAVENOUS
  Filled 2018-02-12 (×2): qty 100

## 2018-02-12 MED ORDER — MIDAZOLAM HCL 5 MG/5ML IJ SOLN
INTRAMUSCULAR | Status: DC | PRN
  Administered 2018-02-12 (×2): 1 mg via INTRAVENOUS

## 2018-02-12 MED ORDER — LACTATED RINGERS IV SOLN
INTRAVENOUS | Status: DC | PRN
  Administered 2018-02-12 (×3): via INTRAVENOUS

## 2018-02-12 MED ORDER — CEFAZOLIN SODIUM-DEXTROSE 2-4 GM/100ML-% IV SOLN
2.0000 g | INTRAVENOUS | Status: AC
  Administered 2018-02-12 (×2): 2 g via INTRAVENOUS

## 2018-02-12 MED ORDER — ASPIRIN EC 81 MG PO TBEC
81.0000 mg | DELAYED_RELEASE_TABLET | Freq: Every day | ORAL | Status: DC
  Administered 2018-02-12 – 2018-02-13 (×2): 81 mg via ORAL
  Filled 2018-02-12 (×2): qty 1

## 2018-02-12 MED ORDER — HEPARIN SODIUM (PORCINE) 5000 UNIT/ML IJ SOLN
5000.0000 [IU] | Freq: Three times a day (TID) | INTRAMUSCULAR | Status: DC
  Administered 2018-02-12 – 2018-02-13 (×2): 5000 [IU] via SUBCUTANEOUS
  Filled 2018-02-12 (×2): qty 1

## 2018-02-12 MED ORDER — FLUTICASONE FUROATE-VILANTEROL 100-25 MCG/INH IN AEPB
1.0000 | INHALATION_SPRAY | Freq: Every day | RESPIRATORY_TRACT | Status: DC
  Administered 2018-02-13: 1 via RESPIRATORY_TRACT

## 2018-02-12 MED ORDER — PROMETHAZINE HCL 25 MG/ML IJ SOLN: 6.2500 mg | INTRAMUSCULAR | Status: DC | PRN

## 2018-02-12 MED ORDER — PANTOPRAZOLE SODIUM 40 MG PO TBEC
40.0000 mg | DELAYED_RELEASE_TABLET | Freq: Every day | ORAL | Status: DC
  Administered 2018-02-12 – 2018-02-13 (×2): 40 mg via ORAL
  Filled 2018-02-12 (×2): qty 1

## 2018-02-12 MED ORDER — LIDOCAINE 2% (20 MG/ML) 5 ML SYRINGE
INTRAMUSCULAR | Status: DC | PRN
  Administered 2018-02-12: 100 mg via INTRAVENOUS

## 2018-02-12 MED ORDER — SODIUM CHLORIDE 0.9 % IV SOLN
INTRAVENOUS | Status: DC | PRN
  Administered 2018-02-12: 25 ug/min via INTRAVENOUS
  Administered 2018-02-12: 12:00:00 via INTRAVENOUS

## 2018-02-12 MED ORDER — NITROGLYCERIN 0.4 MG SL SUBL: 0.4000 mg | SUBLINGUAL_TABLET | SUBLINGUAL | Status: DC | PRN

## 2018-02-12 MED ORDER — ROCURONIUM BROMIDE 10 MG/ML (PF) SYRINGE
PREFILLED_SYRINGE | INTRAVENOUS | Status: DC | PRN
  Administered 2018-02-12: 10 mg via INTRAVENOUS
  Administered 2018-02-12: 50 mg via INTRAVENOUS
  Administered 2018-02-12: 20 mg via INTRAVENOUS

## 2018-02-12 MED ORDER — ALBUTEROL SULFATE (2.5 MG/3ML) 0.083% IN NEBU: 3.0000 mL | INHALATION_SOLUTION | Freq: Four times a day (QID) | RESPIRATORY_TRACT | Status: DC | PRN

## 2018-02-12 MED ORDER — PHENOL 1.4 % MT LIQD: 1.0000 | OROMUCOSAL | Status: DC | PRN

## 2018-02-12 MED ORDER — ACETAMINOPHEN 325 MG PO TABS: 325.0000 mg | ORAL_TABLET | ORAL | Status: DC | PRN

## 2018-02-12 MED ORDER — SODIUM CHLORIDE 0.9 % IV SOLN
INTRAVENOUS | Status: DC | PRN
  Administered 2018-02-12: 500 mL

## 2018-02-12 MED ORDER — PROPOFOL 10 MG/ML IV BOLUS
INTRAVENOUS | Status: DC | PRN
  Administered 2018-02-12: 50 mg via INTRAVENOUS
  Administered 2018-02-12: 150 mg via INTRAVENOUS

## 2018-02-12 SURGICAL SUPPLY — 60 items
BANDAGE ESMARK 6X9 LF (GAUZE/BANDAGES/DRESSINGS) IMPLANT
BNDG ESMARK 6X9 LF (GAUZE/BANDAGES/DRESSINGS)
CANISTER SUCT 3000ML PPV (MISCELLANEOUS) ×3 IMPLANT
CANNULA VESSEL 3MM 2 BLNT TIP (CANNULA) ×9 IMPLANT
CLIP VESOCCLUDE MED 24/CT (CLIP) ×3 IMPLANT
CLIP VESOCCLUDE SM WIDE 24/CT (CLIP) ×9 IMPLANT
CUFF TOURNIQUET SINGLE 24IN (TOURNIQUET CUFF) IMPLANT
CUFF TOURNIQUET SINGLE 34IN LL (TOURNIQUET CUFF) IMPLANT
CUFF TOURNIQUET SINGLE 44IN (TOURNIQUET CUFF) IMPLANT
DERMABOND ADVANCED (GAUZE/BANDAGES/DRESSINGS) ×1
DERMABOND ADVANCED .7 DNX12 (GAUZE/BANDAGES/DRESSINGS) ×2 IMPLANT
DRAIN SNY WOU (WOUND CARE) IMPLANT
DRAPE HALF SHEET 40X57 (DRAPES) IMPLANT
DRAPE X-RAY CASS 24X20 (DRAPES) IMPLANT
ELECT REM PT RETURN 9FT ADLT (ELECTROSURGICAL) ×3
ELECTRODE REM PT RTRN 9FT ADLT (ELECTROSURGICAL) ×2 IMPLANT
EVACUATOR SILICONE 100CC (DRAIN) IMPLANT
GAUZE SPONGE 4X4 16PLY XRAY LF (GAUZE/BANDAGES/DRESSINGS) ×3 IMPLANT
GLOVE BIO SURGEON STRL SZ7.5 (GLOVE) ×6 IMPLANT
GLOVE BIOGEL M 6.5 STRL (GLOVE) ×9 IMPLANT
GLOVE BIOGEL M STRL SZ7.5 (GLOVE) ×3 IMPLANT
GLOVE BIOGEL PI IND STRL 6.5 (GLOVE) ×2 IMPLANT
GLOVE BIOGEL PI IND STRL 7.0 (GLOVE) ×10 IMPLANT
GLOVE BIOGEL PI IND STRL 7.5 (GLOVE) ×2 IMPLANT
GLOVE BIOGEL PI INDICATOR 6.5 (GLOVE) ×1
GLOVE BIOGEL PI INDICATOR 7.0 (GLOVE) ×5
GLOVE BIOGEL PI INDICATOR 7.5 (GLOVE) ×1
GLOVE SURG SIGNA 7.5 PF LTX (GLOVE) ×3 IMPLANT
GOWN STRL REUS W/ TWL LRG LVL3 (GOWN DISPOSABLE) ×6 IMPLANT
GOWN STRL REUS W/ TWL XL LVL3 (GOWN DISPOSABLE) ×10 IMPLANT
GOWN STRL REUS W/TWL LRG LVL3 (GOWN DISPOSABLE) ×3
GOWN STRL REUS W/TWL XL LVL3 (GOWN DISPOSABLE) ×5
HEMOSTAT SPONGE AVITENE ULTRA (HEMOSTASIS) IMPLANT
KIT BASIN OR (CUSTOM PROCEDURE TRAY) ×3 IMPLANT
KIT TURNOVER KIT B (KITS) ×3 IMPLANT
LOOP VESSEL MAXI BLUE (MISCELLANEOUS) ×3 IMPLANT
NS IRRIG 1000ML POUR BTL (IV SOLUTION) ×6 IMPLANT
PACK PERIPHERAL VASCULAR (CUSTOM PROCEDURE TRAY) ×3 IMPLANT
PAD ARMBOARD 7.5X6 YLW CONV (MISCELLANEOUS) ×6 IMPLANT
SET COLLECT BLD 21X3/4 12 (NEEDLE) IMPLANT
STOPCOCK 4 WAY LG BORE MALE ST (IV SETS) IMPLANT
SUT PROLENE 5 0 C 1 24 (SUTURE) ×15 IMPLANT
SUT PROLENE 6 0 CC (SUTURE) ×6 IMPLANT
SUT PROLENE 6 0 CC 1 (SUTURE) ×6 IMPLANT
SUT PROLENE 7 0 BV 1 (SUTURE) ×6 IMPLANT
SUT PROLENE 7 0 BV1 MDA (SUTURE) IMPLANT
SUT SILK 2 0 SH (SUTURE) ×6 IMPLANT
SUT SILK 3 0 (SUTURE)
SUT SILK 3-0 18XBRD TIE 12 (SUTURE) IMPLANT
SUT VIC AB 2-0 SH 27 (SUTURE)
SUT VIC AB 2-0 SH 27XBRD (SUTURE) IMPLANT
SUT VIC AB 3-0 SH 27 (SUTURE) ×10
SUT VIC AB 3-0 SH 27X BRD (SUTURE) ×20 IMPLANT
SUT VIC AB 4-0 PS2 27 (SUTURE) ×6 IMPLANT
TAPE UMBILICAL COTTON 1/8X30 (MISCELLANEOUS) ×3 IMPLANT
TOWEL GREEN STERILE (TOWEL DISPOSABLE) ×3 IMPLANT
TRAY FOLEY MTR SLVR 16FR STAT (SET/KITS/TRAYS/PACK) ×3 IMPLANT
TUBING EXTENTION W/L.L. (IV SETS) IMPLANT
UNDERPAD 30X30 (UNDERPADS AND DIAPERS) ×3 IMPLANT
WATER STERILE IRR 1000ML POUR (IV SOLUTION) ×3 IMPLANT

## 2018-02-12 NOTE — Transfer of Care (Signed)
Immediate Anesthesia Transfer of Care Note  Patient: Devin [name]  Procedure(s) Performed: BYPASS GRAFT FEMORAL-POPLITEAL ARTERY LEFT LEG (Left ) VEIN HARVEST (Left Leg Upper)  Patient Location: PACU  Anesthesia Type:General  Level of Consciousness: awake, drowsy and patient cooperative  Airway & Oxygen Therapy: Patient Spontanous Breathing and Patient connected to nasal cannula oxygen  Post-op Assessment: Report given to RN and Post -op Vital signs reviewed and stable  Post vital signs: Reviewed and stable  Last Vitals:  Vitals Value Taken Time  BP 122/70 02/12/2018  1:10 PM  Temp    Pulse 103 02/12/2018  1:10 PM  Resp 17 02/12/2018  1:10 PM  SpO2 98 % 02/12/2018  1:10 PM  Vitals shown include unvalidated device data.  Last Pain:  Vitals:   02/12/18 0612  TempSrc:   PainSc: 0-No pain         Complications: No apparent anesthesia complications

## 2018-02-12 NOTE — H&P (Signed)
Patient had a right femoral to below-knee popliteal bypass with distal vein patch July 03, 2017.  This was done for claudication.  He has very disadvantaged outflow with a very diseased posterior tibial artery distally and would not be a candidate for reconstruction operatively.  His claudication symptoms in his right leg have now completely resolved.  He still has some persistent swelling on the right side.  He still complains of claudication symptoms in the left leg that occurred at about 1 block.  He denies rest pain.  He has no nonhealing wounds.  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  He has continued to refrain from smoking.  Chronic medical problems include asthma arthritis coronary artery disease hypertension all currently stable.  He is on Pletal aspirin and a statin.      Past Medical History:  Diagnosis Date  . Arthritis   . Asthma   . Carpal tunnel syndrome   . Chest pain, unspecified   . Chronic kidney disease    hx of kidney stones  . Coronary artery disease   . Coronary atherosclerosis of artery bypass graft   . GERD (gastroesophageal reflux disease)   . History of kidney stones   . HOH (hard of hearing)   . Hypertension   . 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 Other and unspecified hyperlipidemia   . Personal history of unspecified circulatory disease   . Shortness of breath   . Stroke ([place])   . TIA (transient ischemic attack)         Past Surgical History:  Procedure Laterality Date  . ABDOMINAL AORTOGRAM N/A 06/14/2017   Procedure: ABDOMINAL AORTOGRAM;  Surgeon: Wellington Hampshire, MD;  Location: [facility];  Service: Cardiovascular;  Laterality: N/A;  . BACK SURGERY     cervical disc  . CARDIAC CATHETERIZATION    . CARPAL TUNNEL RELEASE     blateral carpal tunnel  . CORONARY ARTERY BYPASS GRAFT      x5 12 yrs ago  . ENDARTERECTOMY FEMORAL Right 07/03/2017   Procedure: ENDARTERECTOMY FEMORAL RIGHT;  Surgeon: Elam Dutch, MD;  Location: [facility];  Service: Vascular;  Laterality: Right;  . FEMORAL-POPLITEAL BYPASS GRAFT Right 07/03/2017   Procedure: BYPASS GRAFT FEMORAL-POPLITEAL ARTERY;  Surgeon: Elam Dutch, MD;  Location: [facility];  Service: Vascular;  Laterality: Right;  . LOWER EXTREMITY ANGIOGRAM Bilateral 08/05/2015   Procedure: Lower Extremity Angiogram;  Surgeon: Wellington Hampshire, MD;  Location: [facility];  Service: Cardiovascular;  Laterality: Bilateral;  . LOWER EXTREMITY ANGIOGRAPHY Bilateral 06/14/2017   Procedure: Lower Extremity Angiography;  Surgeon: Wellington Hampshire, MD;  Location: [facility];  Service: Cardiovascular;  Laterality: Bilateral;  . NECK SURGERY    . PATCH ANGIOPLASTY Right 07/03/2017   Procedure: VEIN PATCH ANGIOPLASTY OF FEMORAL AND POSTERIOR TIBIAL ARTERIES;  Surgeon: Elam Dutch, MD;  Location: [facility];  Service: Vascular;  Laterality: Right;  . PERIPHERAL VASCULAR CATHETERIZATION N/A 08/05/2015   Procedure: Abdominal Aortogram;  Surgeon: Wellington Hampshire, MD;  Location: [facility];  Service: Cardiovascular;  Laterality: N/A;  . SHOULDER ARTHROSCOPY W/ ROTATOR CUFF REPAIR     Right          Current Outpatient Medications on File Prior to Visit  Medication Sig Dispense Refill  . albuterol (PROVENTIL HFA;VENTOLIN HFA) 108 (90 Base) MCG/ACT  inhaler Inhale 2 puffs into the lungs every 6 (six) hours as needed for wheezing or shortness of breath.    Marland Kitchen amLODipine (NORVASC) 5 MG tablet Take 1 tablet (5 mg total) by mouth daily. Please call [phone] to schedule annual appointment with Dr. Burt Knack 06-2017 thanks. 90 tablet 3  . aspirin (ASPIRIN LOW DOSE) 81 MG EC tablet Take 81 mg by mouth daily.      Marland Kitchen atorvastatin (LIPITOR) 40 MG tablet TAKE 1 TABLET BY MOUTH  DAILY (Patient taking differently: TAKE 1 TABLET BY  MOUTH  IN THE EVENING) 90 tablet 3  . Cholecalciferol 5000 units TABS Take 5,000 Units by mouth daily.    . cilostazol (PLETAL) 100 MG tablet TAKE 1 TABLET BY MOUTH TWO  TIMES DAILY 180 tablet 3  . dorzolamide (TRUSOPT) 2 % ophthalmic solution     . dorzolamide-timolol (COSOPT) 22.3-6.8 MG/ML ophthalmic solution Place 1 drop into the right eye 2 (two) times daily.     Marland Kitchen HYDROcodone-acetaminophen (NORCO) 5-325 MG tablet Take 1 tablet by mouth every 6 (six) hours as needed for moderate pain. 15 tablet 0  . IRON PO Take 1 tablet by mouth daily.    Marland Kitchen latanoprost (XALATAN) 0.005 % ophthalmic solution Place 1 drop into both eyes at bedtime.     . nitroGLYCERIN (NITROSTAT) 0.4 MG SL tablet Place 1 tablet (0.4 mg total) under the tongue every 5 (five) minutes as needed for chest pain. 25 tablet 3  . omeprazole (PRILOSEC OTC) 20 MG tablet Take 20 mg by mouth daily.      . SYMBICORT 80-4.5 MCG/ACT inhaler     . vitamin B-12 (CYANOCOBALAMIN) 1000 MCG tablet Take 1,000 mcg by mouth daily.    Marland Kitchen zinc gluconate 50 MG tablet Take 50 mg by mouth daily.     No current facility-administered medications on file prior to visit.         Allergies  Allergen Reactions  . Aspirin Other (See Comments)    Has to take the coated aspirin    Physical exam:  Vitals:   02/12/18 0553 02/12/18 0615  BP: (!) 151/59   Pulse: 67   Resp:  16  Temp: 98.3 F (36.8 C)   TempSrc: Oral   SpO2: 100%   Weight: 72.6 kg   Height: 5\' 4"  (1.626 m)     Neck: No carotid bruits  Chest: Clear to auscultation bilaterally  Cardiac: Regular rate and rhythm  Extremities: Diffusely nonpitting edema right leg no palpable pedal pulses foot pink and warm no ulcers  Left lower extremity 2+ left femoral pulse absent popliteal and pedal pulse left foot is more pale and dusky compared to the right  Data: Patient had a duplex ultrasound of his bypass graft which showed that this was patent with an ABI  of 1.1 left side ABI was 0.56.  He also had a vein mapping ultrasound of his left leg today.  This shows a 3 to 4 mm greater saphenous vein to the knee level.  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  Assessment: Patent bypass graft right leg no residual claudication symptoms still with some edema hopefully will resolve over time.  Patient is now requesting left leg bypass for similar claudication symptoms.  Plan:Left fem pop bypass today.  Risk benefits possible complications of procedure details of femoral-popliteal bypass grafting were explained the patient and his wife today.  They understand and agree to proceed.  They also understand that he  is currently not at risk of limb loss and this will be done for lifestyle limiting claudication symptoms.  [name], MD [facility] Office: [phone] Pager: [phone]

## 2018-02-12 NOTE — Progress Notes (Signed)
Patient arrived on the unit from PACU, assessment completed see flow sheet , patient placed on tele ccmd notified, patient oriented to room and staff.bed in lowest position call bell within reach will monitor.

## 2018-02-12 NOTE — Anesthesia Postprocedure Evaluation (Signed)
Anesthesia Post Note  Patient: Devin [name]  Procedure(s) Performed: BYPASS GRAFT FEMORAL-POPLITEAL ARTERY LEFT LEG (Left ) VEIN HARVEST (Left Leg Upper)     Patient location during evaluation: PACU Anesthesia Type: General Level of consciousness: sedated Pain management: pain level controlled Vital Signs Assessment: post-procedure vital signs reviewed and stable Respiratory status: spontaneous breathing and respiratory function stable Cardiovascular status: stable Postop Assessment: no apparent nausea or vomiting Anesthetic complications: no    Last Vitals:  Vitals:   02/12/18 1355 02/12/18 1410  BP: 139/67 140/69  Pulse: 78 78  Resp: 14 15  Temp:    SpO2: 96% 98%    Last Pain:  Vitals:   02/12/18 1410  TempSrc:   PainSc: 0-No pain                 Sarajean Dessert DANIEL

## 2018-02-12 NOTE — Anesthesia Procedure Notes (Signed)
Procedure Name: Intubation Date/Time: 02/12/2018 7:35 AM Performed by: Renato Shin, CRNA Pre-anesthesia Checklist: Patient identified, Emergency Drugs available, Suction available and Patient being monitored Patient Re-evaluated:Patient Re-evaluated prior to induction Oxygen Delivery Method: Circle system utilized Preoxygenation: Pre-oxygenation with 100% oxygen Induction Type: IV induction Ventilation: Mask ventilation without difficulty Laryngoscope Size: Miller and 2 Grade View: Grade I Tube type: Oral Tube size: 7.0 mm Number of attempts: 1 Airway Equipment and Method: Stylet Placement Confirmation: ETT inserted through vocal cords under direct vision,  positive ETCO2,  CO2 detector and breath sounds checked- equal and bilateral Secured at: 21 cm Tube secured with: Tape Dental Injury: Teeth and Oropharynx as per pre-operative assessment

## 2018-02-12 NOTE — Progress Notes (Signed)
PT Cancellation Note  Patient Details Name: VEGAS FRITZE MRN: [id number] DOB: [birth year]   Cancelled Treatment:    Reason Eval/Treat Not Completed: Medical issues which prohibited therapy(Pt just getting to floor from surgery.  Will evaluate in am.)   Denice Paradise 02/12/2018, 2:55 PM Alleah Dearman,PT Acute Rehabilitation Services Pager:  [phone]  Office:  [phone]

## 2018-02-12 NOTE — CV Procedure (Signed)
Procedure: Left femoral to below knee popliteal bypass with non-reversed ipsilateral great saphenous vein  Preoperative diagnosis: Claudication  Postoperative diagnosis: Same  Anesthesia: General  Asst.: Arlee Muslim, PA-C  Operative findings:     Severely Calcified vessels, good quality saphenous vein 3-4 mm  Biphasic PT doppler at end of case  Operative details: After obtaining informed consent, the patient was taken to the operating room. The patient was placed in supine position on the operating room table. After induction of general anesthesia and endotracheal intubation, a Foley catheter was placed. Next, the patient's entire left lower extremity was prepped and draped in the usual sterile fashion. A longitudinal incision was then made in the left groin and carried down through the subcutaneous tissues to expose the right common femoral artery.  There were abundant lymph nodes that I controlled with cautery.  I mobilized the common femoral artery circumferentially to provide adequate are to clamp the common femoral artery.  It was very calcified but I did find a reasonable soft clamp site right at the level of the inguinal ligament.  The common femoral artery was dissected free circumferentially. There was a pulse within the common femoral artery. The distal external iliac artery was dissected free circumferentially underneath the inguinal ligament.  A vessel loop was also placed around the distal external iliac artery. The SFA and profunda were also dissected free circumferentially and vessel loops placed around them.  Next the saphenofemoral junction was identified in the medial portion of the groin incision and this was harvested through several skip incisions on the medial aspect of the leg.  Side branches were ligated and divided between silk ties or clips.  The vein was of good quality 3-4 mm diameter.  The portion below the knee then became varicose and unusable.    The vein harvest  incision was deepened into the fascia at the below knee segment and the below knee popliteal space was entered.  The popliteal artery was dissected free circumferentially.  It was calcified from the AT TPT origins all the way to the knee joint but the artery did have a soft spot just below the joint space.  The anterior tibial vein was ligated and the popliteal vein reflected posteriorly. The origins of the AT and TPT were controlled with vessel loops.   A tunnel was then created between the heads of the gastrocnemius muscle subsartorial up to the groin.  The vein was ligated distally and at the saphenofemoral junction with a 2 0 silk tie.  The vein was gently distended with heparinized saline and inspected for hemostasis.    The patient was given 7000 units of heparin and an additional 5000 units during the case.  After appropriate circulation time, the distal left external iliac artery was controlled with a small Henley clamp. The profunda and SFA were controlled with vessel loops.   A longitudinal opening was made in the common femoral artery on its anterior surface which was adjacent to the profunda origin.  It was a relatively high takeoff of the profunda.  The vessel was calcified but sewable. The vein was placed in a non reversed configuration.  The arteriotomy was extended with Pott's scissors.  The vein was spatulated and sewn end to side to the artery using a running 6 0 Prolene.  Just prior to completion anastomosis everything was forebled backbled and thoroughly flushed. Proximal clamp and distal clamps were removed and there was good pulsatile flow in the profunda femoris artery immediately. The SFA  was chronically occluded.    The graft was then brought through the subsartorial tunnel down to the below-knee popliteal artery after marking for orientation. The below-knee popliteal artery was controlled proximally and with a Henley clamp and the tibial origins controlled with vessel loops. A  longitudinal opening was made in the distal below-knee popliteal artery in an area that was fairly free of calcification. The graft was then cut to length and spatulated and sewn end of graft to side of artery using running 6-0 Prolene suture.  At completion of the anastomosis everything was forebled backbled and thoroughly flushed. The remainder of the anastomosis was completed and all clamps were removed restoring pulsatile flow to the below-knee popliteal artery.  The patient had biphasic Doppler flow in the posterior tibial area of the foot which augmented about 50-75 percent with unclamping.    One repair stitch was placed in the apex of the proximal anastomosis.    After hemostasis was obtained, the deep layers and subcutaneous layers of the below-knee popliteal incision were closed with running 3-0 Vicryl suture. The skin was closed with 4 0 vicryl subcuticular stitch.   The saphenectomy incisions were closed with running 3 0 vicryl follow by 4 0 vicryl subcuticular.  The groin was inspected and found to be hemostatic. This was then closed in multiple layers of running 2 0 and 3-0 Vicryl suture and 4-0 subcuticular stitch. The patient tolerated the procedure well and there were no complications. Instrument sponge and needle counts correct in the case. Patient was taken to the recovery in stable condition.  [name], MD [facility] Office: [phone] Pager: [phone]

## 2018-02-13 ENCOUNTER — Inpatient Hospital Stay (HOSPITAL_COMMUNITY): Payer: Medicare Other

## 2018-02-13 ENCOUNTER — Telehealth: Payer: Self-pay | Admitting: Vascular Surgery

## 2018-02-13 ENCOUNTER — Encounter (HOSPITAL_COMMUNITY): Payer: Self-pay | Admitting: Vascular Surgery

## 2018-02-13 DIAGNOSIS — I739 Peripheral vascular disease, unspecified: Secondary | ICD-10-CM

## 2018-02-13 LAB — BASIC METABOLIC PANEL
Anion gap: 6 (ref 5–15)
BUN: 8 mg/dL (ref 8–23)
CO2: 24 mmol/L (ref 22–32)
CREATININE: 1.01 mg/dL (ref 0.61–1.24)
Calcium: 7.9 mg/dL — ABNORMAL LOW (ref 8.9–10.3)
Chloride: 108 mmol/L (ref 98–111)
GFR calc Af Amer: >60 mL/min (ref >60)
GLUCOSE: 108 mg/dL — AB (ref 70–99)
Potassium: 4 mmol/L (ref 3.5–5.1)
SODIUM: 138 mmol/L (ref 135–145)

## 2018-02-13 LAB — CBC
HCT: 30.1 % — ABNORMAL LOW (ref 39.0–52.0)
Hemoglobin: 9.5 g/dL — ABNORMAL LOW (ref 13.0–17.0)
MCH: 31 pg (ref 26.0–34.0)
MCHC: 31.6 g/dL (ref 30.0–36.0)
MCV: 98.4 fL (ref 78.0–100.0)
PLATELETS: 139 10*3/uL — AB (ref 150–400)
RBC: 3.06 MIL/uL — ABNORMAL LOW (ref 4.22–5.81)
RDW: 14 % (ref 11.5–15.5)
WBC: 7.6 10*3/uL (ref 4.0–10.5)

## 2018-02-13 MED ORDER — OXYCODONE-ACETAMINOPHEN 5-325 MG PO TABS: 1.0000 | ORAL_TABLET | Freq: Four times a day (QID) | ORAL | 0 refills | Status: DC | PRN

## 2018-02-13 MED ORDER — INFLUENZA VAC SPLIT HIGH-DOSE 0.5 ML IM SUSY: 0.5000 mL | PREFILLED_SYRINGE | INTRAMUSCULAR | Status: DC

## 2018-02-13 MED ORDER — INFLUENZA VAC SPLIT HIGH-DOSE 0.5 ML IM SUSY
0.5000 mL | PREFILLED_SYRINGE | Freq: Once | INTRAMUSCULAR | Status: AC
  Administered 2018-02-13: 0.5 mL via INTRAMUSCULAR
  Filled 2018-02-13: qty 0.5

## 2018-02-13 NOTE — Care Management Note (Signed)
Case Management Note Marvetta Gibbons RN, BSN Unit 4E- RN Care Coordinator  [phone]  Patient Details  Name: Devin [name] MRN: [id number] Date of Birth: 12/31/1945  Subjective/Objective:    Pt admitted s/p  fempop bypass                Action/Plan: PTA pt lived at home with spouse, notified by Jonelle Sidle with [facility] pt has pre-op referral for any HH needs. Will follow  Expected Discharge Date:  02/13/18               Expected Discharge Plan:  [place]  In-House Referral:     Discharge planning Services  CM Consult  Post Acute Care Choice:  Home Health Choice offered to:     DME Arranged:    DME Agency:     HH Arranged:    [place] Agency:  [facility]  Status of Service:  Completed, signed off  If discussed at [place] of Stay Meetings, dates discussed:    Discharge Disposition: home/home health  Additional Comments:  02/13/18- 1120- Scotlynn Noyes RN, CM- pt for transition home today- per PT eval no f/u recommendations- CM has notified Tiffany with [facility] of discharge for today- they will f/u with pt post discharge.   Dawayne Patricia, RN 02/13/2018, 11:25 AM

## 2018-02-13 NOTE — Discharge Instructions (Signed)
 [facility]  Discharge instructions  Lower Extremity Bypass Surgery  Please refer to the following instruction for your post-procedure care. Your surgeon or physician assistant will discuss any changes with you.  Activity  You are encouraged to walk as much as you can. You can slowly return to normal activities during the month after your surgery. Avoid strenuous activity and heavy lifting until your doctor tells you it's OK. Avoid activities such as vacuuming or swinging a golf club. Do not drive until your doctor give the OK and you are no longer taking prescription pain medications. It is also normal to have difficulty with sleep habits, eating and bowel movement after surgery. These will go away with time.  Bathing/Showering  You may shower after you go home. Do not soak in a bathtub, hot tub, or swim until the incision heals completely.  Incision Care  Clean your incision with mild soap and water. Shower every day. Pat the area dry with a clean towel. You do not need a bandage unless otherwise instructed. Do not apply any ointments or creams to your incision. If you have open wounds you will be instructed how to care for them or a visiting nurse may be arranged for you. If you have staples or sutures along your incision they will be removed at your post-op appointment. You may have skin glue on your incision. Do not peel it off. It will come off on its own in about one week. If you have a great deal of moisture in your groin, use a gauze help keep this area dry.  Diet  Resume your normal diet. There are no special food restrictions following this procedure. A low fat/ low cholesterol diet is recommended for all patients with vascular disease. In order to heal from your surgery, it is CRITICAL to get adequate nutrition. Your body requires vitamins, minerals, and protein. Vegetables are the best source of vitamins and minerals. Vegetables also provide the  perfect balance of protein. Processed food has little nutritional value, so try to avoid this.  Medications  Resume taking all your medications unless your doctor or nurse practitioner tells you not to. If your incision is causing pain, you may take over-the-counter pain relievers such as acetaminophen (Tylenol). If you were prescribed a stronger pain medication, please aware these medication can cause nausea and constipation. Prevent nausea by taking the medication with a snack or meal. Avoid constipation by drinking plenty of fluids and eating foods with high amount of fiber, such as fruits, vegetables, and grains. Take Colase 100 mg (an over-the-counter stool softener) twice a day as needed for constipation. Do not take Tylenol if you are taking prescription pain medications.  Follow Up  Our office will schedule a follow up appointment 2-3 weeks following discharge.  Please call us immediately for any of the following conditions  Severe or worsening pain in your legs or feet while at rest or while walking Increase pain, redness, warmth, or drainage (pus) from your incision site(s) Fever of 101 degree or higher The swelling in your leg with the bypass suddenly worsens and becomes more painful than when you were in the hospital If you have been instructed to feel your graft pulse then you should do so every day. If you can no longer feel this pulse, call the office immediately. Not all patients are given this instruction.  Leg swelling is common after leg bypass surgery.  The swelling should improve over a few months   following surgery. To improve the swelling, you may elevate your legs above the level of your heart while you are sitting or resting. Your surgeon or physician assistant may ask you to apply an ACE wrap or wear compression (TED) stockings to help to reduce swelling.  Reduce your risk of vascular disease  Stop smoking. If you would like help call [facility] at 1-800-QUIT-NOW  (1-800-784-8669) or [facility] at 336-586-4000.  Manage your cholesterol Maintain a desired weight Control your diabetes weight Control your diabetes Keep your blood pressure down  If you have any questions, please call the office at [phone]   

## 2018-02-13 NOTE — Telephone Encounter (Signed)
sch appt spk to pt wife 03/08/18 3pm p/o MD

## 2018-02-13 NOTE — Progress Notes (Signed)
Pt slept most of the night c/o pain times one, PRN was effective. Bloody drainage noted on pad under him in the beginning of this shift, Pad was changed, he got a bath no more drainage noted the rest of the night, he dangled his legs on the side of the bed with some difficulty times one. FC removed.  Wife at bedside.

## 2018-02-13 NOTE — Discharge Summary (Signed)
Physician Discharge Summary   Patient ID: Devin [name] [id number] y.o. [birth year]  Admit date: 02/12/2018  Discharge date and time: 02/13/18   Admitting Physician: Elam Dutch, MD   Discharge Physician: Same  Admission Diagnoses: PERIPHERAL ARTERY DISEASE  Discharge Diagnoses: PAD status post femoral-popliteal bypass  Admission Condition: fair  Discharged Condition: fair  Indication for Admission: Debilitating claudication  Hospital Course: [name] is a 72 year old male who came in as an outpatient for left femoral to below the knee popliteal bypass with saphenous vein graft by Dr. Oneida Alar on 02/12/2018 due to debilitating claudication.  He tolerated the procedure well and was admitted to the hospital postoperatively.  POD #1 patient is ambulating without difficulty, tolerating a home diet, and feeling fit for discharge home.  He has a posterior tibial and peroneal Doppler signal.  He will follow-up in office in 2 weeks.  He will be prescribed 2 to 3 days of narcotic pain medication for continued postoperative pain control.  Discharge instructions were reviewed with the patient and he voices understanding.  He will be discharged home this morning in stable condition.  Consults: None  Treatments: surgery: Left femoral to below the knee popliteal bypass with non-reversed ipsilateral great saphenous vein by Dr. Oneida Alar on 02/12/2018  Discharge Exam: See progress note 02/12/2018 Vitals:   02/13/18 0730 02/13/18 0851  BP: (!) 95/59 (!) 108/54  Pulse: 76   Resp: 17   Temp: 98.2 F (36.8 C)   SpO2: 93%     Disposition: Discharge disposition: 01-Home or Self Care       - For [facility] use ---  Post-op:  Wound infection: No  Graft infection: No  Transfusion: No  If yes,  units given New Arrhythmia: No Ipsilateral amputation: [ x] no, [ ]  Minor, [ ]  BKA, [ ]  AKA Patency judged by: [x ] Dopper only, [ ]  Palpable graft pulse, [ ]  Palpable distal pulse, [ ]   ABI inc. > 0.15, [ ]  Duplex D/C Ambulatory Status: Ambulatory  Complications: MI: [ x] No, [ ]  Troponin only, [ ]  EKG or Clinical CHF: No Resp failure: [x ] none, [ ]  Pneumonia, [ ]  Ventilator Chg in renal function: [x ] none, [ ]  Inc. Cr > 0.5, [ ]  Temp. Dialysis, [ ]  Permanent dialysis Stroke: [x ] None, [ ]  Minor, [ ]  Major Return to OR: No  Reason for return to OR: [ ]  Bleeding, [ ]  Infection, [ ]  Thrombosis, [ ]  Revision  Discharge medications: Statin use:  Yes ASA use:  Yes Plavix use:  No  for medical reason not indicated Beta blocker use: Yes Coumadin use: No  for medical reason not indicated   Patient Instructions:   Activity: activity as tolerated Diet: regular diet Wound Care: keep wound clean and dry  Follow-up with Dr. Oneida Alar in 2 weeks.  SignedDagoberto Ligas 02/13/2018 11:12 AM

## 2018-02-13 NOTE — Evaluation (Signed)
Occupational Therapy Evaluation Patient Details Name: Devin [name] MRN: [id number] DOB: [birth year] Today's Date: 02/13/2018    History of Present Illness Pt s/p LLE femoral to below knee popliteal bypass. PMH - RLE bypass, CABG,  asthma, htn, cva   Clinical Impression   Patient evaluated by Occupational Therapy with no further acute OT needs identified. All education has been completed and the patient has no further questions. See below for any follow-up Occupational Therapy or equipment needs. OT to sign off. Thank you for referral.      Follow Up Recommendations  No OT follow up    Equipment Recommendations  None recommended by OT    Recommendations for Other Services       Precautions / Restrictions Precautions Precautions: None Restrictions Weight Bearing Restrictions: No      Mobility Bed Mobility Overal bed mobility: Modified Independent             General bed mobility comments: Incr time and effort  Transfers Overall transfer level: Needs assistance Equipment used: Rolling walker (2 wheeled) Transfers: Sit to/from Stand Sit to Stand: Supervision         General transfer comment: Incr time and assist for safety. Verbal cues for hand placement    Balance Overall balance assessment: Needs assistance Sitting-balance support: No upper extremity supported;Feet supported Sitting balance-Leahy Scale: Good     Standing balance support: Single extremity supported Standing balance-Leahy Scale: Fair Standing balance comment: walker and supervision for static standing                           ADL either performed or assessed with clinical judgement   ADL Overall ADL's : Needs assistance/impaired Eating/Feeding: Independent   Grooming: Independent   Upper Body Bathing: Supervision/ safety   Lower Body Bathing: Moderate assistance       Lower Body Dressing: Moderate assistance   Toilet Transfer: Supervision/safety       Tub/  Shower Transfer: Supervision/safety;Walk-in shower;Ambulation;Rolling walker   Functional mobility during ADLs: Supervision/safety;Rolling walker General ADL Comments: pt and wife able to demonstrates shower transfer. pt and wife educated on reverse sequenec adn using the L LE first for dressing and descending from shower. educated on use of fresh clean linen to decr risk for infection / the importance of edema management and positioning. wife plans to (A) for 5 days and son will be present for weekend coverage     Vision         Perception     Praxis      Pertinent Vitals/Pain Pain Assessment: Faces Faces Pain Scale: Hurts even more Pain Location: LLE Pain Descriptors / Indicators: Grimacing;Operative site guarding Pain Intervention(s): Limited activity within patient's tolerance;Monitored during session;Repositioned     Hand Dominance Right   Extremity/Trunk Assessment Upper Extremity Assessment Upper Extremity Assessment: Overall WFL for tasks assessed   Lower Extremity Assessment Lower Extremity Assessment: LLE deficits/detail LLE Deficits / Details: edema noted s/p surgery   Cervical / Trunk Assessment Cervical / Trunk Assessment: Normal   Communication Communication Communication: No difficulties   Cognition Arousal/Alertness: Awake/alert Behavior During Therapy: WFL for tasks assessed/performed Overall Cognitive Status: Within Functional Limits for tasks assessed                                     General Comments  bil Le edema noted at ankles  Exercises     Shoulder Instructions      Home Living Family/patient expects to be discharged to:: Private residence Living Arrangements: Spouse/significant other Available Help at Discharge: Family;Available 24 hours/day Type of Home: House Home Access: Stairs to enter [employer] of Steps: 3 Entrance Stairs-Rails: Right Home Layout: One level     Bathroom Shower/Tub: Emergency planning/management officer: Standard     Home Equipment: Environmental consultant - 2 wheels;Crutches;Cane - single point          Prior Functioning/Environment Level of Independence: Needs assistance  Gait / Transfers Assistance Needed: Independent without assistive device. Distance limited by claudication ADL's / Homemaking Assistance Needed: wife (A) with dressing / bathing after surgery of R LE otherwise patient is independent   Comments: pt has been independent but walking distance has been limited by symptoms of intermittent claudication.         OT Problem List:        OT Treatment/Interventions:      OT Goals(Current goals can be found in the care plan section) Acute Rehab OT Goals Patient Stated Goal: to go home today Potential to Achieve Goals: Good  OT Frequency:     Barriers to D/C:            Co-evaluation              AM-PAC PT "6 Clicks" Daily Activity     Outcome Measure Help from another person eating meals?: None Help from another person taking care of personal grooming?: None Help from another person toileting, which includes using toliet, bedpan, or urinal?: None Help from another person bathing (including washing, rinsing, drying)?: A Little Help from another person to put on and taking off regular upper body clothing?: None Help from another person to put on and taking off regular lower body clothing?: A Little 6 Click Score: 22   End of Session Equipment Utilized During Treatment: Rolling walker Nurse Communication: Mobility status;Precautions  Activity Tolerance: Patient tolerated treatment well Patient left: in chair;with call bell/phone within reach;with family/visitor present                   Time: 5797-2820 OT Time Calculation (min): 11 min Charges:  OT General Charges $OT Visit: 1 Visit OT Evaluation $OT Eval Low Complexity: 1 Low   Jeri Modena, OTR/L  Acute Rehabilitation Services Pager: [phone] Office: [phone] .   Parke Poisson B 02/13/2018, 12:20 PM

## 2018-02-13 NOTE — Progress Notes (Signed)
Patient in a stable condition, discharge education reviewed with patient and his wife at bedside, they verbalized understanding, iv removed, tele dc ccmd notified, prescription given to patient , patient belongings at bedside, patient to be transported home by his wife.

## 2018-02-13 NOTE — Progress Notes (Signed)
VASCULAR LAB PRELIMINARY  ARTERIAL  ABI completed:     RIGHT    LEFT    PRESSURE WAVEFORM  PRESSURE WAVEFORM  BRACHIAL 127 triphasic BRACHIAL 238 triphasic  DP 162 triphasic DP absent monophasic  AT   AT    PT 162 biphasic PT 58 monophasic  PER   PER    GREAT TOE  NA GREAT TOE  NA    RIGHT LEFT  ABI 1.37 0.45    Rita Sturdivant,RVT [place], RVT [place] 02/13/2018, 12:09 PM

## 2018-02-13 NOTE — Evaluation (Signed)
Physical Therapy Evaluation Patient Details Name: Devin [name] MRN: [id number] DOB: [birth year] Today's Date: 02/13/2018   History of Present Illness  Pt s/p LLE femoral to below knee popliteal bypass. PMH - RLE bypass, CABG,  asthma, htn, cva  Clinical Impression  Pt mobilizing as expected after LLE bypass. Recommend return home with wife. Will follow while in the hospital but will not need PT after DC.     Follow Up Recommendations No PT follow up    Equipment Recommendations  None recommended by PT    Recommendations for Other Services       Precautions / Restrictions Precautions Precautions: None Restrictions Weight Bearing Restrictions: No      Mobility  Bed Mobility Overal bed mobility: Modified Independent             General bed mobility comments: Incr time and effort  Transfers Overall transfer level: Needs assistance Equipment used: Rolling walker (2 wheeled) Transfers: Sit to/from Stand Sit to Stand: Min guard         General transfer comment: Incr time and assist for safety. Verbal cues for hand placement  Ambulation/Gait Ambulation/Gait assistance: Supervision;Min guard Gait Distance (Feet): 200 Feet Assistive device: Rolling walker (2 wheeled) Gait Pattern/deviations: Step-through pattern;Decreased step length - right;Decreased step length - left;Antalgic Gait velocity: decr Gait velocity interpretation: <1.31 ft/sec, indicative of household ambulator General Gait Details: Assistance for safety. Pt with increased confidence and stability as distance progressed.   Stairs            Wheelchair Mobility    Modified Rankin (Stroke Patients Only)       Balance Overall balance assessment: Needs assistance Sitting-balance support: No upper extremity supported;Feet supported Sitting balance-Leahy Scale: Good     Standing balance support: Single extremity supported Standing balance-Leahy Scale: Poor Standing balance comment:  walker and supervision for static standing                             Pertinent Vitals/Pain Pain Assessment: Faces Faces Pain Scale: Hurts even more Pain Location: LLE Pain Descriptors / Indicators: Grimacing;Operative site guarding Pain Intervention(s): Limited activity within patient's tolerance;Monitored during session;Repositioned    Home Living Family/patient expects to be discharged to:: Private residence Living Arrangements: Spouse/significant other Available Help at Discharge: Family;Available 24 hours/day Type of Home: House Home Access: Stairs to enter Entrance Stairs-Rails: Right Entrance Stairs-Number of Steps: 3 Home Layout: One level        Prior Function Level of Independence: Needs assistance   Gait / Transfers Assistance Needed: Independent without assistive device. Distance limited by claudication           Hand Dominance   Dominant Hand: Right    Extremity/Trunk Assessment   Upper Extremity Assessment Upper Extremity Assessment: Defer to OT evaluation    Lower Extremity Assessment Lower Extremity Assessment: LLE deficits/detail LLE Deficits / Details: Limited by expected post op soreness. ROM functional but decreased due to soreness       Communication   Communication: No difficulties  Cognition Arousal/Alertness: Awake/alert Behavior During Therapy: WFL for tasks assessed/performed Overall Cognitive Status: Within Functional Limits for tasks assessed                                        General Comments General comments (skin integrity, edema, etc.): Pt amb on RA and SpO2 93%.  Dyspnea 3/4 with ambulation    Exercises     Assessment/Plan    PT Assessment Patient needs continued PT services  PT Problem List Decreased range of motion;Decreased balance;Decreased mobility;Pain       PT Treatment Interventions DME instruction;Gait training;Stair training;Functional mobility training;Therapeutic  activities;Therapeutic exercise;Balance training;Patient/family education    PT Goals (Current goals can be found in the Care Plan section)  Acute Rehab PT Goals Patient Stated Goal: be able to walk outside PT Goal Formulation: With patient Time For Goal Achievement: 02/20/18 Potential to Achieve Goals: Good    Frequency Min 3X/week   Barriers to discharge        Co-evaluation               AM-PAC PT "6 Clicks" Daily Activity  Outcome Measure Difficulty turning over in bed (including adjusting bedclothes, sheets and blankets)?: A Little Difficulty moving from lying on back to sitting on the side of the bed? : A Little Difficulty sitting down on and standing up from a chair with arms (e.g., wheelchair, bedside commode, etc,.)?: A Little Help needed moving to and from a bed to chair (including a wheelchair)?: A Little Help needed walking in hospital room?: A Little Help needed climbing 3-5 steps with a railing? : A Little 6 Click Score: 18    End of Session Equipment Utilized During Treatment: Gait belt Activity Tolerance: Patient tolerated treatment well Patient left: in bed;with family/visitor present;Other (comment)(With OT present) Nurse Communication: Mobility status PT Visit Diagnosis: Other abnormalities of gait and mobility (R26.89);Pain Pain - Right/Left: Left Pain - part of body: Leg    Time: 0922-0940 PT Time Calculation (min) (ACUTE ONLY): 18 min   Charges:   PT Evaluation $PT Eval Low Complexity: 1 Low          [place] Pager [phone] Office [place] 02/13/2018, 9:55 AM

## 2018-02-13 NOTE — Progress Notes (Signed)
[facility]  Subjective  - feels ok more sensation in foot   Objective (!) 95/59 76 98.2 F (36.8 C) (Oral) 17 93%  Intake/Output Summary (Last 24 hours) at 02/13/2018 0758 Last data filed at 02/13/2018 0500 Gross per 24 hour  Intake 2481.81 ml  Output 2275 ml  Net 206.81 ml   Left foot pink warm incisions clean no hematoma Brisk PT peroneal doppler flow  Assessment/Planning: Ambulate this morning d/c home today or tomorrow Acute blood loss anemia asymptomatic  [name] 02/13/2018 7:58 AM --  Laboratory Lab Results: Recent Labs    02/12/18 1453 02/13/18 0337  WBC 7.5 7.6  HGB 12.0* 9.5*  HCT 37.2* 30.1*  PLT 150 139*   BMET Recent Labs    02/12/18 1453 02/13/18 0337  NA  --  138  K  --  4.0  CL  --  108  CO2  --  24  GLUCOSE  --  108*  BUN  --  8  CREATININE 1.05 1.01  CALCIUM  --  7.9*    COAG Lab Results  Component Value Date   INR 1.01 02/05/2018   INR 0.97 06/27/2017   INR 1.0 06/09/2017   No results found for: PTT

## 2018-02-15 ENCOUNTER — Telehealth: Payer: Self-pay | Admitting: Cardiovascular Disease

## 2018-02-15 NOTE — Telephone Encounter (Signed)
Left message for [facility] to return.

## 2018-02-15 NOTE — Telephone Encounter (Signed)
° ° °  Pt c/o medication issue:  1. Name of Medication: Prilosec and Pletal  2. How are you currently taking this medication (dosage and times per day)? As written  3. Are you having a reaction (difficulty breathing--STAT)? No  4. What is your medication issue? Kathlee Nations from [facility] calling to confirm patient should be taking both meds. Please call [phone]

## 2018-02-15 NOTE — Telephone Encounter (Signed)
Informed Kathlee Nations with [facility] that pharmacy has verified that it is okay for patient to take these medications.

## 2018-02-16 ENCOUNTER — Encounter (HOSPITAL_COMMUNITY): Payer: Self-pay

## 2018-02-20 ENCOUNTER — Ambulatory Visit: Payer: Medicare Other | Admitting: Cardiovascular Disease

## 2018-02-28 ENCOUNTER — Encounter: Payer: Self-pay | Admitting: *Deleted

## 2018-03-05 ENCOUNTER — Other Ambulatory Visit: Payer: Self-pay | Admitting: Cardiovascular Disease

## 2018-03-05 NOTE — Telephone Encounter (Signed)
Refill Request.  

## 2018-03-06 ENCOUNTER — Encounter: Payer: Self-pay | Admitting: Cardiovascular Disease

## 2018-03-06 ENCOUNTER — Ambulatory Visit (INDEPENDENT_AMBULATORY_CARE_PROVIDER_SITE_OTHER): Payer: Medicare Other | Admitting: Cardiovascular Disease

## 2018-03-06 VITALS — BP 124/62 | HR 73 | Ht 64.0 in | Wt 154.4 lb

## 2018-03-06 DIAGNOSIS — I2510 Atherosclerotic heart disease of native coronary artery without angina pectoris: Secondary | ICD-10-CM | POA: Diagnosis not present

## 2018-03-06 DIAGNOSIS — I25810 Atherosclerosis of coronary artery bypass graft(s) without angina pectoris: Secondary | ICD-10-CM

## 2018-03-06 DIAGNOSIS — Z720 Tobacco use: Secondary | ICD-10-CM | POA: Diagnosis not present

## 2018-03-06 DIAGNOSIS — I739 Peripheral vascular disease, unspecified: Secondary | ICD-10-CM

## 2018-03-06 DIAGNOSIS — E785 Hyperlipidemia, unspecified: Secondary | ICD-10-CM | POA: Diagnosis not present

## 2018-03-06 DIAGNOSIS — I10 Essential (primary) hypertension: Secondary | ICD-10-CM

## 2018-03-06 MED ORDER — TAMSULOSIN HCL 0.4 MG PO CAPS: 0.4000 mg | ORAL_CAPSULE | Freq: Every day | ORAL | 3 refills | Status: DC

## 2018-03-06 MED ORDER — CEPHALEXIN 500 MG PO CAPS: 500.0000 mg | ORAL_CAPSULE | Freq: Three times a day (TID) | ORAL | 0 refills | Status: DC

## 2018-03-06 NOTE — Patient Instructions (Signed)
Medication Instructions:  START Keflex 500 mg (1 tablet) three times daily for 7 days  START Flomax 0.4 mg daily  If you need a refill on your cardiac medications before your next appointment, please call your pharmacy.   Follow-Up: 4 months with Dr. Fletcher Anon

## 2018-03-06 NOTE — Progress Notes (Signed)
Cardiology Office Note   Date:  03/06/2018   ID:  NYSHAUN STANDAGE, DOB 01-15-1946, MRN [id number]  PCP:  [name], MD  Cardiologist:  Dr. Burt Knack  Chief Complaint  Patient presents with  . Follow-up    pt have painful swelling in groin area      History of Present Illness: Devin [name] is a 72 y.o. male who presents for  a follow-up visit regarding peripheral arterial disease. He has known history of CAD s/p CABG years ago, cardiac cath in 2007 showed patent grafts.  He has known history of hyperlipidemia and tobacco use. He is not diabetic.  He is known to have severe bilateral leg claudication with heavily calcified vessels.  The patient underwent right common femoral artery endarterectomy and right femoral to below-knee popliteal bypass in February of this year.  Most recently, he underwent left femoral to below-knee popliteal bypass with vein on September 16.  He has been doing reasonably well but he complains of significant swelling in the left groin.  The area is also redness with some discharge.  He is having hard time passing urine.     Past Medical History:  Diagnosis Date  . Arthritis   . Asthma   . Carpal tunnel syndrome   . Chest pain, unspecified   . Chronic kidney disease    hx of kidney stones  . Coronary artery disease   . Coronary atherosclerosis of artery bypass graft   . GERD (gastroesophageal reflux disease)   . History of kidney stones   . HOH (hard of hearing)   . Hypertension   . 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 Other and unspecified hyperlipidemia   . Personal history of unspecified circulatory disease   . Shortness of breath   . Stroke ([place])   . TIA (transient ischemic attack)     Past Surgical History:  Procedure Laterality Date  . ABDOMINAL AORTOGRAM N/A 06/14/2017   Procedure: ABDOMINAL AORTOGRAM;  Surgeon: Wellington Hampshire, MD;  Location: [place] [facility];  Service:  Cardiovascular;  Laterality: N/A;  . ABDOMINAL AORTOGRAM N/A 01/26/2018   Procedure: ABDOMINAL AORTOGRAM;  Surgeon: Elam Dutch, MD;  Location: [place] [facility];  Service: Cardiovascular;  Laterality: N/A;  . BACK SURGERY     cervical disc  . CARDIAC CATHETERIZATION    . CARPAL TUNNEL RELEASE     blateral carpal tunnel  . CORONARY ARTERY BYPASS GRAFT     x5 12 yrs ago  . ENDARTERECTOMY FEMORAL Right 07/03/2017   Procedure: ENDARTERECTOMY FEMORAL RIGHT;  Surgeon: Elam Dutch, MD;  Location: [place];  Service: Vascular;  Laterality: Right;  . FEMORAL-POPLITEAL BYPASS GRAFT Right 07/03/2017   Procedure: BYPASS GRAFT FEMORAL-POPLITEAL ARTERY;  Surgeon: Elam Dutch, MD;  Location: [place];  Service: Vascular;  Laterality: Right;  . FEMORAL-POPLITEAL BYPASS GRAFT Left 02/12/2018   Procedure: BYPASS GRAFT FEMORAL-POPLITEAL ARTERY LEFT LEG;  Surgeon: Elam Dutch, MD;  Location: [place];  Service: Vascular;  Laterality: Left;  . LOWER EXTREMITY ANGIOGRAM Bilateral 08/05/2015   Procedure: Lower Extremity Angiogram;  Surgeon: Wellington Hampshire, MD;  Location: [place] [facility];  Service: Cardiovascular;  Laterality: Bilateral;  . LOWER EXTREMITY ANGIOGRAPHY Bilateral 06/14/2017   Procedure: Lower Extremity Angiography;  Surgeon: Wellington Hampshire, MD;  Location: [place] [facility];  Service: Cardiovascular;  Laterality: Bilateral;  . LOWER EXTREMITY ANGIOGRAPHY Bilateral 01/26/2018   Procedure: LOWER  EXTREMITY ANGIOGRAPHY;  Surgeon: Elam Dutch, MD;  Location: [place] [facility];  Service: Cardiovascular;  Laterality: Bilateral;  . NECK SURGERY    . PATCH ANGIOPLASTY Right 07/03/2017   Procedure: VEIN PATCH ANGIOPLASTY OF FEMORAL AND POSTERIOR TIBIAL ARTERIES;  Surgeon: Elam Dutch, MD;  Location: [place];  Service: Vascular;  Laterality: Right;  . PERIPHERAL VASCULAR CATHETERIZATION N/A 08/05/2015   Procedure: Abdominal Aortogram;  Surgeon: Wellington Hampshire, MD;  Location: [place]  [facility];  Service: Cardiovascular;  Laterality: N/A;  . SHOULDER ARTHROSCOPY W/ ROTATOR CUFF REPAIR     Right  . VEIN HARVEST Left 02/12/2018   Procedure: VEIN HARVEST;  Surgeon: Elam Dutch, MD;  Location: [facility];  Service: Vascular;  Laterality: Left;     Current Outpatient Medications  Medication Sig Dispense Refill  . albuterol (PROVENTIL HFA;VENTOLIN HFA) 108 (90 Base) MCG/ACT inhaler Inhale 2 puffs into the lungs every 6 (six) hours as needed for wheezing or shortness of breath.    Marland Kitchen amLODipine (NORVASC) 5 MG tablet Take 1 tablet (5 mg total) by mouth daily. Please call [phone] to schedule annual appointment with Dr. Burt Knack 06-2017 thanks. 90 tablet 3  . aspirin (ASPIRIN LOW DOSE) 81 MG EC tablet Take 81 mg by mouth daily.      Marland Kitchen atorvastatin (LIPITOR) 40 MG tablet TAKE 1 TABLET BY MOUTH  DAILY 90 tablet 1  . calcium carbonate (OSCAL) 1500 (600 Ca) MG TABS tablet Take 600 mg of elemental calcium by mouth daily with breakfast.    . Cholecalciferol 5000 units TABS Take 5,000 Units by mouth daily.    . cilostazol (PLETAL) 100 MG tablet TAKE 1 TABLET BY MOUTH TWO  TIMES DAILY 180 tablet 3  . dorzolamide-timolol (COSOPT) 22.3-6.8 MG/ML ophthalmic solution Place 1 drop into the right eye 2 (two) times daily.     . IRON PO Take 1 tablet by mouth daily.    Marland Kitchen latanoprost (XALATAN) 0.005 % ophthalmic solution Place 1 drop into both eyes every morning.     . nitroGLYCERIN (NITROSTAT) 0.4 MG SL tablet Place 1 tablet (0.4 mg total) under the tongue every 5 (five) minutes as needed for chest pain. 25 tablet 3  . omeprazole (PRILOSEC OTC) 20 MG tablet Take 20 mg by mouth daily.      Marland Kitchen oxyCODONE-acetaminophen (PERCOCET/ROXICET) 5-325 MG tablet Take 1 tablet by mouth every 6 (six) hours as needed for moderate pain. 20 tablet 0  . SYMBICORT 80-4.5 MCG/ACT inhaler Inhale 2 puffs into the lungs 2 (two) times daily.     . vitamin B-12 (CYANOCOBALAMIN) 1000 MCG tablet Take 1,000 mcg by mouth daily.      Marland Kitchen zinc gluconate 50 MG tablet Take 50 mg by mouth daily.     No current facility-administered medications for this visit.     Allergies:   Aspirin    Social History:  The patient  reports that he quit smoking about 8 months ago. His smoking use included cigarettes and cigars. He has a 50.00 pack-year smoking history. He has never used smokeless tobacco. He reports that he does not drink alcohol or use drugs.   Family History:  The patient's family history includes Arthritis in his unknown relative; Asthma in his unknown relative; Coronary artery disease in his unknown relative; Heart disease in his unknown relative.    ROS:  Please see the history of present illness.   Otherwise, review of systems are positive for none.   All other  systems are reviewed and negative.    PHYSICAL EXAM: VS:  BP 124/62   Pulse 73   Ht 5\' 4"  (1.626 m)   Wt 154 lb 6.4 oz (70 kg)   BMI 26.50 kg/m  , BMI Body mass index is 26.5 kg/m. GEN: Well nourished, well developed, in no acute distress  HEENT: normal  Neck: no JVD, carotid bruits, or masses Cardiac: RRR with premature beats; no murmurs, rubs, or gallops,no edema  Respiratory:  clear to auscultation bilaterally, normal work of breathing GI: soft, nontender, nondistended, + BS MS: no deformity or atrophy  Skin: warm and dry, no rash Neuro:  Strength and sensation are intact Psych: euthymic mood, full affect Vascular: Surgical scar in the right groin .  Surgical scar in the left groin with significant swelling and redness.  The wound is slightly open with some whitish discharge.   EKG:  EKG is not ordered today. The ekg ordered today demonstrates normal sinus rhythm with PVCs.   Recent Labs: 02/05/2018: ALT 30 02/13/2018: BUN 8; Creatinine, Ser 1.01; Hemoglobin 9.5; Platelets 139; Potassium 4.0; Sodium 138    Lipid Panel No results found for: CHOL, TRIG, HDL, CHOLHDL, VLDL, LDLCALC, LDLDIRECT    Wt Readings from Last 3 Encounters:   03/06/18 154 lb 6.4 oz (70 kg)  02/12/18 160 lb (72.6 kg)  02/05/18 160 lb 11.2 oz (72.9 kg)      Other studies Reviewed: Additional studies/ records that were reviewed today include:n/a  No flowsheet data found.    ASSESSMENT AND PLAN:  1. PAD with severe claudication: Status post bilateral surgical revascularization most recently on the left side last month.  Now with significant swelling in the left groin with possible superficial infection.  I elected to add Keflex 500 mg 3 times daily for a week.  He has a follow-up appointment with Dr. Oneida Alar on Thursday and the wound can be rechecked at that time.  2. CAD status post CABG: The patient is stable and has no symptoms of angina. Continue aspirin daily.   3. Hyperlipidemia: Continue Atorvastatin with a target LDL <70.   4. Tobacco abuse: He quit smoking in January.  5.  Essential hypertension: Blood pressure is controlled on current medications.  6.  Symptoms of BPH: I prescribed Flomax.    Disposition:   FU with me in 6 months.   Signed,  [name], MD  03/06/2018 9:44 AM    [facility]

## 2018-03-08 ENCOUNTER — Encounter: Payer: Self-pay | Admitting: Vascular Surgery

## 2018-03-08 ENCOUNTER — Other Ambulatory Visit: Payer: Self-pay

## 2018-03-08 ENCOUNTER — Ambulatory Visit (INDEPENDENT_AMBULATORY_CARE_PROVIDER_SITE_OTHER): Payer: Self-pay | Admitting: Vascular Surgery

## 2018-03-08 VITALS — BP 124/71 | HR 75 | Temp 97.7°F | Resp 20 | Ht 64.0 in | Wt 155.0 lb

## 2018-03-08 DIAGNOSIS — I739 Peripheral vascular disease, unspecified: Secondary | ICD-10-CM

## 2018-03-08 NOTE — Progress Notes (Signed)
Patient is a 72 year old male who returns for follow-up today.  He previously underwent left femoral to below-knee popliteal bypass with non-reversed greater saphenous February 12, 2018.  He also underwent right femoral to posterior tibial artery bypass with 6 mm propatent and femoral endarterectomy with a distal vein patch July 13, 2017.  He was recently seen by his cardiologist Dr. Fletcher Anon had noted to have some left groin swelling.  He was placed on a 7-day course of Keflex.  He denies any fever chills or incisional drainage.  He still has bilateral lower extremity edema.  I spoke with him about wearing compression stockings but he states that these are difficult to get on and they hurt his legs.  He denies rest pain in the feet.  Physical exam:  Vitals:   03/08/18 1457  BP: 124/71  Pulse: 75  Resp: 20  Temp: 97.7 F (36.5 C)  TempSrc: Oral  SpO2: 98%  Weight: 155 lb (70.3 kg)  Height: 5\' 4"  (1.626 m)    Extremities: 2+ edema from the knee down bilateral legs no palpable pedal pulses feet pink warm well-perfused bilaterally, left groin with a ridge of tissue underneath the incision consistent with either small hematoma lymphatics or scar tissue no erythema no drainage otherwise incisions are healing  Assessment: #1 bilateral lower extremity edema most likely secondary to lymphatic disruption as well as harvesting of the greater saphenous vein bilaterally.  Hopefully this will improve with time.  He may need to have addition of Lasix to his medication regimen.  We will see how he does over the next few weeks.  Encouraged him to try to wear compression stockings if possible.  Left groin swelling doubt this represents infection but encouraged the patient to complete his full course of antibiotics.  He will follow-up with me in a few weeks time to see if this has resolved.  [name], MD [facility] Office: [phone] Pager: [phone]

## 2018-04-12 ENCOUNTER — Encounter: Payer: Self-pay | Admitting: Vascular Surgery

## 2018-04-12 ENCOUNTER — Ambulatory Visit (INDEPENDENT_AMBULATORY_CARE_PROVIDER_SITE_OTHER): Payer: Medicare Other | Admitting: Vascular Surgery

## 2018-04-12 ENCOUNTER — Ambulatory Visit (HOSPITAL_COMMUNITY)
Admission: RE | Admit: 2018-04-12 | Discharge: 2018-04-12 | Disposition: A | Discharge status: Home / Self Care | Payer: Medicare Other | Source: Ambulatory Visit | Attending: Vascular Surgery | Admitting: Vascular Surgery

## 2018-04-12 ENCOUNTER — Other Ambulatory Visit: Payer: Self-pay

## 2018-04-12 VITALS — BP 111/61 | HR 72 | Temp 97.8°F | Resp 20 | Ht 64.0 in | Wt 152.0 lb

## 2018-04-12 DIAGNOSIS — R55 Syncope and collapse: Secondary | ICD-10-CM | POA: Diagnosis present

## 2018-04-12 DIAGNOSIS — I739 Peripheral vascular disease, unspecified: Secondary | ICD-10-CM

## 2018-04-12 NOTE — Progress Notes (Signed)
Patient is a 72 year old male who returns for follow-up today.  He previously underwent left femoral to below-knee popliteal bypass with non-reversed greater saphenous vein February 12, 2018.  He previously underwent right femoral to posterior tibial bypass with a distal vein patch February 2019.  He was last seen in October of this year and had some left groin swelling postoperatively.  This has improved significantly.  He is still has some lower extremity edema but this is also improving.  He states his walking is dramatically improved.  He has no rest pain he has no claudication.  He is on aspirin and a statin.  Of note he had a syncopal episode a couple of weeks ago and states that he was unconscious for about 2 hours.  He did not go to the emergency room.  He has had a couple of syncopal episodes in the past but states that he has had to recently.  He denies any palpitations.  He denies any weakness numbness of his arms or legs.  He has no slurred speech.  He denies any prior activity of seizure type.  Review of systems: He denies chest pain.  He denies shortness of breath.  Current Outpatient Medications on File Prior to Visit  Medication Sig Dispense Refill  . albuterol (PROVENTIL HFA;VENTOLIN HFA) 108 (90 Base) MCG/ACT inhaler Inhale 2 puffs into the lungs every 6 (six) hours as needed for wheezing or shortness of breath.    Marland Kitchen amLODipine (NORVASC) 5 MG tablet Take 1 tablet (5 mg total) by mouth daily. Please call [phone] to schedule annual appointment with Dr. Burt Knack 06-2017 thanks. 90 tablet 3  . aspirin (ASPIRIN LOW DOSE) 81 MG EC tablet Take 81 mg by mouth daily.      Marland Kitchen atorvastatin (LIPITOR) 40 MG tablet TAKE 1 TABLET BY MOUTH  DAILY 90 tablet 1  . calcium carbonate (OSCAL) 1500 (600 Ca) MG TABS tablet Take 600 mg of elemental calcium by mouth daily with breakfast.    . cephALEXin (KEFLEX) 500 MG capsule Take 1 capsule (500 mg total) by mouth 3 (three) times daily. For 7 days 21 capsule  0  . Cholecalciferol 5000 units TABS Take 5,000 Units by mouth daily.    . cilostazol (PLETAL) 100 MG tablet TAKE 1 TABLET BY MOUTH TWO  TIMES DAILY 180 tablet 3  . dorzolamide-timolol (COSOPT) 22.3-6.8 MG/ML ophthalmic solution Place 1 drop into the right eye 2 (two) times daily.     . IRON PO Take 1 tablet by mouth daily.    Marland Kitchen latanoprost (XALATAN) 0.005 % ophthalmic solution Place 1 drop into both eyes every morning.     . nitroGLYCERIN (NITROSTAT) 0.4 MG SL tablet Place 1 tablet (0.4 mg total) under the tongue every 5 (five) minutes as needed for chest pain. 25 tablet 3  . omeprazole (PRILOSEC OTC) 20 MG tablet Take 20 mg by mouth daily.      Marland Kitchen oxyCODONE-acetaminophen (PERCOCET/ROXICET) 5-325 MG tablet Take 1 tablet by mouth every 6 (six) hours as needed for moderate pain. 20 tablet 0  . SYMBICORT 80-4.5 MCG/ACT inhaler Inhale 2 puffs into the lungs 2 (two) times daily.     . tamsulosin (FLOMAX) 0.4 MG CAPS capsule Take 1 capsule (0.4 mg total) by mouth daily. 30 capsule 3  . vitamin B-12 (CYANOCOBALAMIN) 1000 MCG tablet Take 1,000 mcg by mouth daily.    Marland Kitchen zinc gluconate 50 MG tablet Take 50 mg by mouth daily.     No current facility-administered medications  on file prior to visit.    Past Medical History:  Diagnosis Date  . Arthritis   . Asthma   . Carpal tunnel syndrome   . Chest pain, unspecified   . Chronic kidney disease    hx of kidney stones  . Coronary artery disease   . Coronary atherosclerosis of artery bypass graft   . GERD (gastroesophageal reflux disease)   . History of kidney stones   . HOH (hard of hearing)   . Hypertension   . 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 Other and unspecified hyperlipidemia   . Personal history of unspecified circulatory disease   . Shortness of breath   . Stroke ([place])   . TIA (transient ischemic attack)     Past Surgical History:  Procedure Laterality Date  . ABDOMINAL AORTOGRAM N/A  06/14/2017   Procedure: ABDOMINAL AORTOGRAM;  Surgeon: Wellington Hampshire, MD;  Location: [place] [facility];  Service: Cardiovascular;  Laterality: N/A;  . ABDOMINAL AORTOGRAM N/A 01/26/2018   Procedure: ABDOMINAL AORTOGRAM;  Surgeon: Elam Dutch, MD;  Location: [place] [facility];  Service: Cardiovascular;  Laterality: N/A;  . BACK SURGERY     cervical disc  . CARDIAC CATHETERIZATION    . CARPAL TUNNEL RELEASE     blateral carpal tunnel  . CORONARY ARTERY BYPASS GRAFT     x5 12 yrs ago  . ENDARTERECTOMY FEMORAL Right 07/03/2017   Procedure: ENDARTERECTOMY FEMORAL RIGHT;  Surgeon: Elam Dutch, MD;  Location: [place];  Service: Vascular;  Laterality: Right;  . FEMORAL-POPLITEAL BYPASS GRAFT Right 07/03/2017   Procedure: BYPASS GRAFT FEMORAL-POPLITEAL ARTERY;  Surgeon: Elam Dutch, MD;  Location: [place];  Service: Vascular;  Laterality: Right;  . FEMORAL-POPLITEAL BYPASS GRAFT Left 02/12/2018   Procedure: BYPASS GRAFT FEMORAL-POPLITEAL ARTERY LEFT LEG;  Surgeon: Elam Dutch, MD;  Location: [place];  Service: Vascular;  Laterality: Left;  . LOWER EXTREMITY ANGIOGRAM Bilateral 08/05/2015   Procedure: Lower Extremity Angiogram;  Surgeon: Wellington Hampshire, MD;  Location: [place] [facility];  Service: Cardiovascular;  Laterality: Bilateral;  . LOWER EXTREMITY ANGIOGRAPHY Bilateral 06/14/2017   Procedure: Lower Extremity Angiography;  Surgeon: Wellington Hampshire, MD;  Location: [place] [facility];  Service: Cardiovascular;  Laterality: Bilateral;  . LOWER EXTREMITY ANGIOGRAPHY Bilateral 01/26/2018   Procedure: LOWER EXTREMITY ANGIOGRAPHY;  Surgeon: Elam Dutch, MD;  Location: [place] [facility];  Service: Cardiovascular;  Laterality: Bilateral;  . NECK SURGERY    . PATCH ANGIOPLASTY Right 07/03/2017   Procedure: VEIN PATCH ANGIOPLASTY OF FEMORAL AND POSTERIOR TIBIAL ARTERIES;  Surgeon: Elam Dutch, MD;  Location: [place];  Service: Vascular;  Laterality: Right;  . PERIPHERAL VASCULAR  CATHETERIZATION N/A 08/05/2015   Procedure: Abdominal Aortogram;  Surgeon: Wellington Hampshire, MD;  Location: [place] [facility];  Service: Cardiovascular;  Laterality: N/A;  . SHOULDER ARTHROSCOPY W/ ROTATOR CUFF REPAIR     Right  . VEIN HARVEST Left 02/12/2018   Procedure: VEIN HARVEST;  Surgeon: Elam Dutch, MD;  Location: [facility];  Service: Vascular;  Laterality: Left;    Physical exam:  Vitals:   04/12/18 1458  BP: 111/61  Pulse: 72  Resp: 20  Temp: 97.8 F (36.6 C)  TempSrc: Oral  SpO2: 98%  Weight: 152 lb (68.9 kg)  Height: 5\' 4"  (1.626 m)    Neck: No carotid bruits  Chest: Clear to auscultation bilaterally  Cardiac: Regular rate  and rhythm without murmur  Abdomen: Soft nontender  Extremities: 2+ femoral pulses absent pedal pulses bilaterally feet pink warm well-perfused all incisions completely healed  Data: Patient had a carotid duplex exam today to examine him for syncope.  This showed no significant carotid stenosis bilaterally.  Reviewed and interpreted the study.  Assessment: #1 peripheral arterial disease essentially recovered from his recent bilateral lower externally bypass operations.  Currently asymptomatic.  He will have bilateral ABIs and bilateral lower extremity duplex exam in 3 months.  2.  Recent episode of syncope.  Carotid duplex exam today shows no significant stenosis.  We will get him an appointment as soon as possible with Dr. Burt Knack to evaluate for arrhythmia as a possible etiology for his syncope.  [name], MD [facility] Office: [phone] Pager: [phone]

## 2018-04-13 ENCOUNTER — Telehealth: Payer: Self-pay

## 2018-04-13 NOTE — Telephone Encounter (Signed)
-----   Message from Sherren Mocha, MD sent at 04/13/2018  8:31 AM EST ----- thx Juanda Crumble.   Valetta Fuller can you get him in for syncope eval? ----- Message ----- From: Elam Dutch, MD Sent: 04/12/2018   4:08 PM EST To: [name], MD, Sherren Mocha, MD

## 2018-04-13 NOTE — Telephone Encounter (Signed)
Scheduled patient with Dr. Burt Knack 04/16/18 at 1500. He was grateful for call and agrees with treatment plan.

## 2018-04-16 ENCOUNTER — Encounter: Payer: Self-pay | Admitting: Cardiovascular Disease

## 2018-04-16 ENCOUNTER — Ambulatory Visit: Payer: Medicare Other | Admitting: Physician Assistant

## 2018-04-16 ENCOUNTER — Ambulatory Visit (INDEPENDENT_AMBULATORY_CARE_PROVIDER_SITE_OTHER): Payer: Medicare Other | Admitting: Cardiovascular Disease

## 2018-04-16 VITALS — BP 102/60 | HR 79 | Ht 64.0 in | Wt 151.0 lb

## 2018-04-16 DIAGNOSIS — I10 Essential (primary) hypertension: Secondary | ICD-10-CM

## 2018-04-16 DIAGNOSIS — I25810 Atherosclerosis of coronary artery bypass graft(s) without angina pectoris: Secondary | ICD-10-CM | POA: Diagnosis not present

## 2018-04-16 DIAGNOSIS — I2510 Atherosclerotic heart disease of native coronary artery without angina pectoris: Secondary | ICD-10-CM

## 2018-04-16 DIAGNOSIS — E782 Mixed hyperlipidemia: Secondary | ICD-10-CM

## 2018-04-16 DIAGNOSIS — R55 Syncope and collapse: Secondary | ICD-10-CM | POA: Diagnosis not present

## 2018-04-16 NOTE — Progress Notes (Signed)
Cardiology Office Note:    Date:  04/18/2018   ID:  Devin [name], DOB 1945/10/01, MRN [id number]  PCP:  [name], MD  Cardiologist:  No primary care provider on file.  Electrophysiologist:  None   Referring MD: [name], MD   Chief Complaint  Patient presents with  . Loss of Consciousness  . Leg Swelling    History of Present Illness:    Devin [name] is a 72 y.o. male with a hx of coronary and peripheral arterial disease, presenting for follow-up evaluation.  The patient underwent CABG remotely and most recent heart catheterization in 2007 demonstrated patent bypass grafts.  He also has a history of hyperlipidemia and tobacco use.  He has had multiple lower extremity revascularization surgeries, including femoral artery endarterectomy and right femoral to below-knee popliteal bypass.  He also underwent left femoral to below-knee bypass.  About 3 weeks ago he experienced an episode of syncope. He got up to use the bathroom and woke up on the bathroom floor. He doesn't recall a prodrome of dizziness, but doesn't remember details of the event. He did not seek medical attention at the time. He's had a few syncopal episodes in the past with no clear cause identified. He denies postural dizziness. Of note he started taking flomax a few weeks before this occurred. He's had no chest pain or dyspnea. No palpitations.   Past Medical History:  Diagnosis Date  . Arthritis   . Asthma   . Carpal tunnel syndrome   . Chest pain, unspecified   . Chronic kidney disease    hx of kidney stones  . Coronary artery disease   . Coronary atherosclerosis of artery bypass graft   . GERD (gastroesophageal reflux disease)   . History of kidney stones   . HOH (hard of hearing)   . Hypertension   . 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 Other and unspecified hyperlipidemia   . Personal history of unspecified circulatory disease   . Shortness of  breath   . Stroke ([place])   . TIA (transient ischemic attack)     Past Surgical History:  Procedure Laterality Date  . ABDOMINAL AORTOGRAM N/A 06/14/2017   Procedure: ABDOMINAL AORTOGRAM;  Surgeon: Wellington Hampshire, MD;  Location: [place] [facility];  Service: Cardiovascular;  Laterality: N/A;  . ABDOMINAL AORTOGRAM N/A 01/26/2018   Procedure: ABDOMINAL AORTOGRAM;  Surgeon: Elam Dutch, MD;  Location: [place] [facility];  Service: Cardiovascular;  Laterality: N/A;  . BACK SURGERY     cervical disc  . CARDIAC CATHETERIZATION    . CARPAL TUNNEL RELEASE     blateral carpal tunnel  . CORONARY ARTERY BYPASS GRAFT     x5 12 yrs ago  . ENDARTERECTOMY FEMORAL Right 07/03/2017   Procedure: ENDARTERECTOMY FEMORAL RIGHT;  Surgeon: Elam Dutch, MD;  Location: [place];  Service: Vascular;  Laterality: Right;  . FEMORAL-POPLITEAL BYPASS GRAFT Right 07/03/2017   Procedure: BYPASS GRAFT FEMORAL-POPLITEAL ARTERY;  Surgeon: Elam Dutch, MD;  Location: [place];  Service: Vascular;  Laterality: Right;  . FEMORAL-POPLITEAL BYPASS GRAFT Left 02/12/2018   Procedure: BYPASS GRAFT FEMORAL-POPLITEAL ARTERY LEFT LEG;  Surgeon: Elam Dutch, MD;  Location: [place];  Service: Vascular;  Laterality: Left;  . LOWER EXTREMITY ANGIOGRAM Bilateral 08/05/2015   Procedure: Lower Extremity Angiogram;  Surgeon: Wellington Hampshire, MD;  Location: [place] [facility];  Service: Cardiovascular;  Laterality: Bilateral;  .  LOWER EXTREMITY ANGIOGRAPHY Bilateral 06/14/2017   Procedure: Lower Extremity Angiography;  Surgeon: Wellington Hampshire, MD;  Location: [place] [facility];  Service: Cardiovascular;  Laterality: Bilateral;  . LOWER EXTREMITY ANGIOGRAPHY Bilateral 01/26/2018   Procedure: LOWER EXTREMITY ANGIOGRAPHY;  Surgeon: Elam Dutch, MD;  Location: [place] [facility];  Service: Cardiovascular;  Laterality: Bilateral;  . NECK SURGERY    . PATCH ANGIOPLASTY Right 07/03/2017   Procedure: VEIN PATCH ANGIOPLASTY OF  FEMORAL AND POSTERIOR TIBIAL ARTERIES;  Surgeon: Elam Dutch, MD;  Location: [place];  Service: Vascular;  Laterality: Right;  . PERIPHERAL VASCULAR CATHETERIZATION N/A 08/05/2015   Procedure: Abdominal Aortogram;  Surgeon: Wellington Hampshire, MD;  Location: [place] [facility];  Service: Cardiovascular;  Laterality: N/A;  . SHOULDER ARTHROSCOPY W/ ROTATOR CUFF REPAIR     Right  . VEIN HARVEST Left 02/12/2018   Procedure: VEIN HARVEST;  Surgeon: Elam Dutch, MD;  Location: [facility] OR;  Service: Vascular;  Laterality: Left;    Current Medications: Current Meds  Medication Sig  . albuterol (PROVENTIL HFA;VENTOLIN HFA) 108 (90 Base) MCG/ACT inhaler Inhale 2 puffs into the lungs every 6 (six) hours as needed for wheezing or shortness of breath.  Marland Kitchen amLODipine (NORVASC) 5 MG tablet Take 1 tablet (5 mg total) by mouth daily. Please call [phone] to schedule annual appointment with Dr. Burt Knack 06-2017 thanks.  Marland Kitchen aspirin (ASPIRIN LOW DOSE) 81 MG EC tablet Take 81 mg by mouth daily.    Marland Kitchen atorvastatin (LIPITOR) 40 MG tablet TAKE 1 TABLET BY MOUTH  DAILY  . calcium carbonate (OSCAL) 1500 (600 Ca) MG TABS tablet Take 600 mg of elemental calcium by mouth daily with breakfast.  . Cholecalciferol 5000 units TABS Take 5,000 Units by mouth daily.  . cilostazol (PLETAL) 100 MG tablet TAKE 1 TABLET BY MOUTH TWO  TIMES DAILY  . dorzolamide-timolol (COSOPT) 22.3-6.8 MG/ML ophthalmic solution Place 1 drop into the right eye 2 (two) times daily.   . IRON PO Take 1 tablet by mouth daily.  Marland Kitchen latanoprost (XALATAN) 0.005 % ophthalmic solution Place 1 drop into both eyes every morning.   . nitroGLYCERIN (NITROSTAT) 0.4 MG SL tablet Place 1 tablet (0.4 mg total) under the tongue every 5 (five) minutes as needed for chest pain.  Marland Kitchen omeprazole (PRILOSEC OTC) 20 MG tablet Take 20 mg by mouth daily.    . SYMBICORT 80-4.5 MCG/ACT inhaler Inhale 2 puffs into the lungs 2 (two) times daily.   . tamsulosin (FLOMAX) 0.4 MG CAPS  capsule Take 1 capsule (0.4 mg total) by mouth daily.  . vitamin B-12 (CYANOCOBALAMIN) 1000 MCG tablet Take 1,000 mcg by mouth daily.  Marland Kitchen zinc gluconate 50 MG tablet Take 50 mg by mouth daily.     Allergies:   Aspirin   Social History   Socioeconomic History  . Marital status: Married    Spouse name: Not on file  . Number of children: Not on file  . Years of education: GED  . Highest education level: Not on file  Occupational History  . Occupation: fulltime   Social Needs  . Financial resource strain: Not on file  . Food insecurity:    Worry: Not on file    Inability: Not on file  . Transportation needs:    Medical: Not on file    Non-medical: Not on file  Tobacco Use  . Smoking status: Former Smoker    Packs/day: 1.00    Years: 50.00    Pack  years: 50.00    Types: Cigarettes, Cigars    Last attempt to quit: 06/07/2017    Years since quitting: 0.8  . Smokeless tobacco: Never Used  Substance and Sexual Activity  . Alcohol use: No    Alcohol/week: 0.0 standard drinks  . Drug use: No  . Sexual activity: Yes  Lifestyle  . Physical activity:    Days per week: Not on file    Minutes per session: Not on file  . Stress: Not on file  Relationships  . Social connections:    Talks on phone: Not on file    Gets together: Not on file    Attends religious service: Not on file    Active member of club or organization: Not on file    Attends meetings of clubs or organizations: Not on file    Relationship status: Not on file  Other Topics Concern  . Not on file  Social History Narrative  . Not on file     Family History: The patient's family history includes Arthritis in his unknown relative; Asthma in his unknown relative; Coronary artery disease in his unknown relative; Heart disease in his unknown relative. There is no history of Anesthesia problems, Hypotension, Malignant hyperthermia, or Pseudochol deficiency.  ROS:   Please see the history of present illness.      Positive for chills, leg swelling, syncope. All other systems reviewed and are negative.  EKGs/Labs/Other Studies Reviewed:    The following studies were reviewed today: Myocardial perfusion study 06-21-2017: Study Highlights     Nuclear stress EF: 55%.  Blood pressure demonstrated a normal response to exercise.  There was no ST segment deviation noted during stress.  The study is normal.  This is a low risk study.  The left ventricular ejection fraction is normal (55-65%).   Normal resting and stress perfusion. No ischemia or infarction EF 55%   EKG:  EKG is not ordered today.    Recent Labs: 02/05/2018: ALT 30 02/13/2018: BUN 8; Creatinine, Ser 1.01; Hemoglobin 9.5; Platelets 139; Potassium 4.0; Sodium 138  Recent Lipid Panel No results found for: CHOL, TRIG, HDL, CHOLHDL, VLDL, LDLCALC, LDLDIRECT  Physical Exam:    VS:  BP 102/60   Pulse 79   Ht 5\' 4"  (1.626 m)   Wt 151 lb (68.5 kg)   SpO2 97%   BMI 25.92 kg/m     Wt Readings from Last 3 Encounters:  04/16/18 151 lb (68.5 kg)  04/12/18 152 lb (68.9 kg)  03/08/18 155 lb (70.3 kg)     GEN: Well nourished, well developed in no acute distress HEENT: Normal NECK: No JVD; No carotid bruits LYMPHATICS: No lymphadenopathy CARDIAC: RRR, no murmurs, rubs, gallops RESPIRATORY:  Clear to auscultation without rales, wheezing or rhonchi  ABDOMEN: Soft, non-tender, non-distended MUSCULOSKELETAL:  1+ bilateral leg edema; No deformity  SKIN: Warm and dry NEUROLOGIC:  Alert and oriented x 3 PSYCHIATRIC:  Normal affect   ASSESSMENT:    1. Syncope, unspecified syncope type   2. Coronary artery disease involving native coronary artery of native heart without angina pectoris   3. Mixed hyperlipidemia   4. Essential hypertension    PLAN:    In order of problems listed above:  1. An echo is ordered to evaluate for any structural heart disease and an event monitor will be placed to evaluate for arrhythmic cause of  syncope. Prior/recent EKG's reviewed without significant conduction disease. Possible that flomax contributed to this event. Advised to drink plenty  of fluids. 2. No anginal symptoms. Anti-anginal program reviewed and will be continued. Treated with aspirin and a statin drug. 3. On a high-intensity statin drug 4. BP controlled.   Medication Adjustments/Labs and Tests Ordered: Current medicines are reviewed at length with the patient today.  Concerns regarding medicines are outlined above.  Orders Placed This Encounter  Procedures  . LONG TERM MONITOR (3-14 DAYS)  . ECHOCARDIOGRAM COMPLETE   No orders of the defined types were placed in this encounter.   Patient Instructions  Medication Instructions:  Your provider recommends that you continue on your current medications as directed. Please refer to the Current Medication list given to you today.    Labwork: None  Testing/Procedures: Your provider has requested that you have an echocardiogram. Echocardiography is a painless test that uses sound waves to create images of your heart. It provides your doctor with information about the size and shape of your heart and how well your heart's chambers and valves are working. This procedure takes approximately one hour. There are no restrictions for this procedure.    Dr. Burt Knack recommends you wear a ZIO PATCH.   Follow-Up: Your provider wants you to follow-up in: 6 months with Dr. Burt Knack. You will receive a reminder letter in the mail two months in advance. If you don't receive a letter, please call our office to schedule the follow-up appointment.    Any Other Special Instructions Will Be Listed Below (If Applicable).     If you need a refill on your cardiac medications before your next appointment, please call your pharmacy.      Signed, Sherren Mocha, MD  04/18/2018 10:15 PM    [place]

## 2018-04-16 NOTE — Patient Instructions (Signed)
Medication Instructions:  Your provider recommends that you continue on your current medications as directed. Please refer to the Current Medication list given to you today.    Labwork: None  Testing/Procedures: Your provider has requested that you have an echocardiogram. Echocardiography is a painless test that uses sound waves to create images of your heart. It provides your doctor with information about the size and shape of your heart and how well your heart's chambers and valves are working. This procedure takes approximately one hour. There are no restrictions for this procedure.    Dr. Burt Knack recommends you wear a ZIO PATCH.   Follow-Up: Your provider wants you to follow-up in: 6 months with Dr. Burt Knack. You will receive a reminder letter in the mail two months in advance. If you don't receive a letter, please call our office to schedule the follow-up appointment.    Any Other Special Instructions Will Be Listed Below (If Applicable).     If you need a refill on your cardiac medications before your next appointment, please call your pharmacy.

## 2018-04-18 ENCOUNTER — Encounter: Payer: Self-pay | Admitting: Cardiovascular Disease

## 2018-04-23 ENCOUNTER — Other Ambulatory Visit: Payer: Self-pay | Admitting: Cardiovascular Disease

## 2018-04-30 ENCOUNTER — Ambulatory Visit (HOSPITAL_COMMUNITY): Payer: Medicare Other | Attending: Cardiovascular Disease

## 2018-04-30 ENCOUNTER — Other Ambulatory Visit: Payer: Self-pay

## 2018-04-30 ENCOUNTER — Ambulatory Visit (INDEPENDENT_AMBULATORY_CARE_PROVIDER_SITE_OTHER): Payer: Medicare Other

## 2018-04-30 DIAGNOSIS — R55 Syncope and collapse: Secondary | ICD-10-CM | POA: Diagnosis not present

## 2018-05-09 ENCOUNTER — Other Ambulatory Visit (HOSPITAL_COMMUNITY): Payer: Self-pay | Admitting: Family Medicine

## 2018-06-06 ENCOUNTER — Other Ambulatory Visit: Payer: Self-pay | Admitting: Cardiovascular Disease

## 2018-06-06 DIAGNOSIS — I739 Peripheral vascular disease, unspecified: Secondary | ICD-10-CM

## 2018-06-06 DIAGNOSIS — I25810 Atherosclerosis of coronary artery bypass graft(s) without angina pectoris: Secondary | ICD-10-CM

## 2018-06-06 DIAGNOSIS — I2510 Atherosclerotic heart disease of native coronary artery without angina pectoris: Secondary | ICD-10-CM

## 2018-06-06 MED ORDER — AMLODIPINE BESYLATE 5 MG PO TABS: 5.0000 mg | ORAL_TABLET | Freq: Every day | ORAL | 3 refills | Status: DC

## 2018-06-06 MED ORDER — NITROGLYCERIN 0.4 MG SL SUBL: 0.4000 mg | SUBLINGUAL_TABLET | SUBLINGUAL | 6 refills | Status: DC | PRN

## 2018-06-06 MED ORDER — CILOSTAZOL 100 MG PO TABS: 100.0000 mg | ORAL_TABLET | Freq: Two times a day (BID) | ORAL | 3 refills | Status: DC

## 2018-06-06 MED ORDER — ATORVASTATIN CALCIUM 40 MG PO TABS: 40.0000 mg | ORAL_TABLET | Freq: Every day | ORAL | 3 refills | Status: DC

## 2018-06-06 NOTE — Telephone Encounter (Signed)
Pt's medication was sent to pt's pharmacy as requested. Confirmation received.  °

## 2018-06-08 ENCOUNTER — Other Ambulatory Visit: Payer: Self-pay

## 2018-06-08 DIAGNOSIS — I779 Disorder of arteries and arterioles, unspecified: Secondary | ICD-10-CM

## 2018-06-08 DIAGNOSIS — I6523 Occlusion and stenosis of bilateral carotid arteries: Secondary | ICD-10-CM

## 2018-06-20 DIAGNOSIS — H2513 Age-related nuclear cataract, bilateral: Secondary | ICD-10-CM | POA: Diagnosis not present

## 2018-06-20 DIAGNOSIS — H53002 Unspecified amblyopia, left eye: Secondary | ICD-10-CM | POA: Diagnosis not present

## 2018-06-20 DIAGNOSIS — H40053 Ocular hypertension, bilateral: Secondary | ICD-10-CM | POA: Diagnosis not present

## 2018-07-05 ENCOUNTER — Ambulatory Visit (INDEPENDENT_AMBULATORY_CARE_PROVIDER_SITE_OTHER)
Admission: RE | Admit: 2018-07-05 | Discharge: 2018-07-05 | Disposition: A | Discharge status: Home / Self Care | Payer: Medicare Other | Source: Ambulatory Visit | Attending: Vascular Surgery | Admitting: Vascular Surgery

## 2018-07-05 ENCOUNTER — Ambulatory Visit (HOSPITAL_COMMUNITY)
Admission: RE | Admit: 2018-07-05 | Discharge: 2018-07-05 | Disposition: A | Discharge status: Home / Self Care | Payer: Medicare Other | Source: Ambulatory Visit | Attending: Vascular Surgery | Admitting: Vascular Surgery

## 2018-07-05 ENCOUNTER — Other Ambulatory Visit: Payer: Self-pay | Admitting: Vascular Surgery

## 2018-07-05 DIAGNOSIS — I779 Disorder of arteries and arterioles, unspecified: Secondary | ICD-10-CM | POA: Insufficient documentation

## 2018-07-05 DIAGNOSIS — I739 Peripheral vascular disease, unspecified: Secondary | ICD-10-CM

## 2018-07-05 DIAGNOSIS — I6523 Occlusion and stenosis of bilateral carotid arteries: Secondary | ICD-10-CM | POA: Diagnosis not present

## 2018-07-10 ENCOUNTER — Ambulatory Visit: Payer: Medicare Other | Admitting: Cardiovascular Disease

## 2018-07-10 ENCOUNTER — Other Ambulatory Visit: Payer: Self-pay | Admitting: *Deleted

## 2018-07-10 ENCOUNTER — Encounter: Payer: Self-pay | Admitting: Cardiovascular Disease

## 2018-07-10 VITALS — BP 129/65 | HR 70 | Ht 64.0 in | Wt 153.6 lb

## 2018-07-10 DIAGNOSIS — I739 Peripheral vascular disease, unspecified: Secondary | ICD-10-CM | POA: Diagnosis not present

## 2018-07-10 DIAGNOSIS — I10 Essential (primary) hypertension: Secondary | ICD-10-CM | POA: Diagnosis not present

## 2018-07-10 DIAGNOSIS — I2510 Atherosclerotic heart disease of native coronary artery without angina pectoris: Secondary | ICD-10-CM | POA: Diagnosis not present

## 2018-07-10 DIAGNOSIS — E785 Hyperlipidemia, unspecified: Secondary | ICD-10-CM | POA: Diagnosis not present

## 2018-07-10 DIAGNOSIS — Z720 Tobacco use: Secondary | ICD-10-CM

## 2018-07-10 NOTE — Patient Instructions (Signed)
Medication Instructions:  STOP the Tamsulosin (Flomax)  If you need a refill on your cardiac medications before your next appointment, please call your pharmacy.   Lab work: None ordered  Testing/Procedures: None ordered  Follow-Up: At [facility], you and your health needs are our priority.  As part of our continuing mission to provide you with exceptional heart care, we have created designated Provider Care Teams.  These Care Teams include your primary Cardiologist (physician) and Advanced Practice Providers (APPs -  Physician Assistants and Nurse Practitioners) who all work together to provide you with the care you need, when you need it. You will need a follow up appointment in 3 months. You may see Dr. Fletcher Anon or one of the following Advanced Practice Providers on your designated Care Team:   Kerin Ransom, PA-C Roby Lofts, [place] . Sande Rives, PA-C

## 2018-07-10 NOTE — Progress Notes (Signed)
Phone call to wife with procedure instructions. To be at [facility] admitting at 10:30 am on 07/11/2018. May have a light breakfast of egg no oil, toast and water prior to 6 am. NPO past 6 am except can take the following medications with sips of water: Amlodipine, inhalers and eye drops. Hold all others until after this procedure.  Plan to spend the night. Verbalized understanding.

## 2018-07-10 NOTE — Progress Notes (Signed)
Cardiology Office Note   Date:  07/10/2018   ID:  Devin [name], DOB February 09, 1946, MRN [id number]  PCP:  Devin Gravel, MD  Cardiologist:  Dr. Burt Knack  Chief Complaint  Patient presents with  . Follow-up    pt denied chest pain      History of Present Illness: Devin [name] is a 73 y.o. male who presents for  a follow-up visit regarding peripheral arterial disease. He has known history of CAD s/p CABG years ago, cardiac cath in 2007 showed patent grafts.  He has known history of hyperlipidemia and tobacco use. He is not diabetic.  He is known to have severe bilateral leg claudication with heavily calcified vessels.  The patient underwent right common femoral artery endarterectomy and right femoral to below-knee popliteal bypass in February of 2019 followed by left femoral to below-knee popliteal bypass with vein on September 16.   He noticed some change in the color of the right foot about 2 weeks ago.  He also noted some increased right leg pain with walking but not as much as before his surgery.  He has no open wounds or ulceration. He had recent noninvasive vascular testing with Dr. Oneida Alar which unfortunately showed an occluded bypass on the right side with a drop in ABI to 0.37.   Past Medical History:  Diagnosis Date  . Arthritis   . Asthma   . Carpal tunnel syndrome   . Chest pain, unspecified   . Chronic kidney disease    hx of kidney stones  . Coronary artery disease   . Coronary atherosclerosis of artery bypass graft   . GERD (gastroesophageal reflux disease)   . History of kidney stones   . HOH (hard of hearing)   . Hypertension   . 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 Other and unspecified hyperlipidemia   . Personal history of unspecified circulatory disease   . Shortness of breath   . Stroke ([place])   . TIA (transient ischemic attack)     Past Surgical History:  Procedure Laterality Date  . ABDOMINAL  AORTOGRAM N/A 06/14/2017   Procedure: ABDOMINAL AORTOGRAM;  Surgeon: Wellington Hampshire, MD;  Location: [place] [facility];  Service: Cardiovascular;  Laterality: N/A;  . ABDOMINAL AORTOGRAM N/A 01/26/2018   Procedure: ABDOMINAL AORTOGRAM;  Surgeon: Elam Dutch, MD;  Location: [place] [facility];  Service: Cardiovascular;  Laterality: N/A;  . BACK SURGERY     cervical disc  . CARDIAC CATHETERIZATION    . CARPAL TUNNEL RELEASE     blateral carpal tunnel  . CORONARY ARTERY BYPASS GRAFT     x5 12 yrs ago  . ENDARTERECTOMY FEMORAL Right 07/03/2017   Procedure: ENDARTERECTOMY FEMORAL RIGHT;  Surgeon: Elam Dutch, MD;  Location: [place];  Service: Vascular;  Laterality: Right;  . FEMORAL-POPLITEAL BYPASS GRAFT Right 07/03/2017   Procedure: BYPASS GRAFT FEMORAL-POPLITEAL ARTERY;  Surgeon: Elam Dutch, MD;  Location: [place];  Service: Vascular;  Laterality: Right;  . FEMORAL-POPLITEAL BYPASS GRAFT Left 02/12/2018   Procedure: BYPASS GRAFT FEMORAL-POPLITEAL ARTERY LEFT LEG;  Surgeon: Elam Dutch, MD;  Location: [place];  Service: Vascular;  Laterality: Left;  . LOWER EXTREMITY ANGIOGRAM Bilateral 08/05/2015   Procedure: Lower Extremity Angiogram;  Surgeon: Wellington Hampshire, MD;  Location: [place] [facility];  Service: Cardiovascular;  Laterality: Bilateral;  . LOWER EXTREMITY ANGIOGRAPHY Bilateral 06/14/2017   Procedure: Lower Extremity Angiography;  Surgeon:  Wellington Hampshire, MD;  Location: [place] [facility];  Service: Cardiovascular;  Laterality: Bilateral;  . LOWER EXTREMITY ANGIOGRAPHY Bilateral 01/26/2018   Procedure: LOWER EXTREMITY ANGIOGRAPHY;  Surgeon: Elam Dutch, MD;  Location: [place] [facility];  Service: Cardiovascular;  Laterality: Bilateral;  . NECK SURGERY    . PATCH ANGIOPLASTY Right 07/03/2017   Procedure: VEIN PATCH ANGIOPLASTY OF FEMORAL AND POSTERIOR TIBIAL ARTERIES;  Surgeon: Elam Dutch, MD;  Location: [place];  Service: Vascular;  Laterality: Right;  .  PERIPHERAL VASCULAR CATHETERIZATION N/A 08/05/2015   Procedure: Abdominal Aortogram;  Surgeon: Wellington Hampshire, MD;  Location: [place] [facility];  Service: Cardiovascular;  Laterality: N/A;  . SHOULDER ARTHROSCOPY W/ ROTATOR CUFF REPAIR     Right  . VEIN HARVEST Left 02/12/2018   Procedure: VEIN HARVEST;  Surgeon: Elam Dutch, MD;  Location: [facility] OR;  Service: Vascular;  Laterality: Left;     Current Outpatient Medications  Medication Sig Dispense Refill  . albuterol (PROVENTIL HFA;VENTOLIN HFA) 108 (90 Base) MCG/ACT inhaler Inhale 2 puffs into the lungs every 6 (six) hours as needed for wheezing or shortness of breath.    Marland Kitchen amLODipine (NORVASC) 5 MG tablet Take 1 tablet (5 mg total) by mouth daily. 90 tablet 3  . aspirin (ASPIRIN LOW DOSE) 81 MG EC tablet Take 81 mg by mouth daily.      Marland Kitchen atorvastatin (LIPITOR) 40 MG tablet Take 1 tablet (40 mg total) by mouth daily. 90 tablet 3  . calcium carbonate (OSCAL) 1500 (600 Ca) MG TABS tablet Take 600 mg of elemental calcium by mouth daily with breakfast.    . Cholecalciferol 5000 units TABS Take 5,000 Units by mouth daily.    . cilostazol (PLETAL) 100 MG tablet Take 1 tablet (100 mg total) by mouth 2 (two) times daily. 180 tablet 3  . dorzolamide-timolol (COSOPT) 22.3-6.8 MG/ML ophthalmic solution Place 1 drop into the right eye 2 (two) times daily.     . IRON PO Take 1 tablet by mouth daily.    Marland Kitchen latanoprost (XALATAN) 0.005 % ophthalmic solution Place 1 drop into both eyes every morning.     . nitroGLYCERIN (NITROSTAT) 0.4 MG SL tablet Place 1 tablet (0.4 mg total) under the tongue every 5 (five) minutes as needed for chest pain. 25 tablet 6  . omeprazole (PRILOSEC OTC) 20 MG tablet Take 20 mg by mouth daily.      . SYMBICORT 80-4.5 MCG/ACT inhaler Inhale 2 puffs into the lungs 2 (two) times daily.     . tamsulosin (FLOMAX) 0.4 MG CAPS capsule Take 1 capsule (0.4 mg total) by mouth daily. 30 capsule 3  . vitamin B-12 (CYANOCOBALAMIN) 1000 MCG  tablet Take 1,000 mcg by mouth daily.    Marland Kitchen zinc gluconate 50 MG tablet Take 50 mg by mouth daily.     No current facility-administered medications for this visit.     Allergies:   Aspirin    Social History:  The patient  reports that he quit smoking about 13 months ago. His smoking use included cigarettes and cigars. He has a 50.00 pack-year smoking history. He has never used smokeless tobacco. He reports that he does not drink alcohol or use drugs.   Family History:  The patient's family history includes Arthritis in his unknown relative; Asthma in his unknown relative; Coronary artery disease in his unknown relative; Heart disease in his unknown relative.    ROS:  Please see the history of present  illness.   Otherwise, review of systems are positive for none.   All other systems are reviewed and negative.    PHYSICAL EXAM: VS:  BP 129/65   Pulse 70   Ht 5\' 4"  (1.626 m)   Wt 153 lb 9.6 oz (69.7 kg)   BMI 26.37 kg/m  , BMI Body mass index is 26.37 kg/m. GEN: Well nourished, well developed, in no acute distress  HEENT: normal  Neck: no JVD, carotid bruits, or masses Cardiac: RRR with premature beats; no murmurs, rubs, or gallops, moderate bilateral leg edema Respiratory:  clear to auscultation bilaterally, normal work of breathing GI: soft, nontender, nondistended, + BS MS: no deformity or atrophy  Skin: warm and dry, no rash Neuro:  Strength and sensation are intact Psych: euthymic mood, full affect Vascular: Femoral pulse is mildly diminished bilaterally.  Distal pulses are not palpable.  He has significant dependent rubor on the right side but not the left.   EKG:  EKG is not ordered today.    Recent Labs: 02/05/2018: ALT 30 02/13/2018: BUN 8; Creatinine, Ser 1.01; Hemoglobin 9.5; Platelets 139; Potassium 4.0; Sodium 138    Lipid Panel No results found for: CHOL, TRIG, HDL, CHOLHDL, VLDL, LDLCALC, LDLDIRECT    Wt Readings from Last 3 Encounters:  07/10/18 153 lb 9.6  oz (69.7 kg)  04/16/18 151 lb (68.5 kg)  04/12/18 152 lb (68.9 kg)      Other studies Reviewed: Additional studies/ records that were reviewed today include:n/a  No flowsheet data found.    ASSESSMENT AND PLAN:  1. PAD : Status post bilateral surgical revascularization with femoropopliteal bypass.  Unfortunately, his right-sided bypass is occluded with significant drop in ABI and toe pressure.  The patient has significant dependent rubor and he is at high risk for quick progression given his known history.  I discussed the case with Dr. Oneida Alar who will get in touch with the patient to arrange for an angiogram and possible thrombolysis.  If the graft is not salvageable, I can attempt endovascular intervention using the retrograde posterior tibial access.  2. CAD status post CABG: The patient is stable and has no symptoms of angina. Continue aspirin daily.   3. Hyperlipidemia: Continue Atorvastatin with a target LDL <70.   4. Tobacco abuse: He quit smoking last year.  5.  Essential hypertension: Blood pressure is controlled on current medications.  6.  Recent syncope: Recently seen by Dr. Burt Knack and thought to be possibly due to Flomax which I discontinued today.    Disposition:   FU with me in 3 months.   Signed,  [name], MD  07/10/2018 10:16 AM    [place]

## 2018-07-11 ENCOUNTER — Other Ambulatory Visit: Payer: Self-pay

## 2018-07-11 ENCOUNTER — Inpatient Hospital Stay (HOSPITAL_COMMUNITY)
Admission: AD | Admit: 2018-07-11 | Discharge: 2018-07-17 | DRG: 252 | Disposition: A | Discharge status: Home / Self Care | Payer: Medicare Other | Attending: Vascular Surgery | Admitting: Vascular Surgery

## 2018-07-11 ENCOUNTER — Encounter (HOSPITAL_COMMUNITY)
Admission: AD | Disposition: A | Discharge status: Home / Self Care | Payer: Self-pay | Source: Home / Self Care | Attending: Vascular Surgery

## 2018-07-11 ENCOUNTER — Encounter (HOSPITAL_COMMUNITY): Payer: Self-pay | Admitting: Family Medicine

## 2018-07-11 DIAGNOSIS — T82868A Thrombosis of vascular prosthetic devices, implants and grafts, initial encounter: Secondary | ICD-10-CM | POA: Diagnosis not present

## 2018-07-11 DIAGNOSIS — I70229 Atherosclerosis of native arteries of extremities with rest pain, unspecified extremity: Secondary | ICD-10-CM | POA: Diagnosis present

## 2018-07-11 DIAGNOSIS — K219 Gastro-esophageal reflux disease without esophagitis: Secondary | ICD-10-CM | POA: Diagnosis not present

## 2018-07-11 DIAGNOSIS — I739 Peripheral vascular disease, unspecified: Secondary | ICD-10-CM | POA: Diagnosis not present

## 2018-07-11 DIAGNOSIS — E785 Hyperlipidemia, unspecified: Secondary | ICD-10-CM | POA: Diagnosis present

## 2018-07-11 DIAGNOSIS — R918 Other nonspecific abnormal finding of lung field: Secondary | ICD-10-CM | POA: Diagnosis not present

## 2018-07-11 DIAGNOSIS — Z7982 Long term (current) use of aspirin: Secondary | ICD-10-CM

## 2018-07-11 DIAGNOSIS — I10 Essential (primary) hypertension: Secondary | ICD-10-CM | POA: Diagnosis not present

## 2018-07-11 DIAGNOSIS — Z79899 Other long term (current) drug therapy: Secondary | ICD-10-CM

## 2018-07-11 DIAGNOSIS — Y832 Surgical operation with anastomosis, bypass or graft as the cause of abnormal reaction of the patient, or of later complication, without mention of misadventure at the time of the procedure: Secondary | ICD-10-CM | POA: Diagnosis present

## 2018-07-11 DIAGNOSIS — I2510 Atherosclerotic heart disease of native coronary artery without angina pectoris: Secondary | ICD-10-CM | POA: Diagnosis present

## 2018-07-11 DIAGNOSIS — Z87891 Personal history of nicotine dependence: Secondary | ICD-10-CM | POA: Diagnosis not present

## 2018-07-11 DIAGNOSIS — J690 Pneumonitis due to inhalation of food and vomit: Secondary | ICD-10-CM | POA: Diagnosis not present

## 2018-07-11 DIAGNOSIS — Z8673 Personal history of transient ischemic attack (TIA), and cerebral infarction without residual deficits: Secondary | ICD-10-CM

## 2018-07-11 DIAGNOSIS — R0600 Dyspnea, unspecified: Secondary | ICD-10-CM

## 2018-07-11 DIAGNOSIS — I998 Other disorder of circulatory system: Secondary | ICD-10-CM | POA: Diagnosis present

## 2018-07-11 DIAGNOSIS — H9190 Unspecified hearing loss, unspecified ear: Secondary | ICD-10-CM | POA: Diagnosis not present

## 2018-07-11 DIAGNOSIS — I25810 Atherosclerosis of coronary artery bypass graft(s) without angina pectoris: Secondary | ICD-10-CM | POA: Diagnosis not present

## 2018-07-11 DIAGNOSIS — Z7951 Long term (current) use of inhaled steroids: Secondary | ICD-10-CM

## 2018-07-11 DIAGNOSIS — Z886 Allergy status to analgesic agent status: Secondary | ICD-10-CM

## 2018-07-11 HISTORY — PX: ABDOMINAL AORTOGRAM: CATH118222

## 2018-07-11 HISTORY — PX: LOWER EXTREMITY ANGIOGRAPHY: CATH118251

## 2018-07-11 LAB — CBC
HCT: 41.7 % (ref 39.0–52.0)
HCT: 41.1 % (ref 39.0–52.0)
HCT: 40.1 % (ref 39.0–52.0)
HEMOGLOBIN: 12.9 g/dL — AB (ref 13.0–17.0)
Hemoglobin: 13.4 g/dL (ref 13.0–17.0)
Hemoglobin: 12.6 g/dL — ABNORMAL LOW (ref 13.0–17.0)
MCH: 30 pg (ref 26.0–34.0)
MCH: 29.8 pg (ref 26.0–34.0)
MCH: 29.9 pg (ref 26.0–34.0)
MCHC: 32.1 g/dL (ref 30.0–36.0)
MCHC: 31.4 g/dL (ref 30.0–36.0)
MCHC: 31.4 g/dL (ref 30.0–36.0)
MCV: 93.3 fL (ref 80.0–100.0)
MCV: 94.9 fL (ref 80.0–100.0)
MCV: 95 fL (ref 80.0–100.0)
Platelets: 161 10*3/uL (ref 150–400)
Platelets: 158 10*3/uL (ref 150–400)
Platelets: 153 10*3/uL (ref 150–400)
RBC: 4.47 MIL/uL (ref 4.22–5.81)
RBC: 4.33 MIL/uL (ref 4.22–5.81)
RBC: 4.22 MIL/uL (ref 4.22–5.81)
RDW: 13.3 % (ref 11.5–15.5)
RDW: 13.3 % (ref 11.5–15.5)
RDW: 13.3 % (ref 11.5–15.5)
WBC: 7.6 10*3/uL (ref 4.0–10.5)
WBC: 6.8 10*3/uL (ref 4.0–10.5)
WBC: 7.5 10*3/uL (ref 4.0–10.5)
nRBC: 0 % (ref 0.0–0.2)
nRBC: 0 % (ref 0.0–0.2)
nRBC: 0 % (ref 0.0–0.2)

## 2018-07-11 LAB — POCT I-STAT 4, (NA,K, GLUC, HGB,HCT)
Glucose, Bld: 102 mg/dL — ABNORMAL HIGH (ref 70–99)
HEMATOCRIT: 41 % (ref 39.0–52.0)
Hemoglobin: 13.9 g/dL (ref 13.0–17.0)
Potassium: 3.8 mmol/L (ref 3.5–5.1)
SODIUM: 141 mmol/L (ref 135–145)

## 2018-07-11 LAB — POCT I-STAT CREATININE: Creatinine, Ser: 0.9 mg/dL (ref 0.61–1.24)

## 2018-07-11 LAB — FIBRINOGEN
Fibrinogen: 441 mg/dL (ref 210–475)
Fibrinogen: 426 mg/dL (ref 210–475)
Fibrinogen: 407 mg/dL (ref 210–475)

## 2018-07-11 LAB — HEPARIN LEVEL (UNFRACTIONATED)
HEPARIN UNFRACTIONATED: 0.32 [IU]/mL (ref 0.30–0.70)
Heparin Unfractionated: 0.77 IU/mL — ABNORMAL HIGH (ref 0.30–0.70)
Heparin Unfractionated: 0.35 IU/mL (ref 0.30–0.70)
Heparin Unfractionated: 0.15 IU/mL — ABNORMAL LOW (ref 0.30–0.70)

## 2018-07-11 LAB — MRSA PCR SCREENING: MRSA by PCR: NEGATIVE

## 2018-07-11 SURGERY — LOWER EXTREMITY ANGIOGRAPHY
Anesthesia: LOCAL | Laterality: Right

## 2018-07-11 MED ORDER — SODIUM CHLORIDE 0.9 % IV SOLN
INTRAVENOUS | Status: DC
  Administered 2018-07-11: 09:00:00 via INTRAVENOUS

## 2018-07-11 MED ORDER — ONDANSETRON HCL 4 MG/2ML IJ SOLN: 4.0000 mg | Freq: Four times a day (QID) | INTRAMUSCULAR | Status: DC | PRN

## 2018-07-11 MED ORDER — MIDAZOLAM HCL 2 MG/2ML IJ SOLN
INTRAMUSCULAR | Status: AC
  Filled 2018-07-11: qty 2

## 2018-07-11 MED ORDER — SODIUM CHLORIDE 0.9% FLUSH: 3.0000 mL | INTRAVENOUS | Status: DC | PRN

## 2018-07-11 MED ORDER — AMLODIPINE BESYLATE 5 MG PO TABS
5.0000 mg | ORAL_TABLET | Freq: Every day | ORAL | Status: DC
  Administered 2018-07-12 – 2018-07-17 (×6): 5 mg via ORAL
  Filled 2018-07-11 (×6): qty 1

## 2018-07-11 MED ORDER — SODIUM CHLORIDE 0.9% FLUSH
3.0000 mL | Freq: Two times a day (BID) | INTRAVENOUS | Status: DC
  Administered 2018-07-13 (×2): 3 mL via INTRAVENOUS

## 2018-07-11 MED ORDER — LIDOCAINE HCL (PF) 1 % IJ SOLN
INTRAMUSCULAR | Status: DC | PRN
  Administered 2018-07-11: 15 mL

## 2018-07-11 MED ORDER — ATORVASTATIN CALCIUM 40 MG PO TABS
40.0000 mg | ORAL_TABLET | Freq: Every day | ORAL | Status: DC
  Administered 2018-07-11 – 2018-07-16 (×6): 40 mg via ORAL
  Filled 2018-07-11 (×7): qty 1

## 2018-07-11 MED ORDER — ALBUTEROL SULFATE HFA 108 (90 BASE) MCG/ACT IN AERS: 2.0000 | INHALATION_SPRAY | Freq: Four times a day (QID) | RESPIRATORY_TRACT | Status: DC | PRN

## 2018-07-11 MED ORDER — FENTANYL CITRATE (PF) 100 MCG/2ML IJ SOLN
INTRAMUSCULAR | Status: AC
  Filled 2018-07-11: qty 2

## 2018-07-11 MED ORDER — MIDAZOLAM HCL 2 MG/2ML IJ SOLN
INTRAMUSCULAR | Status: DC | PRN
  Administered 2018-07-11: 1 mg via INTRAVENOUS

## 2018-07-11 MED ORDER — HEPARIN (PORCINE) IN NACL 1000-0.9 UT/500ML-% IV SOLN
INTRAVENOUS | Status: DC | PRN
  Administered 2018-07-11 (×2): 500 mL

## 2018-07-11 MED ORDER — SODIUM CHLORIDE 0.9 % IV SOLN: 1.0000 mg/h | INTRAVENOUS | Status: DC

## 2018-07-11 MED ORDER — MORPHINE SULFATE (PF) 4 MG/ML IV SOLN
5.0000 mg | INTRAVENOUS | Status: DC | PRN
  Administered 2018-07-12: 4 mg via INTRAVENOUS
  Filled 2018-07-11: qty 2

## 2018-07-11 MED ORDER — FENTANYL CITRATE (PF) 100 MCG/2ML IJ SOLN
INTRAMUSCULAR | Status: DC | PRN
  Administered 2018-07-11: 25 ug via INTRAVENOUS

## 2018-07-11 MED ORDER — HEPARIN (PORCINE) 25000 UT/250ML-% IV SOLN
500.0000 [IU]/h | INTRAVENOUS | Status: DC
  Administered 2018-07-11: 800 [IU]/h via INTRAVENOUS
  Filled 2018-07-11 (×2): qty 250

## 2018-07-11 MED ORDER — ALBUTEROL SULFATE (2.5 MG/3ML) 0.083% IN NEBU
2.5000 mg | INHALATION_SOLUTION | Freq: Four times a day (QID) | RESPIRATORY_TRACT | Status: DC | PRN
  Administered 2018-07-12 – 2018-07-15 (×4): 2.5 mg via RESPIRATORY_TRACT
  Filled 2018-07-11 (×4): qty 3

## 2018-07-11 MED ORDER — HEPARIN (PORCINE) 25000 UT/250ML-% IV SOLN
1150.0000 [IU]/h | INTRAVENOUS | Status: DC
  Administered 2018-07-12: 1100 [IU]/h via INTRAVENOUS
  Administered 2018-07-13: 1150 [IU]/h via INTRAVENOUS
  Filled 2018-07-11 (×2): qty 250

## 2018-07-11 MED ORDER — SODIUM CHLORIDE 0.9 % IV SOLN
1.0000 mg/h | INTRAVENOUS | Status: DC
  Administered 2018-07-11: 1 mg/h
  Filled 2018-07-11 (×2): qty 10

## 2018-07-11 MED ORDER — LIDOCAINE HCL (PF) 1 % IJ SOLN
INTRAMUSCULAR | Status: AC
  Filled 2018-07-11: qty 30

## 2018-07-11 MED ORDER — SODIUM CHLORIDE 0.9 % IV SOLN
INTRAVENOUS | Status: DC
  Administered 2018-07-11 – 2018-07-13 (×6): via INTRAVENOUS

## 2018-07-11 MED ORDER — SODIUM CHLORIDE 0.9 % IV SOLN: 250.0000 mL | INTRAVENOUS | Status: DC | PRN

## 2018-07-11 MED ORDER — OMEPRAZOLE MAGNESIUM 20 MG PO TBEC: 20.0000 mg | DELAYED_RELEASE_TABLET | Freq: Every day | ORAL | Status: DC

## 2018-07-11 MED ORDER — HEPARIN (PORCINE) IN NACL 1000-0.9 UT/500ML-% IV SOLN
INTRAVENOUS | Status: AC
  Filled 2018-07-11: qty 1000

## 2018-07-11 MED ORDER — PANTOPRAZOLE SODIUM 40 MG PO TBEC
40.0000 mg | DELAYED_RELEASE_TABLET | Freq: Every day | ORAL | Status: DC
  Administered 2018-07-12 – 2018-07-17 (×6): 40 mg via ORAL
  Filled 2018-07-11 (×6): qty 1

## 2018-07-11 MED ORDER — IODIXANOL 320 MG/ML IV SOLN
INTRAVENOUS | Status: DC | PRN
  Administered 2018-07-11: 50 mL via INTRAVENOUS

## 2018-07-11 MED ORDER — MOMETASONE FURO-FORMOTEROL FUM 100-5 MCG/ACT IN AERO
2.0000 | INHALATION_SPRAY | Freq: Two times a day (BID) | RESPIRATORY_TRACT | Status: DC
  Administered 2018-07-11 – 2018-07-17 (×13): 2 via RESPIRATORY_TRACT
  Filled 2018-07-11: qty 8.8

## 2018-07-11 MED ORDER — MIDAZOLAM HCL 2 MG/2ML IJ SOLN: 1.0000 mg | INTRAMUSCULAR | Status: DC | PRN

## 2018-07-11 SURGICAL SUPPLY — 15 items
CATH INFUS 135CMX50CM (CATHETERS) ×3 IMPLANT
CATH OMNI FLUSH 5F 65CM (CATHETERS) ×3 IMPLANT
CATH QUICKCROSS .035X135CM (MICROCATHETER) ×3 IMPLANT
DEVICE TORQUE .025-.038 (MISCELLANEOUS) ×3 IMPLANT
GUIDEWIRE ANGLED .035X260CM (WIRE) ×3 IMPLANT
KIT MICROPUNCTURE NIT STIFF (SHEATH) ×3 IMPLANT
KIT PV (KITS) ×3 IMPLANT
SHEATH FLEX ANSEL ANG 6F 45CM (SHEATH) ×3 IMPLANT
SHEATH PINNACLE 5F 10CM (SHEATH) ×3 IMPLANT
SHEATH PROBE COVER 6X72 (BAG) ×3 IMPLANT
SYR MEDRAD MARK V 150ML (SYRINGE) ×3 IMPLANT
TRANSDUCER W/STOPCOCK (MISCELLANEOUS) ×3 IMPLANT
TRAY PV CATH (CUSTOM PROCEDURE TRAY) ×3 IMPLANT
WIRE BENTSON .035X145CM (WIRE) ×3 IMPLANT
WIRE ROSEN-J .035X180CM (WIRE) ×3 IMPLANT

## 2018-07-11 NOTE — Progress Notes (Signed)
ANTICOAGULATION CONSULT NOTE - Initial Consult  Pharmacy Consult for heparin Indication: VTE  Allergies  Allergen Reactions  . Aspirin Other (See Comments)    Has to take the coated aspirin    Patient Measurements: Height: 5\' 4"  (162.6 cm) Weight: 151 lb 14.4 oz (68.9 kg) IBW/kg (Calculated) : 59.2 Heparin Dosing Weight: 68.9 kg  Vital Signs: Temp: 97.6 F (36.4 C) (02/12 0835) Temp Source: Oral (02/12 0835) BP: 137/89 (02/12 1817) Pulse Rate: 77 (02/12 1817)  Labs: Recent Labs    07/11/18 0859 07/11/18 0905 07/11/18 1441 07/11/18 1817  HGB 13.9  --  13.4 12.9*  HCT 41.0  --  41.7 41.1  PLT  --   --  161 158  HEPARINUNFRC  --   --  0.77* 0.35  CREATININE  --  0.90  --   --     Estimated Creatinine Clearance: 63 mL/min (by C-G formula based on SCr of 0.9 mg/dL).   Medical History: Past Medical History:  Diagnosis Date  . Arthritis   . Asthma   . Carpal tunnel syndrome   . Chest pain, unspecified   . Chronic kidney disease    hx of kidney stones  . Coronary artery disease   . Coronary atherosclerosis of artery bypass graft   . GERD (gastroesophageal reflux disease)   . History of kidney stones   . HOH (hard of hearing)   . Hypertension   . 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 Other and unspecified hyperlipidemia   . Personal history of unspecified circulatory disease   . Shortness of breath   . Stroke ([place])   . TIA (transient ischemic attack)     Medications:  Scheduled:  . amLODipine  5 mg Oral Daily  . atorvastatin  40 mg Oral q1800  . mometasone-formoterol  2 puff Inhalation BID  . [START ON 07/12/2018] pantoprazole  40 mg Oral Daily  . sodium chloride flush  3 mL Intravenous Q12H   Infusions:  . sodium chloride    . sodium chloride 125 mL/hr at 07/11/18 1800  . heparin 800 Units/hr (07/11/18 1800)    Assessment: 73 yo M with thrombosed right common femoral to posterior tibial prosthetic  bypass. S/p unifuse catheter placement in bypass. Patient started on alteplase and heparin in the cath lab to be continued overnight for thrombolysis.   Alteplase is infusing at 1 mg/hr. Heparin infusing at 800 units/hr. Initial HL 0.35 at goal, no bleeding noted, h/h, pltc and fibrinogen stable  Goal of Therapy:  Heparin level 0.2-0.5 units/mL Monitor platelets by anticoagulation protocol: Yes   Plan:  Continue heparin rate at 800 units/hr - Check CBC, heparin level, and fibrinogen q6h Call MD if fibrinogen < 150 Monitor for bleeding  Bonnita Nasuti Pharm.D. CPP, BCPS Clinical Pharmacist [phone] 07/11/2018 8:02 PM

## 2018-07-11 NOTE — Op Note (Signed)
Patient name: Devin [name] MRN: [id number] DOB: Aug 08, 1945 Sex: male  07/11/2018 Pre-operative Diagnosis: Thrombosed right common femoral to posterior tibial prosthetic bypass Post-operative diagnosis:  Same Surgeon:  Marty Heck, MD Procedure Performed: 1.  Ultrasound-guided access of the left common femoral artery above his existing left femoral to distal bypass 2.  Aortogram 3.  Right lower extremity arteriogram with selection of third order branches 4.  Placement of UniFuse thrombolytics catheter in the right common femoral to posterior tibial prosthetic bypass with thrombolysis infusion 5.  34 minutes of monitored moderate conscious [place] time  Indications: Patient is a 73 year old male who previously underwent a right common femoral to posterior tibial bypass with prosthetic graft by Dr. Oneida Alar last year.  He recently had a duplex on 07/05/2017 that showed thrombosis of his right common femoral to posterior tibial bypass and has developed significant dependent rubor in the right foot.  He presents today for an attempt at thrombolysis of his bypass graft after risks and benefits were discussed.  Findings: Aortogram showed no significant aortoiliac disease that would be contributing to inflow as a reason for bypass failure.  There was filling of the common femoral artery, a disease profunda, as well as a proximal stump of the bypass but otherwise there was no flow in the right common femoral to posterior tibial bypass.  On right lower extremity arteriogram the posterior tibial artery did reconstitute via collaterals in the calf which is the outflow of the bypass.  After using a soft angled Glidewire to select the bypass my impression is there is a problem in the proximal portion of the graft as well as an outflow problem distally at the posterior tibial anastomosis.  A unifuse catheter was placed in bypass and thrombolysis infusion will be initiated overnight.   Procedure:  The  patient was identified in the holding area and taken to room 8.  The patient was then placed supine on the table and prepped and draped in the usual sterile fashion.  A time out was called.  Ultrasound was used to evaluate the left common femoral artery.  It was patent .  A digital ultrasound image was acquired.  A micropuncture needle was used to access the left common femoral artery above his existing bypass under ultrasound guidance.  An 018 wire was advanced without resistance and a micropuncture sheath was placed.  The 018 wire was removed and a benson wire was placed.  The micropuncture sheath was exchanged for a 5 french sheath.  Ultimately a Omni Flush catheter was advanced into the infrarenal aorta and a aortogram was obtained to ensure there was no significant aortoiliac disease contributing as an inflow problem for failure of the bypass.  I then used a Omni Flush catheter and a Bentson wire and then select the contralateral common iliac.  The Omni Flush catheter was advanced into the right common femoral artery.  I then used a Biomedical scientist and exchanged for a long 6 [place] Ansell sheath in the left groin.  The patient was then given 7000 units of IV heparin.  I then used a quick cross catheter with a soft angled glide and ultimately was able to select the bypass in the right groin and I was able to traverse all the way across the bypass relatively easily.  My impression was that there was some disease and/or thrombus in the proximal portion of the bypass and again distally there also appeared to be a second lesion in the outflow  at the posterior tibial anastomosis as a contributing factor.  At that point in time I did perform hand injections through the sheath to see if the posterior tibial reconstituted at the ankle which it did given that this was the outflow of the bypass.  I did not probe across the graft into the posterior tibial and instead elected to place a UniFuse catheter into the proximal portion  of the bypass over a Glidewire.  The inner cannula was then placed and the patient will be connected to heparin at 800 units an hour through the sheath and TPA 1 mg an hour through the UniFuse catheter in the bypass.  He will be taken to the ICU.  He tolerated procedure with no immediate complications.  Condition: Stable   Marty Heck, MD [facility] Office: [phone] Pager: [place]

## 2018-07-11 NOTE — H&P (Signed)
H&P    MRN #:  [id number]  History of Present Illness: 73 y.o. male who presents with hx peripheral arterial disease. He has known history of CAD s/p CABG years ago, cardiac cath in 2007 showed patent grafts.  He has known history of hyperlipidemia and tobacco use. He is not diabetic.  He is known to have severe bilateral leg claudication with heavily calcified vessels.  The patient underwent right common femoral artery endarterectomy and right femoral to PT bypass in February of 2019 followed by left femoral to below-knee popliteal bypass with vein on September 16.   He noticed some change in the color of the right foot about 2 weeks ago.  He also noted some increased right leg pain with walking but not as much as before his surgery.  He has no open wounds or ulceration. He had recent noninvasive vascular testing with Dr. Oneida Alar which unfortunately showed an occluded bypass on the right side with a drop in ABI to 0.37.  Past Medical History:  Diagnosis Date  . Arthritis   . Asthma   . Carpal tunnel syndrome   . Chest pain, unspecified   . Chronic kidney disease    hx of kidney stones  . Coronary artery disease   . Coronary atherosclerosis of artery bypass graft   . GERD (gastroesophageal reflux disease)   . History of kidney stones   . HOH (hard of hearing)   . Hypertension   . 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 Other and unspecified hyperlipidemia   . Personal history of unspecified circulatory disease   . Shortness of breath   . Stroke ([place])   . TIA (transient ischemic attack)     Past Surgical History:  Procedure Laterality Date  . ABDOMINAL AORTOGRAM N/A 06/14/2017   Procedure: ABDOMINAL AORTOGRAM;  Surgeon: Wellington Hampshire, MD;  Location: [facility];  Service: Cardiovascular;  Laterality: N/A;  . ABDOMINAL AORTOGRAM N/A 01/26/2018   Procedure: ABDOMINAL AORTOGRAM;  Surgeon: Elam Dutch, MD;  Location: [facility];  Service: Cardiovascular;  Laterality: N/A;  . BACK SURGERY     cervical disc  . CARDIAC CATHETERIZATION    . CARPAL TUNNEL RELEASE     blateral carpal tunnel  . CORONARY ARTERY BYPASS GRAFT     x5 12 yrs ago  . ENDARTERECTOMY FEMORAL Right 07/03/2017   Procedure: ENDARTERECTOMY FEMORAL RIGHT;  Surgeon: Elam Dutch, MD;  Location: [facility];  Service: Vascular;  Laterality: Right;  . FEMORAL-POPLITEAL BYPASS GRAFT Right 07/03/2017   Procedure: BYPASS GRAFT FEMORAL-POPLITEAL ARTERY;  Surgeon: Elam Dutch, MD;  Location: [facility];  Service: Vascular;  Laterality: Right;  . FEMORAL-POPLITEAL BYPASS GRAFT Left 02/12/2018   Procedure: BYPASS GRAFT FEMORAL-POPLITEAL ARTERY LEFT LEG;  Surgeon: Elam Dutch, MD;  Location: [facility];  Service: Vascular;  Laterality: Left;  . LOWER EXTREMITY ANGIOGRAM Bilateral 08/05/2015   Procedure: Lower Extremity Angiogram;  Surgeon: Wellington Hampshire, MD;  Location: [facility];  Service: Cardiovascular;  Laterality: Bilateral;  . LOWER EXTREMITY ANGIOGRAPHY Bilateral 06/14/2017   Procedure: Lower Extremity Angiography;  Surgeon: Wellington Hampshire, MD;  Location: [facility];  Service: Cardiovascular;  Laterality: Bilateral;  . LOWER EXTREMITY ANGIOGRAPHY Bilateral 01/26/2018   Procedure: LOWER EXTREMITY ANGIOGRAPHY;  Surgeon: Elam Dutch, MD;  Location: [facility];  Service: Cardiovascular;  Laterality: Bilateral;  . NECK SURGERY    . PATCH  ANGIOPLASTY Right 07/03/2017   Procedure: VEIN PATCH ANGIOPLASTY OF FEMORAL AND POSTERIOR TIBIAL ARTERIES;  Surgeon: Elam Dutch, MD;  Location: [facility];  Service: Vascular;  Laterality: Right;  . PERIPHERAL VASCULAR CATHETERIZATION N/A 08/05/2015   Procedure: Abdominal Aortogram;  Surgeon: Wellington Hampshire, MD;  Location: [facility];  Service: Cardiovascular;  Laterality: N/A;  . SHOULDER ARTHROSCOPY W/ ROTATOR CUFF REPAIR     Right  . VEIN HARVEST Left 02/12/2018    Procedure: VEIN HARVEST;  Surgeon: Elam Dutch, MD;  Location: [facility];  Service: Vascular;  Laterality: Left;    Allergies  Allergen Reactions  . Aspirin Other (See Comments)    Has to take the coated aspirin    Prior to Admission medications   Medication Sig Start Date End Date Taking? Authorizing Provider  albuterol (PROVENTIL HFA;VENTOLIN HFA) 108 (90 Base) MCG/ACT inhaler Inhale 2 puffs into the lungs every 6 (six) hours as needed for wheezing or shortness of breath.   Yes [provider]  amLODipine (NORVASC) 5 MG tablet Take 1 tablet (5 mg total) by mouth daily. 06/06/18  Yes Sherren Mocha, MD  aspirin (ASPIRIN LOW DOSE) 81 MG EC tablet Take 81 mg by mouth daily.     Yes [provider]  atorvastatin (LIPITOR) 40 MG tablet Take 1 tablet (40 mg total) by mouth daily. 06/06/18  Yes Sherren Mocha, MD  calcium carbonate (OSCAL) 1500 (600 Ca) MG TABS tablet Take 600 mg of elemental calcium by mouth daily with breakfast.   Yes [provider]  Cholecalciferol 5000 units TABS Take 5,000 Units by mouth daily.   Yes [provider]  cilostazol (PLETAL) 100 MG tablet Take 1 tablet (100 mg total) by mouth 2 (two) times daily. 06/06/18  Yes Sherren Mocha, MD  dorzolamide-timolol (COSOPT) 22.3-6.8 MG/ML ophthalmic solution Place 1 drop into the right eye 2 (two) times daily.    Yes [provider]  IRON PO Take 1 tablet by mouth daily.   Yes [provider]  nitroGLYCERIN (NITROSTAT) 0.4 MG SL tablet Place 1 tablet (0.4 mg total) under the tongue every 5 (five) minutes as needed for chest pain. 06/06/18  Yes Sherren Mocha, MD  omeprazole (PRILOSEC OTC) 20 MG tablet Take 20 mg by mouth daily.     Yes [provider]  SYMBICORT 80-4.5 MCG/ACT inhaler Inhale 2 puffs into the lungs 2 (two) times daily.  10/17/17  Yes [provider]  vitamin B-12 (CYANOCOBALAMIN) 1000 MCG tablet Take 1,000 mcg by mouth daily.   Yes [provider]  zinc gluconate 50 MG tablet Take 50 mg by mouth daily.   Yes [provider]    Social History   Socioeconomic History  . Marital status: Married    Spouse name: Not on file  . Number of children: Not on file  . Years of education: GED  . Highest education level: Not on file  Occupational History  . Occupation: fulltime   Social Needs  . Financial resource strain: Not on file  . Food insecurity:    Worry: Not on file    Inability: Not on file  . Transportation needs:    Medical: Not on file    Non-medical: Not on file  Tobacco Use  . Smoking status: Former Smoker    Packs/day: 1.00    Years: 50.00    Pack years: 50.00    Types: Cigarettes, Cigars    Last attempt to quit: 06/07/2017  Years since quitting: 1.0  . Smokeless tobacco: Never Used  Substance and Sexual Activity  . Alcohol use: No    Alcohol/week: 0.0 standard drinks  . Drug use: No  . Sexual activity: Yes  Lifestyle  . Physical activity:    Days per week: Not on file    Minutes per session: Not on file  . Stress: Not on file  Relationships  . Social connections:    Talks on phone: Not on file    Gets together: Not on file    Attends religious service: Not on file    Active member of club or organization: Not on file    Attends meetings of clubs or organizations: Not on file    Relationship status: Not on file  . Intimate partner violence:    Fear of current or ex partner: Not on file    Emotionally abused: Not on file    Physically abused: Not on file    Forced sexual activity: Not on file  Other Topics Concern  . Not on file  Social History Narrative  . Not on file     Family History  Problem Relation Age of Onset  . Heart disease Unknown   . Arthritis Unknown   . Asthma Unknown   . Coronary artery disease Unknown   . Anesthesia problems Neg Hx   . Hypotension Neg Hx   . Malignant hyperthermia Neg Hx   . Pseudochol deficiency Neg Hx     ROS: [x]  Positive   [  ] Negative   [ ]  All sytems reviewed and are negative  Cardiovascular: []  chest pain/pressure []  palpitations []  SOB lying flat []  DOE []  pain in legs while walking []  pain in legs at rest []  pain in legs at night []  non-healing ulcers []  hx of DVT []  swelling in legs  Pulmonary: []  productive cough []  asthma/wheezing []  home O2  Neurologic: []  weakness in []  arms []  legs []  numbness in []  arms []  legs []  hx of CVA []  mini stroke [] difficulty speaking or slurred speech []  temporary loss of vision in one eye []  dizziness  Hematologic: []  hx of cancer []  bleeding problems []  problems with blood clotting easily  Endocrine:   []  diabetes []  thyroid disease  GI []  vomiting blood []  blood in stool  GU: []  CKD/renal failure []  HD--[]  M/W/F or []  T/T/S []  burning with urination []  blood in urine  Psychiatric: []  anxiety []  depression  Musculoskeletal: []  arthritis []  joint pain  Integumentary: []  rashes []  ulcers  Constitutional: []  fever []  chills   Physical Examination  Vitals:   07/11/18 0835  BP: 125/68  Pulse: 72  Resp: 18  Temp: 97.6 F (36.4 C)  SpO2: 100%   Body mass index is 26.09 kg/m.  GEN: Well nourished, well developed, in no acute distress  HEENT: normal  Neck: no JVD, carotid bruits, or masses Cardiac: RRR with premature beats; no murmurs, rubs, or gallops, moderate bilateral leg edema Respiratory:  clear to auscultation bilaterally, normal work of breathing GI: soft, nontender, nondistended, + BS MS: no deformity or atrophy  Skin: warm and dry, no rash Neuro:  Strength and sensation are intact Psych: euthymic mood, full affect Vascular: Femoral pulse is mildly diminished bilaterally.  Distal pulses are not palpable.  He has significant dependent rubor on the right side but not the left.   CBC    Component Value Date/Time   WBC 7.6 02/13/2018 0337   RBC 3.06 (L)  02/13/2018 0337   HGB 13.9 07/11/2018 0859   HGB 13.7  06/09/2017 0935   HCT 41.0 07/11/2018 0859   HCT 39.9 06/09/2017 0935   PLT 139 (L) 02/13/2018 0337   PLT 173 06/09/2017 0935   MCV 98.4 02/13/2018 0337   MCV 93 06/09/2017 0935   MCH 31.0 02/13/2018 0337   MCHC 31.6 02/13/2018 0337   RDW 14.0 02/13/2018 0337   RDW 14.2 06/09/2017 0935    BMET    Component Value Date/Time   NA 141 07/11/2018 0859   NA 145 (H) 06/09/2017 0935   K 3.8 07/11/2018 0859   CL 108 02/13/2018 0337   CO2 24 02/13/2018 0337   GLUCOSE 102 (H) 07/11/2018 0859   BUN 8 02/13/2018 0337   BUN 8 06/09/2017 0935   CREATININE 0.90 07/11/2018 0905   CREATININE 0.93 07/28/2015 1034   CALCIUM 7.9 (L) 02/13/2018 0337   GFRNONAA >60 02/13/2018 0337   GFRAA >60 02/13/2018 0337    COAGS: Lab Results  Component Value Date   INR 1.01 02/05/2018   INR 0.97 06/27/2017   INR 1.0 06/09/2017     Non-Invasive Vascular Imaging:    None   ASSESSMENT/PLAN: This is a 73 y.o. male with suspected thrombosed right CFA to PT bypass with prosthetic performed by Dr. Oneida Alar on 01/26/18.  Plan for aortogram/right leg arteriogram/attempt to lyse bypass open.  Risks and benefits discussed with patient.  No contraindications after talking with patient.  Marty Heck, MD [facility] Office: [phone] Pager: [phone]

## 2018-07-11 NOTE — Progress Notes (Addendum)
ANTICOAGULATION CONSULT NOTE - Initial Consult  Pharmacy Consult for heparin Indication: VTE  Allergies  Allergen Reactions  . Aspirin Other (See Comments)    Has to take the coated aspirin    Patient Measurements: Height: 5\' 4"  (162.6 cm) Weight: 152 lb (68.9 kg) IBW/kg (Calculated) : 59.2 Heparin Dosing Weight: 68.9 kg  Vital Signs: Temp: 97.6 F (36.4 C) (02/12 0835) Temp Source: Oral (02/12 0835) BP: 152/68 (02/12 1450) Pulse Rate: 71 (02/12 1450)  Labs: Recent Labs    07/11/18 0859 07/11/18 0905  HGB 13.9  --   HCT 41.0  --   CREATININE  --  0.90    Estimated Creatinine Clearance: 63 mL/min (by C-G formula based on SCr of 0.9 mg/dL).   Medical History: Past Medical History:  Diagnosis Date  . Arthritis   . Asthma   . Carpal tunnel syndrome   . Chest pain, unspecified   . Chronic kidney disease    hx of kidney stones  . Coronary artery disease   . Coronary atherosclerosis of artery bypass graft   . GERD (gastroesophageal reflux disease)   . History of kidney stones   . HOH (hard of hearing)   . Hypertension   . 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 Other and unspecified hyperlipidemia   . Personal history of unspecified circulatory disease   . Shortness of breath   . Stroke ([place])   . TIA (transient ischemic attack)     Medications:  Scheduled:   Infusions:  . sodium chloride 100 mL/hr at 07/11/18 0856  . sodium chloride    . alteplase (LIMB ISCHEMIA) 10 mg in normal saline (0.02 mg/mL) infusion 1 mg/hr (07/11/18 1415)  . alteplase (LIMB ISCHEMIA) 10 mg in normal saline (0.02 mg/mL) infusion    . heparin 800 Units/hr (07/11/18 1417)  . heparin      Assessment: 73 yo M with thrombosed right common femoral to posterior tibial prosthetic bypass. S/p unifuse catheter placement in bypass. Patient started on alteplase and heparin in the cath lab to be continued overnight for thrombolysis.   Alteplase  is infusing at 1 mg/hr. Heparin infusing at 800 units/hr.  Goal of Therapy:  Heparin level 0.2-0.5 units/mL Monitor platelets by anticoagulation protocol: Yes   Plan:  Continue heparin rate at 800 units/hr - dosing per MD Check CBC, heparin level, and fibrinogen q6h Monitor for bleeding  Vertis Kelch, PharmD PGY1 Pharmacy Resident Phone [phone] 07/11/2018       3:06 PM

## 2018-07-11 NOTE — Progress Notes (Signed)
Pt leaves cath lab holding area in stable condition. Lt groin is unremarkable. Family updated and given  Pt room number and phone number to the 2heart unit.

## 2018-07-12 ENCOUNTER — Inpatient Hospital Stay (HOSPITAL_COMMUNITY)
Admission: AD | Disposition: A | Discharge status: Home / Self Care | Payer: Self-pay | Source: Home / Self Care | Attending: Vascular Surgery

## 2018-07-12 ENCOUNTER — Ambulatory Visit (HOSPITAL_COMMUNITY): Admission: RE | Admit: 2018-07-12 | Payer: Medicare Other | Source: Home / Self Care | Admitting: Vascular Surgery

## 2018-07-12 ENCOUNTER — Encounter (HOSPITAL_COMMUNITY): Payer: Self-pay | Admitting: Vascular Surgery

## 2018-07-12 HISTORY — PX: PERIPHERAL VASCULAR BALLOON ANGIOPLASTY: CATH118281

## 2018-07-12 HISTORY — PX: PERIPHERAL VASCULAR THROMBECTOMY: CATH118306

## 2018-07-12 LAB — HEPARIN LEVEL (UNFRACTIONATED)
Heparin Unfractionated: <0.1 IU/mL — ABNORMAL LOW (ref 0.30–0.70)
Heparin Unfractionated: 0.46 IU/mL (ref 0.30–0.70)

## 2018-07-12 LAB — CBC
HCT: 38.9 % — ABNORMAL LOW (ref 39.0–52.0)
HCT: 36.5 % — ABNORMAL LOW (ref 39.0–52.0)
HEMOGLOBIN: 12.7 g/dL — AB (ref 13.0–17.0)
Hemoglobin: 11.9 g/dL — ABNORMAL LOW (ref 13.0–17.0)
MCH: 31 pg (ref 26.0–34.0)
MCH: 30.5 pg (ref 26.0–34.0)
MCHC: 32.6 g/dL (ref 30.0–36.0)
MCHC: 32.6 g/dL (ref 30.0–36.0)
MCV: 94.9 fL (ref 80.0–100.0)
MCV: 93.6 fL (ref 80.0–100.0)
Platelets: 153 10*3/uL (ref 150–400)
Platelets: UNDETERMINED 10*3/uL (ref 150–400)
RBC: 4.1 MIL/uL — AB (ref 4.22–5.81)
RBC: 3.9 MIL/uL — ABNORMAL LOW (ref 4.22–5.81)
RDW: 13.3 % (ref 11.5–15.5)
RDW: 13.2 % (ref 11.5–15.5)
WBC: 8.5 10*3/uL (ref 4.0–10.5)
WBC: 6.6 10*3/uL (ref 4.0–10.5)
nRBC: 0 % (ref 0.0–0.2)
nRBC: 0 % (ref 0.0–0.2)

## 2018-07-12 LAB — BASIC METABOLIC PANEL
Anion gap: 10 (ref 5–15)
Anion gap: 7 (ref 5–15)
BUN: 7 mg/dL — ABNORMAL LOW (ref 8–23)
BUN: 8 mg/dL (ref 8–23)
CO2: 14 mmol/L — ABNORMAL LOW (ref 22–32)
CO2: 19 mmol/L — ABNORMAL LOW (ref 22–32)
Calcium: 6.6 mg/dL — ABNORMAL LOW (ref 8.9–10.3)
Calcium: 7.7 mg/dL — ABNORMAL LOW (ref 8.9–10.3)
Chloride: 117 mmol/L — ABNORMAL HIGH (ref 98–111)
Chloride: 108 mmol/L (ref 98–111)
Creatinine, Ser: 0.7 mg/dL (ref 0.61–1.24)
Creatinine, Ser: 0.88 mg/dL (ref 0.61–1.24)
GFR calc Af Amer: >60 mL/min (ref >60)
GFR calc non Af Amer: >60 mL/min (ref >60)
GFR calc non Af Amer: >60 mL/min (ref >60)
Glucose, Bld: 70 mg/dL (ref 70–99)
Glucose, Bld: 99 mg/dL (ref 70–99)
POTASSIUM: 2.9 mmol/L — AB (ref 3.5–5.1)
Potassium: 3.3 mmol/L — ABNORMAL LOW (ref 3.5–5.1)
SODIUM: 141 mmol/L (ref 135–145)
Sodium: 134 mmol/L — ABNORMAL LOW (ref 135–145)

## 2018-07-12 LAB — POCT ACTIVATED CLOTTING TIME: Activated Clotting Time: 213 seconds

## 2018-07-12 LAB — FIBRINOGEN
Fibrinogen: 414 mg/dL (ref 210–475)
Fibrinogen: 392 mg/dL (ref 210–475)

## 2018-07-12 SURGERY — PERIPHERAL VASCULAR THROMBECTOMY
Anesthesia: LOCAL | Laterality: Right

## 2018-07-12 MED ORDER — NITROGLYCERIN 1 MG/10 ML FOR IR/CATH LAB
INTRA_ARTERIAL | Status: AC
  Filled 2018-07-12: qty 10

## 2018-07-12 MED ORDER — FENTANYL CITRATE (PF) 100 MCG/2ML IJ SOLN
INTRAMUSCULAR | Status: DC | PRN
  Administered 2018-07-12 (×2): 25 ug via INTRAVENOUS

## 2018-07-12 MED ORDER — IODIXANOL 320 MG/ML IV SOLN
INTRAVENOUS | Status: DC | PRN
  Administered 2018-07-12: 50 mL via INTRA_ARTERIAL

## 2018-07-12 MED ORDER — MIDAZOLAM HCL 2 MG/2ML IJ SOLN
INTRAMUSCULAR | Status: AC
  Filled 2018-07-12: qty 2

## 2018-07-12 MED ORDER — HEPARIN SODIUM (PORCINE) 1000 UNIT/ML IJ SOLN
INTRAMUSCULAR | Status: AC
  Filled 2018-07-12: qty 1

## 2018-07-12 MED ORDER — LIDOCAINE HCL (PF) 1 % IJ SOLN
INTRAMUSCULAR | Status: AC
  Filled 2018-07-12: qty 30

## 2018-07-12 MED ORDER — MIDAZOLAM HCL 2 MG/2ML IJ SOLN
INTRAMUSCULAR | Status: DC | PRN
  Administered 2018-07-12 (×2): 1 mg via INTRAVENOUS

## 2018-07-12 MED ORDER — ACETAMINOPHEN 325 MG PO TABS
650.0000 mg | ORAL_TABLET | Freq: Four times a day (QID) | ORAL | Status: DC | PRN
  Administered 2018-07-12: 650 mg via ORAL
  Filled 2018-07-12: qty 2

## 2018-07-12 MED ORDER — LIDOCAINE HCL (PF) 1 % IJ SOLN
INTRAMUSCULAR | Status: DC | PRN
  Administered 2018-07-12: 15 mL via INTRADERMAL

## 2018-07-12 MED ORDER — HEPARIN SODIUM (PORCINE) 1000 UNIT/ML IJ SOLN
INTRAMUSCULAR | Status: DC | PRN
  Administered 2018-07-12: 4000 [IU] via INTRAVENOUS
  Administered 2018-07-12: 3000 [IU] via INTRAVENOUS

## 2018-07-12 MED ORDER — HEPARIN (PORCINE) IN NACL 1000-0.9 UT/500ML-% IV SOLN
INTRAVENOUS | Status: DC | PRN
  Administered 2018-07-12: 500 mL

## 2018-07-12 MED ORDER — FENTANYL CITRATE (PF) 100 MCG/2ML IJ SOLN
INTRAMUSCULAR | Status: AC
  Filled 2018-07-12: qty 2

## 2018-07-12 MED ORDER — POTASSIUM CHLORIDE CRYS ER 20 MEQ PO TBCR
40.0000 meq | EXTENDED_RELEASE_TABLET | Freq: Three times a day (TID) | ORAL | Status: DC
  Administered 2018-07-12 – 2018-07-17 (×14): 40 meq via ORAL
  Filled 2018-07-12 (×14): qty 2

## 2018-07-12 MED ORDER — SODIUM CHLORIDE 0.9 % IV SOLN
1.0000 mg/h | INTRAVENOUS | Status: DC
  Administered 2018-07-12 – 2018-07-13 (×4): 1 mg/h
  Filled 2018-07-12 (×8): qty 10

## 2018-07-12 SURGICAL SUPPLY — 13 items
BAG SNAP BAND KOVER 36X36 (MISCELLANEOUS) ×3 IMPLANT
BALLN MUSTANG 6X60X135 (BALLOONS) ×3
BALLN STERLING OTW 3X150X150 (BALLOONS) ×3
BALLN STERLING OTW 3X40X150 (BALLOONS) ×3
BALLOON MUSTANG 6X60X135 (BALLOONS) ×2 IMPLANT
BALLOON STERLING OTW 3X150X150 (BALLOONS) ×2 IMPLANT
BALLOON STERLING OTW 3X40X150 (BALLOONS) ×2 IMPLANT
CATH CXI SUPP 2.6F 150 ST (CATHETERS) ×3 IMPLANT
CATH INFUS 135CMX50CM (CATHETERS) ×3 IMPLANT
COVER DOME SNAP 22 D (MISCELLANEOUS) ×3 IMPLANT
KIT ENCORE 26 ADVANTAGE (KITS) ×3 IMPLANT
TRAY PV CATH (CUSTOM PROCEDURE TRAY) ×3 IMPLANT
WIRE G V18X300CM (WIRE) ×3 IMPLANT

## 2018-07-12 NOTE — Plan of Care (Signed)
  Problem: Education: Goal: Knowledge of prescribed regimen will improve Outcome: Progressing   Problem: Clinical Measurements: Goal: Postoperative complications will be avoided or minimized Outcome: Progressing   Problem: Clinical Measurements: Goal: Ability to maintain clinical measurements within normal limits will improve Outcome: Progressing Goal: Will remain free from infection Outcome: Progressing Goal: Respiratory complications will improve Outcome: Progressing Goal: Cardiovascular complication will be avoided Outcome: Progressing

## 2018-07-12 NOTE — Progress Notes (Signed)
[facility] for Heparin  Indication: VTE, currently undergoing lysis with alteplase  Allergies  Allergen Reactions  . Aspirin Other (See Comments)    Has to take the coated aspirin    Patient Measurements: Height: 5\' 4"  (162.6 cm) Weight: 151 lb 14.4 oz (68.9 kg) IBW/kg (Calculated) : 59.2 Heparin Dosing Weight: 68.9 kg  Vital Signs: Temp: 98.3 F (36.8 C) (02/13 0740) Temp Source: Oral (02/13 0740) BP: 135/76 (02/13 1241) Pulse Rate: 87 (02/13 1241)  Labs: Recent Labs    07/11/18 0905  07/11/18 1817 07/11/18 2105 07/11/18 2108 07/12/18 0300 07/12/18 0308 07/12/18 0810  HGB  --    < > 12.9* 12.6*  --  12.7*  --   --   HCT  --    < > 41.1 40.1  --  38.9*  --   --   PLT  --    < > 158 153  --  153  --   --   HEPARINUNFRC  --    < > 0.32  0.35  --  0.15*  --  <0.10*  --   CREATININE 0.90  --   --   --   --   --   --  0.70   < > = values in this interval not displayed.    Estimated Creatinine Clearance: 70.9 mL/min (by C-G formula based on SCr of 0.7 mg/dL).   Medical History: Past Medical History:  Diagnosis Date  . Arthritis   . Asthma   . Carpal tunnel syndrome   . Chest pain, unspecified   . Chronic kidney disease    hx of kidney stones  . Coronary artery disease   . Coronary atherosclerosis of artery bypass graft   . GERD (gastroesophageal reflux disease)   . History of kidney stones   . HOH (hard of hearing)   . Hypertension   . 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 Other and unspecified hyperlipidemia   . Personal history of unspecified circulatory disease   . Shortness of breath   . Stroke ([place])   . TIA (transient ischemic attack)     Medications:  Scheduled:  . amLODipine  5 mg Oral Daily  . atorvastatin  40 mg Oral q1800  . mometasone-formoterol  2 puff Inhalation BID  . pantoprazole  40 mg Oral Daily  . sodium chloride flush  3 mL Intravenous Q12H    Infusions:  . sodium chloride    . sodium chloride 125 mL/hr at 07/12/18 0859  . alteplase (LIMB ISCHEMIA) 10 mg in normal saline (0.02 mg/mL) infusion 1 mg/hr (07/12/18 1243)  . heparin 1,100 Units/hr (07/12/18 0900)    Assessment: 73 yo M with thrombosed right common femoral to posterior tibial prosthetic bypass. S/p unifuse catheter placement in bypass. Patient started on alteplase and heparin in the cath lab now s/p angioplasty with plans for continued thrombolysis. Alteplase and heparin were restarted post procedure today -Alteplase is infusing at 1 mg/hr, heparin running at 1100 units/hr -hg= 12.7, plt= 153, fibrinogen= 213   Goal of Therapy:  Heparin level 0.2-0.5 units/mL Monitor platelets by anticoagulation protocol: Yes   Plan:  Continue heparin at 1100 units/hr Heparin level in 6 hours and daily wth CBC daily  Call MD if fibrinogen < 150  Hildred Laser, PharmD Clinical Pharmacist **Pharmacist phone directory can now be found on [email or web address] ([email or web address]).  Listed under [place].

## 2018-07-12 NOTE — Progress Notes (Signed)
[facility] [place]  Subjective  - Right leg graft thrombolysis overnight.  States he can't tell if it feels better.   Objective (!) 142/77 (!) 101 98 F (36.7 C) (Oral) (!) 22 (!) 87%  Intake/Output Summary (Last 24 hours) at 07/12/2018 0736 Last data filed at 07/12/2018 0600 Gross per 24 hour  Intake 2906.82 ml  Output 1970 ml  Net 936.82 ml    Right PT monophasic signal - motor and sensory intact  Laboratory Lab Results: Recent Labs    07/11/18 2105 07/12/18 0300  WBC 7.5 8.5  HGB 12.6* 12.7*  HCT 40.1 38.9*  PLT 153 153   BMET Recent Labs    07/11/18 0859 07/11/18 0905  NA 141  --   K 3.8  --   GLUCOSE 102*  --   CREATININE  --  0.90    COAG Lab Results  Component Value Date   INR 1.01 02/05/2018   INR 0.97 06/27/2017   INR 1.0 06/09/2017   No results found for: PTT  Assessment/Planning:  Return to [facility] lab today for right leg thrombolytics catheter check.  Thrombolysis catheter placed in right common femoral to PT graft overnight.  Marty Heck 07/12/2018 7:36 AM --

## 2018-07-12 NOTE — Progress Notes (Signed)
[place] for Heparin  Indication: VTE, currently undergoing lysis with alteplase  Allergies  Allergen Reactions  . Aspirin Other (See Comments)    Has to take the coated aspirin    Patient Measurements: Height: 5\' 4"  (162.6 cm) Weight: 151 lb 14.4 oz (68.9 kg) IBW/kg (Calculated) : 59.2 Heparin Dosing Weight: 68.9 kg  Vital Signs: Temp: 98.6 F (37 C) (02/13 0000) Temp Source: Oral (02/13 0000) BP: 140/67 (02/13 0300) Pulse Rate: 78 (02/13 0300)  Labs: Recent Labs    07/11/18 0905  07/11/18 1817 07/11/18 2105 07/11/18 2108 07/12/18 0300 07/12/18 0308  HGB  --    < > 12.9* 12.6*  --  12.7*  --   HCT  --    < > 41.1 40.1  --  38.9*  --   PLT  --    < > 158 153  --  153  --   HEPARINUNFRC  --    < > 0.32  0.35  --  0.15*  --  <0.10*  CREATININE 0.90  --   --   --   --   --   --    < > = values in this interval not displayed.    Estimated Creatinine Clearance: 63 mL/min (by C-G formula based on SCr of 0.9 mg/dL).   Medical History: Past Medical History:  Diagnosis Date  . Arthritis   . Asthma   . Carpal tunnel syndrome   . Chest pain, unspecified   . Chronic kidney disease    hx of kidney stones  . Coronary artery disease   . Coronary atherosclerosis of artery bypass graft   . GERD (gastroesophageal reflux disease)   . History of kidney stones   . HOH (hard of hearing)   . Hypertension   . 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 Other and unspecified hyperlipidemia   . Personal history of unspecified circulatory disease   . Shortness of breath   . Stroke ([place])   . TIA (transient ischemic attack)     Medications:  Scheduled:  . amLODipine  5 mg Oral Daily  . atorvastatin  40 mg Oral q1800  . mometasone-formoterol  2 puff Inhalation BID  . pantoprazole  40 mg Oral Daily  . sodium chloride flush  3 mL Intravenous Q12H   Infusions:  . sodium chloride    . sodium  chloride 125 mL/hr at 07/12/18 0113  . alteplase (LIMB ISCHEMIA) 10 mg in normal saline (0.02 mg/mL) infusion 1 mg/hr (07/12/18 0114)  . heparin 900 Units/hr (07/12/18 0300)    Assessment: 73 yo M with thrombosed right common femoral to posterior tibial prosthetic bypass. S/p unifuse catheter placement in bypass. Patient started on alteplase and heparin in the cath lab to be continued overnight for thrombolysis.   Alteplase is infusing at 1 mg/hr  Heparin level is low this AM, no issues per RN.   Goal of Therapy:  Heparin level 0.2-0.5 units/mL Monitor platelets by anticoagulation protocol: Yes   Plan:  Inc heparin to 1100 units/hr Re-check heparin level in 6 hours  Call MD if fibrinogen < 150 Monitor for bleeding  Narda Bonds, PharmD, BCPS Clinical Pharmacist Phone: [phone]

## 2018-07-12 NOTE — Progress Notes (Signed)
[name] for Heparin  Indication: Occluded bypass, currently undergoing lysis with alteplase  Allergies  Allergen Reactions  . Aspirin Other (See Comments)    Has to take the coated aspirin    Patient Measurements: Height: 5\' 4"  (162.6 cm) Weight: 151 lb 14.4 oz (68.9 kg) IBW/kg (Calculated) : 59.2 Heparin Dosing Weight: 68.9 kg  Vital Signs: Temp: 98.3 F (36.8 C) (02/13 0740) Temp Source: Oral (02/13 0740) BP: 135/76 (02/13 1241) Pulse Rate: 87 (02/13 1241)  Labs: Recent Labs    07/11/18 0905  07/11/18 1817 07/11/18 2105 07/11/18 2108 07/12/18 0300 07/12/18 0308 07/12/18 0810  HGB  --    < > 12.9* 12.6*  --  12.7*  --   --   HCT  --    < > 41.1 40.1  --  38.9*  --   --   PLT  --    < > 158 153  --  153  --   --   HEPARINUNFRC  --    < > 0.32  0.35  --  0.15*  --  <0.10*  --   CREATININE 0.90  --   --   --   --   --   --  0.70   < > = values in this interval not displayed.    Estimated Creatinine Clearance: 70.9 mL/min (by C-G formula based on SCr of 0.7 mg/dL).   Medical History: Past Medical History:  Diagnosis Date  . Arthritis   . Asthma   . Carpal tunnel syndrome   . Chest pain, unspecified   . Chronic kidney disease    hx of kidney stones  . Coronary artery disease   . Coronary atherosclerosis of artery bypass graft   . GERD (gastroesophageal reflux disease)   . History of kidney stones   . HOH (hard of hearing)   . Hypertension   . 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 Other and unspecified hyperlipidemia   . Personal history of unspecified circulatory disease   . Shortness of breath   . Stroke ([place])   . TIA (transient ischemic attack)     Medications:  Scheduled:  . amLODipine  5 mg Oral Daily  . atorvastatin  40 mg Oral q1800  . mometasone-formoterol  2 puff Inhalation BID  . pantoprazole  40 mg Oral Daily  . sodium chloride flush  3 mL Intravenous  Q12H   Infusions:  . sodium chloride    . sodium chloride 125 mL/hr at 07/12/18 0859  . alteplase (LIMB ISCHEMIA) 10 mg in normal saline (0.02 mg/mL) infusion 1 mg/hr (07/12/18 1243)  . heparin 1,100 Units/hr (07/12/18 0900)    Assessment: 73 yo M with thrombosed right common femoral to posterior tibial prosthetic bypass. S/p unifuse catheter placement in bypass. Patient started on alteplase and heparin in the cath lab now s/p angioplasty with plans for continued thrombolysis. Alteplase and heparin were restarted post procedure today -Alteplase is infusing at 1 mg/hr, heparin running at 1100 units/hr -hg= 12.7, plt= 153, fibrinogen= 213   Goal of Therapy:  Heparin level 0.2-0.5 units/mL Monitor platelets by anticoagulation protocol: Yes   Plan:  Continue heparin at 1100 units/hr Heparin level in 6 hours and daily wth CBC daily  Call MD if fibrinogen < 150  Hildred Laser, PharmD Clinical Pharmacist **Pharmacist phone directory can now be found on [email or web address] ([email or web address]).  Listed under [place].

## 2018-07-12 NOTE — Op Note (Signed)
Patient name: BOE DEANS MRN: [id number] DOB: 06-06-1945 Sex: male  07/11/2018 - 07/12/2018 Pre-operative Diagnosis: Occluded right common femoral to posterior tibial prosthetic bypass Post-operative diagnosis:  Same Surgeon:  Marty Heck, MD Procedure Performed: 1.  Thrombolytics catheter check of the right lower extremity with catheter exchange in the right common femoral to posterior tibial prosthetic bypass 2.  Angioplasty of the proximal bypass anastomosis (6 mm x 60 mm Mustang) 3.  Angioplasty of the distal bypass anastomosis (3 mm x 40 mm Sterling) 4.  Angioplasty of the right posterior tibial artery (3 mm x 150 mm Sterling) 5.  63 minutes of monitored conscious sedation time  Indications: Patient is a 73 year old male who presented with a thrombosed right common femoral to posterior tibial prosthetic bypass.  He underwent thrombolytics catheter placement with UniFuse catheter placement in his bypass yesterday and thrombolysis overnight.  He presents today for thrombolytics catheter check after risks and benefits were discussed.  Findings: Initial right lower extremity arteriogram through sheath injection after the Unifuse catheter was removed showed no filling of the right lower extremity bypass.  At that time I used a V 18 to cross the distal anastomosis of the graft to the posterior tibial artery.  I suspected the patient had both an inflow and outflow issue given imaging from yesterday.  I initially ballooned the distal anastomosis at the posterior tibial artery with a 3 mm x 40 mm Sterling.  I then ballooned the proximal anastomosis of the graft with a 6 mm x 60 mm Mustang.  Hand-injection after angioplasty of the proximal and distal anastomosis now showed contrast filling the bypass with flow inline down to the posterior tibial.  There was still some remnant thrombus in the distal extent of the graft near the distal anastomosis and there was very poor runoff into the  foot.  I then went down and selected a long 3 mm x 150 mm Sterling and performed balloon angioplasty of the entire right posterior tibial which was the runoff of the bypass.  Final injection again showed a patent graft now with flow down to the posterior tibial.  Again we observed thrombus in the distal bypass as well as very poor runoff into the foot.  Given my concern that his poor runoff into the foot was likely to reocclude the graft, we elected to put a lytics catheter back down further into the distal graft and posterior tibial artery and will perform thrombolytics again tonight to try and improve his outflow.   Procedure:  The patient was identified in the holding area and taken to room 8.  The patient was then placed supine on the table and prepped and draped in the usual sterile fashion.  A time out was called.  Initially removed the inner cannula from the UniFuse catheter that was placed down the right lower extremity bypass.  I then placed a V 18 wire down into the bypass through the Unifuse and the catheter was removed.  I performed some sequential hand injections through the sheath in the left groin to evaluate the right lower extremity bypass and it showed no filling of the bypass with filling of the common femoral and profunda.  Again I thought that the patient had issues with both an inflow lesion and an outflow lesion at both proximal and distal anastomosis of the graft.  I initially used a CXI catheter with my V 18 wire and was able to successfully cross the distal anastomosis of the  graft with some difficulty into the native posterior tibial and got my wire into the foot.  At that point time patient was given additional heparin and ACT was rechecked to maintain them greater than 250.  I injected through my CXI catheter with hand injection to confirm I was in the true lumen.  I then performed balloon angioplasty of the distal anastomosis of the graft onto the PT artery with a 3 mm x 40 mm  Sterling to nominal pressure for 2 minutes.  I then selected a 6 mm x 60 mm Mustang and performed angioplasty of the proximal anastomosis of the graft.  Another injection through the sheath showed now a patent graft with flow into the posterior tibial.  My concern was there was still remnant thrombus in the distal extent of the graft near the distal anastomosis and very poor runoff in the foot.  We gave additional nitroglycerin and then I elected to perform balloon angioplasty of the entire posterior tibial artery using a 3 mm x 150 mm Sterling.  Final injection showed again now inline flow through the graft and into the native posterior tibial at the ankle.  Again I was concerned about some remnant thrombus that may re-occlude the bypass as well as very poor runoff in the foot.  As a result I placed a new UniFuse catheter across the distal anastomosis of the native posterior tibial under fluoroscopic guidance.  We placed inner cannula.  Patient will be taken back to the ICU tonight for one more night of thrombolysis to try and improve his outflow and then will be brought back tomorrow for one additional evaluation.  The left groin sheath and UniFuse catheter were then resecured and the patient was put back up to heparin through the sheath and TPA through the UniFuse catheter.  Condition: Stable     Marty Heck, MD [facility] [place] Office: [phone] Pager: [place]

## 2018-07-12 NOTE — Progress Notes (Signed)
[place] for Heparin  Indication: Occluded bypass, currently undergoing lysis with alteplase  Allergies  Allergen Reactions  . Aspirin Other (See Comments)    Has to take the coated aspirin    Patient Measurements: Height: 5\' 4"  (162.6 cm) Weight: 151 lb 14.4 oz (68.9 kg) IBW/kg (Calculated) : 59.2 Heparin Dosing Weight: 68.9 kg  Vital Signs: Temp: 100.1 F (37.8 C) (02/13 1955) Temp Source: Oral (02/13 1955) BP: 118/68 (02/13 2000) Pulse Rate: 85 (02/13 2000)  Labs: Recent Labs    07/11/18 0905  07/11/18 2105 07/11/18 2108 07/12/18 0300 07/12/18 0308 07/12/18 0810 07/12/18 1815 07/12/18 1900  HGB  --    < > 12.6*  --  12.7*  --   --  11.9*  --   HCT  --    < > 40.1  --  38.9*  --   --  36.5*  --   PLT  --    < > 153  --  153  --   --  PLATELET CLUMPS NOTED ON SMEAR, UNABLE TO ESTIMATE  --   HEPARINUNFRC  --    < >  --  0.15*  --  <0.10*  --   --  0.46  CREATININE 0.90  --   --   --   --   --  0.70 0.88  --    < > = values in this interval not displayed.    Estimated Creatinine Clearance: 64.5 mL/min (by C-G formula based on SCr of 0.88 mg/dL).   Medical History: Past Medical History:  Diagnosis Date  . Arthritis   . Asthma   . Carpal tunnel syndrome   . Chest pain, unspecified   . Chronic kidney disease    hx of kidney stones  . Coronary artery disease   . Coronary atherosclerosis of artery bypass graft   . GERD (gastroesophageal reflux disease)   . History of kidney stones   . HOH (hard of hearing)   . Hypertension   . 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 Other and unspecified hyperlipidemia   . Personal history of unspecified circulatory disease   . Shortness of breath   . Stroke ([place])   . TIA (transient ischemic attack)     Medications:  Scheduled:  . amLODipine  5 mg Oral Daily  . atorvastatin  40 mg Oral q1800  . mometasone-formoterol  2 puff Inhalation  BID  . pantoprazole  40 mg Oral Daily  . potassium chloride  40 mEq Oral TID  . sodium chloride flush  3 mL Intravenous Q12H   Infusions:  . sodium chloride    . sodium chloride 125 mL/hr at 07/12/18 2000  . alteplase (LIMB ISCHEMIA) 10 mg in normal saline (0.02 mg/mL) infusion 1 mg/hr (07/12/18 2000)  . heparin 1,100 Units/hr (07/12/18 2000)    Assessment: 73 yo M with thrombosed right common femoral to posterior tibial prosthetic bypass. S/p unifuse catheter placement in bypass. Patient started on alteplase and heparin in the cath lab now s/p angioplasty with plans for continued thrombolysis. Alteplase and heparin were restarted post procedure today  -Alteplase is infusing at 1 mg/hr, heparin running at 1100 units/hr -hg= 12.7, plt= 153, fibrinogen= 213  Heparin level this evening is within goal range, fibrinogen 392. No overt bleeding or complications noted.    Goal of Therapy:  Heparin level 0.2-0.5 units/mL Monitor platelets by anticoagulation protocol: Yes   Plan:  Continue heparin at 1100 units/hr Heparin level in 6 hours and daily wth CBC daily  Call MD if fibrinogen < 150  [place] Crane, Roylene Reason, [place] Pharmacist Phone [phone]  07/12/2018 9:11 PM

## 2018-07-13 ENCOUNTER — Encounter (HOSPITAL_COMMUNITY): Payer: Self-pay

## 2018-07-13 ENCOUNTER — Encounter (HOSPITAL_COMMUNITY)
Admission: AD | Disposition: A | Discharge status: Home / Self Care | Payer: Self-pay | Source: Home / Self Care | Attending: Vascular Surgery

## 2018-07-13 ENCOUNTER — Telehealth: Payer: Self-pay | Admitting: *Deleted

## 2018-07-13 HISTORY — PX: PERIPHERAL VASCULAR THROMBECTOMY: CATH118306

## 2018-07-13 HISTORY — PX: PERIPHERAL VASCULAR BALLOON ANGIOPLASTY: CATH118281

## 2018-07-13 LAB — FIBRINOGEN
FIBRINOGEN: 302 mg/dL (ref 210–475)
Fibrinogen: 378 mg/dL (ref 210–475)
Fibrinogen: 391 mg/dL (ref 210–475)

## 2018-07-13 LAB — POCT ACTIVATED CLOTTING TIME
ACTIVATED CLOTTING TIME: 257 s
Activated Clotting Time: 153 seconds

## 2018-07-13 LAB — CBC
HEMATOCRIT: 37.3 % — AB (ref 39.0–52.0)
Hemoglobin: 12.3 g/dL — ABNORMAL LOW (ref 13.0–17.0)
MCH: 31.1 pg (ref 26.0–34.0)
MCHC: 33 g/dL (ref 30.0–36.0)
MCV: 94.2 fL (ref 80.0–100.0)
Platelets: 100 10*3/uL — ABNORMAL LOW (ref 150–400)
RBC: 3.96 MIL/uL — ABNORMAL LOW (ref 4.22–5.81)
RDW: 13.2 % (ref 11.5–15.5)
WBC: 8.8 10*3/uL (ref 4.0–10.5)
nRBC: 0 % (ref 0.0–0.2)

## 2018-07-13 LAB — BASIC METABOLIC PANEL
ANION GAP: 9 (ref 5–15)
BUN: 7 mg/dL — ABNORMAL LOW (ref 8–23)
CO2: 16 mmol/L — ABNORMAL LOW (ref 22–32)
Calcium: 7.9 mg/dL — ABNORMAL LOW (ref 8.9–10.3)
Chloride: 111 mmol/L (ref 98–111)
Creatinine, Ser: 0.9 mg/dL (ref 0.61–1.24)
GFR calc Af Amer: >60 mL/min (ref >60)
GFR calc non Af Amer: >60 mL/min (ref >60)
GLUCOSE: 91 mg/dL (ref 70–99)
Potassium: 3.6 mmol/L (ref 3.5–5.1)
Sodium: 136 mmol/L (ref 135–145)

## 2018-07-13 LAB — HEPARIN LEVEL (UNFRACTIONATED): Heparin Unfractionated: 0.2 IU/mL — ABNORMAL LOW (ref 0.30–0.70)

## 2018-07-13 SURGERY — PERIPHERAL VASCULAR THROMBECTOMY
Anesthesia: LOCAL | Laterality: Right

## 2018-07-13 MED ORDER — SODIUM CHLORIDE 0.9 % IV SOLN
INTRAVENOUS | Status: AC
  Administered 2018-07-13: 17:00:00 via INTRAVENOUS

## 2018-07-13 MED ORDER — SODIUM CHLORIDE 0.9 % IV SOLN: 250.0000 mL | INTRAVENOUS | Status: DC | PRN

## 2018-07-13 MED ORDER — LIDOCAINE HCL (PF) 1 % IJ SOLN
INTRAMUSCULAR | Status: AC
  Filled 2018-07-13: qty 30

## 2018-07-13 MED ORDER — FENTANYL CITRATE (PF) 100 MCG/2ML IJ SOLN
INTRAMUSCULAR | Status: DC | PRN
  Administered 2018-07-13: 50 ug via INTRAVENOUS

## 2018-07-13 MED ORDER — ONDANSETRON HCL 4 MG/2ML IJ SOLN: 4.0000 mg | Freq: Four times a day (QID) | INTRAMUSCULAR | Status: DC | PRN

## 2018-07-13 MED ORDER — FENTANYL CITRATE (PF) 100 MCG/2ML IJ SOLN
INTRAMUSCULAR | Status: AC
  Filled 2018-07-13: qty 2

## 2018-07-13 MED ORDER — HEPARIN (PORCINE) IN NACL 1000-0.9 UT/500ML-% IV SOLN
INTRAVENOUS | Status: AC
  Filled 2018-07-13: qty 500

## 2018-07-13 MED ORDER — ACETAMINOPHEN 325 MG PO TABS: 650.0000 mg | ORAL_TABLET | ORAL | Status: DC | PRN

## 2018-07-13 MED ORDER — HEPARIN SODIUM (PORCINE) 5000 UNIT/ML IJ SOLN
5000.0000 [IU] | Freq: Three times a day (TID) | INTRAMUSCULAR | Status: DC
  Administered 2018-07-13 – 2018-07-17 (×11): 5000 [IU] via SUBCUTANEOUS
  Filled 2018-07-13 (×11): qty 1

## 2018-07-13 MED ORDER — HEPARIN SODIUM (PORCINE) 1000 UNIT/ML IJ SOLN
INTRAMUSCULAR | Status: AC
  Filled 2018-07-13: qty 1

## 2018-07-13 MED ORDER — IODIXANOL 320 MG/ML IV SOLN
INTRAVENOUS | Status: DC | PRN
  Administered 2018-07-13: 40 mL via INTRA_ARTERIAL

## 2018-07-13 MED ORDER — HEPARIN SODIUM (PORCINE) 1000 UNIT/ML IJ SOLN
INTRAMUSCULAR | Status: DC | PRN
  Administered 2018-07-13: 7000 [IU] via INTRAVENOUS

## 2018-07-13 MED ORDER — LABETALOL HCL 5 MG/ML IV SOLN: 10.0000 mg | INTRAVENOUS | Status: DC | PRN

## 2018-07-13 MED ORDER — SODIUM CHLORIDE 0.9% FLUSH
3.0000 mL | Freq: Two times a day (BID) | INTRAVENOUS | Status: DC
  Administered 2018-07-13: 3 mL via INTRAVENOUS

## 2018-07-13 MED ORDER — HYDRALAZINE HCL 20 MG/ML IJ SOLN: 5.0000 mg | INTRAMUSCULAR | Status: DC | PRN

## 2018-07-13 MED ORDER — HEPARIN (PORCINE) IN NACL 1000-0.9 UT/500ML-% IV SOLN
INTRAVENOUS | Status: DC | PRN
  Administered 2018-07-13: 500 mL

## 2018-07-13 MED ORDER — SODIUM CHLORIDE 0.9% FLUSH: 3.0000 mL | INTRAVENOUS | Status: DC | PRN

## 2018-07-13 MED FILL — Nitroglycerin IV Soln 100 MCG/ML in D5W: INTRA_ARTERIAL | Qty: 10 | Status: AC

## 2018-07-13 SURGICAL SUPPLY — 11 items
BAG SNAP BAND KOVER 36X36 (MISCELLANEOUS) ×2 IMPLANT
BALLN STERLING OTW 3X150X150 (BALLOONS) ×2
BALLOON STERLING OTW 3X150X150 (BALLOONS) ×1 IMPLANT
CATH TEMPO AQUA 5F 100CM (CATHETERS) ×2 IMPLANT
COVER DOME SNAP 22 D (MISCELLANEOUS) ×2 IMPLANT
KIT ENCORE 26 ADVANTAGE (KITS) ×2 IMPLANT
SHEATH PINNACLE 7F 10CM (SHEATH) ×2 IMPLANT
STOPCOCK MORSE 400PSI 3WAY (MISCELLANEOUS) ×2 IMPLANT
TRAY PV CATH (CUSTOM PROCEDURE TRAY) ×2 IMPLANT
WIRE BENTSON .035X145CM (WIRE) ×2 IMPLANT
WIRE G V18X300CM (WIRE) ×2 IMPLANT

## 2018-07-13 NOTE — Progress Notes (Signed)
[facility] [place]  Subjective  - No complaints.  Thinks right foot may feel a bit better.   Objective (!) 152/65 84 99.5 F (37.5 C) (Oral) (!) 25 95%  Intake/Output Summary (Last 24 hours) at 07/13/2018 1140 Last data filed at 07/13/2018 0900 Gross per 24 hour  Intake 4338.21 ml  Output 3600 ml  Net 738.21 ml    Left groin sheath c/d/i Right PT signal Dependent rubor in right foot improved  Laboratory Lab Results: Recent Labs    07/12/18 1815 07/13/18 0440  WBC 6.6 8.8  HGB 11.9* 12.3*  HCT 36.5* 37.3*  PLT PLATELET CLUMPS NOTED ON SMEAR, UNABLE TO ESTIMATE 100*   BMET Recent Labs    07/12/18 1815 07/13/18 0440  NA 134* 136  K 3.3* 3.6  CL 108 111  CO2 19* 16*  GLUCOSE 99 91  BUN 8 7*  CREATININE 0.88 0.90  CALCIUM 7.7* 7.9*    COAG Lab Results  Component Value Date   INR 1.01 02/05/2018   INR 0.97 06/27/2017   INR 1.0 06/09/2017   No results found for: PTT  Assessment/Planning:  Plan for return to [facility] lab today with Dr. Oneida Alar for thrombolytics catheter check of right lower extremity.  Hgb stable at 12.3 and fibrinogen 391.  Please keep NPO.  Marty Heck 07/13/2018 11:40 AM --

## 2018-07-13 NOTE — Progress Notes (Signed)
ACT 153. Sheath pulled per prototcol, pressure held >20 mins. Level 0, gauze transparent dressing in place. CDI. Vitals remain stable, no s/sx bleed/hematoma.

## 2018-07-13 NOTE — Op Note (Signed)
Procedure: Right lower extremity arteriogram, angioplasty right posterior tibial artery  Preoperative diagnosis: Right leg ischemia  Postoperative diagnosis: Same  Anesthesia: Local with IV sedation  Operative findings: #1 reocclusion of right posterior tibial bypass graft no successful angioplasty no outflow vessel right foot  Operative details: After obtaining form consent, patient taken the PV lab.  The patient was placed in supine position on operating table.  A pre-existing sidehole infusion catheter was removed over a V 18 wire.  Contrast angiogram was performed through a pre-existing sheath which is up and over the aortic bifurcation.  Most initial contrast flow went into the profunda.  There was some flow into the bypass graft but it was very sluggish.  I then took a 5 [place] straight sheath and advanced it over the V 18 wire into the body of the graft.  Again the contrast opacified the graft but there was no outflow.  At this point I attempted to place a 3 x 150 Sterling balloon all the way down into the posterior tibial artery and angioplastied this in 2 separate overlapping inflations to try to gain some outflow.  There was still no outflow for the bypass graft at this point.  At this time I decided to abandon any further attempts as the bypass is reoccluded there is no outflow and the patient has been on lytic therapy for 2 days with no significant improvement.  At this point the sheath was pulled down and out over guidewire and exchanged for 7 French short sheath in the left groin.  The patient had been given 7000 units of heparin prior to angioplasty of the posterior tibial artery.  Operative management: Most likely the patient will need a right above or below knee amputation in the near future.  No further attempts to salvage bypass graft.  [name], MD [facility] [place] Office: [phone] Pager: [phone]

## 2018-07-13 NOTE — Progress Notes (Signed)
ANTICOAGULATION CONSULT NOTE   Pharmacy Consult for Heparin  Indication: Occluded bypass, currently undergoing lysis with alteplase  Patient Measurements: Height: 5\' 4"  (162.6 cm) Weight: 151 lb 14.4 oz (68.9 kg) IBW/kg (Calculated) : 59.2 Heparin Dosing Weight: 68.9 kg  Vital Signs: Temp: 98.8 F (37.1 C) (02/14 0000) Temp Source: Oral (02/14 0000) BP: 153/84 (02/14 0500) Pulse Rate: 109 (02/14 0500)  Labs: Recent Labs    07/12/18 0300 07/12/18 0308 07/12/18 0810 07/12/18 1815 07/12/18 1900 07/13/18 0440  HGB 12.7*  --   --  11.9*  --  12.3*  HCT 38.9*  --   --  36.5*  --  37.3*  PLT 153  --   --  PLATELET CLUMPS NOTED ON SMEAR, UNABLE TO ESTIMATE  --  PENDING  HEPARINUNFRC  --  <0.10*  --   --  0.46 0.20*  CREATININE  --   --  0.70 0.88  --  0.90    Estimated Creatinine Clearance: 63 mL/min (by C-G formula based on SCr of 0.9 mg/dL).  Assessment: 73 yo M with thrombosed right common femoral to posterior tibial prosthetic bypass. S/p unifuse catheter placement in bypass. Patient started on alteplase and heparin in the cath lab now s/p angioplasty with plans for continued thrombolysis. Alteplase and heparin were restarted post procedure today  Alteplase is infusing at 1 mg/hr, heparin running at 1100 units/hr   Heparin level this evening is within goal range but at the lower end of goal range, fibrinogen 391. No overt bleeding or complications noted.     Goal of Therapy:  Heparin level 0.2-0.5 units/mL Monitor platelets by anticoagulation protocol: Yes   Plan:  Increase heparin to 1150 units/hr Daily heparin level and CBC Call MD if fibrinogen < 150  Salome Arnt, PharmD, BCPS Please see AMION for all pharmacy numbers 07/13/2018 6:02 AM

## 2018-07-13 NOTE — Progress Notes (Signed)
Transported to cath lab 

## 2018-07-13 NOTE — Progress Notes (Signed)
Previous order for no IV sticks for labs. Per Dr. Oneida Alar (vascular), ok for pt to have lab draws via IV.

## 2018-07-13 NOTE — Telephone Encounter (Signed)
Documentation of possible surgery

## 2018-07-13 NOTE — Interval H&P Note (Signed)
History and Physical Interval Note:  07/13/2018 3:24 PM  Devin [name]  has presented today for surgery, with the diagnosis of clot  The various methods of treatment have been discussed with the patient and family. After consideration of risks, benefits and other options for treatment, the patient has consented to  Procedure(s): PERIPHERAL VASCULAR THROMBECTOMY (Right) as a surgical intervention .  The patient's history has been reviewed, patient examined, no change in status, stable for surgery.  I have reviewed the patient's chart and labs.  Questions were answered to the patient's satisfaction.     [name]

## 2018-07-13 NOTE — H&P (View-Only) (Signed)
[facility]   Subjective  - No complaints.  Thinks right foot may feel a bit better.   Objective (!) 152/65 84 99.5 F (37.5 C) (Oral) (!) 25 95%  Intake/Output Summary (Last 24 hours) at 07/13/2018 1140 Last data filed at 07/13/2018 0900 Gross per 24 hour  Intake 4338.21 ml  Output 3600 ml  Net 738.21 ml    Left groin sheath c/d/i Right PT signal Dependent rubor in right foot improved  Laboratory Lab Results: Recent Labs    07/12/18 1815 07/13/18 0440  WBC 6.6 8.8  HGB 11.9* 12.3*  HCT 36.5* 37.3*  PLT PLATELET CLUMPS NOTED ON SMEAR, UNABLE TO ESTIMATE 100*   BMET Recent Labs    07/12/18 1815 07/13/18 0440  NA 134* 136  K 3.3* 3.6  CL 108 111  CO2 19* 16*  GLUCOSE 99 91  BUN 8 7*  CREATININE 0.88 0.90  CALCIUM 7.7* 7.9*    COAG Lab Results  Component Value Date   INR 1.01 02/05/2018   INR 0.97 06/27/2017   INR 1.0 06/09/2017   No results found for: PTT  Assessment/Planning:  Plan for return to [facility] lab today with Dr. Oneida Alar for thrombolytics catheter check of right lower extremity.  Hgb stable at 12.3 and fibrinogen 391.  Please keep NPO.  Marty Heck 07/13/2018 11:40 AM --

## 2018-07-13 NOTE — Progress Notes (Signed)
Pt returned from cath lab. Dressing in place over fem sheath, small amount bloody drainage, no noted hematoma. ACT to be re drawn at 1830. Vascular exam unchanged, doppler pulses.Pt alert, oriented, tol sips of clears. BKA planned for Monday am.

## 2018-07-13 NOTE — Telephone Encounter (Signed)
-----   Message from Elam Dutch, MD sent at 07/13/2018  4:14 PM EST ----- Procedure: Right lower extremity arteriogram, angioplasty right posterior tibial artery   Most likely the patient will need a right above or below knee amputation in the near future.  No further attempts to salvage bypass graft.  [name], MD [facility] [place] Office: [phone] Pager: [phone]

## 2018-07-14 ENCOUNTER — Inpatient Hospital Stay (HOSPITAL_COMMUNITY): Payer: Medicare Other

## 2018-07-14 LAB — CBC
HCT: 34.6 % — ABNORMAL LOW (ref 39.0–52.0)
Hemoglobin: 11.3 g/dL — ABNORMAL LOW (ref 13.0–17.0)
MCH: 29.9 pg (ref 26.0–34.0)
MCHC: 32.7 g/dL (ref 30.0–36.0)
MCV: 91.5 fL (ref 80.0–100.0)
Platelets: 88 10*3/uL — ABNORMAL LOW (ref 150–400)
RBC: 3.78 MIL/uL — ABNORMAL LOW (ref 4.22–5.81)
RDW: 12.7 % (ref 11.5–15.5)
WBC: 11.5 10*3/uL — ABNORMAL HIGH (ref 4.0–10.5)
nRBC: 0 % (ref 0.0–0.2)

## 2018-07-14 LAB — BASIC METABOLIC PANEL
Anion gap: 10 (ref 5–15)
BUN: 8 mg/dL (ref 8–23)
CO2: 16 mmol/L — ABNORMAL LOW (ref 22–32)
Calcium: 7.9 mg/dL — ABNORMAL LOW (ref 8.9–10.3)
Chloride: 108 mmol/L (ref 98–111)
Creatinine, Ser: 0.82 mg/dL (ref 0.61–1.24)
GFR calc Af Amer: >60 mL/min (ref >60)
GFR calc non Af Amer: >60 mL/min (ref >60)
Glucose, Bld: 91 mg/dL (ref 70–99)
Potassium: 3.9 mmol/L (ref 3.5–5.1)
SODIUM: 134 mmol/L — AB (ref 135–145)

## 2018-07-14 LAB — GLUCOSE, CAPILLARY: Glucose-Capillary: 101 mg/dL — ABNORMAL HIGH (ref 70–99)

## 2018-07-14 LAB — MAGNESIUM: MAGNESIUM: 1.7 mg/dL (ref 1.7–2.4)

## 2018-07-14 MED ORDER — LOPERAMIDE HCL 1 MG/7.5ML PO SUSP
1.0000 mg | ORAL | Status: DC | PRN
  Filled 2018-07-14: qty 7.5

## 2018-07-14 NOTE — Progress Notes (Signed)
Patient transferred to 4E06 via w/c. Patient A/O x 4. Wife present at bedside and took all personal belongings with them. Report given to receiving nurse. Patient in stable condition.

## 2018-07-14 NOTE — Progress Notes (Addendum)
[facility] [place]  Subjective  - feels ok no foot pain   Objective (!) 105/91 100 100 F (37.8 C) (Oral) (!) 22 97%  Intake/Output Summary (Last 24 hours) at 07/14/2018 1123 Last data filed at 07/14/2018 1000 Gross per 24 hour  Intake 3329.59 ml  Output 1950 ml  Net 1379.59 ml   Right foot cool Left groin no hematoma, left foot warm  Assessment/Planning: S/p occlusion right fem PT currently pain free Will transfer to 4E if pain free tomorrow will d/c home Will probably need amp eventually but not pressed as long as no infection or pain  [name] 07/14/2018 11:23 AM --  Laboratory Lab Results: Recent Labs    07/13/18 0440 07/14/18 0332  WBC 8.8 11.5*  HGB 12.3* 11.3*  HCT 37.3* 34.6*  PLT 100* 88*   BMET Recent Labs    07/13/18 0440 07/14/18 0332  NA 136 134*  K 3.6 3.9  CL 111 108  CO2 16* 16*  GLUCOSE 91 91  BUN 7* 8  CREATININE 0.90 0.82  CALCIUM 7.9* 7.9*    COAG Lab Results  Component Value Date   INR 1.01 02/05/2018   INR 0.97 06/27/2017   INR 1.0 06/09/2017   No results found for: PTT

## 2018-07-14 NOTE — Plan of Care (Signed)
Patient not interested in learning about how to improve his health. He wants to continue smoking and eating a high fat, high salt diet.

## 2018-07-15 LAB — CBC
HCT: 34.5 % — ABNORMAL LOW (ref 39.0–52.0)
Hemoglobin: 11.4 g/dL — ABNORMAL LOW (ref 13.0–17.0)
MCH: 30.4 pg (ref 26.0–34.0)
MCHC: 33 g/dL (ref 30.0–36.0)
MCV: 92 fL (ref 80.0–100.0)
Platelets: 96 10*3/uL — ABNORMAL LOW (ref 150–400)
RBC: 3.75 MIL/uL — ABNORMAL LOW (ref 4.22–5.81)
RDW: 13 % (ref 11.5–15.5)
WBC: 10.1 10*3/uL (ref 4.0–10.5)
nRBC: 0 % (ref 0.0–0.2)

## 2018-07-15 MED ORDER — CEFUROXIME SODIUM 1.5 G IV SOLR
1.5000 g | Freq: Three times a day (TID) | INTRAVENOUS | Status: DC
  Filled 2018-07-15 (×2): qty 1.5

## 2018-07-15 MED ORDER — SODIUM CHLORIDE 0.9 % IV SOLN
1.5000 g | Freq: Three times a day (TID) | INTRAVENOUS | Status: DC
  Administered 2018-07-15 – 2018-07-17 (×7): 1.5 g via INTRAVENOUS
  Filled 2018-07-15 (×8): qty 1.5

## 2018-07-15 NOTE — Plan of Care (Signed)
  Problem: Education: Goal: Knowledge of prescribed regimen will improve Outcome: Progressing   

## 2018-07-15 NOTE — Progress Notes (Addendum)
  Progress Note    07/15/2018 7:29 AM 2 Days Post-Op  Subjective:  Continues to not have pain in his right foot; wants to go home  Tm 99.6 HR 80's-110's  993'T-701'X systolic 79% [id number]  Vitals:   07/14/18 2014 07/15/18 0616  BP: 120/66 130/68  Pulse: 95 92  Resp: 17 (!) 23  Temp: 99.5 F (37.5 C) 99.6 F (37.6 C)  SpO2: 94% 94%    Physical Exam: General:  No distress Lungs:  Non labored Incisions:  Left groin soft without hematoma Extremities:  Right foot warm with some peeling of his toes; motor sensation in tact; left foot warm and well perfused; motor sensation in tact.    CBC    Component Value Date/Time   WBC 10.1 07/15/2018 0332   RBC 3.75 (L) 07/15/2018 0332   HGB 11.4 (L) 07/15/2018 0332   HGB 13.7 06/09/2017 0935   HCT 34.5 (L) 07/15/2018 0332   HCT 39.9 06/09/2017 0935   PLT 96 (L) 07/15/2018 0332   PLT 173 06/09/2017 0935   MCV 92.0 07/15/2018 0332   MCV 93 06/09/2017 0935   MCH 30.4 07/15/2018 0332   MCHC 33.0 07/15/2018 0332   RDW 13.0 07/15/2018 0332   RDW 14.2 06/09/2017 0935    BMET    Component Value Date/Time   NA 134 (L) 07/14/2018 0332   NA 145 (H) 06/09/2017 0935   K 3.9 07/14/2018 0332   CL 108 07/14/2018 0332   CO2 16 (L) 07/14/2018 0332   GLUCOSE 91 07/14/2018 0332   BUN 8 07/14/2018 0332   BUN 8 06/09/2017 0935   CREATININE 0.82 07/14/2018 0332   CREATININE 0.93 07/28/2015 1034   CALCIUM 7.9 (L) 07/14/2018 0332   GFRNONAA >60 07/14/2018 0332   GFRAA >60 07/14/2018 0332    INR    Component Value Date/Time   INR 1.01 02/05/2018 0829     Intake/Output Summary (Last 24 hours) at 07/15/2018 0729 Last data filed at 07/14/2018 1600 Gross per 24 hour  Intake 1003.15 ml  Output -  Net 1003.15 ml     Assessment:  73 y.o. male is s/p:  Right lower extremity arteriogram, angioplasty right posterior tibial artery now occluded 2 Days Post-Op  Plan: -pt continues to be pain free Low grade fever over night; wbc okay.   cxr reveals possible aspiration pna and on 2LO2NC.  Will need to keep and treat with IV abx.    Leontine Locket, PA-C [facility] [phone] 07/15/2018 7:29 AM  Agree with above.  Has poor pulmonary function baseline.  Will give 48 hr IV antibiotic for presumed pneumonia.  Resp culture hopefully home on Levaquin in 48 hr if resp status improves.  Right foot currently viable   He has outpt follow up with me this week  [name], MD [facility] [place] Office: [phone] Pager: [phone]

## 2018-07-16 ENCOUNTER — Encounter (HOSPITAL_COMMUNITY): Payer: Self-pay | Admitting: Vascular Surgery

## 2018-07-16 LAB — CBC
HCT: 32.5 % — ABNORMAL LOW (ref 39.0–52.0)
Hemoglobin: 10.8 g/dL — ABNORMAL LOW (ref 13.0–17.0)
MCH: 30.9 pg (ref 26.0–34.0)
MCHC: 33.2 g/dL (ref 30.0–36.0)
MCV: 93.1 fL (ref 80.0–100.0)
PLATELETS: 111 10*3/uL — AB (ref 150–400)
RBC: 3.49 MIL/uL — ABNORMAL LOW (ref 4.22–5.81)
RDW: 13.3 % (ref 11.5–15.5)
WBC: 7.6 10*3/uL (ref 4.0–10.5)
nRBC: 0 % (ref 0.0–0.2)

## 2018-07-16 MED FILL — Lidocaine HCl Local Preservative Free (PF) Inj 1%: INTRAMUSCULAR | Qty: 30 | Status: AC

## 2018-07-16 NOTE — Progress Notes (Signed)
[facility] [place]  Subjective  - feels ok no foot pain breathing better   Objective 117/65 76 98.2 F (36.8 C) (Oral) (!) 22 90%  Intake/Output Summary (Last 24 hours) at 07/16/2018 1430 Last data filed at 07/16/2018 1214 Gross per 24 hour  Intake 966.5 ml  Output -  Net 966.5 ml   Right foot cool but viable Left groin no hematoma  Assessment/Planning: Viable right foot no pain or wound despite occluded bypass Pneumonia on cefuroxime will transition to Levaquin in am and dc home  [name] 07/16/2018 2:30 PM --  Laboratory Lab Results: Recent Labs    07/15/18 0332 07/16/18 0324  WBC 10.1 7.6  HGB 11.4* 10.8*  HCT 34.5* 32.5*  PLT 96* 111*   BMET Recent Labs    07/14/18 0332  NA 134*  K 3.9  CL 108  CO2 16*  GLUCOSE 91  BUN 8  CREATININE 0.82  CALCIUM 7.9*    COAG Lab Results  Component Value Date   INR 1.01 02/05/2018   INR 0.97 06/27/2017   INR 1.0 06/09/2017   No results found for: PTT

## 2018-07-16 NOTE — Care Management Important Message (Signed)
Important Message  Patient Details  Name: LEVERT HESLOP MRN: [id number] Date of Birth: 08/23/1945   Medicare Important Message Given:  Yes    Barb Merino Jobe Mutch 07/16/2018, 4:23 PM

## 2018-07-17 ENCOUNTER — Encounter (HOSPITAL_COMMUNITY): Payer: Self-pay

## 2018-07-17 LAB — CBC
HCT: 31.5 % — ABNORMAL LOW (ref 39.0–52.0)
Hemoglobin: 10.1 g/dL — ABNORMAL LOW (ref 13.0–17.0)
MCH: 30.1 pg (ref 26.0–34.0)
MCHC: 32.1 g/dL (ref 30.0–36.0)
MCV: 93.8 fL (ref 80.0–100.0)
Platelets: 133 10*3/uL — ABNORMAL LOW (ref 150–400)
RBC: 3.36 MIL/uL — ABNORMAL LOW (ref 4.22–5.81)
RDW: 13.2 % (ref 11.5–15.5)
WBC: 6.6 10*3/uL (ref 4.0–10.5)
nRBC: 0 % (ref 0.0–0.2)

## 2018-07-17 MED ORDER — LEVOFLOXACIN 500 MG PO TABS: 500.0000 mg | ORAL_TABLET | Freq: Every day | ORAL | 0 refills | Status: AC

## 2018-07-17 NOTE — Progress Notes (Addendum)
Patient in bed watching T.V. and had 5 bts S.V.T. with no complaints.Then back to S.R. 92.

## 2018-07-17 NOTE — Care Management Note (Signed)
Case Management Note Marvetta Gibbons RN, BSN Transitions of Care Unit 4E- RN Case Manager [phone]  Patient Details  Name: Devin [name] MRN: [id number] Date of Birth: 1945/06/23  Subjective/Objective:   Pt admitted with critical lower limb ischemia s/p Right lower extremity arteriogram, angioplasty right posterior tibial artery             Action/Plan: PTA pt lived at home with wife, plan to return home, Pt has both cane and walker at home, no other DME needs noted. No CM needs noted for transition home.   Expected Discharge Date:  07/17/18               Expected Discharge Plan:  Home/Self Care  In-House Referral:  NA  Discharge planning Services  CM Consult  Post Acute Care Choice:  NA Choice offered to:  NA  DME Arranged:    DME Agency:     HH Arranged:    HH Agency:     Status of Service:  Completed, signed off  If discussed at [place] of Stay Meetings, dates discussed:    Discharge Disposition: home/self care   Additional Comments:  Dawayne Patricia, RN 07/17/2018, 9:50 AM

## 2018-07-17 NOTE — Progress Notes (Signed)
  Progress Note    07/17/2018 7:29 AM 4 Days Post-Op  Subjective:  Ready to go home; denies pain in foot  Afebrile HR 40'C-14'G  818'H systolic 63% RA  Vitals:   07/16/18 2008 07/17/18 0427  BP: 124/64 126/67  Pulse: 84 72  Resp: 20 18  Temp: 98.1 F (36.7 C) 98.3 F (36.8 C)  SpO2: 94% 95%    Physical Exam: General:  No distress Lungs:  Non labored   CBC    Component Value Date/Time   WBC 6.6 07/17/2018 0226   RBC 3.36 (L) 07/17/2018 0226   HGB 10.1 (L) 07/17/2018 0226   HGB 13.7 06/09/2017 0935   HCT 31.5 (L) 07/17/2018 0226   HCT 39.9 06/09/2017 0935   PLT 133 (L) 07/17/2018 0226   PLT 173 06/09/2017 0935   MCV 93.8 07/17/2018 0226   MCV 93 06/09/2017 0935   MCH 30.1 07/17/2018 0226   MCHC 32.1 07/17/2018 0226   RDW 13.2 07/17/2018 0226   RDW 14.2 06/09/2017 0935    BMET    Component Value Date/Time   NA 134 (L) 07/14/2018 0332   NA 145 (H) 06/09/2017 0935   K 3.9 07/14/2018 0332   CL 108 07/14/2018 0332   CO2 16 (L) 07/14/2018 0332   GLUCOSE 91 07/14/2018 0332   BUN 8 07/14/2018 0332   BUN 8 06/09/2017 0935   CREATININE 0.82 07/14/2018 0332   CREATININE 0.93 07/28/2015 1034   CALCIUM 7.9 (L) 07/14/2018 0332   GFRNONAA >60 07/14/2018 0332   GFRAA >60 07/14/2018 0332    INR    Component Value Date/Time   INR 1.01 02/05/2018 0829     Intake/Output Summary (Last 24 hours) at 07/17/2018 0729 Last data filed at 07/17/2018 0531 Gross per 24 hour  Intake 1460 ml  Output -  Net 1460 ml     Assessment:  73 y.o. male is s/p:  Right lower extremity arteriogram, angioplasty right posterior tibial artery now occluded  4 Days Post-Op  Plan: -still pain free in his foot -will dc home today on Levaquin x 8 days -has f/u appt with Dr. Oneida Alar on 07/19/2018   Leontine Locket, PA-C [facility] [phone] 07/17/2018 7:29 AM

## 2018-07-17 NOTE — Discharge Summary (Signed)
Discharge Summary    Devin [name] 01/06/1946 73 y.o. male  [id number]  Admission Date: 07/11/2018  Discharge Date: 07/19/2018  Physician: Elam Dutch, MD  Admission Diagnosis: Critical lower limb ischemia [I99.8]   HPI:   This is a 73 y.o. male who presents with hx peripheral arterial disease. He has known history of CAD s/p CABG years ago, cardiac cath in 2007 showed patent grafts.  He has known history of hyperlipidemia and tobacco use. He is not diabetic.  He is known to have severe bilateral leg claudication with heavily calcified vessels. The patient underwent right common femoral artery endarterectomy and right femoral to PT bypass in February of 2019 followed byleft femoral to below-knee popliteal bypass with vein on September 16. He noticed some change in the color of the right foot about 2 weeks ago. He also noted some increased right leg pain with walking but not as much as before his surgery. He has no open wounds or ulceration. He had recent noninvasive vascular testing with Dr. Oneida Alar which unfortunately showed an occluded bypass on the right side with a drop in ABI to 0.37.  Hospital Course:  The patient was admitted to the hospital and taken to the operating room on 07/13/2018 and underwent: Procedure Performed: 1.  Thrombolytics catheter check of the right lower extremity with catheter exchange in the right common femoral to posterior tibial prosthetic bypass 2.  Angioplasty of the proximal bypass anastomosis (6 mm x 60 mm Mustang) 3.  Angioplasty of the distal bypass anastomosis (3 mm x 40 mm Sterling) 4.  Angioplasty of the right posterior tibial artery (3 mm x 150 mm Sterling) 5.  63 minutes of monitored conscious sedation time    Findings: Initial right lower extremity arteriogram through sheath injection after the Unifuse catheter was removed showed no filling of the right lower extremity bypass.  At that time I used a V 18 to cross the distal  anastomosis of the graft to the posterior tibial artery.  I suspected the patient had both an inflow and outflow issue given imaging from yesterday.  I initially ballooned the distal anastomosis at the posterior tibial artery with a 3 mm x 40 mm Sterling.  I then ballooned the proximal anastomosis of the graft with a 6 mm x 60 mm Mustang.  Hand-injection after angioplasty of the proximal and distal anastomosis now showed contrast filling the bypass with flow inline down to the posterior tibial.  There was still some remnant thrombus in the distal extent of the graft near the distal anastomosis and there was very poor runoff into the foot.  I then went down and selected a long 3 mm x 150 mm Sterling and performed balloon angioplasty of the entire right posterior tibial which was the runoff of the bypass.  Final injection again showed a patent graft now with flow down to the posterior tibial.  Again we observed thrombus in the distal bypass as well as very poor runoff into the foot.  Given my concern that his poor runoff into the foot was likely to reocclude the graft, we elected to put a lytics catheter back down further into the distal graft and posterior tibial artery and will perform thrombolytics again tonight to try and improve his outflow.  The pt tolerated the procedure well and was transported to the PACU in good condition.   He returned to the [facility] lab the next day and underwent: Right lower extremity arteriogram, angioplasty right posterior tibial artery  Operative findings: #1 reocclusion of right posterior tibial bypass graft no successful angioplasty no outflow vessel right foot  On 07/14/2018, pt bypass graft still occluded and was pain free.  He was transferred to [place].  Will probably need amp eventually but not pressed as long as no infection or pain.  On 07/15/2018, he was doing well but had low grade fever.  cxr revealed possible aspiration pna and on 2LO2NC.  Will need to keep and treat  with IV abx.  He was kept in the hospital.   On 07/17/2018, he was doing well.  He was transitioned to po abx and discharged home with plans to see Dr. Oneida Alar on 07/19/2018.   The remainder of the hospital course consisted of increasing mobilization and increasing intake of solids without difficulty.  CBC    Component Value Date/Time   WBC 6.6 07/17/2018 0226   RBC 3.36 (L) 07/17/2018 0226   HGB 10.1 (L) 07/17/2018 0226   HGB 13.7 06/09/2017 0935   HCT 31.5 (L) 07/17/2018 0226   HCT 39.9 06/09/2017 0935   PLT 133 (L) 07/17/2018 0226   PLT 173 06/09/2017 0935   MCV 93.8 07/17/2018 0226   MCV 93 06/09/2017 0935   MCH 30.1 07/17/2018 0226   MCHC 32.1 07/17/2018 0226   RDW 13.2 07/17/2018 0226   RDW 14.2 06/09/2017 0935    BMET    Component Value Date/Time   NA 134 (L) 07/14/2018 0332   NA 145 (H) 06/09/2017 0935   K 3.9 07/14/2018 0332   CL 108 07/14/2018 0332   CO2 16 (L) 07/14/2018 0332   GLUCOSE 91 07/14/2018 0332   BUN 8 07/14/2018 0332   BUN 8 06/09/2017 0935   CREATININE 0.82 07/14/2018 0332   CREATININE 0.93 07/28/2015 1034   CALCIUM 7.9 (L) 07/14/2018 0332   GFRNONAA >60 07/14/2018 0332   GFRAA >60 07/14/2018 0332      Discharge Instructions    Discharge patient   Complete by:  As directed    Discharge after Dr. Oneida Alar has seen pt.   Discharge disposition:  01-Home or Self Care   Discharge patient date:  07/17/2018      Discharge Diagnosis:  Critical lower limb ischemia [I99.8]  Secondary Diagnosis: Patient Active Problem List   Diagnosis Date Noted  . Critical lower limb ischemia 07/11/2018  . PAD (peripheral artery disease) ([place]) 07/03/2017  . Carotid artery disease without cerebral infarction ([employer]) 02/23/2012  . S/P shoulder surgery 02/15/2011  . Peripheral vascular disease with claudication ([place]) 02/09/2011  . S/P arthroscopy of shoulder 01/10/2011  . Shoulder arthritis 01/10/2011  . Nontraumatic rupture of tendons of biceps (long head)  01/10/2011  . Synovitis of shoulder 01/10/2011  . Arthritis, shoulder region 12/30/2010  . Biceps tendinopathy 12/30/2010  . Rotator cuff tendonitis 12/30/2010  . Shoulder pain 12/15/2010  . Rotator cuff syndrome 12/15/2010  . Rotator cuff tear, right 12/15/2010  . Disorders of bursae and tendons in shoulder region, unspecified 11/09/2010  . Hyperlipidemia 02/19/2009  . CAD, ARTERY BYPASS GRAFT 02/19/2009  . TRANSIENT ISCHEMIC ATTACKS, HX OF 02/19/2009  . CARPAL TUNNEL SYNDROME 02/13/2008   Past Medical History:  Diagnosis Date  . Arthritis   . Asthma   . Carpal tunnel syndrome   . Chest pain, unspecified   . Chronic kidney disease    hx of kidney stones  . Coronary artery disease   . Coronary atherosclerosis of artery bypass graft   . GERD (gastroesophageal reflux disease)   .  History of kidney stones   . HOH (hard of hearing)   . Hypertension   . 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 Other and unspecified hyperlipidemia   . Personal history of unspecified circulatory disease   . Shortness of breath   . Stroke ([place])   . TIA (transient ischemic attack)      Allergies as of 07/17/2018      Reactions   Aspirin Other (See Comments)   Has to take the coated aspirin      Medication List    TAKE these medications   albuterol 108 (90 Base) MCG/ACT inhaler Commonly known as:  PROVENTIL HFA;VENTOLIN HFA Inhale 2 puffs into the lungs every 6 (six) hours as needed for wheezing or shortness of breath.   amLODipine 5 MG tablet Commonly known as:  NORVASC Take 1 tablet (5 mg total) by mouth daily.   ASPIRIN LOW DOSE 81 MG EC tablet Generic drug:  aspirin Take 81 mg by mouth daily.   atorvastatin 40 MG tablet Commonly known as:  LIPITOR Take 1 tablet (40 mg total) by mouth daily.   calcium carbonate 1500 (600 Ca) MG Tabs tablet Commonly known as:  OSCAL Take 600 mg of elemental calcium by mouth daily with breakfast.     Cholecalciferol 125 MCG (5000 UT) Tabs Take 5,000 Units by mouth daily.   cilostazol 100 MG tablet Commonly known as:  PLETAL Take 1 tablet (100 mg total) by mouth 2 (two) times daily.   COSOPT 22.3-6.8 MG/ML ophthalmic solution Generic drug:  dorzolamide-timolol Place 1 drop into the right eye 2 (two) times daily.   IRON PO Take 1 tablet by mouth daily.   levofloxacin 500 MG tablet Commonly known as:  LEVAQUIN Take 1 tablet (500 mg total) by mouth daily for 10 days.   nitroGLYCERIN 0.4 MG SL tablet Commonly known as:  NITROSTAT Place 1 tablet (0.4 mg total) under the tongue every 5 (five) minutes as needed for chest pain.   omeprazole 20 MG tablet Commonly known as:  PRILOSEC OTC Take 20 mg by mouth daily.   SYMBICORT 80-4.5 MCG/ACT inhaler Generic drug:  budesonide-formoterol Inhale 2 puffs into the lungs 2 (two) times daily.   vitamin B-12 1000 MCG tablet Commonly known as:  CYANOCOBALAMIN Take 1,000 mcg by mouth daily.   zinc gluconate 50 MG tablet Take 50 mg by mouth daily.       Prescriptions given: 1.  Levaquin 500mg  qd #8 No refill    Instructions:   [facility]  Discharge Instructions  Lower Extremity Angiogram; Angioplasty/Stenting  Please refer to the following instructions for your post-procedure care. Your surgeon or physician assistant will discuss any changes with you.  Activity  Avoid lifting more than 8 pounds (1 gallons of milk) for 72 hours (3 days) after your procedure. You may walk as much as you can tolerate. It's OK to drive after 72 hours.  Bathing/Showering  You may shower the day after your procedure. If you have a bandage, you may remove it at 24- 48 hours. Clean your incision site with mild soap and water. Pat the area dry with a clean towel.  Diet  Resume your pre-procedure diet. There are no special food restrictions following this procedure. All patients with peripheral vascular disease  should follow a low fat/low cholesterol diet. In order to heal from your surgery, it is CRITICAL to get adequate nutrition. Your body requires vitamins,  minerals, and protein. Vegetables are the best source of vitamins and minerals. Vegetables also provide the perfect balance of protein. Processed food has little nutritional value, so try to avoid this.  Medications  Resume taking all of your medications unless your doctor tells you not to. If your incision is causing pain, you may take over-the-counter pain relievers such as acetaminophen (Tylenol)  Follow Up  Follow up will be arranged at the time of your procedure. You may have an office visit scheduled or may be scheduled for surgery. Ask your surgeon if you have any questions.  Please call us immediately for any of the following conditions: .Severe or worsening pain your legs or feet at rest or with walking. .Increased pain, redness, drainage at your groin puncture site. .Fever of 101 degrees or higher. .If you have any mild or slow bleeding from your puncture site: lie down, apply firm constant pressure over the area with a piece of gauze or a clean wash cloth for 30 minutes- no peeking!, call 911 right away if you are still bleeding after 30 minutes, or if the bleeding is heavy and unmanageable.  Reduce your risk factors of vascular disease:  . Stop smoking. If you would like help call [facility] at 1-800-QUIT-NOW 973-884-6406) or [place] at [phone]. . Manage your cholesterol . Maintain a desired weight . Control your diabetes . Keep your blood pressure down .  If you have any questions, please call the office at [phone]   Disposition: home  Patient's condition: is Good  Follow up: 1. Dr. Oneida Alar on 07/19/2018   Leontine Locket, PA-C [facility] [phone] 07/17/2018  7:40 AM

## 2018-07-17 NOTE — Progress Notes (Signed)
Patient in bed and had a short run of SVT rate 134 then back down to S.R. . No complaints voiced.

## 2018-07-18 DIAGNOSIS — E785 Hyperlipidemia, unspecified: Secondary | ICD-10-CM | POA: Diagnosis not present

## 2018-07-18 DIAGNOSIS — I10 Essential (primary) hypertension: Secondary | ICD-10-CM | POA: Diagnosis not present

## 2018-07-19 ENCOUNTER — Other Ambulatory Visit (HOSPITAL_COMMUNITY): Payer: Medicare Other

## 2018-07-19 ENCOUNTER — Ambulatory Visit: Payer: Medicare Other | Admitting: Vascular Surgery

## 2018-07-19 ENCOUNTER — Encounter: Payer: Self-pay | Admitting: Vascular Surgery

## 2018-07-19 ENCOUNTER — Other Ambulatory Visit: Payer: Self-pay

## 2018-07-19 VITALS — BP 139/64 | HR 76 | Temp 97.8°F | Resp 14 | Ht 64.0 in | Wt 148.0 lb

## 2018-07-19 DIAGNOSIS — I83813 Varicose veins of bilateral lower extremities with pain: Secondary | ICD-10-CM

## 2018-07-19 DIAGNOSIS — I739 Peripheral vascular disease, unspecified: Secondary | ICD-10-CM

## 2018-07-19 NOTE — Progress Notes (Signed)
Patient is a 73 year old male who returns for follow-up today.  He was recently in the hospital for thrombolysis of a thrombosed right femoral to posterior tibial artery bypass graft.  Unfortunately we could not salvage this bypass graft.  He currently has no wounds on the right foot.  He has no rest pain.  He is finishing up a course of antibiotics for a hospital-acquired pneumonia.  He feels well overall.  He has no complaints in the left side.  Physical exam:  Vitals:   07/19/18 1024  BP: 139/64  Pulse: 76  Resp: 14  Temp: 97.8 F (36.6 C)  TempSrc: Oral  SpO2: 98%  Weight: 148 lb 0.6 oz (67.1 kg)  Height: 5\' 4"  (1.626 m)    Extremities: No palpable pedal pulses some dry skin around the toes in the right foot.  Right foot is slightly cooler than the left.  No open ulcers or wounds.  Chest: Distant breath sounds but clear to auscultation bilaterally  Assessment: Viable right foot despite occluded right femoral posterior tibial bypass graft.  No further options for revascularization in the right leg.  Left leg bypass is patent.  Plan: Patient was instructed today to protect his right foot and call us if he develops any wounds or starts to develop rest pain in the right foot.  Really the only option long-term would most likely be a right above-knee amputation.  I would only consider this if he has nonhealing wounds with risk of a sending infection or debilitating rest pain.  The patient will follow-up with [place] with a graft scan of the left leg and bilateral ABIs in 1 month.  [name], MD [facility] [place] Office: [phone] Pager: [phone]

## 2018-07-23 DIAGNOSIS — I739 Peripheral vascular disease, unspecified: Secondary | ICD-10-CM | POA: Diagnosis not present

## 2018-07-23 DIAGNOSIS — I10 Essential (primary) hypertension: Secondary | ICD-10-CM | POA: Diagnosis not present

## 2018-07-23 DIAGNOSIS — I2510 Atherosclerotic heart disease of native coronary artery without angina pectoris: Secondary | ICD-10-CM | POA: Diagnosis not present

## 2018-07-23 DIAGNOSIS — J4540 Moderate persistent asthma, uncomplicated: Secondary | ICD-10-CM | POA: Diagnosis not present

## 2018-07-23 DIAGNOSIS — E785 Hyperlipidemia, unspecified: Secondary | ICD-10-CM | POA: Diagnosis not present

## 2018-08-01 DIAGNOSIS — H40053 Ocular hypertension, bilateral: Secondary | ICD-10-CM | POA: Diagnosis not present

## 2018-08-30 ENCOUNTER — Other Ambulatory Visit (HOSPITAL_COMMUNITY): Payer: Medicare Other

## 2018-08-30 ENCOUNTER — Ambulatory Visit: Payer: Medicare Other | Admitting: Vascular Surgery

## 2018-08-30 ENCOUNTER — Encounter (HOSPITAL_COMMUNITY): Payer: Medicare Other

## 2018-10-11 ENCOUNTER — Telehealth: Payer: Self-pay

## 2018-10-11 NOTE — Telephone Encounter (Signed)

## 2018-10-15 ENCOUNTER — Telehealth: Payer: Self-pay | Admitting: Cardiovascular Disease

## 2018-10-16 ENCOUNTER — Encounter: Payer: Self-pay | Admitting: Cardiovascular Disease

## 2018-10-16 ENCOUNTER — Telehealth (INDEPENDENT_AMBULATORY_CARE_PROVIDER_SITE_OTHER): Payer: Medicare Other | Admitting: Cardiovascular Disease

## 2018-10-16 VITALS — BP 142/81 | HR 69 | Temp 97.9°F | Ht 64.0 in | Wt 152.0 lb

## 2018-10-16 DIAGNOSIS — I739 Peripheral vascular disease, unspecified: Secondary | ICD-10-CM

## 2018-10-16 NOTE — Progress Notes (Signed)
Virtual Visit via Video Note   This visit type was conducted due to national recommendations for restrictions regarding the COVID-19 Pandemic (e.g. social distancing) in an effort to limit this patient's exposure and mitigate transmission in our community.  Due to his co-morbid illnesses, this patient is at least at moderate risk for complications without adequate follow up.  This format is felt to be most appropriate for this patient at this time.  All issues noted in this document were discussed and addressed.  A limited physical exam was performed with this format.  Please refer to the patient's chart for his consent to telehealth for [facility].   Date:  10/16/2018   ID:  Devin [name], DOB 1945-11-11, MRN [id number]  Patient Location: Home Provider Location: Office  PCP:  [name], MD  Cardiologist:  Dr. Burt Knack Electrophysiologist:  None   Evaluation Performed:  Follow-Up Visit  Chief Complaint: No complaints today.  History of Present Illness:    Devin [name] is a 73 y.o. male who was seen via video visit for follow-up regarding peripheral arterial disease. He has known history of CAD s/p CABG, cardiac cath in 2007 showed patent grafts, hyperlipidemia and tobacco use. He is not diabetic.  He is status post bilateral femoropopliteal bypass.  Unfortunately, the right femoropopliteal bypass occluded earlier this year and could not be salvaged.  Medical therapy was continued.  In spite of that, the patient denies any ulceration or gangrene.  He reports no rest pain and he has been able to do his yard work.  He takes his medications regularly.  No chest pain or shortness of breath.  The patient does not have symptoms concerning for COVID-19 infection (fever, chills, cough, or new shortness of breath).    Past Medical History:  Diagnosis Date  . Arthritis   . Asthma   . Carpal tunnel syndrome   . Chest pain, unspecified   . Chronic kidney disease    hx of kidney  stones  . Coronary artery disease   . Coronary atherosclerosis of artery bypass graft   . GERD (gastroesophageal reflux disease)   . History of kidney stones   . HOH (hard of hearing)   . Hypertension   . 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 Other and unspecified hyperlipidemia   . Personal history of unspecified circulatory disease   . Shortness of breath   . Stroke ([place])   . TIA (transient ischemic attack)    Past Surgical History:  Procedure Laterality Date  . ABDOMINAL AORTOGRAM N/A 06/14/2017   Procedure: ABDOMINAL AORTOGRAM;  Surgeon: Wellington Hampshire, MD;  Location: [place] [facility];  Service: Cardiovascular;  Laterality: N/A;  . ABDOMINAL AORTOGRAM N/A 01/26/2018   Procedure: ABDOMINAL AORTOGRAM;  Surgeon: Elam Dutch, MD;  Location: [place] [facility];  Service: Cardiovascular;  Laterality: N/A;  . ABDOMINAL AORTOGRAM N/A 07/11/2018   Procedure: ABDOMINAL AORTOGRAM;  Surgeon: Marty Heck, MD;  Location: [place] [facility];  Service: Cardiovascular;  Laterality: N/A;  . BACK SURGERY     cervical disc  . CARDIAC CATHETERIZATION    . CARPAL TUNNEL RELEASE     blateral carpal tunnel  . CORONARY ARTERY BYPASS GRAFT     x5 12 yrs ago  . ENDARTERECTOMY FEMORAL Right 07/03/2017   Procedure: ENDARTERECTOMY FEMORAL RIGHT;  Surgeon: Elam Dutch, MD;  Location: [place];  Service: Vascular;  Laterality: Right;  . FEMORAL-POPLITEAL  BYPASS GRAFT Right 07/03/2017   Procedure: BYPASS GRAFT FEMORAL-POPLITEAL ARTERY;  Surgeon: Elam Dutch, MD;  Location: [place];  Service: Vascular;  Laterality: Right;  . FEMORAL-POPLITEAL BYPASS GRAFT Left 02/12/2018   Procedure: BYPASS GRAFT FEMORAL-POPLITEAL ARTERY LEFT LEG;  Surgeon: Elam Dutch, MD;  Location: [place];  Service: Vascular;  Laterality: Left;  . LOWER EXTREMITY ANGIOGRAM Bilateral 08/05/2015   Procedure: Lower Extremity Angiogram;  Surgeon: Wellington Hampshire, MD;   Location: [place] [facility];  Service: Cardiovascular;  Laterality: Bilateral;  . LOWER EXTREMITY ANGIOGRAPHY Bilateral 06/14/2017   Procedure: Lower Extremity Angiography;  Surgeon: Wellington Hampshire, MD;  Location: [place] [facility];  Service: Cardiovascular;  Laterality: Bilateral;  . LOWER EXTREMITY ANGIOGRAPHY Bilateral 01/26/2018   Procedure: LOWER EXTREMITY ANGIOGRAPHY;  Surgeon: Elam Dutch, MD;  Location: [place] [facility];  Service: Cardiovascular;  Laterality: Bilateral;  . LOWER EXTREMITY ANGIOGRAPHY Right 07/11/2018   Procedure: LOWER EXTREMITY ANGIOGRAPHY;  Surgeon: Marty Heck, MD;  Location: [place] [facility];  Service: Cardiovascular;  Laterality: Right;  thrombolysis fem/tib bypass graft  . NECK SURGERY    . PATCH ANGIOPLASTY Right 07/03/2017   Procedure: VEIN PATCH ANGIOPLASTY OF FEMORAL AND POSTERIOR TIBIAL ARTERIES;  Surgeon: Elam Dutch, MD;  Location: [place];  Service: Vascular;  Laterality: Right;  . PERIPHERAL VASCULAR BALLOON ANGIOPLASTY Right 07/12/2018   Procedure: PERIPHERAL VASCULAR BALLOON ANGIOPLASTY;  Surgeon: Marty Heck, MD;  Location: [place] [facility];  Service: Cardiovascular;  Laterality: Right;  Right fem posterior tibial graft and posterior tibial artery.  Marland Kitchen PERIPHERAL VASCULAR BALLOON ANGIOPLASTY Right 07/13/2018   Procedure: PERIPHERAL VASCULAR BALLOON ANGIOPLASTY;  Surgeon: Elam Dutch, MD;  Location: [place] [facility];  Service: Cardiovascular;  Laterality: Right;  PT  . PERIPHERAL VASCULAR CATHETERIZATION N/A 08/05/2015   Procedure: Abdominal Aortogram;  Surgeon: Wellington Hampshire, MD;  Location: [place] [facility];  Service: Cardiovascular;  Laterality: N/A;  . PERIPHERAL VASCULAR THROMBECTOMY N/A 07/12/2018   Procedure: PERIPHERAL VASCULAR THROMBECTOMY - LYSIS RECHECK;  Surgeon: Marty Heck, MD;  Location: [place] [facility];  Service: Cardiovascular;  Laterality: N/A;  . PERIPHERAL VASCULAR THROMBECTOMY Right  07/13/2018   Procedure: PERIPHERAL VASCULAR THROMBECTOMY;  Surgeon: Elam Dutch, MD;  Location: [place] [facility];  Service: Cardiovascular;  Laterality: Right;  . SHOULDER ARTHROSCOPY W/ ROTATOR CUFF REPAIR     Right  . VEIN HARVEST Left 02/12/2018   Procedure: VEIN HARVEST;  Surgeon: Elam Dutch, MD;  Location: [facility];  Service: Vascular;  Laterality: Left;     Current Meds  Medication Sig  . albuterol (PROVENTIL HFA;VENTOLIN HFA) 108 (90 Base) MCG/ACT inhaler Inhale 2 puffs into the lungs every 6 (six) hours as needed for wheezing or shortness of breath.  Marland Kitchen amLODipine (NORVASC) 5 MG tablet Take 1 tablet (5 mg total) by mouth daily.  Marland Kitchen aspirin (ASPIRIN LOW DOSE) 81 MG EC tablet Take 81 mg by mouth daily.    Marland Kitchen atorvastatin (LIPITOR) 40 MG tablet Take 1 tablet (40 mg total) by mouth daily.  . calcium carbonate (OSCAL) 1500 (600 Ca) MG TABS tablet Take 600 mg of elemental calcium by mouth daily with breakfast.  . Cholecalciferol 5000 units TABS Take 5,000 Units by mouth daily.  . cilostazol (PLETAL) 100 MG tablet Take 1 tablet (100 mg total) by mouth 2 (two) times daily.  . dorzolamide-timolol (COSOPT) 22.3-6.8 MG/ML ophthalmic solution Place 1 drop into the right eye 2 (  two) times daily.   . IRON PO Take 1 tablet by mouth daily.  Marland Kitchen latanoprost (XALATAN) 0.005 % ophthalmic solution 1 drop at bedtime.  . nitroGLYCERIN (NITROSTAT) 0.4 MG SL tablet Place 1 tablet (0.4 mg total) under the tongue every 5 (five) minutes as needed for chest pain.  Marland Kitchen omeprazole (PRILOSEC OTC) 20 MG tablet Take 20 mg by mouth daily.    . SYMBICORT 80-4.5 MCG/ACT inhaler Inhale 2 puffs into the lungs 2 (two) times daily.   . vitamin B-12 (CYANOCOBALAMIN) 1000 MCG tablet Take 1,000 mcg by mouth daily.  Marland Kitchen zinc gluconate 50 MG tablet Take 50 mg by mouth daily.     Allergies:   Aspirin   Social History   Tobacco Use  . Smoking status: Former Smoker    Packs/day: 1.00    Years: 50.00    Pack years: 50.00     Types: Cigarettes, Cigars    Last attempt to quit: 06/07/2017    Years since quitting: 1.3  . Smokeless tobacco: Never Used  Substance Use Topics  . Alcohol use: No    Alcohol/week: 0.0 standard drinks  . Drug use: No     Family Hx: The patient's family history includes Arthritis in an other family member; Asthma in an other family member; Coronary artery disease in an other family member; Heart disease in an other family member. There is no history of Anesthesia problems, Hypotension, Malignant hyperthermia, or Pseudochol deficiency.  ROS:   Please see the history of present illness.     All other systems reviewed and are negative.   Prior CV studies:   The following studies were reviewed today:  Non  Labs/Other Tests and Data Reviewed:    EKG:  No ECG reviewed.  Recent Labs: 02/05/2018: ALT 30 07/14/2018: BUN 8; Creatinine, Ser 0.82; Magnesium 1.7; Potassium 3.9; Sodium 134 07/17/2018: Hemoglobin 10.1; Platelets 133   Recent Lipid Panel No results found for: CHOL, TRIG, HDL, CHOLHDL, LDLCALC, LDLDIRECT  Wt Readings from Last 3 Encounters:  10/16/18 152 lb (68.9 kg)  07/19/18 148 lb 0.6 oz (67.1 kg)  07/14/18 149 lb 11.1 oz (67.9 kg)     Objective:    Vital Signs:  BP (!) 142/81   Pulse 69   Temp 97.9 F (36.6 C)   Ht 5\' 4"  (1.626 m)   Wt 152 lb (68.9 kg)   BMI 26.09 kg/m    VITAL SIGNS:  reviewed GEN:  no acute distress EYES:  sclerae anicteric, EOMI - Extraocular Movements Intact RESPIRATORY:  normal respiratory effort, symmetric expansion SKIN:  no rash, lesions or ulcers. MUSCULOSKELETAL:  no obvious deformities. NEURO:  alert and oriented x 3, no obvious focal deficit PSYCH:  normal affect  ASSESSMENT & PLAN:    1. PAD : Status post bilateral femoral popliteal bypass but with occluded bypass on the right side.  At the present time, he has no severe claudication or evidence of critical limb ischemia.  I recommend continuing medical therapy.  I  discussed with him the importance of regular walking program and staying away from tobacco use.  He has not been smoking but he is exposed to secondhand smoking.  2. CAD status post CABG: The patient is stable and has no symptoms of angina. Continue aspirin daily.   3. Hyperlipidemia: Continue Atorvastatin with a target LDL <70.   4. Tobacco abuse: He quit smoking in 2019.  I discussed with him the importance of avoidance of secondhand smoking.  5.  Essential  hypertension: Blood pressure is controlled on current medications.    COVID-19 Education: The signs and symptoms of COVID-19 were discussed with the patient and how to seek care for testing (follow up with PCP or arrange E-visit).  The importance of social distancing was discussed today.  Time:   Today, I have spent 12 minutes with the patient with telehealth technology discussing the above problems.     Medication Adjustments/Labs and Tests Ordered: Current medicines are reviewed at length with the patient today.  Concerns regarding medicines are outlined above.   Tests Ordered: No orders of the defined types were placed in this encounter.   Medication Changes: No orders of the defined types were placed in this encounter.   Disposition:  Follow up in 4 month(s)  Signed, [name], MD  10/16/2018 9:26 AM    [place]

## 2018-10-16 NOTE — Patient Instructions (Signed)
Medication Instructions:  Continue same medications If you need a refill on your cardiac medications before your next appointment, please call your pharmacy.   Lab work: None If you have labs (blood work) drawn today and your tests are completely normal, you will receive your results only by: Marland Kitchen MyChart Message (if you have MyChart) OR . A paper copy in the mail If you have any lab test that is abnormal or we need to change your treatment, we will call you to review the results.  Testing/Procedures: None  Follow-Up: Follow-up with Dr. Fletcher Anon in 4 months

## 2018-10-19 DIAGNOSIS — E785 Hyperlipidemia, unspecified: Secondary | ICD-10-CM | POA: Diagnosis not present

## 2018-10-19 DIAGNOSIS — I10 Essential (primary) hypertension: Secondary | ICD-10-CM | POA: Diagnosis not present

## 2018-10-24 ENCOUNTER — Telehealth: Payer: Self-pay

## 2018-10-24 DIAGNOSIS — D509 Iron deficiency anemia, unspecified: Secondary | ICD-10-CM | POA: Diagnosis not present

## 2018-10-24 DIAGNOSIS — E785 Hyperlipidemia, unspecified: Secondary | ICD-10-CM | POA: Diagnosis not present

## 2018-10-24 DIAGNOSIS — I2510 Atherosclerotic heart disease of native coronary artery without angina pectoris: Secondary | ICD-10-CM | POA: Diagnosis not present

## 2018-10-24 DIAGNOSIS — I10 Essential (primary) hypertension: Secondary | ICD-10-CM | POA: Diagnosis not present

## 2018-10-24 DIAGNOSIS — I739 Peripheral vascular disease, unspecified: Secondary | ICD-10-CM | POA: Diagnosis not present

## 2018-10-24 NOTE — Telephone Encounter (Signed)
YOUR CARDIOLOGY TEAM HAS ARRANGED FOR AN E-VISIT FOR YOUR APPOINTMENT - PLEASE REVIEW IMPORTANT INFORMATION BELOW SEVERAL DAYS PRIOR TO YOUR APPOINTMENT  Due to the recent COVID-19 pandemic, we are transitioning in-person office visits to tele-medicine visits in an effort to decrease unnecessary exposure to our patients, their families, and staff. These visits are billed to your insurance just like a normal visit is. We also encourage you to sign up for MyChart if you have not already done so. You will need a smartphone if possible. For patients that do not have this, we can still complete the visit using a regular telephone but do prefer a smartphone to enable video when possible. You may have a family member that lives with you that can help. If possible, we also ask that you have a blood pressure cuff and scale at home to measure your blood pressure, heart rate and weight prior to your scheduled appointment. Patients with clinical needs that need an in-person evaluation and testing will still be able to come to the office if absolutely necessary. If you have any questions, feel free to call our office.     YOUR PROVIDER WILL BE USING THE FOLLOWING PLATFORM TO COMPLETE YOUR VISIT: Doximity  . IF USING MYCHART - How to Download the MyChart App to Your SmartPhone   - If Apple, go to App Store and type in MyChart in the search bar and download the app. If Android, ask patient to go to Google Play Store and type in MyChart in the search bar and download the app. The app is free but as with any other app downloads, your phone may require you to verify saved payment information or Apple/Android password.  - You will need to then log into the app with your MyChart username and password, and select [facility] as your healthcare provider to link the account.  - When it is time for your visit, go to the MyChart app, find appointments, and click Begin Video Visit. Be sure to Select Allow for your device to  access the Microphone and Camera for your visit. You will then be connected, and your provider will be with you shortly.  **If you have any issues connecting or need assistance, please contact MyChart service desk [phone] ([phone])**  **If using a computer, in order to ensure the best quality for your visit, you will need to use either of the following Internet Browsers: Google Chrome or Microsoft Edge**  . IF USING DOXIMITY or [email or web address] - The staff will give you instructions on receiving your link to join the meeting the day of your visit.      2-3 DAYS BEFORE YOUR APPOINTMENT  You will receive a telephone call from one of our [facility] team members - your caller ID may say "Unknown caller." If this is a video visit, we will walk you through how to get the video launched on your phone. We will remind you check your blood pressure, heart rate and weight prior to your scheduled appointment. If you have an Apple Watch or Kardia, please upload any pertinent ECG strips the day before or morning of your appointment to MyChart. Our staff will also make sure you have reviewed the consent and agree to move forward with your scheduled tele-health visit.     THE DAY OF YOUR APPOINTMENT  Approximately 15 minutes prior to your scheduled appointment, you will receive a telephone call from one of HeartCare team - your caller ID may say "Unknown caller."    Our staff will confirm medications, vital signs for the day and any symptoms you may be experiencing. Please have this information available prior to the time of visit start. It may also be helpful for you to have a pad of paper and pen handy for any instructions given during your visit. They will also walk you through joining the smartphone meeting if this is a video visit.    CONSENT FOR TELE-HEALTH VISIT - PLEASE REVIEW  I hereby voluntarily request, consent and authorize [facility] and its employed or contracted physicians, physician  assistants, nurse practitioners or other licensed health care professionals (the Practitioner), to provide me with telemedicine health care services (the "Services") as deemed necessary by the treating Practitioner. I acknowledge and consent to receive the Services by the Practitioner via telemedicine. I understand that the telemedicine visit will involve communicating with the Practitioner through live audiovisual communication technology and the disclosure of certain medical information by electronic transmission. I acknowledge that I have been given the opportunity to request an in-person assessment or other available alternative prior to the telemedicine visit and am voluntarily participating in the telemedicine visit.  I understand that I have the right to withhold or withdraw my consent to the use of telemedicine in the course of my care at any time, without affecting my right to future care or treatment, and that the Practitioner or I may terminate the telemedicine visit at any time. I understand that I have the right to inspect all information obtained and/or recorded in the course of the telemedicine visit and may receive copies of available information for a reasonable fee.  I understand that some of the potential risks of receiving the Services via telemedicine include:  . Delay or interruption in medical evaluation due to technological equipment failure or disruption; . Information transmitted may not be sufficient (e.g. poor resolution of images) to allow for appropriate medical decision making by the Practitioner; and/or  . In rare instances, security protocols could fail, causing a breach of personal health information.  Furthermore, I acknowledge that it is my responsibility to provide information about my medical history, conditions and care that is complete and accurate to the best of my ability. I acknowledge that Practitioner's advice, recommendations, and/or decision may be based on  factors not within their control, such as incomplete or inaccurate data provided by me or distortions of diagnostic images or specimens that may result from electronic transmissions. I understand that the practice of medicine is not an exact science and that Practitioner makes no warranties or guarantees regarding treatment outcomes. I acknowledge that I will receive a copy of this consent concurrently upon execution via email to the email address I last provided but may also request a printed copy by calling the office of [facility].    I understand that my insurance will be billed for this visit.   I have read or had this consent read to me. . I understand the contents of this consent, which adequately explains the benefits and risks of the Services being provided via telemedicine.  . I have been provided ample opportunity to ask questions regarding this consent and the Services and have had my questions answered to my satisfaction. . I give my informed consent for the services to be provided through the use of telemedicine in my medical care  By participating in this telemedicine visit I agree to the above.  

## 2018-10-26 ENCOUNTER — Telehealth (INDEPENDENT_AMBULATORY_CARE_PROVIDER_SITE_OTHER): Payer: Medicare Other | Admitting: Cardiovascular Disease

## 2018-10-26 ENCOUNTER — Other Ambulatory Visit: Payer: Self-pay

## 2018-10-26 ENCOUNTER — Encounter: Payer: Self-pay | Admitting: Cardiovascular Disease

## 2018-10-26 VITALS — BP 118/68 | HR 72 | Temp 98.7°F | Ht 64.0 in | Wt 152.0 lb

## 2018-10-26 DIAGNOSIS — I2510 Atherosclerotic heart disease of native coronary artery without angina pectoris: Secondary | ICD-10-CM

## 2018-10-26 DIAGNOSIS — I739 Peripheral vascular disease, unspecified: Secondary | ICD-10-CM

## 2018-10-26 DIAGNOSIS — E782 Mixed hyperlipidemia: Secondary | ICD-10-CM

## 2018-10-26 DIAGNOSIS — I10 Essential (primary) hypertension: Secondary | ICD-10-CM

## 2018-10-26 NOTE — Progress Notes (Signed)
Virtual Visit via Telephone Note   This visit type was conducted due to national recommendations for restrictions regarding the COVID-19 Pandemic (e.g. social distancing) in an effort to limit this patient's exposure and mitigate transmission in our community.  Due to his co-morbid illnesses, this patient is at least at moderate risk for complications without adequate follow up.  This format is felt to be most appropriate for this patient at this time.  The patient did not have access to video technology/had technical difficulties with video requiring transitioning to audio format only (telephone).  All issues noted in this document were discussed and addressed.  No physical exam could be performed with this format.  Please refer to the patient's chart for his  consent to telehealth for [facility].   Date:  10/26/2018   ID:  Devin [name], DOB [birth year], MRN [id number]  Patient Location: Home Provider Location: Home  PCP:  [name], MD  Cardiologist:  No primary care provider on file.  Electrophysiologist:  None   Evaluation Performed:  Follow-Up Visit  Chief Complaint:  Follow-up CAD  History of Present Illness:    Devin [name] is a 73 y.o. male with history of coronary and peripheral arterial disease, presenting for follow-up evaluation today via video conferencing technology in light of the current COVID-19 pandemic.  The patient underwent CABG remotely and most recent heart catheterization in 2007 demonstrated patent bypass grafts.  He also has a history of hyperlipidemia and tobacco use.  He has had multiple lower extremity revascularization surgeries, including femoral artery endarterectomy and right femoral to below-knee popliteal bypass.  He also underwent left femoral to below-knee bypass. He's been followed by Dr Fletcher Anon and Dr Oneida Alar.  His last angiogram demonstrated occlusion of tibial graft and poor flow to the foot.  Fortunately he has not developed severe rest  pain or ulceration and he is tolerating symptoms of intermittent claudication involving the foot.  He denies chest pain, chest pressure, or shortness of breath.  He has no orthopnea or PND.  He had a syncopal episode last year and was evaluated with an echocardiogram and event monitor revealing normal cardiac function with no major valvular abnormalities and no significant arrhythmias.  He has had no further symptoms of lightheadedness or syncope.  He was taking Flomax at the time and has discontinued this drug.  The patient does not have symptoms concerning for COVID-19 infection (fever, chills, cough, or new shortness of breath).    Past Medical History:  Diagnosis Date  . Arthritis   . Asthma   . Carpal tunnel syndrome   . Chest pain, unspecified   . Chronic kidney disease    hx of kidney stones  . Coronary artery disease   . Coronary atherosclerosis of artery bypass graft   . GERD (gastroesophageal reflux disease)   . History of kidney stones   . HOH (hard of hearing)   . Hypertension   . 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 Other and unspecified hyperlipidemia   . Personal history of unspecified circulatory disease   . Shortness of breath   . Stroke ([place])   . TIA (transient ischemic attack)    Past Surgical History:  Procedure Laterality Date  . ABDOMINAL AORTOGRAM N/A 06/14/2017   Procedure: ABDOMINAL AORTOGRAM;  Surgeon: Wellington Hampshire, MD;  Location: [place] [facility];  Service: Cardiovascular;  Laterality: N/A;  . ABDOMINAL AORTOGRAM N/A 01/26/2018   Procedure: ABDOMINAL  AORTOGRAM;  Surgeon: Elam Dutch, MD;  Location: [place] [facility];  Service: Cardiovascular;  Laterality: N/A;  . ABDOMINAL AORTOGRAM N/A 07/11/2018   Procedure: ABDOMINAL AORTOGRAM;  Surgeon: Marty Heck, MD;  Location: [place] [facility];  Service: Cardiovascular;  Laterality: N/A;  . BACK SURGERY     cervical disc  . CARDIAC  CATHETERIZATION    . CARPAL TUNNEL RELEASE     blateral carpal tunnel  . CORONARY ARTERY BYPASS GRAFT     x5 12 yrs ago  . ENDARTERECTOMY FEMORAL Right 07/03/2017   Procedure: ENDARTERECTOMY FEMORAL RIGHT;  Surgeon: Elam Dutch, MD;  Location: [place];  Service: Vascular;  Laterality: Right;  . FEMORAL-POPLITEAL BYPASS GRAFT Right 07/03/2017   Procedure: BYPASS GRAFT FEMORAL-POPLITEAL ARTERY;  Surgeon: Elam Dutch, MD;  Location: [place];  Service: Vascular;  Laterality: Right;  . FEMORAL-POPLITEAL BYPASS GRAFT Left 02/12/2018   Procedure: BYPASS GRAFT FEMORAL-POPLITEAL ARTERY LEFT LEG;  Surgeon: Elam Dutch, MD;  Location: [place];  Service: Vascular;  Laterality: Left;  . LOWER EXTREMITY ANGIOGRAM Bilateral 08/05/2015   Procedure: Lower Extremity Angiogram;  Surgeon: Wellington Hampshire, MD;  Location: [place] [facility];  Service: Cardiovascular;  Laterality: Bilateral;  . LOWER EXTREMITY ANGIOGRAPHY Bilateral 06/14/2017   Procedure: Lower Extremity Angiography;  Surgeon: Wellington Hampshire, MD;  Location: [place] [facility];  Service: Cardiovascular;  Laterality: Bilateral;  . LOWER EXTREMITY ANGIOGRAPHY Bilateral 01/26/2018   Procedure: LOWER EXTREMITY ANGIOGRAPHY;  Surgeon: Elam Dutch, MD;  Location: [place] [facility];  Service: Cardiovascular;  Laterality: Bilateral;  . LOWER EXTREMITY ANGIOGRAPHY Right 07/11/2018   Procedure: LOWER EXTREMITY ANGIOGRAPHY;  Surgeon: Marty Heck, MD;  Location: [place] [facility];  Service: Cardiovascular;  Laterality: Right;  thrombolysis fem/tib bypass graft  . NECK SURGERY    . PATCH ANGIOPLASTY Right 07/03/2017   Procedure: VEIN PATCH ANGIOPLASTY OF FEMORAL AND POSTERIOR TIBIAL ARTERIES;  Surgeon: Elam Dutch, MD;  Location: [place];  Service: Vascular;  Laterality: Right;  . PERIPHERAL VASCULAR BALLOON ANGIOPLASTY Right 07/12/2018   Procedure: PERIPHERAL VASCULAR BALLOON ANGIOPLASTY;  Surgeon: Marty Heck, MD;  Location: [place] [facility];  Service: Cardiovascular;  Laterality: Right;  Right fem posterior tibial graft and posterior tibial artery.  Marland Kitchen PERIPHERAL VASCULAR BALLOON ANGIOPLASTY Right 07/13/2018   Procedure: PERIPHERAL VASCULAR BALLOON ANGIOPLASTY;  Surgeon: Elam Dutch, MD;  Location: [place] [facility];  Service: Cardiovascular;  Laterality: Right;  PT  . PERIPHERAL VASCULAR CATHETERIZATION N/A 08/05/2015   Procedure: Abdominal Aortogram;  Surgeon: Wellington Hampshire, MD;  Location: [place] [facility];  Service: Cardiovascular;  Laterality: N/A;  . PERIPHERAL VASCULAR THROMBECTOMY N/A 07/12/2018   Procedure: PERIPHERAL VASCULAR THROMBECTOMY - LYSIS RECHECK;  Surgeon: Marty Heck, MD;  Location: [place] [facility];  Service: Cardiovascular;  Laterality: N/A;  . PERIPHERAL VASCULAR THROMBECTOMY Right 07/13/2018   Procedure: PERIPHERAL VASCULAR THROMBECTOMY;  Surgeon: Elam Dutch, MD;  Location: [place] [facility];  Service: Cardiovascular;  Laterality: Right;  . SHOULDER ARTHROSCOPY W/ ROTATOR CUFF REPAIR     Right  . VEIN HARVEST Left 02/12/2018   Procedure: VEIN HARVEST;  Surgeon: Elam Dutch, MD;  Location[facility];  Service: Vascular;  Laterality: Left;     Current Meds  Medication Sig  . albuterol (PROVENTIL HFA;VENTOLIN HFA) 108 (90 Base) MCG/ACT inhaler Inhale 2 puffs into the lungs every 6 (six) hours as needed for wheezing or shortness  of breath.  Marland Kitchen amLODipine (NORVASC) 5 MG tablet Take 1 tablet (5 mg total) by mouth daily.  Marland Kitchen aspirin (ASPIRIN LOW DOSE) 81 MG EC tablet Take 81 mg by mouth daily.    Marland Kitchen atorvastatin (LIPITOR) 40 MG tablet Take 1 tablet (40 mg total) by mouth daily.  . calcium carbonate (OSCAL) 1500 (600 Ca) MG TABS tablet Take 600 mg of elemental calcium by mouth daily with breakfast.  . Cholecalciferol 5000 units TABS Take 5,000 Units by mouth daily.  . cilostazol (PLETAL) 100 MG tablet Take 1 tablet (100 mg total) by mouth 2 (two) times daily.  .  dorzolamide-timolol (COSOPT) 22.3-6.8 MG/ML ophthalmic solution Place 1 drop into the right eye 2 (two) times daily.   . IRON PO Take 1 tablet by mouth daily.  Marland Kitchen latanoprost (XALATAN) 0.005 % ophthalmic solution 1 drop at bedtime.  . nitroGLYCERIN (NITROSTAT) 0.4 MG SL tablet Place 1 tablet (0.4 mg total) under the tongue every 5 (five) minutes as needed for chest pain.  Marland Kitchen omeprazole (PRILOSEC OTC) 20 MG tablet Take 20 mg by mouth daily.    . SYMBICORT 80-4.5 MCG/ACT inhaler Inhale 2 puffs into the lungs 2 (two) times daily.   . vitamin B-12 (CYANOCOBALAMIN) 1000 MCG tablet Take 1,000 mcg by mouth daily.  Marland Kitchen zinc gluconate 50 MG tablet Take 50 mg by mouth daily.     Allergies:   Aspirin   Social History   Tobacco Use  . Smoking status: Former Smoker    Packs/day: 1.00    Years: 50.00    Pack years: 50.00    Types: Cigarettes, Cigars    Last attempt to quit: 06/07/2017    Years since quitting: 1.3  . Smokeless tobacco: Never Used  Substance Use Topics  . Alcohol use: No    Alcohol/week: 0.0 standard drinks  . Drug use: No     Family Hx: The patient's family history includes Arthritis in an other family member; Asthma in an other family member; Coronary artery disease in an other family member; Heart disease in an other family member. There is no history of Anesthesia problems, Hypotension, Malignant hyperthermia, or Pseudochol deficiency.  ROS:   Please see the history of present illness.    Positive for leg pain.  All other systems reviewed and are negative.   Prior CV studies:   The following studies were reviewed today:  Echo 04/30/2018: Study Conclusions  - Left ventricle: The cavity size was at the upper limits of   normal. Wall thickness was normal. Systolic function was normal.   The estimated ejection fraction was in the range of 55% to 60%.   Wall motion was normal; there were no regional wall motion   abnormalities. Doppler parameters are consistent with abnormal    left ventricular relaxation (grade 1 diastolic dysfunction). - Mitral valve: There was mild regurgitation. - Left atrium: The atrium was mildly dilated.  Myocardial perfusion study 06-21-2017: Study Highlights     Nuclear stress EF: 55%.  Blood pressure demonstrated a normal response to exercise.  There was no ST segment deviation noted during stress.  The study is normal.  This is a low risk study.  The left ventricular ejection fraction is normal (55-65%).  Normal resting and stress perfusion. No ischemia or infarction EF 55%   Labs/Other Tests and Data Reviewed:    EKG:  An ECG dated 03-06-2018 was personally reviewed today and demonstrated:  NSR with PAC's, no ST-T changes  Recent Labs: 02/05/2018: ALT  30 07/14/2018: BUN 8; Creatinine, Ser 0.82; Magnesium 1.7; Potassium 3.9; Sodium 134 07/17/2018: Hemoglobin 10.1; Platelets 133   Recent Lipid Panel No results found for: CHOL, TRIG, HDL, CHOLHDL, LDLCALC, LDLDIRECT  Wt Readings from Last 3 Encounters:  10/26/18 152 lb (68.9 kg)  10/16/18 152 lb (68.9 kg)  07/19/18 148 lb 0.6 oz (67.1 kg)     Objective:    Vital Signs:  BP 118/68 (BP Location: Right Arm, Patient Position: Sitting, Cuff Size: Normal)   Pulse 72   Temp 98.7 F (37.1 C)   Ht 5\' 4"  (1.626 m)   Wt 152 lb (68.9 kg)   BMI 26.09 kg/m    VITAL SIGNS:  reviewed Alert, oriented, in NAD.   ASSESSMENT & PLAN:    1. Peripheral arterial disease with intermittent claudication: Appears there are no further revascularization options.  Followed closely by Dr. Fletcher Anon and Dr. Oneida Alar.  Treated with antiplatelet therapy and a statin drug. 2. Hypertension: Blood pressure well controlled. 3. Coronary artery disease, native vessel, without angina: The patient underwent remote CABG.  He appears to be doing well with normal LV function and no symptoms of angina.  His medical program is reviewed and no changes are made today. 4. Mixed hyperlipidemia: Treated with a  statin drug, lipids followed by primary care.  He had recent labs drawn.  COVID-19 Education: The signs and symptoms of COVID-19 were discussed with the patient and how to seek care for testing (follow up with PCP or arrange E-visit).  The importance of social distancing was discussed today.  Time:   Today, I have spent 14 minutes with the patient with telehealth technology discussing the above problems.     Medication Adjustments/Labs and Tests Ordered: Current medicines are reviewed at length with the patient today.  Concerns regarding medicines are outlined above.   Tests Ordered: No orders of the defined types were placed in this encounter.   Medication Changes: No orders of the defined types were placed in this encounter.   Disposition:  Follow up in 1 year(s)  Signed, Sherren Mocha, MD  10/26/2018 8:44 AM    [place]

## 2018-11-21 ENCOUNTER — Ambulatory Visit: Payer: Medicare Other | Admitting: Cardiovascular Disease

## 2019-01-01 ENCOUNTER — Other Ambulatory Visit: Payer: Self-pay

## 2019-01-01 DIAGNOSIS — I739 Peripheral vascular disease, unspecified: Secondary | ICD-10-CM

## 2019-01-02 ENCOUNTER — Telehealth (HOSPITAL_COMMUNITY): Payer: Self-pay | Admitting: Rehabilitation

## 2019-01-02 NOTE — Telephone Encounter (Signed)

## 2019-01-03 ENCOUNTER — Ambulatory Visit (INDEPENDENT_AMBULATORY_CARE_PROVIDER_SITE_OTHER): Payer: Medicare Other | Admitting: Vascular Surgery

## 2019-01-03 ENCOUNTER — Ambulatory Visit (INDEPENDENT_AMBULATORY_CARE_PROVIDER_SITE_OTHER)
Admission: RE | Admit: 2019-01-03 | Discharge: 2019-01-03 | Disposition: A | Discharge status: Home / Self Care | Payer: Medicare Other | Source: Ambulatory Visit | Attending: Family | Admitting: Family

## 2019-01-03 ENCOUNTER — Encounter: Payer: Self-pay | Admitting: Vascular Surgery

## 2019-01-03 ENCOUNTER — Other Ambulatory Visit: Payer: Self-pay

## 2019-01-03 ENCOUNTER — Ambulatory Visit (HOSPITAL_COMMUNITY)
Admission: RE | Admit: 2019-01-03 | Discharge: 2019-01-03 | Disposition: A | Discharge status: Home / Self Care | Payer: Medicare Other | Source: Ambulatory Visit | Attending: Family | Admitting: Family

## 2019-01-03 VITALS — BP 139/73 | HR 63 | Temp 97.6°F | Resp 20 | Ht 64.0 in | Wt 159.0 lb

## 2019-01-03 DIAGNOSIS — I739 Peripheral vascular disease, unspecified: Secondary | ICD-10-CM

## 2019-01-03 NOTE — Progress Notes (Signed)
Patient is a 73 year old male who returns for follow-up today.  He is undergone multiple prior bypass procedures and revisions in the right leg and failed thrombolysis a few months ago.  We are not considering any other further revascularizations in the right leg.  However, he has done fairly well.  He has short distance claudication at about 1-2 blocks in the right leg.  He has no rest pain.  He actually did heal up a blister on his right foot recently.  He is not smoking.  As far as his left leg is concerned he is also had a left femoral to below-knee popliteal bypass with vein in September 2019.  He has no left leg symptoms.  Chronic medical problems include fairly significant COPD, coronary artery disease, remote history of stroke.  These of all been stable.  Past Medical History:  Diagnosis Date  . Arthritis   . Asthma   . Carpal tunnel syndrome   . Chest pain, unspecified   . Chronic kidney disease    hx of kidney stones  . Coronary artery disease   . Coronary atherosclerosis of artery bypass graft   . GERD (gastroesophageal reflux disease)   . History of kidney stones   . HOH (hard of hearing)   . Hypertension   . 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 Other and unspecified hyperlipidemia   . Personal history of unspecified circulatory disease   . Shortness of breath   . Stroke ([place])   . TIA (transient ischemic attack)    Past Surgical History:  Procedure Laterality Date  . ABDOMINAL AORTOGRAM N/A 06/14/2017   Procedure: ABDOMINAL AORTOGRAM;  Surgeon: Wellington Hampshire, MD;  Location: [place] [facility];  Service: Cardiovascular;  Laterality: N/A;  . ABDOMINAL AORTOGRAM N/A 01/26/2018   Procedure: ABDOMINAL AORTOGRAM;  Surgeon: Elam Dutch, MD;  Location: [place] [facility];  Service: Cardiovascular;  Laterality: N/A;  . ABDOMINAL AORTOGRAM N/A 07/11/2018   Procedure: ABDOMINAL AORTOGRAM;  Surgeon: Marty Heck, MD;   Location: [place] [facility];  Service: Cardiovascular;  Laterality: N/A;  . BACK SURGERY     cervical disc  . CARDIAC CATHETERIZATION    . CARPAL TUNNEL RELEASE     blateral carpal tunnel  . CORONARY ARTERY BYPASS GRAFT     x5 12 yrs ago  . ENDARTERECTOMY FEMORAL Right 07/03/2017   Procedure: ENDARTERECTOMY FEMORAL RIGHT;  Surgeon: Elam Dutch, MD;  Location: [place];  Service: Vascular;  Laterality: Right;  . FEMORAL-POPLITEAL BYPASS GRAFT Right 07/03/2017   Procedure: BYPASS GRAFT FEMORAL-POPLITEAL ARTERY;  Surgeon: Elam Dutch, MD;  Location: [place];  Service: Vascular;  Laterality: Right;  . FEMORAL-POPLITEAL BYPASS GRAFT Left 02/12/2018   Procedure: BYPASS GRAFT FEMORAL-POPLITEAL ARTERY LEFT LEG;  Surgeon: Elam Dutch, MD;  Location: [place];  Service: Vascular;  Laterality: Left;  . LOWER EXTREMITY ANGIOGRAM Bilateral 08/05/2015   Procedure: Lower Extremity Angiogram;  Surgeon: Wellington Hampshire, MD;  Location: [place] [facility];  Service: Cardiovascular;  Laterality: Bilateral;  . LOWER EXTREMITY ANGIOGRAPHY Bilateral 06/14/2017   Procedure: Lower Extremity Angiography;  Surgeon: Wellington Hampshire, MD;  Location: [place] [facility];  Service: Cardiovascular;  Laterality: Bilateral;  . LOWER EXTREMITY ANGIOGRAPHY Bilateral 01/26/2018   Procedure: LOWER EXTREMITY ANGIOGRAPHY;  Surgeon: Elam Dutch, MD;  Location: [place] [facility];  Service: Cardiovascular;  Laterality: Bilateral;  . LOWER EXTREMITY ANGIOGRAPHY Right 07/11/2018  Procedure: LOWER EXTREMITY ANGIOGRAPHY;  Surgeon: Marty Heck, MD;  Location: [place] [facility];  Service: Cardiovascular;  Laterality: Right;  thrombolysis fem/tib bypass graft  . NECK SURGERY    . PATCH ANGIOPLASTY Right 07/03/2017   Procedure: VEIN PATCH ANGIOPLASTY OF FEMORAL AND POSTERIOR TIBIAL ARTERIES;  Surgeon: Elam Dutch, MD;  Location: [place];  Service: Vascular;  Laterality: Right;  . PERIPHERAL VASCULAR BALLOON ANGIOPLASTY  Right 07/12/2018   Procedure: PERIPHERAL VASCULAR BALLOON ANGIOPLASTY;  Surgeon: Marty Heck, MD;  Location: [place] [facility];  Service: Cardiovascular;  Laterality: Right;  Right fem posterior tibial graft and posterior tibial artery.  Marland Kitchen PERIPHERAL VASCULAR BALLOON ANGIOPLASTY Right 07/13/2018   Procedure: PERIPHERAL VASCULAR BALLOON ANGIOPLASTY;  Surgeon: Elam Dutch, MD;  Location: [place] [facility];  Service: Cardiovascular;  Laterality: Right;  PT  . PERIPHERAL VASCULAR CATHETERIZATION N/A 08/05/2015   Procedure: Abdominal Aortogram;  Surgeon: Wellington Hampshire, MD;  Location: [place] [facility];  Service: Cardiovascular;  Laterality: N/A;  . PERIPHERAL VASCULAR THROMBECTOMY N/A 07/12/2018   Procedure: PERIPHERAL VASCULAR THROMBECTOMY - LYSIS RECHECK;  Surgeon: Marty Heck, MD;  Location: [place] [facility];  Service: Cardiovascular;  Laterality: N/A;  . PERIPHERAL VASCULAR THROMBECTOMY Right 07/13/2018   Procedure: PERIPHERAL VASCULAR THROMBECTOMY;  Surgeon: Elam Dutch, MD;  Location: [place] [facility];  Service: Cardiovascular;  Laterality: Right;  . SHOULDER ARTHROSCOPY W/ ROTATOR CUFF REPAIR     Right  . VEIN HARVEST Left 02/12/2018   Procedure: VEIN HARVEST;  Surgeon: Elam Dutch, MD;  Location: [facility];  Service: Vascular;  Laterality: Left;   Review of systems: He has shortness of breath with minimal exertion.  He denies chest pain.  Physical exam:  Vitals:   01/03/19 1234  BP: 139/73  Pulse: 63  Resp: 20  Temp: 97.6 F (36.4 C)  TempSrc: Temporal  SpO2: 98%  Weight: 159 lb (72.1 kg)  Height: 5\' 4"  (1.626 m)    Extremities: 2+ femoral pulses bilaterally absent popliteal and pedal pulses  Skin: Right foot slightly duskier than the left temperature of the right foot is slightly cooler than the left  Data: Patient had bilateral ABIs today which were 0.42 on the right 1.06 and triphasic on the left.  He had a duplex ultrasound of his left leg  bypass today which showed no evidence of narrowing.  Assessment: Doing well status post left femoral to below-knee popliteal bypass with patent graft.  Additionally although he is not a revascularization candidate on the right lower extremity he currently has minimal symptoms on that side other than short distance claudication.  Patient was advised in how to protect his right foot.  Plan: The patient will follow-up in 1 year with a graft duplex scan of the left leg and bilateral ABIs.  He will return sooner if he develops any nonhealing wounds on his feet.  [name], MD [facility] Office: [phone] Pager: [phone]

## 2019-02-14 ENCOUNTER — Telehealth: Payer: Self-pay | Admitting: Cardiovascular Disease

## 2019-02-14 DIAGNOSIS — H5010 Unspecified exotropia: Secondary | ICD-10-CM | POA: Diagnosis not present

## 2019-02-14 DIAGNOSIS — H53022 Refractive amblyopia, left eye: Secondary | ICD-10-CM | POA: Diagnosis not present

## 2019-02-14 DIAGNOSIS — H2513 Age-related nuclear cataract, bilateral: Secondary | ICD-10-CM | POA: Diagnosis not present

## 2019-02-14 DIAGNOSIS — H40053 Ocular hypertension, bilateral: Secondary | ICD-10-CM | POA: Diagnosis not present

## 2019-02-14 NOTE — Telephone Encounter (Signed)
New message:    Patient calling because he would like a Virtual Visit with the doctor if his wife can not come please call the patient back.

## 2019-02-14 NOTE — Telephone Encounter (Signed)
Per pt's wife pt suffers from COPD and does not want to wear a mask  Also has trouble hearing .Appt changed to video visit at pt's request ./cy

## 2019-02-19 ENCOUNTER — Encounter: Payer: Self-pay | Admitting: Cardiovascular Disease

## 2019-02-19 ENCOUNTER — Telehealth (INDEPENDENT_AMBULATORY_CARE_PROVIDER_SITE_OTHER): Payer: Medicare Other | Admitting: Cardiovascular Disease

## 2019-02-19 VITALS — BP 115/63 | HR 77 | Ht 64.0 in | Wt 150.0 lb

## 2019-02-19 DIAGNOSIS — I10 Essential (primary) hypertension: Secondary | ICD-10-CM

## 2019-02-19 DIAGNOSIS — I739 Peripheral vascular disease, unspecified: Secondary | ICD-10-CM | POA: Diagnosis not present

## 2019-02-19 DIAGNOSIS — E785 Hyperlipidemia, unspecified: Secondary | ICD-10-CM

## 2019-02-19 NOTE — Patient Instructions (Signed)
Medication Instructions:  Your physician recommends that you continue on your current medications as directed. Please refer to the Current Medication list given to you today.  If you need a refill on your cardiac medications before your next appointment, please call your pharmacy.   Lab work: None ordered If you have labs (blood work) drawn today and your tests are completely normal, you will receive your results only by: MyChart Message (if you have MyChart) OR A paper copy in the mail If you have any lab test that is abnormal or we need to change your treatment, we will call you to review the results.  Testing/Procedures: None ordered  Follow-Up: At [facility], you and your health needs are our priority.  As part of our continuing mission to provide you with exceptional heart care, we have created designated Provider Care Teams.  These Care Teams include your primary Cardiologist (physician) and Advanced Practice Providers (APPs -  Physician Assistants and Nurse Practitioners) who all work together to provide you with the care you need, when you need it. You will need a follow up appointment in 6 months.  Please call our office 2 months in advance to schedule this appointment.  You may see Muhammad Arida, MD or one of the following Advanced Practice Providers on your designated Care Team:   Luke Kilroy, PA-C Krista Kroeger, PA-C Callie Goodrich, PA-C      

## 2019-02-19 NOTE — Progress Notes (Signed)
Virtual Visit via Video Note   This visit type was conducted due to national recommendations for restrictions regarding the COVID-19 Pandemic (e.g. social distancing) in an effort to limit this patient's exposure and mitigate transmission in our community.  Due to his co-morbid illnesses, this patient is at least at moderate risk for complications without adequate follow up.  This format is felt to be most appropriate for this patient at this time.  All issues noted in this document were discussed and addressed.  A limited physical exam was performed with this format.  Please refer to the patient's chart for his consent to telehealth for [facility].   Date:  02/19/2019   ID:  Devin [name], DOB 01-02-1946, MRN [id number]  Patient Location: Home Provider Location: Office  PCP:  [name], MD  Cardiologist:  Dr. Burt Knack Electrophysiologist:  None   Evaluation Performed:  Follow-Up Visit  Chief Complaint: No complaints today.  History of Present Illness:    Devin [name] is a 73 y.o. male who was seen via video visit for follow-up regarding peripheral arterial disease. He has known history of CAD s/p CABG, cardiac cath in 2007 showed patent grafts, hyperlipidemia and tobacco use. He is not diabetic.  He is status post bilateral femoropopliteal bypass.  Unfortunately, the right femoropopliteal bypass occluded earlier this year and could not be salvaged.  Medical therapy was continued.  In spite of that, the patient denies any ulceration or gangrene.  He had vascular studies done in August with [facility] which showed normal ABI on the left and stable on the right at 0.42.  Duplex showed known occluded right femoral-popliteal bypass and patent femoral-popliteal bypass on the left. He has been doing well overall with no recent chest pain or shortness of breath.  He reports stable right leg claudication without rest pain or lower extremity ulceration.  He is somewhat limited by calf  claudication but is able to do his yard work.  The patient does not have symptoms concerning for COVID-19 infection (fever, chills, cough, or new shortness of breath).    Past Medical History:  Diagnosis Date  . Arthritis   . Asthma   . Carpal tunnel syndrome   . Chest pain, unspecified   . Chronic kidney disease    hx of kidney stones  . Coronary artery disease   . Coronary atherosclerosis of artery bypass graft   . GERD (gastroesophageal reflux disease)   . History of kidney stones   . HOH (hard of hearing)   . Hypertension   . 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 Other and unspecified hyperlipidemia   . Personal history of unspecified circulatory disease   . Shortness of breath   . Stroke ([place])   . TIA (transient ischemic attack)    Past Surgical History:  Procedure Laterality Date  . ABDOMINAL AORTOGRAM N/A 06/14/2017   Procedure: ABDOMINAL AORTOGRAM;  Surgeon: Wellington Hampshire, MD;  Location: [place] [facility];  Service: Cardiovascular;  Laterality: N/A;  . ABDOMINAL AORTOGRAM N/A 01/26/2018   Procedure: ABDOMINAL AORTOGRAM;  Surgeon: Elam Dutch, MD;  Location: [place] [facility];  Service: Cardiovascular;  Laterality: N/A;  . ABDOMINAL AORTOGRAM N/A 07/11/2018   Procedure: ABDOMINAL AORTOGRAM;  Surgeon: Marty Heck, MD;  Location: [place] [facility];  Service: Cardiovascular;  Laterality: N/A;  . BACK SURGERY     cervical disc  . CARDIAC CATHETERIZATION    . CARPAL  TUNNEL RELEASE     blateral carpal tunnel  . CORONARY ARTERY BYPASS GRAFT     x5 12 yrs ago  . ENDARTERECTOMY FEMORAL Right 07/03/2017   Procedure: ENDARTERECTOMY FEMORAL RIGHT;  Surgeon: Elam Dutch, MD;  Location: [place];  Service: Vascular;  Laterality: Right;  . FEMORAL-POPLITEAL BYPASS GRAFT Right 07/03/2017   Procedure: BYPASS GRAFT FEMORAL-POPLITEAL ARTERY;  Surgeon: Elam Dutch, MD;  Location: [place];  Service: Vascular;   Laterality: Right;  . FEMORAL-POPLITEAL BYPASS GRAFT Left 02/12/2018   Procedure: BYPASS GRAFT FEMORAL-POPLITEAL ARTERY LEFT LEG;  Surgeon: Elam Dutch, MD;  Location: [place];  Service: Vascular;  Laterality: Left;  . LOWER EXTREMITY ANGIOGRAM Bilateral 08/05/2015   Procedure: Lower Extremity Angiogram;  Surgeon: Wellington Hampshire, MD;  Location: [place] [facility];  Service: Cardiovascular;  Laterality: Bilateral;  . LOWER EXTREMITY ANGIOGRAPHY Bilateral 06/14/2017   Procedure: Lower Extremity Angiography;  Surgeon: Wellington Hampshire, MD;  Location: [place] [facility];  Service: Cardiovascular;  Laterality: Bilateral;  . LOWER EXTREMITY ANGIOGRAPHY Bilateral 01/26/2018   Procedure: LOWER EXTREMITY ANGIOGRAPHY;  Surgeon: Elam Dutch, MD;  Location: [place] [facility];  Service: Cardiovascular;  Laterality: Bilateral;  . LOWER EXTREMITY ANGIOGRAPHY Right 07/11/2018   Procedure: LOWER EXTREMITY ANGIOGRAPHY;  Surgeon: Marty Heck, MD;  Location: [place] [facility];  Service: Cardiovascular;  Laterality: Right;  thrombolysis fem/tib bypass graft  . NECK SURGERY    . PATCH ANGIOPLASTY Right 07/03/2017   Procedure: VEIN PATCH ANGIOPLASTY OF FEMORAL AND POSTERIOR TIBIAL ARTERIES;  Surgeon: Elam Dutch, MD;  Location: [place];  Service: Vascular;  Laterality: Right;  . PERIPHERAL VASCULAR BALLOON ANGIOPLASTY Right 07/12/2018   Procedure: PERIPHERAL VASCULAR BALLOON ANGIOPLASTY;  Surgeon: Marty Heck, MD;  Location: [place] [facility];  Service: Cardiovascular;  Laterality: Right;  Right fem posterior tibial graft and posterior tibial artery.  Marland Kitchen PERIPHERAL VASCULAR BALLOON ANGIOPLASTY Right 07/13/2018   Procedure: PERIPHERAL VASCULAR BALLOON ANGIOPLASTY;  Surgeon: Elam Dutch, MD;  Location: [place] [facility];  Service: Cardiovascular;  Laterality: Right;  PT  . PERIPHERAL VASCULAR CATHETERIZATION N/A 08/05/2015   Procedure: Abdominal Aortogram;  Surgeon: Wellington Hampshire, MD;   Location: [place] [facility];  Service: Cardiovascular;  Laterality: N/A;  . PERIPHERAL VASCULAR THROMBECTOMY N/A 07/12/2018   Procedure: PERIPHERAL VASCULAR THROMBECTOMY - LYSIS RECHECK;  Surgeon: Marty Heck, MD;  Location: [place] [facility];  Service: Cardiovascular;  Laterality: N/A;  . PERIPHERAL VASCULAR THROMBECTOMY Right 07/13/2018   Procedure: PERIPHERAL VASCULAR THROMBECTOMY;  Surgeon: Elam Dutch, MD;  Location: [place] [facility];  Service: Cardiovascular;  Laterality: Right;  . SHOULDER ARTHROSCOPY W/ ROTATOR CUFF REPAIR     Right  . VEIN HARVEST Left 02/12/2018   Procedure: VEIN HARVEST;  Surgeon: Elam Dutch, MD;  Location[facility];  Service: Vascular;  Laterality: Left;     Current Meds  Medication Sig  . albuterol (PROVENTIL HFA;VENTOLIN HFA) 108 (90 Base) MCG/ACT inhaler Inhale 2 puffs into the lungs every 6 (six) hours as needed for wheezing or shortness of breath.  Marland Kitchen amLODipine (NORVASC) 5 MG tablet Take 1 tablet (5 mg total) by mouth daily.  Marland Kitchen aspirin (ASPIRIN LOW DOSE) 81 MG EC tablet Take 81 mg by mouth daily.    Marland Kitchen atorvastatin (LIPITOR) 40 MG tablet Take 1 tablet (40 mg total) by mouth daily.  . calcium carbonate (OSCAL) 1500 (600 Ca) MG TABS tablet Take 600 mg of  elemental calcium by mouth daily with breakfast.  . Cholecalciferol 5000 units TABS Take 5,000 Units by mouth daily.  . cilostazol (PLETAL) 100 MG tablet Take 1 tablet (100 mg total) by mouth 2 (two) times daily.  . dorzolamide-timolol (COSOPT) 22.3-6.8 MG/ML ophthalmic solution Place 1 drop into the right eye 2 (two) times daily.   . IRON PO Take 1 tablet by mouth daily.  Marland Kitchen latanoprost (XALATAN) 0.005 % ophthalmic solution 1 drop at bedtime.  . nitroGLYCERIN (NITROSTAT) 0.4 MG SL tablet Place 1 tablet (0.4 mg total) under the tongue every 5 (five) minutes as needed for chest pain.  Marland Kitchen omeprazole (PRILOSEC OTC) 20 MG tablet Take 20 mg by mouth daily.    . SYMBICORT 80-4.5 MCG/ACT inhaler  Inhale 2 puffs into the lungs 2 (two) times daily.   . vitamin B-12 (CYANOCOBALAMIN) 1000 MCG tablet Take 1,000 mcg by mouth daily.  Marland Kitchen zinc gluconate 50 MG tablet Take 50 mg by mouth daily.     Allergies:   Aspirin   Social History   Tobacco Use  . Smoking status: Former Smoker    Packs/day: 1.00    Years: 50.00    Pack years: 50.00    Types: Cigarettes, Cigars    Quit date: 06/07/2017    Years since quitting: 1.7  . Smokeless tobacco: Never Used  Substance Use Topics  . Alcohol use: No    Alcohol/week: 0.0 standard drinks  . Drug use: No     Family Hx: The patient's family history includes Arthritis in an other family member; Asthma in an other family member; Coronary artery disease in an other family member; Heart disease in an other family member. There is no history of Anesthesia problems, Hypotension, Malignant hyperthermia, or Pseudochol deficiency.  ROS:   Please see the history of present illness.     All other systems reviewed and are negative.   Prior CV studies:   The following studies were reviewed today:  Non  Labs/Other Tests and Data Reviewed:    EKG:  No ECG reviewed.  Recent Labs: 07/14/2018: BUN 8; Creatinine, Ser 0.82; Magnesium 1.7; Potassium 3.9; Sodium 134 07/17/2018: Hemoglobin 10.1; Platelets 133   Recent Lipid Panel No results found for: CHOL, TRIG, HDL, CHOLHDL, LDLCALC, LDLDIRECT  Wt Readings from Last 3 Encounters:  02/19/19 150 lb (68 kg)  01/03/19 159 lb (72.1 kg)  10/26/18 152 lb (68.9 kg)     Objective:    Vital Signs:  BP 115/63   Pulse 77   Ht 5\' 4"  (1.626 m)   Wt 150 lb (68 kg)   BMI 25.75 kg/m    VITAL SIGNS:  reviewed GEN:  no acute distress EYES:  sclerae anicteric, EOMI - Extraocular Movements Intact RESPIRATORY:  normal respiratory effort, symmetric expansion SKIN:  no rash, lesions or ulcers. MUSCULOSKELETAL:  no obvious deformities. NEURO:  alert and oriented x 3, no obvious focal deficit PSYCH:  normal  affect  ASSESSMENT & PLAN:    1. PAD : Status post bilateral femoral popliteal bypass but with occluded bypass on the right side.  At the present time, he has no severe claudication or evidence of critical limb ischemia.  Continue medical therapy.  2. CAD status post CABG: The patient is stable and has no symptoms of angina. Continue aspirin daily.   3. Hyperlipidemia: Continue Atorvastatin with a target LDL <70.   4. Tobacco abuse: He quit smoking in 2019.    5.  Essential hypertension: Blood pressure is controlled  on current medications.    COVID-19 Education: The signs and symptoms of COVID-19 were discussed with the patient and how to seek care for testing (follow up with PCP or arrange E-visit).  The importance of social distancing was discussed today.  Time:   Today, I have spent 8 minutes with the patient with telehealth technology discussing the above problems.     Medication Adjustments/Labs and Tests Ordered: Current medicines are reviewed at length with the patient today.  Concerns regarding medicines are outlined above.   Tests Ordered: No orders of the defined types were placed in this encounter.   Medication Changes: No orders of the defined types were placed in this encounter.   Disposition:  Follow up in 4 month(s)  Signed, [name], MD  02/19/2019 10:02 AM    [place]

## 2019-05-03 ENCOUNTER — Encounter: Payer: Self-pay | Admitting: Cardiovascular Disease

## 2019-05-03 DIAGNOSIS — I10 Essential (primary) hypertension: Secondary | ICD-10-CM | POA: Diagnosis not present

## 2019-05-03 DIAGNOSIS — Z79899 Other long term (current) drug therapy: Secondary | ICD-10-CM | POA: Diagnosis not present

## 2019-05-03 DIAGNOSIS — R7303 Prediabetes: Secondary | ICD-10-CM | POA: Diagnosis not present

## 2019-05-03 DIAGNOSIS — D509 Iron deficiency anemia, unspecified: Secondary | ICD-10-CM | POA: Diagnosis not present

## 2019-05-03 DIAGNOSIS — E785 Hyperlipidemia, unspecified: Secondary | ICD-10-CM | POA: Diagnosis not present

## 2019-05-09 DIAGNOSIS — I2510 Atherosclerotic heart disease of native coronary artery without angina pectoris: Secondary | ICD-10-CM | POA: Diagnosis not present

## 2019-05-09 DIAGNOSIS — Z0001 Encounter for general adult medical examination with abnormal findings: Secondary | ICD-10-CM | POA: Diagnosis not present

## 2019-05-09 DIAGNOSIS — E785 Hyperlipidemia, unspecified: Secondary | ICD-10-CM | POA: Diagnosis not present

## 2019-05-09 DIAGNOSIS — D509 Iron deficiency anemia, unspecified: Secondary | ICD-10-CM | POA: Diagnosis not present

## 2019-05-09 DIAGNOSIS — I10 Essential (primary) hypertension: Secondary | ICD-10-CM | POA: Diagnosis not present

## 2019-05-14 ENCOUNTER — Other Ambulatory Visit: Payer: Self-pay

## 2019-05-14 MED ORDER — CILOSTAZOL 100 MG PO TABS: 100.0000 mg | ORAL_TABLET | Freq: Two times a day (BID) | ORAL | 2 refills | Status: DC

## 2019-07-08 ENCOUNTER — Other Ambulatory Visit: Payer: Self-pay | Admitting: Cardiovascular Disease

## 2019-08-14 DIAGNOSIS — H5010 Unspecified exotropia: Secondary | ICD-10-CM | POA: Diagnosis not present

## 2019-08-14 DIAGNOSIS — H40053 Ocular hypertension, bilateral: Secondary | ICD-10-CM | POA: Diagnosis not present

## 2019-08-14 DIAGNOSIS — H2513 Age-related nuclear cataract, bilateral: Secondary | ICD-10-CM | POA: Diagnosis not present

## 2019-08-14 DIAGNOSIS — H53022 Refractive amblyopia, left eye: Secondary | ICD-10-CM | POA: Diagnosis not present

## 2019-08-27 ENCOUNTER — Encounter: Payer: Self-pay | Admitting: *Deleted

## 2019-08-29 ENCOUNTER — Telehealth: Payer: Self-pay | Admitting: Cardiovascular Disease

## 2019-08-29 NOTE — Telephone Encounter (Signed)
Patient stated that he would call back to schedule 6 month follow up appointment with Dr. Fletcher Anon.

## 2019-09-12 DIAGNOSIS — H2512 Age-related nuclear cataract, left eye: Secondary | ICD-10-CM | POA: Diagnosis not present

## 2019-09-27 ENCOUNTER — Telehealth: Payer: Self-pay | Admitting: Cardiovascular Disease

## 2019-09-27 NOTE — Telephone Encounter (Signed)
The wife has been made aware that this is fine. Noted in the chart.

## 2019-09-27 NOTE — Telephone Encounter (Signed)
Devin [name] is calling requesting she attend Devin [name]'s upcoming appointment scheduled for 10/01/19 due to him being hard of hearing and having carpal tunnel in his hands to the point Devin [name] has to do any witting for him. Please advise.

## 2019-10-01 ENCOUNTER — Ambulatory Visit: Payer: Medicare Other | Admitting: Cardiovascular Disease

## 2019-10-01 DIAGNOSIS — H2511 Age-related nuclear cataract, right eye: Secondary | ICD-10-CM | POA: Diagnosis not present

## 2019-10-03 DIAGNOSIS — H2511 Age-related nuclear cataract, right eye: Secondary | ICD-10-CM | POA: Diagnosis not present

## 2019-10-08 ENCOUNTER — Ambulatory Visit: Payer: Medicare Other | Admitting: Cardiovascular Disease

## 2019-10-08 ENCOUNTER — Other Ambulatory Visit: Payer: Self-pay

## 2019-10-08 ENCOUNTER — Ambulatory Visit: Payer: Medicare Other

## 2019-10-08 ENCOUNTER — Encounter: Payer: Self-pay | Admitting: Cardiovascular Disease

## 2019-10-08 VITALS — BP 142/80 | HR 60 | Ht 64.0 in | Wt 159.0 lb

## 2019-10-08 DIAGNOSIS — I739 Peripheral vascular disease, unspecified: Secondary | ICD-10-CM | POA: Diagnosis not present

## 2019-10-08 DIAGNOSIS — I2510 Atherosclerotic heart disease of native coronary artery without angina pectoris: Secondary | ICD-10-CM

## 2019-10-08 DIAGNOSIS — E785 Hyperlipidemia, unspecified: Secondary | ICD-10-CM | POA: Diagnosis not present

## 2019-10-08 DIAGNOSIS — I10 Essential (primary) hypertension: Secondary | ICD-10-CM

## 2019-10-08 NOTE — Progress Notes (Addendum)
Cardiology Office Note   Date:  10/08/2019   ID:  Devin [name], DOB 07/19/1945, MRN [id number]  PCP:  Devin Gravel, MD  Cardiologist:  Dr. Burt [name]  No chief complaint on file.     History of Present Illness: Devin [name] is a 74 y.o. male who presents for  a follow-up visit  regarding peripheral arterial disease. He has known history of CAD s/p CABG, cardiac cath in 2007 showed patent grafts, hyperlipidemia and tobacco use. He is not diabetic.  He is status post bilateral femoropopliteal bypass.  Unfortunately, the right femoropopliteal bypass occluded earlier in 2020 and could not be salvaged.  Medical therapy was continued.  In spite of that, the patient denies any ulceration or gangrene.  He had vascular studies done in August ,2020 which showed normal ABI on the left and stable on the right at 0.42.  Duplex showed known occluded right femoral-popliteal bypass and patent femoral-popliteal bypass on the left. He has been doing well overall with no recent chest pain or shortness of breath.  He reports minimal bilateral leg pain with walking.  He tries to walk on a regular basis for exercise.  He took the COVID-19 vaccine.   Past Medical History:  Diagnosis Date  . Arthritis   . Asthma   . Carpal tunnel syndrome   . Chest pain, unspecified   . Chronic kidney disease    hx of kidney stones  . Coronary artery disease   . Coronary atherosclerosis of artery bypass graft   . GERD (gastroesophageal reflux disease)   . History of kidney stones   . HOH (hard of hearing)   . Hypertension   . 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 Other and unspecified hyperlipidemia   . Personal history of unspecified circulatory disease   . Shortness of breath   . Stroke ([place])   . TIA (transient ischemic attack)     Past Surgical History:  Procedure Laterality Date  . ABDOMINAL AORTOGRAM N/A 06/14/2017   Procedure: ABDOMINAL AORTOGRAM;   Surgeon: Wellington Hampshire, MD;  Location: [place] [facility];  Service: Cardiovascular;  Laterality: N/A;  . ABDOMINAL AORTOGRAM N/A 01/26/2018   Procedure: ABDOMINAL AORTOGRAM;  Surgeon: Elam Dutch, MD;  Location: [place] [facility];  Service: Cardiovascular;  Laterality: N/A;  . ABDOMINAL AORTOGRAM N/A 07/11/2018   Procedure: ABDOMINAL AORTOGRAM;  Surgeon: Marty Heck, MD;  Location: [place] [facility];  Service: Cardiovascular;  Laterality: N/A;  . BACK SURGERY     cervical disc  . CARDIAC CATHETERIZATION    . CARPAL TUNNEL RELEASE     blateral carpal tunnel  . CORONARY ARTERY BYPASS GRAFT     x5 12 yrs ago  . ENDARTERECTOMY FEMORAL Right 07/03/2017   Procedure: ENDARTERECTOMY FEMORAL RIGHT;  Surgeon: Elam Dutch, MD;  Location: [place];  Service: Vascular;  Laterality: Right;  . FEMORAL-POPLITEAL BYPASS GRAFT Right 07/03/2017   Procedure: BYPASS GRAFT FEMORAL-POPLITEAL ARTERY;  Surgeon: Elam Dutch, MD;  Location: [place];  Service: Vascular;  Laterality: Right;  . FEMORAL-POPLITEAL BYPASS GRAFT Left 02/12/2018   Procedure: BYPASS GRAFT FEMORAL-POPLITEAL ARTERY LEFT LEG;  Surgeon: Elam Dutch, MD;  Location: [place];  Service: Vascular;  Laterality: Left;  . LOWER EXTREMITY ANGIOGRAM Bilateral 08/05/2015   Procedure: Lower Extremity Angiogram;  Surgeon: Wellington Hampshire, MD;  Location: [place] [facility];  Service: Cardiovascular;  Laterality: Bilateral;  . LOWER  EXTREMITY ANGIOGRAPHY Bilateral 06/14/2017   Procedure: Lower Extremity Angiography;  Surgeon: Wellington Hampshire, MD;  Location: [place] [facility];  Service: Cardiovascular;  Laterality: Bilateral;  . LOWER EXTREMITY ANGIOGRAPHY Bilateral 01/26/2018   Procedure: LOWER EXTREMITY ANGIOGRAPHY;  Surgeon: Elam Dutch, MD;  Location: [place] [facility];  Service: Cardiovascular;  Laterality: Bilateral;  . LOWER EXTREMITY ANGIOGRAPHY Right 07/11/2018   Procedure: LOWER EXTREMITY ANGIOGRAPHY;  Surgeon: Marty Heck, MD;  Location: [place] [facility];  Service: Cardiovascular;  Laterality: Right;  thrombolysis fem/tib bypass graft  . NECK SURGERY    . PATCH ANGIOPLASTY Right 07/03/2017   Procedure: VEIN PATCH ANGIOPLASTY OF FEMORAL AND POSTERIOR TIBIAL ARTERIES;  Surgeon: Elam Dutch, MD;  Location: [place];  Service: Vascular;  Laterality: Right;  . PERIPHERAL VASCULAR BALLOON ANGIOPLASTY Right 07/12/2018   Procedure: PERIPHERAL VASCULAR BALLOON ANGIOPLASTY;  Surgeon: Marty Heck, MD;  Location: [place] [facility];  Service: Cardiovascular;  Laterality: Right;  Right fem posterior tibial graft and posterior tibial artery.  Marland Kitchen PERIPHERAL VASCULAR BALLOON ANGIOPLASTY Right 07/13/2018   Procedure: PERIPHERAL VASCULAR BALLOON ANGIOPLASTY;  Surgeon: Elam Dutch, MD;  Location: [place] [facility];  Service: Cardiovascular;  Laterality: Right;  PT  . PERIPHERAL VASCULAR CATHETERIZATION N/A 08/05/2015   Procedure: Abdominal Aortogram;  Surgeon: Wellington Hampshire, MD;  Location: [place] [facility];  Service: Cardiovascular;  Laterality: N/A;  . PERIPHERAL VASCULAR THROMBECTOMY N/A 07/12/2018   Procedure: PERIPHERAL VASCULAR THROMBECTOMY - LYSIS RECHECK;  Surgeon: Marty Heck, MD;  Location: [place] [facility];  Service: Cardiovascular;  Laterality: N/A;  . PERIPHERAL VASCULAR THROMBECTOMY Right 07/13/2018   Procedure: PERIPHERAL VASCULAR THROMBECTOMY;  Surgeon: Elam Dutch, MD;  Location: [place] [facility];  Service: Cardiovascular;  Laterality: Right;  . SHOULDER ARTHROSCOPY W/ ROTATOR CUFF REPAIR     Right  . VEIN HARVEST Left 02/12/2018   Procedure: VEIN HARVEST;  Surgeon: Elam Dutch, MD;  Location: [facility];  Service: Vascular;  Laterality: Left;     Current Outpatient Medications  Medication Sig Dispense Refill  . albuterol (PROVENTIL HFA;VENTOLIN HFA) 108 (90 Base) MCG/ACT inhaler Inhale 2 puffs into the lungs every 6 (six) hours as needed for wheezing or shortness  of breath.    Marland Kitchen amLODipine (NORVASC) 5 MG tablet TAKE 1 TABLET(5 MG) BY MOUTH DAILY 90 tablet 3  . aspirin (ASPIRIN LOW DOSE) 81 MG EC tablet Take 81 mg by mouth daily.      Marland Kitchen atorvastatin (LIPITOR) 40 MG tablet TAKE 1 TABLET(40 MG) BY MOUTH DAILY 90 tablet 3  . calcium carbonate (OSCAL) 1500 (600 Ca) MG TABS tablet Take 600 mg of elemental calcium by mouth daily with breakfast.    . Cholecalciferol 5000 units TABS Take 5,000 Units by mouth daily.    . cilostazol (PLETAL) 100 MG tablet Take 1 tablet (100 mg total) by mouth 2 (two) times daily. 180 tablet 2  . dorzolamide-timolol (COSOPT) 22.3-6.8 MG/ML ophthalmic solution Place 1 drop into the right eye 2 (two) times daily.     . IRON PO Take 1 tablet by mouth daily.    Marland Kitchen latanoprost (XALATAN) 0.005 % ophthalmic solution 1 drop at bedtime.    . nitroGLYCERIN (NITROSTAT) 0.4 MG SL tablet Place 1 tablet (0.4 mg total) under the tongue every 5 (five) minutes as needed for chest pain. 25 tablet 6  . omeprazole (PRILOSEC OTC) 20 MG tablet Take 20 mg by mouth daily.      Marland Kitchen  SYMBICORT 80-4.5 MCG/ACT inhaler Inhale 2 puffs into the lungs 2 (two) times daily.     . vitamin B-12 (CYANOCOBALAMIN) 1000 MCG tablet Take 1,000 mcg by mouth daily.    Marland Kitchen zinc gluconate 50 MG tablet Take 50 mg by mouth daily.     No current facility-administered medications for this visit.    Allergies:   Aspirin    Social History:  The patient  reports that he quit smoking about 2 years ago. His smoking use included cigarettes and cigars. He has a 50.00 pack-year smoking history. He has never used smokeless tobacco. He reports that he does not drink alcohol or use drugs.   Family History:  The patient's family history includes Arthritis in an other family member; Asthma in an other family member; Coronary artery disease in an other family member; Heart disease in an other family member.    ROS:  Please see the history of present illness.   Otherwise, review of systems are  positive for none.   All other systems are reviewed and negative.    PHYSICAL EXAM: VS:  BP (!) 142/80   Pulse 60   Ht 5\' 4"  (1.626 m)   Wt 159 lb (72.1 kg)   SpO2 96%   BMI 27.29 kg/m  , BMI Body mass index is 27.29 kg/m. GEN: Well nourished, well developed, in no acute distress  HEENT: normal  Neck: no JVD, carotid bruits, or masses Cardiac: RRR with premature beats; no murmurs, rubs, or gallops, mild to moderate bilateral leg edema Respiratory:  clear to auscultation bilaterally, normal work of breathing GI: soft, nontender, nondistended, + BS MS: no deformity or atrophy  Skin: warm and dry, no rash Neuro:  Strength and sensation are intact Psych: euthymic mood, full affect    EKG:  EKG is  ordered today. EKG showed normal sinus rhythm with no significant ST or T wave changes.   Recent Labs: No results found for requested labs within last 8760 hours.    Lipid Panel No results found for: CHOL, TRIG, HDL, CHOLHDL, VLDL, LDLCALC, LDLDIRECT    Wt Readings from Last 3 Encounters:  10/08/19 159 lb (72.1 kg)  02/19/19 150 lb (68 kg)  01/03/19 159 lb (72.1 kg)      Other studies Reviewed: Additional studies/ records that were reviewed today include:n/a  No flowsheet data found.    ASSESSMENT AND PLAN:  1. PAD : Status post bilateral femoral popliteal bypass but with occluded bypass on the right side.  The patient has minimal claudication at the present time and there is no evidence of critical limb ischemia.  Continue medical therapy with aspirin and cilostazol.  Repeat ABI and lower extremity duplex in August.    2. CAD status post CABG: The patient is stable and has no symptoms of angina. Continue aspirin daily.   3. Hyperlipidemia: Continue Atorvastatin with a target LDL <70.  Most recent lipid profile with his primary care physician showed an LDL of 43 and triglyceride of 45.  4. Tobacco abuse: He quit smoking in 2019.    5.  Essential hypertension:  Blood pressure is reasonably controlled on current medications.  I reviewed his labs done in December which showed normal hemoglobin with mild thrombocytopenia at 119,000.  Renal function was normal with a creatinine of 0.98.    Disposition:   FU with me in 6 months.   Signed,  [name], MD  10/08/2019 8:30 AM    [place]

## 2019-10-08 NOTE — Patient Instructions (Signed)
Medication Instructions:  No changes *If you need a refill on your cardiac medications before your next appointment, please call your pharmacy*   Lab Work: None ordered If you have labs (blood work) drawn today and your tests are completely normal, you will receive your results only by: Marland Kitchen MyChart Message (if you have MyChart) OR . A paper copy in the mail If you have any lab test that is abnormal or we need to change your treatment, we will call you to review the results.   Testing/Procedures: Your physician has requested that you have a lower extremity arterial duplex in August. During this test, ultrasound is used to evaluate arterial blood flow in the legs. Allow one hour for this exam. There are no restrictions or special instructions. This will take place at [place], [address].  Your physician has requested that you have an ankle brachial index (ABI) in August. During this test an ultrasound and blood pressure cuff are used to evaluate the arteries that supply the arms and legs with blood. Allow thirty minutes for this exam. There are no restrictions or special instructions. This will take place at [place], [address].   Follow-Up: At [facility], you and your health needs are our priority.  As part of our continuing mission to provide you with exceptional heart care, we have created designated Provider Care Teams.  These Care Teams include your primary Cardiologist (physician) and Advanced Practice Providers (APPs -  Physician Assistants and Nurse Practitioners) who all work together to provide you with the care you need, when you need it.  We recommend signing up for the patient portal called "MyChart".  Sign up information is provided on this After Visit Summary.  MyChart is used to connect with patients for Virtual Visits (Telemedicine).  Patients are able to view lab/test results, encounter notes, upcoming appointments, etc.  Non-urgent messages can be sent to  your provider as well.   To learn more about what you can do with MyChart, go to [email or web address].    Your next appointment:   6 month(s)  The format for your next appointment:   In Person  Provider:   [name], MD

## 2019-12-12 ENCOUNTER — Encounter: Payer: Self-pay | Admitting: Cardiovascular Disease

## 2019-12-12 ENCOUNTER — Other Ambulatory Visit: Payer: Self-pay

## 2019-12-12 ENCOUNTER — Ambulatory Visit: Payer: Medicare Other | Admitting: Cardiovascular Disease

## 2019-12-12 VITALS — BP 136/76 | HR 76 | Ht 64.0 in | Wt 161.0 lb

## 2019-12-12 DIAGNOSIS — I739 Peripheral vascular disease, unspecified: Secondary | ICD-10-CM | POA: Diagnosis not present

## 2019-12-12 DIAGNOSIS — I10 Essential (primary) hypertension: Secondary | ICD-10-CM | POA: Diagnosis not present

## 2019-12-12 DIAGNOSIS — E782 Mixed hyperlipidemia: Secondary | ICD-10-CM

## 2019-12-12 DIAGNOSIS — I2510 Atherosclerotic heart disease of native coronary artery without angina pectoris: Secondary | ICD-10-CM

## 2019-12-12 MED ORDER — NITROGLYCERIN 0.4 MG SL SUBL: 0.4000 mg | SUBLINGUAL_TABLET | SUBLINGUAL | 6 refills | Status: AC | PRN

## 2019-12-12 NOTE — Patient Instructions (Signed)

## 2019-12-12 NOTE — Progress Notes (Signed)
Cardiology Office Note:    Date:  12/12/2019   ID:  Devin [name], DOB [birth year], MRN [id number]  PCP:  [name], MD  [facility] Cardiologist:  Sherren Mocha, MD  [facility] Electrophysiologist:  None   Referring MD: [name], MD   Chief Complaint  Patient presents with   Coronary Artery Disease    History of Present Illness:    Devin [name] is a 74 y.o. male with a hx of coronary and peripheral arterial disease, presenting for follow-up evaluation today.  The patient underwent CABG remotely and most recent heart catheterization in 2007 demonstrated patent bypass grafts. He also has a history of hyperlipidemia and tobacco use. He has had multiple lower extremity revascularization surgeries, including femoral artery endarterectomy and bilateral femoral to below-knee popliteal bypass. He's been followed by Dr Fletcher Anon and Dr Oneida Alar. He has a hx of syncope with unrevealing evaluation including echo and event monitor studies.   The patient is here with his wife today.  He is doing fairly well from a cardiac perspective.  He denies chest pain, chest pressure, orthopnea, or PND.  He does have chronic leg swelling after undergoing revascularization surgeries.  He denies heart palpitations, lightheadedness, or syncope.  He does have chronic exertional dyspnea that he relates to COPD.  He quit smoking in 2019.  Past Medical History:  Diagnosis Date   Arthritis    Asthma    Carpal tunnel syndrome    Chest pain, unspecified    Chronic kidney disease    hx of kidney stones   Coronary artery disease    Coronary atherosclerosis of artery bypass graft    GERD (gastroesophageal reflux disease)    History of kidney stones    HOH (hard of hearing)    Hypertension    Occlusion and stenosis of carotid artery without mention of cerebral infarction    Other and unspecified hyperlipidemia    Personal history of unspecified circulatory disease    Shortness of  breath    Stroke ([place])    TIA (transient ischemic attack)     Past Surgical History:  Procedure Laterality Date   ABDOMINAL AORTOGRAM N/A 06/14/2017   Procedure: ABDOMINAL AORTOGRAM;  Surgeon: Wellington Hampshire, MD;  Location: [place] [facility];  Service: Cardiovascular;  Laterality: N/A;   ABDOMINAL AORTOGRAM N/A 01/26/2018   Procedure: ABDOMINAL AORTOGRAM;  Surgeon: Elam Dutch, MD;  Location: [place] [facility];  Service: Cardiovascular;  Laterality: N/A;   ABDOMINAL AORTOGRAM N/A 07/11/2018   Procedure: ABDOMINAL AORTOGRAM;  Surgeon: Marty Heck, MD;  Location: [place] [facility];  Service: Cardiovascular;  Laterality: N/A;   BACK SURGERY     cervical disc   CARDIAC CATHETERIZATION     CARPAL TUNNEL RELEASE     blateral carpal tunnel   CORONARY ARTERY BYPASS GRAFT     x5 12 yrs ago   ENDARTERECTOMY FEMORAL Right 07/03/2017   Procedure: ENDARTERECTOMY FEMORAL RIGHT;  Surgeon: Elam Dutch, MD;  [facility];  Service: Vascular;  Laterality: Right;   FEMORAL-POPLITEAL BYPASS GRAFT Right 07/03/2017   Procedure: BYPASS GRAFT FEMORAL-POPLITEAL ARTERY;  Surgeon: Elam Dutch, MD;  Location: [place];  Service: Vascular;  Laterality: Right;   FEMORAL-POPLITEAL BYPASS GRAFT Left 02/12/2018   Procedure: BYPASS GRAFT FEMORAL-POPLITEAL ARTERY LEFT LEG;  Surgeon: Elam Dutch, MD;  Location: [place];  Service: Vascular;  Laterality: Left;   LOWER EXTREMITY ANGIOGRAM Bilateral 08/05/2015   Procedure: Lower Extremity Angiogram;  Surgeon: Wellington Hampshire, MD;  Location: [place] [facility];  Service: Cardiovascular;  Laterality: Bilateral;   LOWER EXTREMITY ANGIOGRAPHY Bilateral 06/14/2017   Procedure: Lower Extremity Angiography;  Surgeon: Wellington Hampshire, MD;  Location: [place] [facility];  Service: Cardiovascular;  Laterality: Bilateral;   LOWER EXTREMITY ANGIOGRAPHY Bilateral 01/26/2018   Procedure: LOWER EXTREMITY ANGIOGRAPHY;  Surgeon: Elam Dutch,  MD;  Location: [place] [facility];  Service: Cardiovascular;  Laterality: Bilateral;   LOWER EXTREMITY ANGIOGRAPHY Right 07/11/2018   Procedure: LOWER EXTREMITY ANGIOGRAPHY;  Surgeon: Marty Heck, MD;  Location: [place] [facility];  Service: Cardiovascular;  Laterality: Right;  thrombolysis fem/tib bypass graft   NECK SURGERY     PATCH ANGIOPLASTY Right 07/03/2017   Procedure: VEIN PATCH ANGIOPLASTY OF FEMORAL AND POSTERIOR TIBIAL ARTERIES;  Surgeon: Elam Dutch, MD;  Location: [place];  Service: Vascular;  Laterality: Right;   PERIPHERAL VASCULAR BALLOON ANGIOPLASTY Right 07/12/2018   Procedure: PERIPHERAL VASCULAR BALLOON ANGIOPLASTY;  Surgeon: Marty Heck, MD;  Location: [place] [facility];  Service: Cardiovascular;  Laterality: Right;  Right fem posterior tibial graft and posterior tibial artery.   PERIPHERAL VASCULAR BALLOON ANGIOPLASTY Right 07/13/2018   Procedure: PERIPHERAL VASCULAR BALLOON ANGIOPLASTY;  Surgeon: Elam Dutch, MD;  Location: [place] [facility];  Service: Cardiovascular;  Laterality: Right;  PT   PERIPHERAL VASCULAR CATHETERIZATION N/A 08/05/2015   Procedure: Abdominal Aortogram;  Surgeon: Wellington Hampshire, MD;  Location: [place] [facility];  Service: Cardiovascular;  Laterality: N/A;   PERIPHERAL VASCULAR THROMBECTOMY N/A 07/12/2018   Procedure: PERIPHERAL VASCULAR THROMBECTOMY - LYSIS RECHECK;  Surgeon: Marty Heck, MD;  Location: [place] [facility];  Service: Cardiovascular;  Laterality: N/A;   PERIPHERAL VASCULAR THROMBECTOMY Right 07/13/2018   Procedure: PERIPHERAL VASCULAR THROMBECTOMY;  Surgeon: Elam Dutch, MD;  Location: [place] [facility];  Service: Cardiovascular;  Laterality: Right;   SHOULDER ARTHROSCOPY W/ ROTATOR CUFF REPAIR     Right   VEIN HARVEST Left 02/12/2018   Procedure: VEIN HARVEST;  Surgeon: Elam Dutch, MD;  [facility]: [facility];  Service: Vascular;  Laterality: Left;    Current Medications: Current  Meds  Medication Sig   albuterol (PROVENTIL HFA;VENTOLIN HFA) 108 (90 Base) MCG/ACT inhaler Inhale 2 puffs into the lungs every 6 (six) hours as needed for wheezing or shortness of breath.   amLODipine (NORVASC) 5 MG tablet TAKE 1 TABLET(5 MG) BY MOUTH DAILY   aspirin (ASPIRIN LOW DOSE) 81 MG EC tablet Take 81 mg by mouth daily.     atorvastatin (LIPITOR) 40 MG tablet TAKE 1 TABLET(40 MG) BY MOUTH DAILY   calcium carbonate (OSCAL) 1500 (600 Ca) MG TABS tablet Take 600 mg of elemental calcium by mouth daily with breakfast.   Cholecalciferol 5000 units TABS Take 5,000 Units by mouth daily.   cilostazol (PLETAL) 100 MG tablet Take 1 tablet (100 mg total) by mouth 2 (two) times daily.   dorzolamide-timolol (COSOPT) 22.3-6.8 MG/ML ophthalmic solution Place 1 drop into the right eye 2 (two) times daily.    IRON PO Take 1 tablet by mouth daily.   latanoprost (XALATAN) 0.005 % ophthalmic solution 1 drop at bedtime.   nitroGLYCERIN (NITROSTAT) 0.4 MG SL tablet Place 1 tablet (0.4 mg total) under the tongue every 5 (five) minutes as needed for chest pain.   omeprazole (PRILOSEC OTC) 20 MG tablet Take 20 mg by mouth daily.     SYMBICORT 80-4.5 MCG/ACT inhaler Inhale 2  puffs into the lungs 2 (two) times daily.    vitamin B-12 (CYANOCOBALAMIN) 1000 MCG tablet Take 1,000 mcg by mouth daily.   zinc gluconate 50 MG tablet Take 50 mg by mouth daily.   [DISCONTINUED] nitroGLYCERIN (NITROSTAT) 0.4 MG SL tablet Place 1 tablet (0.4 mg total) under the tongue every 5 (five) minutes as needed for chest pain.     Allergies:   Aspirin   Social History   Socioeconomic History   Marital status: Married    Spouse name: Not on file   Number of children: Not on file   Years of education: GED   Highest education level: Not on file  Occupational History   Occupation: fulltime   Tobacco Use   Smoking status: Former Smoker    Packs/day: 1.00    Years: 50.00    Pack years: 50.00    Types:  Cigarettes, Cigars    Quit date: 06/07/2017    Years since quitting: 2.5   Smokeless tobacco: Never Used  Vaping Use   Vaping Use: Former  Substance and Sexual Activity   Alcohol use: No    Alcohol/week: 0.0 standard drinks   Drug use: No   Sexual activity: Yes  Other Topics Concern   Not on file  Social History Narrative   Not on file   Social Determinants of Health   Financial Resource Strain:    Difficulty of Paying Living Expenses:   Food Insecurity:    Worried About Charity fundraiser in the Last Year:    Arboriculturist in the Last Year:   Transportation Needs:    Film/video editor (Medical):    Lack of Transportation (Non-Medical):   Physical Activity:    Days of Exercise per Week:    Minutes of Exercise per Session:   Stress:    Feeling of Stress :   Social Connections:    Frequency of Communication with Friends and Family:    Frequency of Social Gatherings with Friends and Family:    Attends Religious Services:    Active Member of Clubs or Organizations:    Attends Archivist Meetings:    Marital Status:      Family History: The patient's family history includes Arthritis in an other family member; Asthma in an other family member; Coronary artery disease in an other family member; Heart disease in an other family member. There is no history of Anesthesia problems, Hypotension, Malignant hyperthermia, or Pseudochol deficiency.  ROS:   Please see the history of present illness.    All other systems reviewed and are negative.  EKGs/Labs/Other Studies Reviewed:    The following studies were reviewed today: Echocardiogram 04/30/2018: Study Conclusions   - Left ventricle: The cavity size was at the upper limits of  normal. Wall thickness was normal. Systolic function was normal.  The estimated ejection fraction was in the range of 55% to 60%.  Wall motion was normal; there were no regional wall motion    abnormalities. Doppler parameters are consistent with abnormal  left ventricular relaxation (grade 1 diastolic dysfunction).  - Mitral valve: There was mild regurgitation.  - Left atrium: The atrium was mildly dilated.   EKG:  EKG is not ordered today.   Recent Labs: No results found for requested labs within last 8760 hours.  Recent Lipid Panel No results found for: CHOL, TRIG, HDL, CHOLHDL, VLDL, LDLCALC, LDLDIRECT  Physical Exam:    VS:  BP 136/76    Pulse 76  Ht 5\' 4"  (1.626 m)    Wt 161 lb (73 kg)    SpO2 93%    BMI 27.64 kg/m     Wt Readings from Last 3 Encounters:  12/12/19 161 lb (73 kg)  10/08/19 159 lb (72.1 kg)  02/19/19 150 lb (68 kg)     GEN:  Well nourished, well developed in no acute distress HEENT: Normal NECK: No JVD; No carotid bruits LYMPHATICS: No lymphadenopathy CARDIAC: RRR, no murmurs, rubs, gallops RESPIRATORY:  Clear to auscultation without rales, wheezing or rhonchi  ABDOMEN: Soft, non-tender, non-distended MUSCULOSKELETAL: 1+ right pretibial edema, 2+ left pretibial edema; No deformity  SKIN: Warm and dry NEUROLOGIC:  Alert and oriented x 3 PSYCHIATRIC:  Normal affect   ASSESSMENT:    1. Coronary artery disease involving native coronary artery of native heart without angina pectoris   2. PAD (peripheral artery disease) ([place])   3. Mixed hyperlipidemia   4. Essential hypertension   5. Coronary atherosclerosis of artery bypass graft   6. Peripheral vascular disease with claudication ([place])    PLAN:    In order of problems listed above:  1. Stable without symptoms of angina.  Continue amlodipine, aspirin, and atorvastatin. 2. The patient does have symptoms of claudication without rest pain or recurrent ischemic ulceration.  He appears stable and he is followed closely by vascular. 3. Treated with a high intensity statin drug (atorvastatin 40 mg).  Lipids followed by PCP. 4. Blood pressure well controlled on amlodipine 5. Intermittent  claudication symptoms are stable as outlined above   Medication Adjustments/Labs and Tests Ordered: Current medicines are reviewed at length with the patient today.  Concerns regarding medicines are outlined above.  No orders of the defined types were placed in this encounter.  Meds ordered this encounter  Medications   nitroGLYCERIN (NITROSTAT) 0.4 MG SL tablet    Sig: Place 1 tablet (0.4 mg total) under the tongue every 5 (five) minutes as needed for chest pain.    Dispense:  25 tablet    Refill:  6    Patient Instructions  Medication Instructions:  Your provider recommends that you continue on your current medications as directed. Please refer to the Current Medication list given to you today.   *If you need a refill on your cardiac medications before your next appointment, please call your pharmacy*   Follow-Up: At [facility], you and your health needs are our priority.  As part of our continuing mission to provide you with exceptional heart care, we have created designated Provider Care Teams.  These Care Teams include your primary Cardiologist (physician) and Advanced Practice Providers (APPs -  Physician Assistants and Nurse Practitioners) who all work together to provide you with the care you need, when you need it. Your next appointment:   12 month(s) The format for your next appointment:   In Person Provider:   You may see Sherren Mocha, MD or one of the following Advanced Practice Providers on your designated Care Team:    Richardson Dopp, PA-C  Robbie Lis, [place]      Signed, Sherren Mocha, MD  12/12/2019 12:28 PM    [place]

## 2019-12-23 ENCOUNTER — Other Ambulatory Visit: Payer: Self-pay

## 2019-12-23 ENCOUNTER — Ambulatory Visit (INDEPENDENT_AMBULATORY_CARE_PROVIDER_SITE_OTHER): Payer: Self-pay | Admitting: *Deleted

## 2019-12-23 VITALS — Ht 64.0 in | Wt 161.8 lb

## 2019-12-23 DIAGNOSIS — Z1211 Encounter for screening for malignant neoplasm of colon: Secondary | ICD-10-CM

## 2019-12-23 MED ORDER — PEG 3350-KCL-NA BICARB-NACL 420 G PO SOLR: 4000.0000 mL | Freq: Once | ORAL | 0 refills | Status: AC

## 2019-12-23 NOTE — Progress Notes (Signed)
Gastroenterology Pre-Procedure Review  Request Date: 12/23/2019 Requesting Physician: Denyce Robert, NP, Last TCS done 13 to 14 yrs ago by Dr. Arnoldo Morale, no polyps (per pt)  PATIENT REVIEW QUESTIONS: The patient responded to the following health history questions as indicated:    1. Diabetes Melitis: no 2. Joint replacements in the past 12 months: no 3. Major health problems in the past 3 months: no 4. Has an artificial valve or MVP: no 5. Has a defibrillator: no 6. Has been advised in past to take antibiotics in advance of a procedure like teeth cleaning: no 7. Family history of colon cancer: no  8. Alcohol Use: no 9. Illicit drug Use: no 10. History of sleep apnea: no  11. History of coronary artery or other vascular stents placed within the last 12 months: no 12. History of any prior anesthesia complications: no 13. There is no height or weight on file to calculate BMI. ht: 5'4 wt: 161.8 lbs    MEDICATIONS & ALLERGIES:    Patient reports the following regarding taking any blood thinners:   Plavix? no Aspirin? yes Coumadin? no Brilinta? no Xarelto? no Eliquis? no Pradaxa? no Savaysa? no Effient? no  Patient confirms/reports the following medications:  Current Outpatient Medications  Medication Sig Dispense Refill  . albuterol (PROVENTIL HFA;VENTOLIN HFA) 108 (90 Base) MCG/ACT inhaler Inhale 2 puffs into the lungs every 6 (six) hours as needed for wheezing or shortness of breath.    Marland Kitchen amLODipine (NORVASC) 5 MG tablet TAKE 1 TABLET(5 MG) BY MOUTH DAILY 90 tablet 3  . aspirin (ASPIRIN LOW DOSE) 81 MG EC tablet Take 81 mg by mouth daily.      Marland Kitchen atorvastatin (LIPITOR) 40 MG tablet TAKE 1 TABLET(40 MG) BY MOUTH DAILY 90 tablet 3  . calcium carbonate (OSCAL) 1500 (600 Ca) MG TABS tablet Take 600 mg of elemental calcium by mouth daily with breakfast.    . Cholecalciferol 5000 units TABS Take 5,000 Units by mouth daily.    . cilostazol (PLETAL) 100 MG tablet Take 1 tablet (100 mg  total) by mouth 2 (two) times daily. 180 tablet 2  . dorzolamide-timolol (COSOPT) 22.3-6.8 MG/ML ophthalmic solution Place 1 drop into the right eye 2 (two) times daily.     . IRON PO Take 1 tablet by mouth daily.    Marland Kitchen latanoprost (XALATAN) 0.005 % ophthalmic solution 1 drop at bedtime.    . nitroGLYCERIN (NITROSTAT) 0.4 MG SL tablet Place 1 tablet (0.4 mg total) under the tongue every 5 (five) minutes as needed for chest pain. 25 tablet 6  . omeprazole (PRILOSEC OTC) 20 MG tablet Take 20 mg by mouth daily.      . SYMBICORT 80-4.5 MCG/ACT inhaler Inhale 2 puffs into the lungs 2 (two) times daily.     . vitamin B-12 (CYANOCOBALAMIN) 1000 MCG tablet Take 1,000 mcg by mouth daily.    Marland Kitchen zinc gluconate 50 MG tablet Take 50 mg by mouth daily.     No current facility-administered medications for this visit.    Patient confirms/reports the following allergies:  Allergies  Allergen Reactions  . Aspirin Other (See Comments)    Has to take the coated aspirin    No orders of the defined types were placed in this encounter.   AUTHORIZATION INFORMATION Primary Insurance: [employer] Medicare,  ID [id number] ,  Group #: [id number] Pre-Cert / Auth required: No, not required  SCHEDULE INFORMATION: Procedure has been scheduled as follows:  Date: 01/27/2020, Time: 7:30 Location: [facility] with Dr.  Abbey Chatters  This Gastroenterology Pre-Precedure Review Form is being routed to the following provider(s): Aliene Altes, [place]

## 2019-12-23 NOTE — Progress Notes (Signed)
Ok to proceed with scheduling colonoscopy.   ASA III

## 2019-12-23 NOTE — Patient Instructions (Addendum)
Devin [name]   Jul 25, 1945 MRN: [id number]    Procedure Date: 02/18/2020 Time to register: 6:00 AM Place to register: [facility] Procedure Time: 7:30 AM Scheduled provider: Dr. Abbey Chatters PREPARATION FOR COLONOSCOPY WITH TRI-LYTE SPLIT PREP  Please notify us immediately if you are diabetic, take iron supplements, or if you are on Coumadin or any other blood thinners.   Please hold the following medications: n/a  You will need to purchase 1 fleet enema and 1 box of Bisacodyl '5mg'$  tablets.   2 DAYS BEFORE PROCEDURE:  DATE: 02/16/2020   DAY: Sunday Begin clear liquid diet AFTER your lunch meal. NO SOLID FOODS after this point.  1 DAY BEFORE PROCEDURE:  DATE: 02/17/2020   DAY: Monday Continue clear liquids the entire day - NO SOLID FOOD.   Diabetic medications adjustments for today: n/a  At 2:00 pm:  Take 2 Bisacodyl tablets.   At 4:00pm:  Start drinking your solution. Make sure you mix well per instructions on the bottle. Try to drink 1 (one) 8 ounce glass every 10-15 minutes until you have consumed HALF the jug. You should complete by 6:00pm.You must keep the left over solution refrigerated until completed next day.  Continue clear liquids. You must drink plenty of clear liquids to prevent dehyration and kidney failure.     DAY OF PROCEDURE:   DATE: 02/18/2020   DAY: Tuesday If you take medications for your heart, blood pressure or breathing, you may take these medications.  Diabetic medications adjustments for today: n/a  Five hours before your procedure time @  2:30 am:  Finish remaining amout of bowel prep, drinking 1 (one) 8 ounce glass every 10-15 minutes until complete. You have two hours to consume remaining prep.   Three hours before your procedure time @ 4:30 am:  Nothing by mouth.   At least one hour before going to the hospital:  Give yourself one Fleet enema. You may take your morning medications with sip of water unless we have instructed otherwise.       Please see below for Dietary Information.  CLEAR LIQUIDS INCLUDE:  Water Jello (NOT red in color)   Ice Popsicles (NOT red in color)   Tea (sugar ok, no milk/cream) Powdered fruit flavored drinks  Coffee (sugar ok, no milk/cream) Gatorade/ Lemonade/ Kool-Aid  (NOT red in color)   Juice: apple, white grape, white cranberry Soft drinks  Clear bullion, consomme, broth (fat free beef/chicken/vegetable)  Carbonated beverages (any kind)  Strained chicken noodle soup Hard Candy   Remember: Clear liquids are liquids that will allow you to see your fingers on the other side of a clear glass. Be sure liquids are NOT red in color, and not cloudy, but CLEAR.  DO NOT EAT OR DRINK ANY OF THE FOLLOWING:  Dairy products of any kind   Cranberry juice Tomato juice / V8 juice   Grapefruit juice Orange juice     Red grape juice  Do not eat any solid foods, including such foods as: cereal, oatmeal, yogurt, fruits, vegetables, creamed soups, eggs, bread, crackers, pureed foods in a blender, etc.   HELPFUL HINTS FOR DRINKING PREP SOLUTION:   Make sure prep is extremely cold. Mix and refrigerate the the morning of the prep. You may also put in the freezer.   You may try mixing some Crystal Light or Country Time Lemonade if you prefer. Mix in small amounts; add more if necessary.  Try drinking through a straw  Rinse mouth with water  or a mouthwash between glasses, to remove after-taste.  Try sipping on a cold beverage /ice/ popsicles between glasses of prep.  Place a piece of sugar-free hard candy in mouth between glasses.  If you become nauseated, try consuming smaller amounts, or stretch out the time between glasses. Stop for 30-60 minutes, then slowly start back drinking.        OTHER INSTRUCTIONS  You will need a responsible adult at least 73 years of age to accompany you and drive you home. This person must remain in the waiting room during your procedure. The hospital will cancel  your procedure if you do not have a responsible adult with you.   1. Wear loose fitting clothing that is easily removed. 2. Leave jewelry and other valuables at home.  3. Remove all body piercing jewelry and leave at home. 4. Total time from sign-in until discharge is approximately 2-3 hours. 5. You should go home directly after your procedure and rest. You can resume normal activities the day after your procedure. 6. The day of your procedure you should not:  Drive  Make legal decisions  Operate machinery  Drink alcohol  Return to work   You may call the office (Dept: [phone]) before 5:00pm, or page the doctor on call [phone]) after 5:00pm, for further instructions, if necessary.   Insurance Information YOU WILL NEED TO CHECK WITH YOUR INSURANCE COMPANY FOR THE BENEFITS OF COVERAGE YOU HAVE FOR THIS PROCEDURE.  UNFORTUNATELY, NOT ALL INSURANCE COMPANIES HAVE BENEFITS TO COVER ALL OR PART OF THESE TYPES OF PROCEDURES.  IT IS YOUR RESPONSIBILITY TO CHECK YOUR BENEFITS, HOWEVER, WE WILL BE GLAD TO ASSIST YOU WITH ANY CODES YOUR INSURANCE COMPANY MAY NEED.    PLEASE NOTE THAT MOST INSURANCE COMPANIES WILL NOT COVER A SCREENING COLONOSCOPY FOR PEOPLE UNDER THE AGE OF 50  IF YOU HAVE BCBS INSURANCE, YOU MAY HAVE BENEFITS FOR A SCREENING COLONOSCOPY BUT IF POLYPS ARE FOUND THE DIAGNOSIS WILL CHANGE AND THEN YOU MAY HAVE A DEDUCTIBLE THAT WILL NEED TO BE MET. SO PLEASE MAKE SURE YOU CHECK YOUR BENEFITS FOR A SCREENING COLONOSCOPY AS WELL AS A DIAGNOSTIC COLONOSCOPY.

## 2019-12-25 ENCOUNTER — Telehealth: Payer: Self-pay | Admitting: *Deleted

## 2019-12-25 NOTE — Progress Notes (Addendum)
Called pt and made him aware that we needed to reschedule his procedure to another day since he will be ASA III per [facility].  Pt voiced understanding.  He is aware that I will call him back once Sept procedure schedules have been released.

## 2019-12-25 NOTE — Telephone Encounter (Addendum)
Called pt and made him aware that we needed to reschedule his procedure to another day since he will be ASA III per [facility].  Pt voiced understanding.  He is aware that I will call him back once Sept procedure schedules have been released.

## 2020-01-01 ENCOUNTER — Telehealth: Payer: Self-pay | Admitting: *Deleted

## 2020-01-01 NOTE — Telephone Encounter (Signed)
Spoke to pt and he wants his wife to call me back to schedule his procedure.

## 2020-01-02 ENCOUNTER — Telehealth: Payer: Self-pay | Admitting: *Deleted

## 2020-01-02 NOTE — Telephone Encounter (Signed)
Spoke with wife and procedure has been rescheduled.

## 2020-01-02 NOTE — Progress Notes (Signed)
Called pt and he had me to discuss his procedure information with wife.  New procedure date is 02/18/2020 and Covid screening is 02/14/2020.  They were made aware that I would mail new prep instructions and Covid screening info.  They voiced understanding.

## 2020-01-02 NOTE — Telephone Encounter (Signed)
Pt's wife called to speak with Angie regarding scheduling his procedure. [phone]

## 2020-01-08 ENCOUNTER — Encounter (HOSPITAL_COMMUNITY): Payer: Medicare Other

## 2020-01-22 ENCOUNTER — Other Ambulatory Visit: Payer: Self-pay | Admitting: Cardiovascular Disease

## 2020-01-24 ENCOUNTER — Ambulatory Visit (HOSPITAL_COMMUNITY): Payer: Medicare Other

## 2020-01-24 ENCOUNTER — Other Ambulatory Visit (HOSPITAL_COMMUNITY)
Admission: RE | Admit: 2020-01-24 | Discharge: 2020-01-24 | Disposition: A | Discharge status: Home / Self Care | Payer: Medicare Other | Source: Ambulatory Visit | Attending: Internal Medicine | Admitting: Internal Medicine

## 2020-01-24 ENCOUNTER — Other Ambulatory Visit (HOSPITAL_COMMUNITY): Payer: Medicare Other

## 2020-01-24 NOTE — Progress Notes (Signed)
Patient was scheduled for today, too soon for his appt,. His Covid order was cancelled for today. Someone scheduled both appts., 02/14/2020, is closer to pt.'s  procedure 02/18/2020. Cancelled today's appt. Per our scheduler. Nothing further needed.

## 2020-02-14 ENCOUNTER — Encounter (HOSPITAL_COMMUNITY)
Admission: RE | Admit: 2020-02-14 | Discharge: 2020-02-14 | Disposition: A | Discharge status: Home / Self Care | Payer: Medicare Other | Source: Ambulatory Visit | Attending: Internal Medicine | Admitting: Internal Medicine

## 2020-02-14 ENCOUNTER — Other Ambulatory Visit: Payer: Self-pay

## 2020-02-14 ENCOUNTER — Other Ambulatory Visit (HOSPITAL_COMMUNITY): Payer: Medicare Other

## 2020-02-14 ENCOUNTER — Other Ambulatory Visit (HOSPITAL_COMMUNITY)
Admission: RE | Admit: 2020-02-14 | Discharge: 2020-02-14 | Disposition: A | Discharge status: Home / Self Care | Payer: Medicare Other | Source: Ambulatory Visit | Attending: Internal Medicine | Admitting: Internal Medicine

## 2020-02-14 DIAGNOSIS — Z01812 Encounter for preprocedural laboratory examination: Secondary | ICD-10-CM | POA: Insufficient documentation

## 2020-02-14 DIAGNOSIS — Z20822 Contact with and (suspected) exposure to covid-19: Secondary | ICD-10-CM | POA: Diagnosis not present

## 2020-02-14 LAB — SARS CORONAVIRUS 2 (TAT 6-24 HRS): SARS Coronavirus 2: NEGATIVE

## 2020-02-18 ENCOUNTER — Encounter (HOSPITAL_COMMUNITY)
Admission: RE | Disposition: A | Discharge status: Home / Self Care | Payer: Self-pay | Source: Home / Self Care | Attending: Internal Medicine

## 2020-02-18 ENCOUNTER — Encounter (HOSPITAL_COMMUNITY): Payer: Self-pay

## 2020-02-18 ENCOUNTER — Ambulatory Visit (HOSPITAL_COMMUNITY)
Admission: RE | Admit: 2020-02-18 | Discharge: 2020-02-18 | Disposition: A | Discharge status: Home / Self Care | Payer: Medicare Other | Attending: Internal Medicine | Admitting: Internal Medicine

## 2020-02-18 ENCOUNTER — Ambulatory Visit (HOSPITAL_COMMUNITY): Payer: Medicare Other | Admitting: Anesthesiology

## 2020-02-18 ENCOUNTER — Other Ambulatory Visit: Payer: Self-pay

## 2020-02-18 DIAGNOSIS — M1990 Unspecified osteoarthritis, unspecified site: Secondary | ICD-10-CM | POA: Diagnosis not present

## 2020-02-18 DIAGNOSIS — E785 Hyperlipidemia, unspecified: Secondary | ICD-10-CM | POA: Diagnosis not present

## 2020-02-18 DIAGNOSIS — Z8679 Personal history of other diseases of the circulatory system: Secondary | ICD-10-CM | POA: Diagnosis not present

## 2020-02-18 DIAGNOSIS — I2510 Atherosclerotic heart disease of native coronary artery without angina pectoris: Secondary | ICD-10-CM | POA: Insufficient documentation

## 2020-02-18 DIAGNOSIS — I129 Hypertensive chronic kidney disease with stage 1 through stage 4 chronic kidney disease, or unspecified chronic kidney disease: Secondary | ICD-10-CM | POA: Diagnosis not present

## 2020-02-18 DIAGNOSIS — Z87891 Personal history of nicotine dependence: Secondary | ICD-10-CM | POA: Diagnosis not present

## 2020-02-18 DIAGNOSIS — D123 Benign neoplasm of transverse colon: Secondary | ICD-10-CM | POA: Insufficient documentation

## 2020-02-18 DIAGNOSIS — R0602 Shortness of breath: Secondary | ICD-10-CM | POA: Diagnosis not present

## 2020-02-18 DIAGNOSIS — Z951 Presence of aortocoronary bypass graft: Secondary | ICD-10-CM | POA: Insufficient documentation

## 2020-02-18 DIAGNOSIS — Z1211 Encounter for screening for malignant neoplasm of colon: Secondary | ICD-10-CM

## 2020-02-18 DIAGNOSIS — Z7982 Long term (current) use of aspirin: Secondary | ICD-10-CM | POA: Insufficient documentation

## 2020-02-18 DIAGNOSIS — Z950 Presence of cardiac pacemaker: Secondary | ICD-10-CM | POA: Insufficient documentation

## 2020-02-18 DIAGNOSIS — N189 Chronic kidney disease, unspecified: Secondary | ICD-10-CM | POA: Diagnosis not present

## 2020-02-18 DIAGNOSIS — Z825 Family history of asthma and other chronic lower respiratory diseases: Secondary | ICD-10-CM | POA: Insufficient documentation

## 2020-02-18 DIAGNOSIS — Z87442 Personal history of urinary calculi: Secondary | ICD-10-CM | POA: Insufficient documentation

## 2020-02-18 DIAGNOSIS — Z79899 Other long term (current) drug therapy: Secondary | ICD-10-CM | POA: Insufficient documentation

## 2020-02-18 DIAGNOSIS — K648 Other hemorrhoids: Secondary | ICD-10-CM | POA: Insufficient documentation

## 2020-02-18 DIAGNOSIS — D122 Benign neoplasm of ascending colon: Secondary | ICD-10-CM

## 2020-02-18 DIAGNOSIS — D120 Benign neoplasm of cecum: Secondary | ICD-10-CM | POA: Insufficient documentation

## 2020-02-18 DIAGNOSIS — Z8673 Personal history of transient ischemic attack (TIA), and cerebral infarction without residual deficits: Secondary | ICD-10-CM | POA: Insufficient documentation

## 2020-02-18 DIAGNOSIS — Z7951 Long term (current) use of inhaled steroids: Secondary | ICD-10-CM | POA: Insufficient documentation

## 2020-02-18 DIAGNOSIS — K5730 Diverticulosis of large intestine without perforation or abscess without bleeding: Secondary | ICD-10-CM | POA: Diagnosis not present

## 2020-02-18 DIAGNOSIS — I739 Peripheral vascular disease, unspecified: Secondary | ICD-10-CM | POA: Diagnosis not present

## 2020-02-18 DIAGNOSIS — Z8249 Family history of ischemic heart disease and other diseases of the circulatory system: Secondary | ICD-10-CM | POA: Insufficient documentation

## 2020-02-18 DIAGNOSIS — Z886 Allergy status to analgesic agent status: Secondary | ICD-10-CM | POA: Insufficient documentation

## 2020-02-18 DIAGNOSIS — J45909 Unspecified asthma, uncomplicated: Secondary | ICD-10-CM | POA: Insufficient documentation

## 2020-02-18 DIAGNOSIS — Z8261 Family history of arthritis: Secondary | ICD-10-CM | POA: Insufficient documentation

## 2020-02-18 DIAGNOSIS — K219 Gastro-esophageal reflux disease without esophagitis: Secondary | ICD-10-CM | POA: Diagnosis not present

## 2020-02-18 HISTORY — PX: POLYPECTOMY: SHX5525

## 2020-02-18 HISTORY — PX: COLONOSCOPY WITH PROPOFOL: SHX5780

## 2020-02-18 SURGERY — COLONOSCOPY WITH PROPOFOL
Anesthesia: General

## 2020-02-18 MED ORDER — SODIUM CHLORIDE (PF) 0.9 % IJ SOLN
INTRAMUSCULAR | Status: AC
  Filled 2020-02-18: qty 20

## 2020-02-18 MED ORDER — PROPOFOL 10 MG/ML IV BOLUS
INTRAVENOUS | Status: DC | PRN
  Administered 2020-02-18: 50 mg via INTRAVENOUS

## 2020-02-18 MED ORDER — PROPOFOL 500 MG/50ML IV EMUL
INTRAVENOUS | Status: DC | PRN
  Administered 2020-02-18: 100 ug/kg/min via INTRAVENOUS

## 2020-02-18 MED ORDER — PROPOFOL 10 MG/ML IV BOLUS
INTRAVENOUS | Status: AC
  Filled 2020-02-18: qty 80

## 2020-02-18 MED ORDER — STERILE WATER FOR IRRIGATION IR SOLN
Status: DC | PRN
  Administered 2020-02-18: 1.5 mL

## 2020-02-18 MED ORDER — PHENYLEPHRINE HCL (PRESSORS) 10 MG/ML IV SOLN
INTRAVENOUS | Status: AC
  Filled 2020-02-18: qty 2

## 2020-02-18 MED ORDER — LIDOCAINE HCL (CARDIAC) PF 100 MG/5ML IV SOSY
PREFILLED_SYRINGE | INTRAVENOUS | Status: DC | PRN
  Administered 2020-02-18: 100 mg via INTRAVENOUS

## 2020-02-18 MED ORDER — CHLORHEXIDINE GLUCONATE CLOTH 2 % EX PADS: 6.0000 | MEDICATED_PAD | Freq: Once | CUTANEOUS | Status: DC

## 2020-02-18 MED ORDER — LIDOCAINE 2% (20 MG/ML) 5 ML SYRINGE
INTRAMUSCULAR | Status: AC
  Filled 2020-02-18: qty 10

## 2020-02-18 MED ORDER — LACTATED RINGERS IV SOLN: Freq: Once | INTRAVENOUS | Status: AC

## 2020-02-18 MED ORDER — LACTATED RINGERS IV SOLN: INTRAVENOUS | Status: DC | PRN

## 2020-02-18 NOTE — Anesthesia Postprocedure Evaluation (Signed)
Anesthesia Post Note  Patient: Devin [name]  Procedure(s) Performed: COLONOSCOPY WITH PROPOFOL (N/A ) POLYPECTOMY  Patient location during evaluation: PACU Anesthesia Type: General Level of consciousness: awake, oriented and patient cooperative Pain management: satisfactory to patient Vital Signs Assessment: post-procedure vital signs reviewed and stable Respiratory status: spontaneous breathing, respiratory function stable and nonlabored ventilation Cardiovascular status: stable Postop Assessment: no apparent nausea or vomiting Anesthetic complications: no   No complications documented.   Last Vitals:  Vitals:   02/18/20 0728  BP: (!) 183/85  Pulse: 68  Resp: (!) 21  Temp: 36.8 C  SpO2: 97%    Last Pain:  Vitals:   02/18/20 0740  TempSrc:   PainSc: 0-No pain                 Tyquasia Pant

## 2020-02-18 NOTE — Op Note (Signed)
[facility] Patient Name: Devin [name] Procedure Date: 02/18/2020 7:09 AM MRN: [id number] Date of Birth: 03/04/1946 Attending MD: Elon Alas. Abbey Chatters DO CSN: [id number] Age: 74 Admit Type: Outpatient Procedure:                Colonoscopy Indications:              Screening for colorectal malignant neoplasm Providers:                Elon Alas. Abbey Chatters, DO, Caprice Kluver, Crystal Page,                            Casimer Bilis, Technician, Aram Candela Referring MD:              Medicines:                See the Anesthesia note for documentation of the                            administered medications Complications:            No immediate complications. Estimated Blood Loss:     Estimated blood loss was minimal. Procedure:                Pre-Anesthesia Assessment:                           - The anesthesia plan was to use monitored                            anesthesia care (MAC).                           After obtaining informed consent, the colonoscope                            was passed under direct vision. Throughout the                            procedure, the patient's blood pressure, pulse, and                            oxygen saturations were monitored continuously. The                            [id number]-H190DL ([id number]) scope was introduced through                            the anus and advanced to the the cecum, identified                            by appendiceal orifice and ileocecal valve. The                            colonoscopy was performed without difficulty. The                            patient tolerated  the procedure well. The quality                            of the bowel preparation was evaluated using the                            BBPS [facility] Bowel Preparation Scale) with scores                            of: Right Colon = 2 (minor amount of residual                            staining, small fragments of stool and/or opaque                             liquid, but mucosa seen well), Transverse Colon = 3                            (entire mucosa seen well with no residual staining,                            small fragments of stool or opaque liquid) and Left                            Colon = 3 (entire mucosa seen well with no residual                            staining, small fragments of stool or opaque                            liquid). The total BBPS score equals 8. The quality                            of the bowel preparation was good. Scope In: 7:44:55 AM Scope Out: 8:19:25 AM Scope Withdrawal Time: 0 hours 32 minutes 6 seconds  Total Procedure Duration: 0 hours 34 minutes 30 seconds  Findings:      The perianal and digital rectal examinations were normal.      Non-bleeding internal hemorrhoids were found during endoscopy.      Multiple small-mouthed diverticula were found in the sigmoid colon.      A 12 mm polyp was found in the cecum. The polyp was sessile. The polyp       was removed with a hot snare. Resection and retrieval were complete.      A 15 mm polyp was found in the ascending colon. The polyp was sessile.       Preparations were made for mucosal resection. Orise gel was injected       with adequate lift of the lesion from the muscularis propria. Margins       were well demarcated. Snare mucosal resection was performed. A 17 mm       area was resected. Resection and retrieval were complete. Edges were       then burned with snare tip. There was no bleeding at the end  of the       procedure. To prevent bleeding after the polypectomy, one hemostatic       clip was successfully placed (MR conditional). There was no bleeding at       the end of the procedure.      A 9 mm polyp was found in the transverse colon. The polyp was sessile.       The polyp was removed with a hot snare. Resection and retrieval were       complete.      A 5 mm polyp was found in the transverse colon. The polyp was sessile.       The  polyp was removed with a cold snare. Resection and retrieval were       complete.      A 3 mm polyp was found in the transverse colon. The polyp was sessile.       The polyp was removed with a cold snare. Resection and retrieval were       complete. Impression:               - Non-bleeding internal hemorrhoids.                           - Diverticulosis in the sigmoid colon.                           - One 12 mm polyp in the cecum, removed with a hot                            snare. Resected and retrieved.                           - One 15 mm polyp in the ascending colon, removed                            with mucosal resection. Resected and retrieved.                            Clip (MR conditional) was placed.                           - One 9 mm polyp in the transverse colon, removed                            with a hot snare. Resected and retrieved.                           - One 5 mm polyp in the transverse colon, removed                            with a cold snare. Resected and retrieved.                           - One 3 mm polyp in the transverse colon, removed  with a cold snare. Resected and retrieved.                           - Mucosal resection was performed. Resection and                            retrieval were complete. Moderate Sedation:      Per Anesthesia Care Recommendation:           - Patient has a contact number available for                            emergencies. The signs and symptoms of potential                            delayed complications were discussed with the                            patient. Return to normal activities tomorrow.                            Written discharge instructions were provided to the                            patient.                           - Resume previous diet.                           - Continue present medications.                           - Await pathology results.                            - Repeat colonoscopy in 1 year for surveillance.                           - Return to GI clinic PRN. Procedure Code(s):        --- Professional ---                           [phone], Colonoscopy, flexible; with endoscopic                            mucosal resection                           [id number], 60, Colonoscopy, flexible; with removal of                            tumor(s), polyp(s), or other lesion(s) by snare                            technique Diagnosis Code(s):        --- Professional ---  Z12.11, Encounter for screening for malignant                            neoplasm of colon                           K64.8, Other hemorrhoids                           K63.5, Polyp of colon                           K57.30, Diverticulosis of large intestine without                            perforation or abscess without bleeding CPT copyright 2019 American Medical Association. All rights reserved. The codes documented in this report are preliminary and upon coder review may  be revised to meet current compliance requirements. Elon Alas. Abbey Chatters, [place] Chatters, DO 02/18/2020 8:23:07 AM This report has been signed electronically. Number of Addenda: 0

## 2020-02-18 NOTE — Discharge Instructions (Addendum)
Colonoscopy Discharge Instructions  Read the instructions outlined below and refer to this sheet in the next few weeks. These discharge instructions provide you with general information on caring for yourself after you leave the hospital. Your doctor may also give you specific instructions. While your treatment has been planned according to the most current medical practices available, unavoidable complications occasionally occur.   ACTIVITY  You may resume your regular activity, but move at a slower pace for the next 24 hours.   Take frequent rest periods for the next 24 hours.   Walking will help get rid of the air and reduce the bloated feeling in your belly (abdomen).   No driving for 24 hours (because of the medicine (anesthesia) used during the test).    Do not sign any important legal documents or operate any machinery for 24 hours (because of the anesthesia used during the test).  NUTRITION  Drink plenty of fluids.   You may resume your normal diet as instructed by your doctor.   Begin with a light meal and progress to your normal diet. Heavy or fried foods are harder to digest and may make you feel sick to your stomach (nauseated).   Avoid alcoholic beverages for 24 hours or as instructed.  MEDICATIONS  You may resume your normal medications unless your doctor tells you otherwise.  WHAT YOU CAN EXPECT TODAY  Some feelings of bloating in the abdomen.   Passage of more gas than usual.   Spotting of blood in your stool or on the toilet paper.  IF YOU HAD POLYPS REMOVED DURING THE COLONOSCOPY:  No aspirin products for 7 days or as instructed.   No alcohol for 7 days or as instructed.   Eat a soft diet for the next 24 hours.  FINDING OUT THE RESULTS OF YOUR TEST Not all test results are available during your visit. If your test results are not back during the visit, make an appointment with your caregiver to find out the results. Do not assume everything is normal if  you have not heard from your caregiver or the medical facility. It is important for you to follow up on all of your test results.  SEEK IMMEDIATE MEDICAL ATTENTION IF:  You have more than a spotting of blood in your stool.   Your belly is swollen (abdominal distention).   You are nauseated or vomiting.   You have a temperature over 101.   You have abdominal pain or discomfort that is severe or gets worse throughout the day.   Your colonoscopy showed 5 polyps, 2 which were quite large.  I did remove all of them successfully.  Await pathology results, my office will contact you.  Based on the size and number of polyps, I recommend we repeat colonoscopy in 1 year.    I hope you have a great rest of your week!  Elon Alas. Abbey Chatters, D.O. Gastroenterology and Hepatology [facility]     Colon Polyps  Polyps are tissue growths inside the body. Polyps can grow in many places, including the large intestine (colon). A polyp may be a round bump or a mushroom-shaped growth. You could have one polyp or several. Most colon polyps are noncancerous (benign). However, some colon polyps can become cancerous over time. Finding and removing the polyps early can help prevent this. What are the causes? The exact cause of colon polyps is not known. What increases the risk? You are more likely to develop this condition if you:  Have a family history of colon cancer or colon polyps.  Are older than 50 or older than 45 if you are African American.  Have inflammatory bowel disease, such as ulcerative colitis or Crohn's disease.  Have certain hereditary conditions, such as: ? Familial adenomatous polyposis. ? Lynch syndrome. ? Turcot syndrome. ? Peutz-Jeghers syndrome.  Are overweight.  Smoke cigarettes.  Do not get enough exercise.  Drink too much alcohol.  Eat a diet that is high in fat and red meat and low in fiber.  Had childhood cancer that was treated with  abdominal radiation. What are the signs or symptoms? Most polyps do not cause symptoms. If you have symptoms, they may include:  Blood coming from your rectum when having a bowel movement.  Blood in your stool. The stool may look dark red or black.  Abdominal pain.  A change in bowel habits, such as constipation or diarrhea. How is this diagnosed? This condition is diagnosed with a colonoscopy. This is a procedure in which a lighted, flexible scope is inserted into the anus and then passed into the colon to examine the area. Polyps are sometimes found when a colonoscopy is done as part of routine cancer screening tests. How is this treated? Treatment for this condition involves removing any polyps that are found. Most polyps can be removed during a colonoscopy. Those polyps will then be tested for cancer. Additional treatment may be needed depending on the results of testing. Follow these instructions at home: Lifestyle  Maintain a healthy weight, or lose weight if recommended by your health care provider.  Exercise every day or as told by your health care provider.  Do not use any products that contain nicotine or tobacco, such as cigarettes and e-cigarettes. If you need help quitting, ask your health care provider.  If you drink alcohol, limit how much you have: ? 0-1 drink a day for women. ? 0-2 drinks a day for men.  Be aware of how much alcohol is in your drink. In the [place] one drink equals one 12 oz bottle of beer (355 mL), one 5 oz glass of wine (148 mL), or one 1 oz shot of hard liquor (44 mL). Eating and drinking   Eat foods that are high in fiber, such as fruits, vegetables, and whole grains.  Eat foods that are high in calcium and vitamin D, such as milk, cheese, yogurt, eggs, liver, fish, and broccoli.  Limit foods that are high in fat, such as fried foods and desserts.  Limit the amount of red meat and processed meat you eat, such as hot dogs, sausage, bacon, and  lunch meats. General instructions  Keep all follow-up visits as told by your health care provider. This is important. ? This includes having regularly scheduled colonoscopies. ? Talk to your health care provider about when you need a colonoscopy. Contact a health care provider if:  You have new or worsening bleeding during a bowel movement.  You have new or increased blood in your stool.  You have a change in bowel habits.  You lose weight for no known reason. Summary  Polyps are tissue growths inside the body. Polyps can grow in many places, including the colon.  Most colon polyps are noncancerous (benign), but some can become cancerous over time.  This condition is diagnosed with a colonoscopy.  Treatment for this condition involves removing any polyps that are found. Most polyps can be removed during a colonoscopy. This information is not intended to replace  advice given to you by your health care provider. Make sure you discuss any questions you have with your health care provider. Document Revised: 08/31/2017 Document Reviewed: 08/31/2017 Elsevier Patient Education  [place] After These instructions provide you with information about caring for yourself after your procedure. Your health care provider may also give you more specific instructions. Your treatment has been planned according to current medical practices, but problems sometimes occur. Call your health care provider if you have any problems or questions after your procedure. What can I expect after the procedure? After your procedure, you may:  Feel sleepy for several hours.  Feel clumsy and have poor balance for several hours.  Feel forgetful about what happened after the procedure.  Have poor judgment for several hours.  Feel nauseous or vomit.  Have a sore throat if you had a breathing tube during the procedure. Follow these instructions at home: For at  least 24 hours after the procedure:      Have a responsible adult stay with you. It is important to have someone help care for you until you are awake and alert.  Rest as needed.  Do not: ? Participate in activities in which you could fall or become injured. ? Drive. ? Use heavy machinery. ? Drink alcohol. ? Take sleeping pills or medicines that cause drowsiness. ? Make important decisions or sign legal documents. ? Take care of children on your own. Eating and drinking  Follow the diet that is recommended by your health care provider.  If you vomit, drink water, juice, or soup when you can drink without vomiting.  Make sure you have little or no nausea before eating solid foods. General instructions  Take over-the-counter and prescription medicines only as told by your health care provider.  If you have sleep apnea, surgery and certain medicines can increase your risk for breathing problems. Follow instructions from your health care provider about wearing your sleep device: ? Anytime you are sleeping, including during daytime naps. ? While taking prescription pain medicines, sleeping medicines, or medicines that make you drowsy.  If you smoke, do not smoke without supervision.  Keep all follow-up visits as told by your health care provider. This is important. Contact a health care provider if:  You keep feeling nauseous or you keep vomiting.  You feel light-headed.  You develop a rash.  You have a fever. Get help right away if:  You have trouble breathing. Summary  For several hours after your procedure, you may feel sleepy and have poor judgment.  Have a responsible adult stay with you for at least 24 hours or until you are awake and alert. This information is not intended to replace advice given to you by your health care provider. Make sure you discuss any questions you have with your health care provider. Document Revised: 08/14/2017 Document Reviewed:  09/06/2015 Elsevier Patient Education  [place].

## 2020-02-18 NOTE — Transfer of Care (Signed)
Immediate Anesthesia Transfer of Care Note  Patient: Devin [name]  Procedure(s) Performed: COLONOSCOPY WITH PROPOFOL (N/A ) POLYPECTOMY  Patient Location: PACU  Anesthesia Type:General  Level of Consciousness: awake, alert , oriented and patient cooperative  Airway & Oxygen Therapy: Patient Spontanous Breathing and Patient connected to nasal cannula oxygen  Post-op Assessment: Report given to RN, Post -op Vital signs reviewed and stable and Patient moving all extremities X 4  Post vital signs: Reviewed and stable  Last Vitals:  Vitals Value Taken Time  BP    Temp    Pulse    Resp    SpO2      Last Pain:  Vitals:   02/18/20 0740  TempSrc:   PainSc: 0-No pain         Complications: No complications documented.

## 2020-02-18 NOTE — H&P (Signed)
Primary Care Physician:  [name], MD Primary Gastroenterologist:  Dr. Abbey Chatters  Pre-Procedure History & Physical: HPI:  Devin [name] is a 74 y.o. male is here for a screening colonoscopy.   Past Medical History:  Diagnosis Date  . Arthritis   . Asthma   . Carpal tunnel syndrome   . Chest pain, unspecified   . Chronic kidney disease    hx of kidney stones  . Coronary artery disease   . Coronary atherosclerosis of artery bypass graft   . GERD (gastroesophageal reflux disease)   . History of kidney stones   . HOH (hard of hearing)   . Hypertension   . 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 Other and unspecified hyperlipidemia   . Personal history of unspecified circulatory disease   . Shortness of breath   . Stroke ([place])   . TIA (transient ischemic attack)     Past Surgical History:  Procedure Laterality Date  . ABDOMINAL AORTOGRAM N/A 06/14/2017   Procedure: ABDOMINAL AORTOGRAM;  Surgeon: Wellington Hampshire, MD;  Location: [place] [facility];  Service: Cardiovascular;  Laterality: N/A;  . ABDOMINAL AORTOGRAM N/A 01/26/2018   Procedure: ABDOMINAL AORTOGRAM;  Surgeon: Elam Dutch, MD;  Location: [place] [facility];  Service: Cardiovascular;  Laterality: N/A;  . ABDOMINAL AORTOGRAM N/A 07/11/2018   Procedure: ABDOMINAL AORTOGRAM;  Surgeon: Marty Heck, MD;  Location: [place] [facility];  Service: Cardiovascular;  Laterality: N/A;  . BACK SURGERY     cervical disc  . CARDIAC CATHETERIZATION    . CARPAL TUNNEL RELEASE     blateral carpal tunnel  . CORONARY ARTERY BYPASS GRAFT     x5 12 yrs ago  . ENDARTERECTOMY FEMORAL Right 07/03/2017   Procedure: ENDARTERECTOMY FEMORAL RIGHT;  Surgeon: Elam Dutch, MD;  Location: [place];  Service: Vascular;  Laterality: Right;  . FEMORAL-POPLITEAL BYPASS GRAFT Right 07/03/2017   Procedure: BYPASS GRAFT FEMORAL-POPLITEAL ARTERY;  Surgeon: Elam Dutch, MD;  Location: [place];   Service: Vascular;  Laterality: Right;  . FEMORAL-POPLITEAL BYPASS GRAFT Left 02/12/2018   Procedure: BYPASS GRAFT FEMORAL-POPLITEAL ARTERY LEFT LEG;  Surgeon: Elam Dutch, MD;  Location: [place];  Service: Vascular;  Laterality: Left;  . LOWER EXTREMITY ANGIOGRAM Bilateral 08/05/2015   Procedure: Lower Extremity Angiogram;  Surgeon: Wellington Hampshire, MD;  Location: [place] [facility];  Service: Cardiovascular;  Laterality: Bilateral;  . LOWER EXTREMITY ANGIOGRAPHY Bilateral 06/14/2017   Procedure: Lower Extremity Angiography;  Surgeon: Wellington Hampshire, MD;  Location: [place] [facility];  Service: Cardiovascular;  Laterality: Bilateral;  . LOWER EXTREMITY ANGIOGRAPHY Bilateral 01/26/2018   Procedure: LOWER EXTREMITY ANGIOGRAPHY;  Surgeon: Elam Dutch, MD;  Location: [place] [facility];  Service: Cardiovascular;  Laterality: Bilateral;  . LOWER EXTREMITY ANGIOGRAPHY Right 07/11/2018   Procedure: LOWER EXTREMITY ANGIOGRAPHY;  Surgeon: Marty Heck, MD;  Location: [place] [facility];  Service: Cardiovascular;  Laterality: Right;  thrombolysis fem/tib bypass graft  . NECK SURGERY    . PATCH ANGIOPLASTY Right 07/03/2017   Procedure: VEIN PATCH ANGIOPLASTY OF FEMORAL AND POSTERIOR TIBIAL ARTERIES;  Surgeon: Elam Dutch, MD;  Location: [place];  Service: Vascular;  Laterality: Right;  . PERIPHERAL VASCULAR BALLOON ANGIOPLASTY Right 07/12/2018   Procedure: PERIPHERAL VASCULAR BALLOON ANGIOPLASTY;  Surgeon: Marty Heck, MD;  Location: [place] [facility];  Service: Cardiovascular;  Laterality: Right;  Right fem posterior tibial graft and  posterior tibial artery.  Marland Kitchen PERIPHERAL VASCULAR BALLOON ANGIOPLASTY Right 07/13/2018   Procedure: PERIPHERAL VASCULAR BALLOON ANGIOPLASTY;  Surgeon: Elam Dutch, MD;  Location: [place] [facility];  Service: Cardiovascular;  Laterality: Right;  PT  . PERIPHERAL VASCULAR CATHETERIZATION N/A 08/05/2015   Procedure: Abdominal Aortogram;  Surgeon:  Wellington Hampshire, MD;  Location: [place] [facility];  Service: Cardiovascular;  Laterality: N/A;  . PERIPHERAL VASCULAR THROMBECTOMY N/A 07/12/2018   Procedure: PERIPHERAL VASCULAR THROMBECTOMY - LYSIS RECHECK;  Surgeon: Marty Heck, MD;  Location: [place] [facility];  Service: Cardiovascular;  Laterality: N/A;  . PERIPHERAL VASCULAR THROMBECTOMY Right 07/13/2018   Procedure: PERIPHERAL VASCULAR THROMBECTOMY;  Surgeon: Elam Dutch, MD;  Location: [place] [facility];  Service: Cardiovascular;  Laterality: Right;  . SHOULDER ARTHROSCOPY W/ ROTATOR CUFF REPAIR     Right  . VEIN HARVEST Left 02/12/2018   Procedure: VEIN HARVEST;  Surgeon: Elam Dutch, MD;  Location: [facility] OR;  Service: Vascular;  Laterality: Left;    Prior to Admission medications   Medication Sig Start Date End Date Taking? Authorizing Provider  acidophilus (RISAQUAD) CAPS capsule Take 1 capsule by mouth daily.   Yes [provider]  albuterol (PROVENTIL HFA;VENTOLIN HFA) 108 (90 Base) MCG/ACT inhaler Inhale 2 puffs into the lungs every 6 (six) hours as needed for wheezing or shortness of breath.   Yes [provider]  amLODipine (NORVASC) 5 MG tablet TAKE 1 TABLET(5 MG) BY MOUTH DAILY Patient taking differently: Take 5 mg by mouth daily.  07/09/19  Yes Sherren Mocha, MD  aspirin (ASPIRIN LOW DOSE) 81 MG EC tablet Take 81 mg by mouth daily.     Yes [provider]  atorvastatin (LIPITOR) 40 MG tablet TAKE 1 TABLET(40 MG) BY MOUTH DAILY Patient taking differently: Take 40 mg by mouth daily.  07/09/19  Yes Sherren Mocha, MD  calcium carbonate (OSCAL) 1500 (600 Ca) MG TABS tablet Take 600 mg of elemental calcium by mouth daily with breakfast.   Yes [provider]  Cholecalciferol 5000 units TABS Take 5,000 Units by mouth daily.   Yes [provider]  cilostazol (PLETAL) 100 MG tablet TAKE 1 TABLET(100 MG) BY MOUTH TWICE DAILY Patient taking differently: Take 100 mg by  mouth 2 (two) times daily.  01/22/20  Yes Sherren Mocha, MD  dorzolamide-timolol (COSOPT) 22.3-6.8 MG/ML ophthalmic solution Place 1 drop into the right eye 2 (two) times daily.    Yes [provider]  IRON PO Take 1 tablet by mouth daily.   Yes [provider]  latanoprost (XALATAN) 0.005 % ophthalmic solution Place 1 drop into both eyes at bedtime.    Yes [provider]  omeprazole (PRILOSEC OTC) 20 MG tablet Take 20 mg by mouth daily.     Yes [provider]  SYMBICORT 80-4.5 MCG/ACT inhaler Inhale 2 puffs into the lungs 2 (two) times daily.  10/17/17  Yes [provider]  vitamin B-12 (CYANOCOBALAMIN) 1000 MCG tablet Take 1,000 mcg by mouth daily.   Yes [provider]  zinc gluconate 50 MG tablet Take 50 mg by mouth daily.   Yes [provider]  nitroGLYCERIN (NITROSTAT) 0.4 MG SL tablet Place 1 tablet (0.4 mg total) under the tongue every 5 (five) minutes as needed for chest pain. 12/12/19   Sherren Mocha, MD    Allergies as of 01/02/2020 - Review Complete 12/23/2019  Allergen Reaction Noted  . Aspirin Other (See Comments) 09/28/2010  Family History  Problem Relation Age of Onset  . Heart disease Other   . Arthritis Other   . Asthma Other   . Coronary artery disease Other   . Anesthesia problems Neg Hx   . Hypotension Neg Hx   . Malignant hyperthermia Neg Hx   . Pseudochol deficiency Neg Hx     Social History   Socioeconomic History  . Marital status: Married    Spouse name: Not on file  . Number of children: Not on file  . Years of education: GED  . Highest education level: Not on file  Occupational History  . Occupation: fulltime   Tobacco Use  . Smoking status: Former Smoker    Packs/day: 1.00    Years: 50.00    Pack years: 50.00    Types: Cigarettes, Cigars    Quit date: 06/07/2017    Years since quitting: 2.7  . Smokeless tobacco: Never Used  Vaping Use  . Vaping Use: Former  Substance and  Sexual Activity  . Alcohol use: No    Alcohol/week: 0.0 standard drinks  . Drug use: No  . Sexual activity: Yes  Other Topics Concern  . Not on file  Social History Narrative  . Not on file   Social Determinants of Health   Financial Resource Strain:   . Difficulty of Paying Living Expenses: Not on file  Food Insecurity:   . Worried About Charity fundraiser in the Last Year: Not on file  . Ran Out of Food in the Last Year: Not on file  Transportation Needs:   . Lack of Transportation (Medical): Not on file  . Lack of Transportation (Non-Medical): Not on file  Physical Activity:   . Days of Exercise per Week: Not on file  . Minutes of Exercise per Session: Not on file  Stress:   . Feeling of Stress : Not on file  Social Connections:   . Frequency of Communication with Friends and Family: Not on file  . Frequency of Social Gatherings with Friends and Family: Not on file  . Attends Religious Services: Not on file  . Active Member of Clubs or Organizations: Not on file  . Attends Archivist Meetings: Not on file  . Marital Status: Not on file  Intimate Partner Violence:   . Fear of Current or Ex-Partner: Not on file  . Emotionally Abused: Not on file  . Physically Abused: Not on file  . Sexually Abused: Not on file    Review of Systems: See HPI, otherwise negative ROS  Impression/Plan: Devin [name] is here for a colonoscopy to be performed for screening  Risks, benefits, limitations, imponderables and alternatives regarding colonoscopy have been reviewed with the patient. Questions have been answered. All parties agreeable.

## 2020-02-18 NOTE — Addendum Note (Signed)
Addendum  created 02/18/20 0847 by Jonna Munro, CRNA   Flowsheet accepted, Intraprocedure Flowsheets edited

## 2020-02-18 NOTE — Anesthesia Preprocedure Evaluation (Signed)
Anesthesia Evaluation  Patient identified by MRN, date of birth, ID band Patient awake    Reviewed: Allergy & Precautions, NPO status , Patient's Chart, lab work & pertinent test results  Airway Mallampati: II  TM Distance: >3 FB Neck ROM: Full    Dental  (+) Edentulous Upper, Edentulous Lower   Pulmonary shortness of breath and with exertion, asthma , former smoker,    Pulmonary exam normal breath sounds clear to auscultation       Cardiovascular Exercise Tolerance: Poor hypertension, Pt. on medications + CAD, + CABG and + Peripheral Vascular Disease  Normal cardiovascular exam Rhythm:Regular Rate:Normal  Left ventricle: The cavity size was at the upper limits of  normal. Wall thickness was normal. Systolic function was normal.  The estimated ejection fraction was in the range of 55% to 60%.  Wall motion was normal; there were no regional wall motion  abnormalities. Doppler parameters are consistent with abnormal  left ventricular relaxation (grade 1 diastolic dysfunction).  - Mitral valve: There was mild regurgitation.  - Left atrium: The atrium was mildly dilated.    Neuro/Psych TIA Neuromuscular disease CVA negative psych ROS   GI/Hepatic GERD  Medicated and Controlled,  Endo/Other  negative endocrine ROS  Renal/GU Renal InsufficiencyRenal disease     Musculoskeletal  (+) Arthritis ,   Abdominal   Peds  Hematology negative hematology ROS (+)   Anesthesia Other Findings   Reproductive/Obstetrics                            Anesthesia Physical Anesthesia Plan  ASA: III  Anesthesia Plan: General   Post-op Pain Management:    Induction: Intravenous  PONV Risk Score and Plan: TIVA  Airway Management Planned: Nasal Cannula, Natural Airway and Simple Face Mask  Additional Equipment:   Intra-op Plan:   Post-operative Plan:   Informed Consent: I have reviewed the  patients History and Physical, chart, labs and discussed the procedure including the risks, benefits and alternatives for the proposed anesthesia with the patient or authorized representative who has indicated his/her understanding and acceptance.     Dental advisory given  Plan Discussed with: CRNA and Surgeon  Anesthesia Plan Comments:         Anesthesia Quick Evaluation

## 2020-02-19 LAB — SURGICAL PATHOLOGY

## 2020-02-21 ENCOUNTER — Encounter (HOSPITAL_COMMUNITY): Payer: Self-pay | Admitting: Internal Medicine

## 2020-02-28 ENCOUNTER — Other Ambulatory Visit (HOSPITAL_COMMUNITY): Payer: Self-pay | Admitting: Cardiovascular Disease

## 2020-02-28 ENCOUNTER — Other Ambulatory Visit: Payer: Self-pay

## 2020-02-28 ENCOUNTER — Ambulatory Visit (HOSPITAL_COMMUNITY)
Admission: RE | Admit: 2020-02-28 | Discharge: 2020-02-28 | Disposition: A | Discharge status: Home / Self Care | Payer: Medicare Other | Source: Ambulatory Visit | Attending: Cardiovascular Disease | Admitting: Cardiovascular Disease

## 2020-02-28 DIAGNOSIS — I739 Peripheral vascular disease, unspecified: Secondary | ICD-10-CM

## 2020-04-07 ENCOUNTER — Ambulatory Visit: Payer: Medicare Other | Admitting: Cardiovascular Disease

## 2020-04-14 ENCOUNTER — Ambulatory Visit: Payer: Medicare Other | Admitting: Cardiovascular Disease

## 2020-04-14 ENCOUNTER — Other Ambulatory Visit: Payer: Self-pay

## 2020-04-14 ENCOUNTER — Encounter: Payer: Self-pay | Admitting: Cardiovascular Disease

## 2020-04-14 VITALS — BP 130/63 | HR 69 | Temp 97.3°F | Ht 64.0 in | Wt 166.2 lb

## 2020-04-14 DIAGNOSIS — I739 Peripheral vascular disease, unspecified: Secondary | ICD-10-CM | POA: Diagnosis not present

## 2020-04-14 DIAGNOSIS — E785 Hyperlipidemia, unspecified: Secondary | ICD-10-CM | POA: Diagnosis not present

## 2020-04-14 DIAGNOSIS — I2510 Atherosclerotic heart disease of native coronary artery without angina pectoris: Secondary | ICD-10-CM

## 2020-04-14 DIAGNOSIS — I10 Essential (primary) hypertension: Secondary | ICD-10-CM | POA: Diagnosis not present

## 2020-04-14 NOTE — Progress Notes (Signed)
Cardiology Office Note   Date:  04/14/2020   ID:  Devin [name], DOB 05-May-1946, MRN [id number]  PCP:  [name], MD  Cardiologist:  Dr. Burt Knack  No chief complaint on file.     History of Present Illness: Devin [name] is a 74 y.o. male who presents for  a follow-up visit  regarding peripheral arterial disease. He has known history of CAD s/p CABG, cardiac cath in 2007 showed patent grafts, hyperlipidemia and tobacco use. He is not diabetic.  He is status post bilateral femoropopliteal bypass.  Unfortunately, the right femoropopliteal bypass occluded  in 2020 and could not be salvaged.  Medical therapy was continued.  In spite of that, the patient denies any ulceration or gangrene.  He had vascular studies done in August ,2020 which showed normal ABI on the left and stable on the right at 0.42.  Duplex showed known occluded right femoral-popliteal bypass and patent femoral-popliteal bypass on the left. He has been doing well overall with no recent chest pain or shortness of breath.  He reports minimal bilateral leg pain with walking.  Most recent Doppler studies done in October showed an ABI of 0.52 on the right and 0.96 on the left.  The bypass on the left is patent.  Past Medical History:  Diagnosis Date  . Arthritis   . Asthma   . Carpal tunnel syndrome   . Chest pain, unspecified   . Chronic kidney disease    hx of kidney stones  . Coronary artery disease   . Coronary atherosclerosis of artery bypass graft   . GERD (gastroesophageal reflux disease)   . History of kidney stones   . HOH (hard of hearing)   . Hypertension   . 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 Other and unspecified hyperlipidemia   . Personal history of unspecified circulatory disease   . Shortness of breath   . Stroke ([place])   . TIA (transient ischemic attack)     Past Surgical History:  Procedure Laterality Date  . ABDOMINAL AORTOGRAM N/A  06/14/2017   Procedure: ABDOMINAL AORTOGRAM;  Surgeon: Wellington Hampshire, MD;  Location: [place] [facility];  Service: Cardiovascular;  Laterality: N/A;  . ABDOMINAL AORTOGRAM N/A 01/26/2018   Procedure: ABDOMINAL AORTOGRAM;  Surgeon: Elam Dutch, MD;  Location: [place] [facility];  Service: Cardiovascular;  Laterality: N/A;  . ABDOMINAL AORTOGRAM N/A 07/11/2018   Procedure: ABDOMINAL AORTOGRAM;  Surgeon: Marty Heck, MD;  Location: [place] [facility];  Service: Cardiovascular;  Laterality: N/A;  . BACK SURGERY     cervical disc  . CARDIAC CATHETERIZATION    . CARPAL TUNNEL RELEASE     blateral carpal tunnel  . COLONOSCOPY WITH PROPOFOL N/A 02/18/2020   Procedure: COLONOSCOPY WITH PROPOFOL;  Surgeon: Eloise Harman, DO;  [facility];  Service: Endoscopy;  Laterality: N/A;  7:30  . CORONARY ARTERY BYPASS GRAFT     x5 12 yrs ago  . ENDARTERECTOMY FEMORAL Right 07/03/2017   Procedure: ENDARTERECTOMY FEMORAL RIGHT;  Surgeon: Elam Dutch, MD;  Location: [place];  Service: Vascular;  Laterality: Right;  . FEMORAL-POPLITEAL BYPASS GRAFT Right 07/03/2017   Procedure: BYPASS GRAFT FEMORAL-POPLITEAL ARTERY;  Surgeon: Elam Dutch, MD;  Location: [place];  Service: Vascular;  Laterality: Right;  . FEMORAL-POPLITEAL BYPASS GRAFT Left 02/12/2018   Procedure: BYPASS GRAFT FEMORAL-POPLITEAL ARTERY LEFT LEG;  Surgeon: Elam Dutch, MD;  [facility];  Service: Vascular;  Laterality: Left;  . LOWER EXTREMITY ANGIOGRAM Bilateral 08/05/2015   Procedure: Lower Extremity Angiogram;  Surgeon: Wellington Hampshire, MD;  Location: [place] [facility];  Service: Cardiovascular;  Laterality: Bilateral;  . LOWER EXTREMITY ANGIOGRAPHY Bilateral 06/14/2017   Procedure: Lower Extremity Angiography;  Surgeon: Wellington Hampshire, MD;  Location: [place] [facility];  Service: Cardiovascular;  Laterality: Bilateral;  . LOWER EXTREMITY ANGIOGRAPHY Bilateral 01/26/2018   Procedure: LOWER EXTREMITY  ANGIOGRAPHY;  Surgeon: Elam Dutch, MD;  Location: [place] [facility];  Service: Cardiovascular;  Laterality: Bilateral;  . LOWER EXTREMITY ANGIOGRAPHY Right 07/11/2018   Procedure: LOWER EXTREMITY ANGIOGRAPHY;  Surgeon: Marty Heck, MD;  Location: [place] [facility];  Service: Cardiovascular;  Laterality: Right;  thrombolysis fem/tib bypass graft  . NECK SURGERY    . PATCH ANGIOPLASTY Right 07/03/2017   Procedure: VEIN PATCH ANGIOPLASTY OF FEMORAL AND POSTERIOR TIBIAL ARTERIES;  Surgeon: Elam Dutch, MD;  Location: [place];  Service: Vascular;  Laterality: Right;  . PERIPHERAL VASCULAR BALLOON ANGIOPLASTY Right 07/12/2018   Procedure: PERIPHERAL VASCULAR BALLOON ANGIOPLASTY;  Surgeon: Marty Heck, MD;  Location: [place] [facility];  Service: Cardiovascular;  Laterality: Right;  Right fem posterior tibial graft and posterior tibial artery.  Marland Kitchen PERIPHERAL VASCULAR BALLOON ANGIOPLASTY Right 07/13/2018   Procedure: PERIPHERAL VASCULAR BALLOON ANGIOPLASTY;  Surgeon: Elam Dutch, MD;  Location: [place] [facility];  Service: Cardiovascular;  Laterality: Right;  PT  . PERIPHERAL VASCULAR CATHETERIZATION N/A 08/05/2015   Procedure: Abdominal Aortogram;  Surgeon: Wellington Hampshire, MD;  Location: [place] [facility];  Service: Cardiovascular;  Laterality: N/A;  . PERIPHERAL VASCULAR THROMBECTOMY N/A 07/12/2018   Procedure: PERIPHERAL VASCULAR THROMBECTOMY - LYSIS RECHECK;  Surgeon: Marty Heck, MD;  Location: [place] [facility];  Service: Cardiovascular;  Laterality: N/A;  . PERIPHERAL VASCULAR THROMBECTOMY Right 07/13/2018   Procedure: PERIPHERAL VASCULAR THROMBECTOMY;  Surgeon: Elam Dutch, MD;  Location: [place] [facility];  Service: Cardiovascular;  Laterality: Right;  . POLYPECTOMY  02/18/2020   Procedure: POLYPECTOMY;  Surgeon: Eloise Harman, DO;  [facility];  Service: Endoscopy;;  . SHOULDER ARTHROSCOPY W/ ROTATOR CUFF REPAIR     Right  . VEIN  HARVEST Left 02/12/2018   Procedure: VEIN HARVEST;  Surgeon: Elam Dutch, MD;  Location: [facility] OR;  Service: Vascular;  Laterality: Left;     Current Outpatient Medications  Medication Sig Dispense Refill  . acidophilus (RISAQUAD) CAPS capsule Take 1 capsule by mouth daily.    Marland Kitchen albuterol (PROVENTIL HFA;VENTOLIN HFA) 108 (90 Base) MCG/ACT inhaler Inhale 2 puffs into the lungs every 6 (six) hours as needed for wheezing or shortness of breath.    Marland Kitchen amLODipine (NORVASC) 5 MG tablet TAKE 1 TABLET(5 MG) BY MOUTH DAILY (Patient taking differently: Take 5 mg by mouth daily. ) 90 tablet 3  . aspirin (ASPIRIN LOW DOSE) 81 MG EC tablet Take 81 mg by mouth daily.      Marland Kitchen atorvastatin (LIPITOR) 40 MG tablet TAKE 1 TABLET(40 MG) BY MOUTH DAILY (Patient taking differently: Take 40 mg by mouth daily. ) 90 tablet 3  . calcium carbonate (OSCAL) 1500 (600 Ca) MG TABS tablet Take 600 mg of elemental calcium by mouth daily with breakfast.    . Cholecalciferol 5000 units TABS Take 5,000 Units by mouth daily.    . cilostazol (PLETAL) 100 MG tablet TAKE 1 TABLET(100 MG) BY MOUTH TWICE DAILY (  Patient taking differently: Take 100 mg by mouth 2 (two) times daily. ) 180 tablet 3  . dorzolamide-timolol (COSOPT) 22.3-6.8 MG/ML ophthalmic solution Place 1 drop into the right eye 2 (two) times daily.     . IRON PO Take 1 tablet by mouth daily.    Marland Kitchen latanoprost (XALATAN) 0.005 % ophthalmic solution Place 1 drop into both eyes at bedtime.     . nitroGLYCERIN (NITROSTAT) 0.4 MG SL tablet Place 1 tablet (0.4 mg total) under the tongue every 5 (five) minutes as needed for chest pain. 25 tablet 6  . omeprazole (PRILOSEC OTC) 20 MG tablet Take 20 mg by mouth daily.      . SYMBICORT 80-4.5 MCG/ACT inhaler Inhale 2 puffs into the lungs 2 (two) times daily.     . vitamin B-12 (CYANOCOBALAMIN) 1000 MCG tablet Take 1,000 mcg by mouth daily.    Marland Kitchen zinc gluconate 50 MG tablet Take 50 mg by mouth daily.     No current  facility-administered medications for this visit.    Allergies:   Aspirin    Social History:  The patient  reports that he quit smoking about 2 years ago. His smoking use included cigarettes and cigars. He has a 50.00 pack-year smoking history. He has never used smokeless tobacco. He reports that he does not drink alcohol and does not use drugs.   Family History:  The patient's family history includes Arthritis in an other family member; Asthma in an other family member; Coronary artery disease in an other family member; Heart disease in an other family member.    ROS:  Please see the history of present illness.   Otherwise, review of systems are positive for none.   All other systems are reviewed and negative.    PHYSICAL EXAM: VS:  BP 130/63   Pulse 69   Temp (!) 97.3 F (36.3 C)   Ht 5\' 4"  (1.626 m)   Wt 166 lb 3.2 oz (75.4 kg)   SpO2 96%   BMI 28.53 kg/m  , BMI Body mass index is 28.53 kg/m. GEN: Well nourished, well developed, in no acute distress  HEENT: normal  Neck: no JVD, carotid bruits, or masses Cardiac: RRR with premature beats; no murmurs, rubs, or gallops, mild bilateral leg edema Respiratory:  clear to auscultation bilaterally, normal work of breathing GI: soft, nontender, nondistended, + BS MS: no deformity or atrophy  Skin: warm and dry, no rash Neuro:  Strength and sensation are intact Psych: euthymic mood, full affect    EKG:  EKG is  Not ordered today.    Recent Labs: No results found for requested labs within last 8760 hours.    Lipid Panel No results found for: CHOL, TRIG, HDL, CHOLHDL, VLDL, LDLCALC, LDLDIRECT    Wt Readings from Last 3 Encounters:  04/14/20 166 lb 3.2 oz (75.4 kg)  12/23/19 161 lb 12.8 oz (73.4 kg)  12/12/19 161 lb (73 kg)      Other studies Reviewed: Additional studies/ records that were reviewed today include:n/a  No flowsheet data found.    ASSESSMENT AND PLAN:  1. PAD : Status post bilateral femoral  popliteal bypass but with occluded bypass on the right side.  The patient has minimal claudication at the present time and there is no evidence of critical limb ischemia.  Continue medical therapy with aspirin and cilostazol.    2. CAD status post CABG: The patient is stable and has no symptoms of angina. Continue aspirin daily.  3. Hyperlipidemia: Continue Atorvastatin with a target LDL <70.  Most recent lipid profile with his primary care physician showed an LDL of 43 and triglyceride of 45.  4. Tobacco abuse: He quit smoking in 2019.    5.  Essential hypertension: Blood pressure is well controlled on current medications.    Disposition:   FU with me in 12 months.   Signed,  [name], MD  04/14/2020 1:34 PM    [facility]

## 2020-04-14 NOTE — Patient Instructions (Signed)
Medication Instructions:  No changes *If you need a refill on your cardiac medications before your next appointment, please call your pharmacy*   Lab Work: None ordered If you have labs (blood work) drawn today and your tests are completely normal, you will receive your results only by: Marland Kitchen MyChart Message (if you have MyChart) OR . A paper copy in the mail If you have any lab test that is abnormal or we need to change your treatment, we will call you to review the results.   Testing/Procedures: None ordered   Follow-Up: At [facility], you and your health needs are our priority.  As part of our continuing mission to provide you with exceptional heart care, we have created designated Provider Care Teams.  These Care Teams include your primary Cardiologist (physician) and Advanced Practice Providers (APPs -  Physician Assistants and Nurse Practitioners) who all work together to provide you with the care you need, when you need it.  We recommend signing up for the patient portal called "MyChart".  Sign up information is provided on this After Visit Summary.  MyChart is used to connect with patients for Virtual Visits (Telemedicine).  Patients are able to view lab/test results, encounter notes, upcoming appointments, etc.  Non-urgent messages can be sent to your provider as well.   To learn more about what you can do with MyChart, go to [email or web address].    Your next appointment:   12 month(s)  The format for your next appointment:   In Person  Provider:   [name], MD   Other Instructions None

## 2020-07-20 ENCOUNTER — Other Ambulatory Visit: Payer: Self-pay | Admitting: Cardiovascular Disease

## 2021-01-16 ENCOUNTER — Other Ambulatory Visit: Payer: Self-pay | Admitting: Cardiovascular Disease

## 2021-01-19 ENCOUNTER — Other Ambulatory Visit: Payer: Self-pay

## 2021-01-19 ENCOUNTER — Inpatient Hospital Stay (HOSPITAL_COMMUNITY)
Admission: EM | Admit: 2021-01-19 | Discharge: 2021-01-28 | DRG: 871 | Disposition: E | Payer: Medicare Other | Attending: Internal Medicine | Admitting: Internal Medicine

## 2021-01-19 ENCOUNTER — Emergency Department (HOSPITAL_COMMUNITY): Payer: Medicare Other

## 2021-01-19 DIAGNOSIS — E871 Hypo-osmolality and hyponatremia: Secondary | ICD-10-CM | POA: Diagnosis present

## 2021-01-19 DIAGNOSIS — I5021 Acute systolic (congestive) heart failure: Secondary | ICD-10-CM | POA: Diagnosis present

## 2021-01-19 DIAGNOSIS — R0602 Shortness of breath: Secondary | ICD-10-CM

## 2021-01-19 DIAGNOSIS — N179 Acute kidney failure, unspecified: Secondary | ICD-10-CM | POA: Diagnosis not present

## 2021-01-19 DIAGNOSIS — G9340 Encephalopathy, unspecified: Secondary | ICD-10-CM | POA: Diagnosis present

## 2021-01-19 DIAGNOSIS — J440 Chronic obstructive pulmonary disease with acute lower respiratory infection: Secondary | ICD-10-CM | POA: Diagnosis present

## 2021-01-19 DIAGNOSIS — R6520 Severe sepsis without septic shock: Secondary | ICD-10-CM | POA: Diagnosis not present

## 2021-01-19 DIAGNOSIS — Z7189 Other specified counseling: Secondary | ICD-10-CM | POA: Diagnosis not present

## 2021-01-19 DIAGNOSIS — Z515 Encounter for palliative care: Secondary | ICD-10-CM | POA: Diagnosis not present

## 2021-01-19 DIAGNOSIS — Z7951 Long term (current) use of inhaled steroids: Secondary | ICD-10-CM

## 2021-01-19 DIAGNOSIS — Z66 Do not resuscitate: Secondary | ICD-10-CM | POA: Diagnosis present

## 2021-01-19 DIAGNOSIS — E785 Hyperlipidemia, unspecified: Secondary | ICD-10-CM

## 2021-01-19 DIAGNOSIS — G9341 Metabolic encephalopathy: Secondary | ICD-10-CM | POA: Diagnosis not present

## 2021-01-19 DIAGNOSIS — Z7982 Long term (current) use of aspirin: Secondary | ICD-10-CM

## 2021-01-19 DIAGNOSIS — I6529 Occlusion and stenosis of unspecified carotid artery: Secondary | ICD-10-CM | POA: Diagnosis present

## 2021-01-19 DIAGNOSIS — Z7902 Long term (current) use of antithrombotics/antiplatelets: Secondary | ICD-10-CM

## 2021-01-19 DIAGNOSIS — Z825 Family history of asthma and other chronic lower respiratory diseases: Secondary | ICD-10-CM

## 2021-01-19 DIAGNOSIS — Z951 Presence of aortocoronary bypass graft: Secondary | ICD-10-CM

## 2021-01-19 DIAGNOSIS — J441 Chronic obstructive pulmonary disease with (acute) exacerbation: Secondary | ICD-10-CM | POA: Diagnosis present

## 2021-01-19 DIAGNOSIS — I21A1 Myocardial infarction type 2: Secondary | ICD-10-CM | POA: Diagnosis not present

## 2021-01-19 DIAGNOSIS — I214 Non-ST elevation (NSTEMI) myocardial infarction: Secondary | ICD-10-CM | POA: Diagnosis present

## 2021-01-19 DIAGNOSIS — H9190 Unspecified hearing loss, unspecified ear: Secondary | ICD-10-CM | POA: Diagnosis present

## 2021-01-19 DIAGNOSIS — I10 Essential (primary) hypertension: Secondary | ICD-10-CM | POA: Diagnosis not present

## 2021-01-19 DIAGNOSIS — I2510 Atherosclerotic heart disease of native coronary artery without angina pectoris: Secondary | ICD-10-CM | POA: Diagnosis present

## 2021-01-19 DIAGNOSIS — R0609 Other forms of dyspnea: Secondary | ICD-10-CM | POA: Diagnosis not present

## 2021-01-19 DIAGNOSIS — N189 Chronic kidney disease, unspecified: Secondary | ICD-10-CM | POA: Diagnosis present

## 2021-01-19 DIAGNOSIS — E878 Other disorders of electrolyte and fluid balance, not elsewhere classified: Secondary | ICD-10-CM | POA: Diagnosis present

## 2021-01-19 DIAGNOSIS — E870 Hyperosmolality and hypernatremia: Secondary | ICD-10-CM

## 2021-01-19 DIAGNOSIS — I130 Hypertensive heart and chronic kidney disease with heart failure and stage 1 through stage 4 chronic kidney disease, or unspecified chronic kidney disease: Secondary | ICD-10-CM | POA: Diagnosis present

## 2021-01-19 DIAGNOSIS — D696 Thrombocytopenia, unspecified: Secondary | ICD-10-CM | POA: Diagnosis present

## 2021-01-19 DIAGNOSIS — D649 Anemia, unspecified: Secondary | ICD-10-CM | POA: Diagnosis present

## 2021-01-19 DIAGNOSIS — A419 Sepsis, unspecified organism: Secondary | ICD-10-CM | POA: Diagnosis not present

## 2021-01-19 DIAGNOSIS — Z20822 Contact with and (suspected) exposure to covid-19: Secondary | ICD-10-CM | POA: Diagnosis present

## 2021-01-19 DIAGNOSIS — Z8249 Family history of ischemic heart disease and other diseases of the circulatory system: Secondary | ICD-10-CM

## 2021-01-19 DIAGNOSIS — E860 Dehydration: Secondary | ICD-10-CM | POA: Diagnosis present

## 2021-01-19 DIAGNOSIS — J189 Pneumonia, unspecified organism: Secondary | ICD-10-CM | POA: Diagnosis not present

## 2021-01-19 DIAGNOSIS — I468 Cardiac arrest due to other underlying condition: Secondary | ICD-10-CM | POA: Diagnosis not present

## 2021-01-19 DIAGNOSIS — I739 Peripheral vascular disease, unspecified: Secondary | ICD-10-CM | POA: Diagnosis present

## 2021-01-19 DIAGNOSIS — K219 Gastro-esophageal reflux disease without esophagitis: Secondary | ICD-10-CM | POA: Diagnosis present

## 2021-01-19 DIAGNOSIS — R0600 Dyspnea, unspecified: Secondary | ICD-10-CM

## 2021-01-19 DIAGNOSIS — I110 Hypertensive heart disease with heart failure: Secondary | ICD-10-CM | POA: Diagnosis present

## 2021-01-19 DIAGNOSIS — Z79899 Other long term (current) drug therapy: Secondary | ICD-10-CM

## 2021-01-19 DIAGNOSIS — E876 Hypokalemia: Secondary | ICD-10-CM

## 2021-01-19 DIAGNOSIS — J9601 Acute respiratory failure with hypoxia: Secondary | ICD-10-CM | POA: Diagnosis present

## 2021-01-19 DIAGNOSIS — R739 Hyperglycemia, unspecified: Secondary | ICD-10-CM | POA: Diagnosis present

## 2021-01-19 DIAGNOSIS — Z8673 Personal history of transient ischemic attack (TIA), and cerebral infarction without residual deficits: Secondary | ICD-10-CM

## 2021-01-19 DIAGNOSIS — Z87891 Personal history of nicotine dependence: Secondary | ICD-10-CM

## 2021-01-19 DIAGNOSIS — Z886 Allergy status to analgesic agent status: Secondary | ICD-10-CM

## 2021-01-19 LAB — BLOOD GAS, VENOUS
Acid-Base Excess: 1.7 mmol/L (ref 0.0–2.0)
Bicarbonate: 26.1 mmol/L (ref 20.0–28.0)
FIO2: 21
O2 Saturation: 97.2 %
Patient temperature: 36.8
pCO2, Ven: 36.8 mmHg — ABNORMAL LOW (ref 44.0–60.0)
pH, Ven: 7.452 — ABNORMAL HIGH (ref 7.250–7.430)
pO2, Ven: 91.1 mmHg — ABNORMAL HIGH (ref 32.0–45.0)

## 2021-01-19 LAB — CBC WITH DIFFERENTIAL/PLATELET
Abs Immature Granulocytes: 0.19 10*3/uL — ABNORMAL HIGH (ref 0.00–0.07)
Basophils Absolute: 0.1 10*3/uL (ref 0.0–0.1)
Basophils Relative: 1 %
Eosinophils Absolute: 0.1 10*3/uL (ref 0.0–0.5)
Eosinophils Relative: 1 %
HCT: 40.7 % (ref 39.0–52.0)
Hemoglobin: 13.8 g/dL (ref 13.0–17.0)
Immature Granulocytes: 3 %
Lymphocytes Relative: 21 %
Lymphs Abs: 1.5 10*3/uL (ref 0.7–4.0)
MCH: 32.5 pg (ref 26.0–34.0)
MCHC: 33.9 g/dL (ref 30.0–36.0)
MCV: 95.8 fL (ref 80.0–100.0)
Monocytes Absolute: 0.9 10*3/uL (ref 0.1–1.0)
Monocytes Relative: 12 %
Neutro Abs: 4.5 10*3/uL (ref 1.7–7.7)
Neutrophils Relative %: 62 %
Platelets: 30 10*3/uL — ABNORMAL LOW (ref 150–400)
RBC: 4.25 MIL/uL (ref 4.22–5.81)
RDW: 14 % (ref 11.5–15.5)
WBC: 7.2 10*3/uL (ref 4.0–10.5)
nRBC: 0.4 % — ABNORMAL HIGH (ref 0.0–0.2)

## 2021-01-19 LAB — PROTIME-INR
INR: 1.3 — ABNORMAL HIGH (ref 0.8–1.2)
Prothrombin Time: 15.7 seconds — ABNORMAL HIGH (ref 11.4–15.2)

## 2021-01-19 LAB — RESP PANEL BY RT-PCR (FLU A&B, COVID) ARPGX2
Influenza A by PCR: NEGATIVE
Influenza B by PCR: NEGATIVE
SARS Coronavirus 2 by RT PCR: NEGATIVE

## 2021-01-19 LAB — COMPREHENSIVE METABOLIC PANEL
ALT: 30 U/L (ref 0–44)
AST: 42 U/L — ABNORMAL HIGH (ref 15–41)
Albumin: 3.9 g/dL (ref 3.5–5.0)
Alkaline Phosphatase: 166 U/L — ABNORMAL HIGH (ref 38–126)
Anion gap: 11 (ref 5–15)
BUN: 34 mg/dL — ABNORMAL HIGH (ref 8–23)
CO2: 25 mmol/L (ref 22–32)
Calcium: 11.9 mg/dL — ABNORMAL HIGH (ref 8.9–10.3)
Chloride: 113 mmol/L — ABNORMAL HIGH (ref 98–111)
Creatinine, Ser: 1.09 mg/dL (ref 0.61–1.24)
GFR, Estimated: >60 mL/min (ref >60)
Glucose, Bld: 112 mg/dL — ABNORMAL HIGH (ref 70–99)
Potassium: 3.3 mmol/L — ABNORMAL LOW (ref 3.5–5.1)
Sodium: 149 mmol/L — ABNORMAL HIGH (ref 135–145)
Total Bilirubin: 1.5 mg/dL — ABNORMAL HIGH (ref 0.3–1.2)
Total Protein: 7.9 g/dL (ref 6.5–8.1)

## 2021-01-19 LAB — LACTIC ACID, PLASMA
Lactic Acid, Venous: 1.8 mmol/L (ref 0.5–1.9)
Lactic Acid, Venous: 1.5 mmol/L (ref 0.5–1.9)

## 2021-01-19 LAB — CK: Total CK: 330 U/L (ref 49–397)

## 2021-01-19 LAB — LIPASE, BLOOD: Lipase: 34 U/L (ref 11–51)

## 2021-01-19 LAB — TROPONIN I (HIGH SENSITIVITY)
Troponin I (High Sensitivity): 39 ng/L — ABNORMAL HIGH (ref <18)
Troponin I (High Sensitivity): 41 ng/L — ABNORMAL HIGH (ref <18)

## 2021-01-19 MED ORDER — ALBUTEROL SULFATE (2.5 MG/3ML) 0.083% IN NEBU: 2.5000 mg | INHALATION_SOLUTION | Freq: Four times a day (QID) | RESPIRATORY_TRACT | Status: DC | PRN

## 2021-01-19 MED ORDER — FERROUS SULFATE 325 (65 FE) MG PO TABS
325.0000 mg | ORAL_TABLET | Freq: Every day | ORAL | Status: DC
  Administered 2021-01-20: 325 mg via ORAL
  Filled 2021-01-19 (×4): qty 1

## 2021-01-19 MED ORDER — SODIUM CHLORIDE 0.9 % IV BOLUS
1000.0000 mL | Freq: Once | INTRAVENOUS | Status: AC
  Administered 2021-01-19: 1000 mL via INTRAVENOUS

## 2021-01-19 MED ORDER — CEFTRIAXONE SODIUM 1 G IJ SOLR
1.0000 g | INTRAMUSCULAR | Status: DC
  Administered 2021-01-20: 1 g via INTRAVENOUS
  Filled 2021-01-19: qty 10

## 2021-01-19 MED ORDER — SODIUM CHLORIDE 0.9 % IV SOLN
1.0000 g | Freq: Once | INTRAVENOUS | Status: AC
  Administered 2021-01-19: 1 g via INTRAVENOUS
  Filled 2021-01-19: qty 10

## 2021-01-19 MED ORDER — IRON 18 MG PO TBCR: EXTENDED_RELEASE_TABLET | Freq: Every day | ORAL | Status: DC

## 2021-01-19 MED ORDER — POTASSIUM CHLORIDE 2 MEQ/ML IV SOLN: INTRAVENOUS | Status: DC

## 2021-01-19 MED ORDER — MOMETASONE FURO-FORMOTEROL FUM 200-5 MCG/ACT IN AERO
2.0000 | INHALATION_SPRAY | Freq: Two times a day (BID) | RESPIRATORY_TRACT | Status: DC
  Administered 2021-01-20 – 2021-01-21 (×4): 2 via RESPIRATORY_TRACT
  Filled 2021-01-19 (×2): qty 8.8

## 2021-01-19 MED ORDER — METHYLPREDNISOLONE SODIUM SUCC 125 MG IJ SOLR
125.0000 mg | INTRAMUSCULAR | Status: DC
  Administered 2021-01-20: 125 mg via INTRAVENOUS
  Filled 2021-01-19: qty 2

## 2021-01-19 MED ORDER — POTASSIUM CHLORIDE 10 MEQ/100ML IV SOLN
10.0000 meq | INTRAVENOUS | Status: AC
  Administered 2021-01-19 – 2021-01-20 (×3): 10 meq via INTRAVENOUS
  Filled 2021-01-19 (×3): qty 100

## 2021-01-19 MED ORDER — LACTATED RINGERS IV BOLUS: 1000.0000 mL | Freq: Once | INTRAVENOUS | Status: DC

## 2021-01-19 MED ORDER — AMLODIPINE BESYLATE 5 MG PO TABS
10.0000 mg | ORAL_TABLET | Freq: Every day | ORAL | Status: DC
  Administered 2021-01-20: 10 mg via ORAL
  Filled 2021-01-19 (×4): qty 2

## 2021-01-19 MED ORDER — IPRATROPIUM-ALBUTEROL 0.5-2.5 (3) MG/3ML IN SOLN
3.0000 mL | Freq: Four times a day (QID) | RESPIRATORY_TRACT | Status: DC
  Administered 2021-01-19 – 2021-01-20 (×5): 3 mL via RESPIRATORY_TRACT
  Filled 2021-01-19 (×6): qty 3

## 2021-01-19 MED ORDER — METHYLPREDNISOLONE SODIUM SUCC 125 MG IJ SOLR
125.0000 mg | Freq: Once | INTRAMUSCULAR | Status: AC
  Administered 2021-01-19: 125 mg via INTRAVENOUS
  Filled 2021-01-19: qty 2

## 2021-01-19 MED ORDER — SODIUM CHLORIDE 0.9 % IV SOLN
500.0000 mg | INTRAVENOUS | Status: DC
  Administered 2021-01-19 – 2021-01-22 (×4): 500 mg via INTRAVENOUS
  Filled 2021-01-19 (×4): qty 500

## 2021-01-19 MED ORDER — LACTATED RINGERS IV SOLN: INTRAVENOUS | Status: DC

## 2021-01-19 MED ORDER — POTASSIUM CHLORIDE 2 MEQ/ML IV SOLN
INTRAVENOUS | Status: DC
  Filled 2021-01-19 (×2): qty 1000

## 2021-01-19 MED ORDER — VITAMIN B-12 1000 MCG PO TABS
1000.0000 ug | ORAL_TABLET | Freq: Every day | ORAL | Status: DC
  Administered 2021-01-20: 1000 ug via ORAL
  Filled 2021-01-19 (×4): qty 1

## 2021-01-19 MED ORDER — ALBUTEROL SULFATE HFA 108 (90 BASE) MCG/ACT IN AERS: 2.0000 | INHALATION_SPRAY | Freq: Four times a day (QID) | RESPIRATORY_TRACT | Status: DC | PRN

## 2021-01-19 MED ORDER — CILOSTAZOL 100 MG PO TABS
100.0000 mg | ORAL_TABLET | Freq: Two times a day (BID) | ORAL | Status: DC
  Administered 2021-01-19: 100 mg via ORAL
  Filled 2021-01-19: qty 1

## 2021-01-19 MED ORDER — HEPARIN SODIUM (PORCINE) 5000 UNIT/ML IJ SOLN
5000.0000 [IU] | Freq: Three times a day (TID) | INTRAMUSCULAR | Status: DC
  Administered 2021-01-19: 5000 [IU] via SUBCUTANEOUS
  Filled 2021-01-19 (×2): qty 1

## 2021-01-19 MED ORDER — ATORVASTATIN CALCIUM 20 MG PO TABS
20.0000 mg | ORAL_TABLET | Freq: Every day | ORAL | Status: DC
  Administered 2021-01-20: 20 mg via ORAL
  Filled 2021-01-19 (×4): qty 1

## 2021-01-19 MED ORDER — ALBUTEROL SULFATE (2.5 MG/3ML) 0.083% IN NEBU
2.5000 mg | INHALATION_SOLUTION | Freq: Once | RESPIRATORY_TRACT | Status: AC
  Administered 2021-01-19: 2.5 mg via RESPIRATORY_TRACT
  Filled 2021-01-19: qty 3

## 2021-01-19 NOTE — ED Provider Notes (Signed)
[facility] EMERGENCY DEPARTMENT Provider Note   CSN: [id number] Arrival date & time: 12/31/2020  1640     History Chief Complaint  Patient presents with   Altered Mental Status    Devin [name] is a 75 y.o. male.  HPI  Will defer HPI due to level 5 caveat altered mental status.  Patient with significant medical history of asthma, arthritis, CAD, GERD, TIA, stroke, COPD presents to the emergency department due to altered mental status.  Wife was at bedside was able to provide HPI.  She endorsed that yesterday patient appeared to be more lethargic, states that he seemed weak and a bit confused.  She notes that patient went to sit on the couch and unfortunately slid down onto the floor and she was unable to pick him up.  She states that the patient laid on the floor for 12 hours until EMS arrived.  She denies  recent head trauma, is not on anticoagulant, she denies the patient endorsing headaches, fevers, chills, cough, chest pain, shortness of breath, abdominal pain, nausea, vomiting, or  urinary symptoms.  She does note that patient appears to be having a hard time breathing today, she also notes that the patient has a decreased appetite which has been going on for last couple of days.  Past Medical History:  Diagnosis Date   Arthritis    Asthma    Carpal tunnel syndrome    Chest pain, unspecified    Chronic kidney disease    hx of kidney stones   Coronary artery disease    Coronary atherosclerosis of artery bypass graft    GERD (gastroesophageal reflux disease)    History of kidney stones    HOH (hard of hearing)    Hypertension    Occlusion and stenosis of carotid artery without mention of cerebral infarction    Other and unspecified hyperlipidemia    Personal history of unspecified circulatory disease    Shortness of breath    Stroke [facility])    TIA (transient ischemic attack)     Patient Active Problem List   Diagnosis Date Noted   CAP (community acquired pneumonia)  01/11/2021   Hypertension 01/22/2021   Hypernatremia 01/23/2021   Hypercalcemia 01/03/2021   Dehydration 01/20/2021   Hypokalemia 40/98/1191   Acute metabolic encephalopathy 47/82/9562   Critical lower limb ischemia ([place]) 07/11/2018   PAD (peripheral artery disease) ([place]) 07/03/2017   Carotid artery disease without cerebral infarction ([place]) 02/23/2012   S/P shoulder surgery 02/15/2011   Peripheral vascular disease with claudication ([place]) 02/09/2011   S/P arthroscopy of shoulder 01/10/2011   Shoulder arthritis 01/10/2011   Nontraumatic rupture of tendons of biceps (long head) 01/10/2011   Synovitis of shoulder 01/10/2011   Arthritis, shoulder region 12/30/2010   Biceps tendinopathy 12/30/2010   Rotator cuff tendonitis 12/30/2010   Shoulder pain 12/15/2010   Rotator cuff syndrome 12/15/2010   Rotator cuff tear, right 12/15/2010   Disorders of bursae and tendons in shoulder region, unspecified 11/09/2010   Hyperlipidemia 02/19/2009   CAD, ARTERY BYPASS GRAFT 02/19/2009   TRANSIENT ISCHEMIC ATTACKS, HX OF 02/19/2009   CARPAL TUNNEL SYNDROME 02/13/2008    Past Surgical History:  Procedure Laterality Date   ABDOMINAL AORTOGRAM N/A 06/14/2017   Procedure: ABDOMINAL AORTOGRAM;  Surgeon: Wellington Hampshire, MD;  Location: [place] [facility];  Service: Cardiovascular;  Laterality: N/A;   ABDOMINAL AORTOGRAM N/A 01/26/2018   Procedure: ABDOMINAL AORTOGRAM;  Surgeon: Elam Dutch, MD;  Location: [place] [facility];  Service: Cardiovascular;  Laterality: N/A;   ABDOMINAL AORTOGRAM N/A 07/11/2018   Procedure: ABDOMINAL AORTOGRAM;  Surgeon: Marty Heck, MD;  Location: [place] [facility];  Service: Cardiovascular;  Laterality: N/A;   BACK SURGERY     cervical disc   CARDIAC CATHETERIZATION     CARPAL TUNNEL RELEASE     blateral carpal tunnel   COLONOSCOPY WITH PROPOFOL N/A 02/18/2020   Procedure: COLONOSCOPY WITH PROPOFOL;  Surgeon: Eloise Harman, DO;  [facility];   Service: Endoscopy;  Laterality: N/A;  7:30   CORONARY ARTERY BYPASS GRAFT     x5 12 yrs ago   ENDARTERECTOMY FEMORAL Right 07/03/2017   Procedure: ENDARTERECTOMY FEMORAL RIGHT;  Surgeon: Elam Dutch, MD;  Location: [facility] OR;  Service: Vascular;  Laterality: Right;   FEMORAL-POPLITEAL BYPASS GRAFT Right 07/03/2017   Procedure: BYPASS GRAFT FEMORAL-POPLITEAL ARTERY;  Surgeon: Elam Dutch, MD;  Location: [facility] OR;  Service: Vascular;  Laterality: Right;   FEMORAL-POPLITEAL BYPASS GRAFT Left 02/12/2018   Procedure: BYPASS GRAFT FEMORAL-POPLITEAL ARTERY LEFT LEG;  Surgeon: Elam Dutch, MD;  Location: [place];  Service: Vascular;  Laterality: Left;   LOWER EXTREMITY ANGIOGRAM Bilateral 08/05/2015   Procedure: Lower Extremity Angiogram;  Surgeon: Wellington Hampshire, MD;  Location: [place] [facility];  Service: Cardiovascular;  Laterality: Bilateral;   LOWER EXTREMITY ANGIOGRAPHY Bilateral 06/14/2017   Procedure: Lower Extremity Angiography;  Surgeon: Wellington Hampshire, MD;  Location: [place] [facility];  Service: Cardiovascular;  Laterality: Bilateral;   LOWER EXTREMITY ANGIOGRAPHY Bilateral 01/26/2018   Procedure: LOWER EXTREMITY ANGIOGRAPHY;  Surgeon: Elam Dutch, MD;  Location: [place] [facility];  Service: Cardiovascular;  Laterality: Bilateral;   LOWER EXTREMITY ANGIOGRAPHY Right 07/11/2018   Procedure: LOWER EXTREMITY ANGIOGRAPHY;  Surgeon: Marty Heck, MD;  Location: [place] [facility];  Service: Cardiovascular;  Laterality: Right;  thrombolysis fem/tib bypass graft   NECK SURGERY     PATCH ANGIOPLASTY Right 07/03/2017   Procedure: VEIN PATCH ANGIOPLASTY OF FEMORAL AND POSTERIOR TIBIAL ARTERIES;  Surgeon: Elam Dutch, MD;  Location: [place];  Service: Vascular;  Laterality: Right;   PERIPHERAL VASCULAR BALLOON ANGIOPLASTY Right 07/12/2018   Procedure: PERIPHERAL VASCULAR BALLOON ANGIOPLASTY;  Surgeon: Marty Heck, MD;  Location: [place] [facility];  Service:  Cardiovascular;  Laterality: Right;  Right fem posterior tibial graft and posterior tibial artery.   PERIPHERAL VASCULAR BALLOON ANGIOPLASTY Right 07/13/2018   Procedure: PERIPHERAL VASCULAR BALLOON ANGIOPLASTY;  Surgeon: Elam Dutch, MD;  Location: [place] [facility];  Service: Cardiovascular;  Laterality: Right;  PT   PERIPHERAL VASCULAR CATHETERIZATION N/A 08/05/2015   Procedure: Abdominal Aortogram;  Surgeon: Wellington Hampshire, MD;  Location: [place] [facility];  Service: Cardiovascular;  Laterality: N/A;   PERIPHERAL VASCULAR THROMBECTOMY N/A 07/12/2018   Procedure: PERIPHERAL VASCULAR THROMBECTOMY - LYSIS RECHECK;  Surgeon: Marty Heck, MD;  Location: [place] [facility];  Service: Cardiovascular;  Laterality: N/A;   PERIPHERAL VASCULAR THROMBECTOMY Right 07/13/2018   Procedure: PERIPHERAL VASCULAR THROMBECTOMY;  Surgeon: Elam Dutch, MD;  Location: [place] [facility];  Service: Cardiovascular;  Laterality: Right;   POLYPECTOMY  02/18/2020   Procedure: POLYPECTOMY;  Surgeon: Eloise Harman, DO;  [facility];  Service: Endoscopy;;   SHOULDER ARTHROSCOPY W/ ROTATOR CUFF REPAIR     Right   VEIN HARVEST Left 02/12/2018   Procedure: VEIN HARVEST;  Surgeon: Elam Dutch, MD;  Location: [place];  Service: Vascular;  Laterality: Left;       Family History  Problem Relation Age of Onset   Heart disease Other    Arthritis Other    Asthma Other    Coronary artery disease Other    Anesthesia problems Neg Hx    Hypotension Neg Hx    Malignant hyperthermia Neg Hx    Pseudochol deficiency Neg Hx     Social History   Tobacco Use   Smoking status: Former    Packs/day: 1.00    Years: 50.00    Pack years: 50.00    Types: Cigarettes, Cigars    Quit date: 06/07/2017    Years since quitting: 3.6   Smokeless tobacco: Never  Vaping Use   Vaping Use: Former  Substance Use Topics   Alcohol use: No    Alcohol/week: 0.0 standard drinks   Drug use: No    Home  Medications Prior to Admission medications   Medication Sig Start Date End Date Taking? Authorizing Provider  albuterol (PROVENTIL HFA;VENTOLIN HFA) 108 (90 Base) MCG/ACT inhaler Inhale 2 puffs into the lungs every 6 (six) hours as needed for wheezing or shortness of breath.   Yes [provider]  amLODipine (NORVASC) 5 MG tablet TAKE 1 TABLET(5 MG) BY MOUTH DAILY 07/20/20  Yes Sherren Mocha, MD  aspirin 81 MG EC tablet Take 81 mg by mouth daily.     Yes [provider]  atorvastatin (LIPITOR) 40 MG tablet TAKE 1 TABLET(40 MG) BY MOUTH DAILY 07/20/20  Yes Sherren Mocha, MD  calcium carbonate (OSCAL) 1500 (600 Ca) MG TABS tablet Take 600 mg of elemental calcium by mouth daily with breakfast.   Yes [provider]  Cholecalciferol 5000 units TABS Take 5,000 Units by mouth daily.   Yes [provider]  cilostazol (PLETAL) 100 MG tablet Take 1 tablet (100 mg total) by mouth 2 (two) times daily. Please keep upcoming 01/22/21 appointment for further refills 01/18/21  Yes Sherren Mocha, MD  dorzolamide-timolol (COSOPT) 22.3-6.8 MG/ML ophthalmic solution Place 1 drop into the right eye 2 (two) times daily.    Yes [provider]  IRON PO Take 1 tablet by mouth daily.   Yes [provider]  latanoprost (XALATAN) 0.005 % ophthalmic solution Place 1 drop into both eyes at bedtime.    Yes [provider]  nitroGLYCERIN (NITROSTAT) 0.4 MG SL tablet Place 1 tablet (0.4 mg total) under the tongue every 5 (five) minutes as needed for chest pain. 12/12/19  Yes Sherren Mocha, MD  omeprazole (PRILOSEC OTC) 20 MG tablet Take 20 mg by mouth daily.     Yes [provider]  potassium chloride (MICRO-K) 10 MEQ CR capsule Take 10 mEq by mouth daily. 12/29/20  Yes [provider]  SYMBICORT 160-4.5 MCG/ACT inhaler Inhale 2 puffs into the lungs 2 (two) times daily. 12/29/20  Yes [provider]  vitamin B-12 (CYANOCOBALAMIN) 1000 MCG  tablet Take 1,000 mcg by mouth daily.   Yes [provider]  zinc gluconate 50 MG tablet Take 50 mg by mouth daily.   Yes [provider]  acidophilus (RISAQUAD) CAPS capsule Take 1 capsule by mouth daily. Patient not taking: Reported on 01/25/2021    [provider]  SYMBICORT 80-4.5 MCG/ACT inhaler Inhale 2 puffs into the lungs 2 (two) times daily.  Patient not taking: Reported on 01/01/2021 10/17/17   [provider]    Allergies    Aspirin  Review of Systems   Review  of Systems  Unable to perform ROS: Mental status change   Physical Exam Updated Vital Signs BP (!) 166/96   Pulse 88   Temp 98.2 F (36.8 C) (Oral)   Resp 18   Ht _0  (1.626 m)   Wt 74.4 kg   SpO2 92%   BMI 28.15 kg/m   Physical Exam Vitals and nursing note reviewed.  Constitutional:      General: He is not in acute distress.    Appearance: He is ill-appearing.     Comments: Deconditioned state, appears acutely ill.  HENT:     Head: Normocephalic and atraumatic.     Nose: Congestion present.     Mouth/Throat:     Mouth: Mucous membranes are dry.     Pharynx: Oropharynx is clear. No oropharyngeal exudate or posterior oropharyngeal erythema.  Eyes:     Extraocular Movements: Extraocular movements intact.     Conjunctiva/sclera: Conjunctivae normal.     Pupils: Pupils are equal, round, and reactive to light.  Cardiovascular:     Rate and Rhythm: Regular rhythm. Tachycardia present.     Pulses: Normal pulses.     Heart sounds: No murmur heard.   No friction rub. No gallop.  Pulmonary:     Effort: No respiratory distress.     Breath sounds: No wheezing, rhonchi or rales.     Comments: Patient had notable rhonchi and slight bibasilar Rales in the left lower lobe, as well as intermittent wheezing worse in the right upper lobe versus the left. Abdominal:     Palpations: Abdomen is soft.     Tenderness: There is no abdominal tenderness. There is no right CVA tenderness  or left CVA tenderness.  Skin:    General: Skin is warm and dry.  Neurological:     Mental Status: He is alert.     Comments: Patient was oriented to self, was able to follow simple commands, no facial asymmetry, no slurring of his words, no unilateral weakness present.  Unable to assess gait due to altered mental status.  Psychiatric:        Mood and Affect: Mood normal.    ED Results / Procedures / Treatments   Labs (all labs ordered are listed, but only abnormal results are displayed) Labs Reviewed  COMPREHENSIVE METABOLIC PANEL - Abnormal; Notable for the following components:      Result Value   Sodium 149 (*)    Potassium 3.3 (*)    Chloride 113 (*)    Glucose, Bld 112 (*)    BUN 34 (*)    Calcium 11.9 (*)    AST 42 (*)    Alkaline Phosphatase 166 (*)    Total Bilirubin 1.5 (*)    All other components within normal limits  CBC WITH DIFFERENTIAL/PLATELET - Abnormal; Notable for the following components:   Platelets 30 (*)    nRBC 0.4 (*)    Abs Immature Granulocytes 0.19 (*)    All other components within normal limits  PROTIME-INR - Abnormal; Notable for the following components:   Prothrombin Time 15.7 (*)    INR 1.3 (*)    All other components within normal limits  BLOOD GAS, VENOUS - Abnormal; Notable for the following components:   pH, Ven 7.452 (*)    pCO2, Ven 36.8 (*)    pO2, Ven 91.1 (*)    All other components within normal limits  TROPONIN I (HIGH SENSITIVITY) - Abnormal; Notable for the following components:   Troponin I (  High Sensitivity) 39 (*)    All other components within normal limits  TROPONIN I (HIGH SENSITIVITY) - Abnormal; Notable for the following components:   Troponin I (High Sensitivity) 41 (*)    All other components within normal limits  CULTURE, BLOOD (ROUTINE X 2)  CULTURE, BLOOD (ROUTINE X 2)  RESP PANEL BY RT-PCR (FLU A&B, COVID) ARPGX2  LACTIC ACID, PLASMA  LACTIC ACID, PLASMA  CK  LIPASE, BLOOD  URINALYSIS, ROUTINE W REFLEX  MICROSCOPIC  HIV ANTIBODY (ROUTINE TESTING W REFLEX)  BASIC METABOLIC PANEL  LEGIONELLA PNEUMOPHILA SEROGP 1 UR AG  STREP PNEUMONIAE URINARY ANTIGEN  URINALYSIS, COMPLETE (UACMP) WITH MICROSCOPIC  CBC  COMPREHENSIVE METABOLIC PANEL    EKG None  Radiology CT HEAD WO CONTRAST (5MM)  Result Date: 01/23/2021 CLINICAL DATA:  Mental status changes EXAM: CT HEAD WITHOUT CONTRAST TECHNIQUE: Contiguous axial images were obtained from the base of the skull through the vertex without intravenous contrast. COMPARISON:  11/27/2001 FINDINGS: Brain: There is atrophy and chronic small vessel disease changes. No acute intracranial abnormality. Specifically, no hemorrhage, hydrocephalus, mass lesion, acute infarction, or significant intracranial injury. Vascular: No hyperdense vessel or unexpected calcification. Skull: No acute calvarial abnormality. Sinuses/Orbits: No acute findings Other: None IMPRESSION: Atrophy, chronic microvascular disease. No acute intracranial abnormality. Electronically Signed   By: Rolm Baptise M.D.   On: 01/13/2021 18:58   DG Chest Port 1 View  Result Date: 01/03/2021 CLINICAL DATA:  Cough. EXAM: PORTABLE CHEST 1 VIEW COMPARISON:  Chest x-ray dated July 14, 2018. FINDINGS: Stable cardiomediastinal silhouette status post CABG. Patchy opacities in the right upper lobe. No pleural effusion or pneumothorax. No acute osseous abnormality. IMPRESSION: 1. Right upper lobe pneumonia. Electronically Signed   By: Titus Dubin M.D.   On: 01/01/2021 18:10    Procedures Procedures   Medications Ordered in ED Medications  azithromycin (ZITHROMAX) 500 mg in sodium chloride 0.9 % 250 mL IVPB (0 mg Intravenous Stopped 01/11/2021 2305)  amLODipine (NORVASC) tablet 10 mg (10 mg Oral Not Given 01/14/2021 2309)  atorvastatin (LIPITOR) tablet 20 mg (has no administration in time range)  cilostazol (PLETAL) tablet 100 mg (100 mg Oral Given 01/16/2021 2316)  vitamin B-12 (CYANOCOBALAMIN) tablet 1,000  mcg (has no administration in time range)  cefTRIAXone (ROCEPHIN) 1 g in sodium chloride 0.9 % 100 mL IVPB (has no administration in time range)  mometasone-formoterol (DULERA) 200-5 MCG/ACT inhaler 2 puff (2 puffs Inhalation Not Given 01/03/2021 2307)  heparin injection 5,000 Units (5,000 Units Subcutaneous Given 12/30/2020 2309)  potassium chloride 10 mEq in 100 mL IVPB (10 mEq Intravenous New Bag/Given 01/20/21 0005)  lactated ringers infusion ( Intravenous New Bag/Given 01/23/2021 2151)  methylPREDNISolone sodium succinate (SOLU-MEDROL) 125 mg/2 mL injection 125 mg (has no administration in time range)  ipratropium-albuterol (DUONEB) 0.5-2.5 (3) MG/3ML nebulizer solution 3 mL (3 mLs Nebulization Given 12/28/2020 2239)  ferrous sulfate tablet 325 mg (has no administration in time range)  albuterol (PROVENTIL) (2.5 MG/3ML) 0.083% nebulizer solution 2.5 mg (has no administration in time range)  sodium chloride 0.9 % bolus 1,000 mL (0 mLs Intravenous Stopped 01/25/2021 2309)  cefTRIAXone (ROCEPHIN) 1 g in sodium chloride 0.9 % 100 mL IVPB (0 g Intravenous Stopped 01/10/2021 1949)  methylPREDNISolone sodium succinate (SOLU-MEDROL) 125 mg/2 mL injection 125 mg (125 mg Intravenous Given 01/25/2021 1921)  albuterol (PROVENTIL) (2.5 MG/3ML) 0.083% nebulizer solution 2.5 mg (2.5 mg Nebulization Given 01/02/2021 2009)    ED Course  I have reviewed the triage vital signs and the  nursing notes.  Pertinent labs & imaging results that were available during my care of the patient were reviewed by me and considered in my medical decision making (see chart for details).    MDM Rules/Calculators/A&P                          Initial impression-patient presents with altered mental status.  He is alert, appear to be ill, vital signs noted for tachycardia.  Concern for possible infection will obtain sepsis lab work-up add on CT head and reassess.  Work-up-CBC unremarkable, CMP shows hyponatremia 149, hypokalemia 3.3, hyperglycemia  112, elevated BUN at 34, elevated calcium 11.9, slightly elevated alk phos of 160 6T bili 1.5, CK 330, lipase 34, lactic 1.5 first troponin was 40, second opponent was 41, respiratory panel unremarkable, prothrombin time slightly elevated at 15.7 INR 1.3.  CT head negative for acute findings.  Chest x-ray reveals right upper lobe pneumonia.  Reassessment-x-ray shows right upper lobe pneumonia this is consistent with his presentation, he has noted wheezing will provide him with steroids and a neb treatment and reassess.  We will also start patient on antibiotics and fluids.  Patient was reassessed, has no complaints this time, vital signs remained stable, will recommend admission patient's wife in agreement this plan.  Will consult hospitalist.  Consult spoke with Dr. Nori Riis who will admit the patient.  Rule out-low suspicion for intracranial head bleed and/or CVA as CT head is negative for acute findings, there is no focal deficits present on exam, patient is altered suspect this is metabolic encephalopathy secondary due to pneumonia.  Low suspicion for rhabdo as CK is within normal limits.  Low suspicion for ACS as patient has chest pain, shortness of breath, EKG without signs of ischemia, does have an elevated troponin likely due to demand ischemia as he is hypertensive.  Low suspicion for liver or gallbladder abnormality as right upper quadrant is nontender to palpation, he is not jaundiced on my exam, does have slight increase in his AST and alk phos and T bili but I suspect this is more transient.  Low suspicion for intra-abdominal abnormality as abdomen soft nontender to palpation.  It is noted that patient has an elevated sodium of 149 and evaluation calcium I suspect this is likely due to dehydration as he has had poor oral intake for last couple days he also appears dry to my exam.   Plan-admit to medicine due to altered mental status likely due to right upper lobe pneumonia.  Final Clinical  Impression(s) / ED Diagnoses Final diagnoses:  Community acquired pneumonia of right upper lobe of lung  Encephalopathy    Rx / DC Orders ED Discharge Orders     None        Marcello Fennel, PA-C 86/76/72 0947    Lianne Cure, DO 09/62/83 2220

## 2021-01-19 NOTE — H&P (Addendum)
TRH H&P    Patient Demographics:    Devin [name], is a 75 y.o. male  MRN: [id number]  DOB - 01-Jan-1946  Admit Date - 01/03/2021  Referring MD/NP/PA: Richrd Humbles  Outpatient Primary MD for the patient is [name], MD  Patient coming from: Home  Chief complaint- fall   HPI:    Devin [name]  is a 75 y.o. male, with history of coronary artery disease, GERD, hypertension, stenosis of carotid artery, COPD, TIA, and more presents the ED with chief complaint of fall.  Unfortunately patient is not able to provide any history.  He opens his eyes to light touch, but then he talks incoherently.  This has been his mentation since he arrived in the ED.  His wife apparently was here when he first arrived.  She told staff that he has been fatigued, generalized weakness for the past 2 days.  He went to sit on the couch and slid off last night.  She was not able to get him up, so he laid on the floor for 24 hours.  She did give him a pillow.  Today she called EMS to help get him up.  No further history of present illness in chart review at this time, as EDP note is not done yet.  In the ED Temp 98.2, heart rate 51-1 13, respiratory rate 14-20, blood pressure 186/88, satting at 92% No leukocytosis with a white blood cell count of 7.2, hemoglobin 13.8, platelets 30 Chemistry panel reveals a hyponatremia and hypercalcemia, hypokalemia, hyperchloremia, mildly elevated BUN Troponin is 39, repeat pending CPK 330 Lactic acid initially 1.8 and then 1.5 EKG shows a heart rate of 123, sinus tachycardia, QTC 463 Albuterol, Rocephin, Solu-Medrol, 1 L normal saline given   LR and potassium started at admission, Zithromax added at admission    Review of systems:  unFortunately review of systems cannot be obtained secondary to patient's altered mental status    Past History of the following :    Past Medical History:   Diagnosis Date   Arthritis    Asthma    Carpal tunnel syndrome    Chest pain, unspecified    Chronic kidney disease    hx of kidney stones   Coronary artery disease    Coronary atherosclerosis of artery bypass graft    GERD (gastroesophageal reflux disease)    History of kidney stones    HOH (hard of hearing)    Hypertension    Occlusion and stenosis of carotid artery without mention of cerebral infarction    Other and unspecified hyperlipidemia    Personal history of unspecified circulatory disease    Shortness of breath    Stroke ([place])    TIA (transient ischemic attack)       Past Surgical History:  Procedure Laterality Date   ABDOMINAL AORTOGRAM N/A 06/14/2017   Procedure: ABDOMINAL AORTOGRAM;  Surgeon: Wellington Hampshire, MD;  Location: [place] [facility];  Service: Cardiovascular;  Laterality: N/A;   ABDOMINAL AORTOGRAM N/A 01/26/2018   Procedure: ABDOMINAL AORTOGRAM;  Surgeon: Oneida Alar,  Jessy Oto, MD;  Location: [place] [facility];  Service: Cardiovascular;  Laterality: N/A;   ABDOMINAL AORTOGRAM N/A 07/11/2018   Procedure: ABDOMINAL AORTOGRAM;  Surgeon: Marty Heck, MD;  Location: [place] [facility];  Service: Cardiovascular;  Laterality: N/A;   BACK SURGERY     cervical disc   CARDIAC CATHETERIZATION     CARPAL TUNNEL RELEASE     blateral carpal tunnel   COLONOSCOPY WITH PROPOFOL N/A 02/18/2020   Procedure: COLONOSCOPY WITH PROPOFOL;  Surgeon: Eloise Harman, DO;  [facility];  Service: Endoscopy;  Laterality: N/A;  7:30   CORONARY ARTERY BYPASS GRAFT     x5 12 yrs ago   ENDARTERECTOMY FEMORAL Right 07/03/2017   Procedure: ENDARTERECTOMY FEMORAL RIGHT;  Surgeon: Elam Dutch, MD;  [facility] OR;  Service: Vascular;  Laterality: Right;   FEMORAL-POPLITEAL BYPASS GRAFT Right 07/03/2017   Procedure: BYPASS GRAFT FEMORAL-POPLITEAL ARTERY;  Surgeon: Elam Dutch, MD;  Location: [facility] OR;  Service: Vascular;  Laterality: Right;   FEMORAL-POPLITEAL  BYPASS GRAFT Left 02/12/2018   Procedure: BYPASS GRAFT FEMORAL-POPLITEAL ARTERY LEFT LEG;  Surgeon: Elam Dutch, MD;  Location: [place];  Service: Vascular;  Laterality: Left;   LOWER EXTREMITY ANGIOGRAM Bilateral 08/05/2015   Procedure: Lower Extremity Angiogram;  Surgeon: Wellington Hampshire, MD;  Location: [place] [facility];  Service: Cardiovascular;  Laterality: Bilateral;   LOWER EXTREMITY ANGIOGRAPHY Bilateral 06/14/2017   Procedure: Lower Extremity Angiography;  Surgeon: Wellington Hampshire, MD;  Location: [place] [facility];  Service: Cardiovascular;  Laterality: Bilateral;   LOWER EXTREMITY ANGIOGRAPHY Bilateral 01/26/2018   Procedure: LOWER EXTREMITY ANGIOGRAPHY;  Surgeon: Elam Dutch, MD;  Location: [place] [facility];  Service: Cardiovascular;  Laterality: Bilateral;   LOWER EXTREMITY ANGIOGRAPHY Right 07/11/2018   Procedure: LOWER EXTREMITY ANGIOGRAPHY;  Surgeon: Marty Heck, MD;  Location: [place] [facility];  Service: Cardiovascular;  Laterality: Right;  thrombolysis fem/tib bypass graft   NECK SURGERY     PATCH ANGIOPLASTY Right 07/03/2017   Procedure: VEIN PATCH ANGIOPLASTY OF FEMORAL AND POSTERIOR TIBIAL ARTERIES;  Surgeon: Elam Dutch, MD;  Location: [place];  Service: Vascular;  Laterality: Right;   PERIPHERAL VASCULAR BALLOON ANGIOPLASTY Right 07/12/2018   Procedure: PERIPHERAL VASCULAR BALLOON ANGIOPLASTY;  Surgeon: Marty Heck, MD;  Location: [place] [facility];  Service: Cardiovascular;  Laterality: Right;  Right fem posterior tibial graft and posterior tibial artery.   PERIPHERAL VASCULAR BALLOON ANGIOPLASTY Right 07/13/2018   Procedure: PERIPHERAL VASCULAR BALLOON ANGIOPLASTY;  Surgeon: Elam Dutch, MD;  Location: [place] [facility];  Service: Cardiovascular;  Laterality: Right;  PT   PERIPHERAL VASCULAR CATHETERIZATION N/A 08/05/2015   Procedure: Abdominal Aortogram;  Surgeon: Wellington Hampshire, MD;  Location: [place] [facility];  Service:  Cardiovascular;  Laterality: N/A;   PERIPHERAL VASCULAR THROMBECTOMY N/A 07/12/2018   Procedure: PERIPHERAL VASCULAR THROMBECTOMY - LYSIS RECHECK;  Surgeon: Marty Heck, MD;  Location: [place] [facility];  Service: Cardiovascular;  Laterality: N/A;   PERIPHERAL VASCULAR THROMBECTOMY Right 07/13/2018   Procedure: PERIPHERAL VASCULAR THROMBECTOMY;  Surgeon: Elam Dutch, MD;  Location: [place] [facility];  Service: Cardiovascular;  Laterality: Right;   POLYPECTOMY  02/18/2020   Procedure: POLYPECTOMY;  Surgeon: Eloise Harman, DO;  [facility];  Service: Endoscopy;;   SHOULDER ARTHROSCOPY W/ ROTATOR CUFF REPAIR     Right   VEIN HARVEST Left 02/12/2018   Procedure: VEIN HARVEST;  Surgeon: Elam Dutch, MD;  [facility];  Service: Vascular;  Laterality: Left;      Social History:      Social History   Tobacco Use   Smoking status: Former    Packs/day: 1.00    Years: 50.00    Pack years: 50.00    Types: Cigarettes, Cigars    Quit date: 06/07/2017    Years since quitting: 3.6   Smokeless tobacco: Never  Substance Use Topics   Alcohol use: No    Alcohol/week: 0.0 standard drinks       Family History :     Family History  Problem Relation Age of Onset   Heart disease Other    Arthritis Other    Asthma Other    Coronary artery disease Other    Anesthesia problems Neg Hx    Hypotension Neg Hx    Malignant hyperthermia Neg Hx    Pseudochol deficiency Neg Hx       Home Medications:   Prior to Admission medications   Medication Sig Start Date End Date Taking? Authorizing Provider  albuterol (PROVENTIL HFA;VENTOLIN HFA) 108 (90 Base) MCG/ACT inhaler Inhale 2 puffs into the lungs every 6 (six) hours as needed for wheezing or shortness of breath.   Yes [provider]  amLODipine (NORVASC) 5 MG tablet TAKE 1 TABLET(5 MG) BY MOUTH DAILY 07/20/20  Yes Sherren Mocha, MD  aspirin 81 MG EC tablet Take 81 mg by mouth daily.     Yes  [provider]  atorvastatin (LIPITOR) 40 MG tablet TAKE 1 TABLET(40 MG) BY MOUTH DAILY 07/20/20  Yes Sherren Mocha, MD  calcium carbonate (OSCAL) 1500 (600 Ca) MG TABS tablet Take 600 mg of elemental calcium by mouth daily with breakfast.   Yes [provider]  Cholecalciferol 5000 units TABS Take 5,000 Units by mouth daily.   Yes [provider]  cilostazol (PLETAL) 100 MG tablet Take 1 tablet (100 mg total) by mouth 2 (two) times daily. Please keep upcoming 01/22/21 appointment for further refills 01/18/21  Yes Sherren Mocha, MD  dorzolamide-timolol (COSOPT) 22.3-6.8 MG/ML ophthalmic solution Place 1 drop into the right eye 2 (two) times daily.    Yes [provider]  IRON PO Take 1 tablet by mouth daily.   Yes [provider]  latanoprost (XALATAN) 0.005 % ophthalmic solution Place 1 drop into both eyes at bedtime.    Yes [provider]  nitroGLYCERIN (NITROSTAT) 0.4 MG SL tablet Place 1 tablet (0.4 mg total) under the tongue every 5 (five) minutes as needed for chest pain. 12/12/19  Yes Sherren Mocha, MD  omeprazole (PRILOSEC OTC) 20 MG tablet Take 20 mg by mouth daily.     Yes [provider]  potassium chloride (MICRO-K) 10 MEQ CR capsule Take 10 mEq by mouth daily. 12/29/20  Yes [provider]  SYMBICORT 160-4.5 MCG/ACT inhaler Inhale 2 puffs into the lungs 2 (two) times daily. 12/29/20  Yes [provider]  vitamin B-12 (CYANOCOBALAMIN) 1000 MCG tablet Take 1,000 mcg by mouth daily.   Yes [provider]  zinc gluconate 50 MG tablet Take 50 mg by mouth daily.   Yes [provider]  acidophilus (RISAQUAD) CAPS capsule Take 1 capsule by mouth daily. Patient not taking: Reported on 01/05/2021    [provider]  SYMBICORT 80-4.5 MCG/ACT inhaler Inhale 2 puffs into the lungs 2 (two) times daily.  Patient not taking: Reported on 01/03/2021 10/17/17   [provider]      Allergies:     Allergies  Allergen Reactions   Aspirin Other (See Comments)    Has to take the coated aspirin     Physical Exam:   Vitals  Blood pressure (!) 163/91, pulse 82, temperature 98.2 F (36.8 C), temperature source Oral, resp. rate (!) 24, height '5\' 4"'$  (1.626 m), weight 74.4 kg, SpO2 95 %.  1.  General: Patient lying supine in bed,  no acute distress   2. Psychiatric: Somnolent, incoherently speaking  3. Neurologic: Language is incoherent, face is symmetric, moves all 4 extremities voluntarily, at baseline without acute deficits on limited exam   4. HEENMT:  Head is atraumatic, normocephalic, pupils reactive to light, neck is slightly cachectic, trachea is midline, mucous membranes are mildly dry  5. Respiratory :  diffuse bilateral wheezing, without rales or rhonchi, no cyanosis, no increase in work of breathing or accessory muscle use, maintaining oxygen saturations on room air   6. Cardiovascular : Heart rate normal, rhythm is regular, no murmurs, rubs or gallops, no peripheral edema, peripheral pulses palpated, varicose veins   7. Gastrointestinal:  Abdomen is soft, nondistended, nontender to palpation bowel sounds active, no masses or organomegaly palpated   8. Skin:  Skin is warm, dry and intact without rashes, acute lesions, or ulcers on limited exam   9.Musculoskeletal:  No acute deformities or trauma, no asymmetry in tone, no peripheral edema, peripheral pulses palpated, no tenderness to palpation in the extremities     Data Review:    CBC Recent Labs  Lab 12/30/2020 1728  WBC 7.2  HGB 13.8  HCT 40.7  PLT 30*  MCV 95.8  MCH 32.5  MCHC 33.9  RDW 14.0  LYMPHSABS 1.5  MONOABS 0.9  EOSABS 0.1  BASOSABS 0.1   ------------------------------------------------------------------------------------------------------------------  Results for orders placed or performed during the hospital encounter of 01/09/2021 (from the past 48 hour(s))   Comprehensive metabolic panel     Status: Abnormal   Collection Time: 12/30/2020  5:28 PM  Result Value Ref Range   Sodium 149 (H) 135 - 145 mmol/L   Potassium 3.3 (L) 3.5 - 5.1 mmol/L   Chloride 113 (H) 98 - 111 mmol/L   CO2 25 22 - 32 mmol/L   Glucose, Bld 112 (H) 70 - 99 mg/dL    Comment: Glucose reference range applies only to samples taken after fasting for at least 8 hours.   BUN 34 (H) 8 - 23 mg/dL   Creatinine, Ser 1.09 0.61 - 1.24 mg/dL   Calcium 11.9 (H) 8.9 - 10.3 mg/dL   Total Protein 7.9 6.5 - 8.1 g/dL   Albumin 3.9 3.5 - 5.0 g/dL   AST 42 (H) 15 - 41 U/L   ALT 30 0 - 44 U/L   Alkaline Phosphatase 166 (H) 38 - 126 U/L   Total Bilirubin 1.5 (H) 0.3 - 1.2 mg/dL   GFR, Estimated >60 >60 mL/min    Comment: (NOTE) Calculated using the CKD-EPI Creatinine Equation (2021)    Anion gap 11 5 - 15    Comment: Performed at [facility], [address], [place], [place] [address]  CBC with Differential     Status: Abnormal   Collection Time: 01/27/2021  5:28 PM  Result Value Ref Range   WBC 7.2 4.0 - 10.5 K/uL   RBC 4.25 4.22 - 5.81 MIL/uL   Hemoglobin 13.8 13.0 - 17.0 g/dL   HCT 40.7 39.0 - 52.0 %   MCV 95.8  80.0 - 100.0 fL   MCH 32.5 26.0 - 34.0 pg   MCHC 33.9 30.0 - 36.0 g/dL   RDW 14.0 11.5 - 15.5 %   Platelets 30 (L) 150 - 400 K/uL    Comment: SPECIMEN CHECKED FOR CLOTS Immature Platelet Fraction may be clinically indicated, consider ordering this additional test [id number] REPEATED TO VERIFY PLATELET COUNT CONFIRMED BY SMEAR    nRBC 0.4 (H) 0.0 - 0.2 %   Neutrophils Relative % 62 %   Neutro Abs 4.5 1.7 - 7.7 K/uL   Lymphocytes Relative 21 %   Lymphs Abs 1.5 0.7 - 4.0 K/uL   Monocytes Relative 12 %   Monocytes Absolute 0.9 0.1 - 1.0 K/uL   Eosinophils Relative 1 %   Eosinophils Absolute 0.1 0.0 - 0.5 K/uL   Basophils Relative 1 %   Basophils Absolute 0.1 0.0 - 0.1 K/uL   Immature Granulocytes 3 %   Abs Immature Granulocytes 0.19 (H) 0.00 - 0.07 K/uL     Comment: Performed at [facility], [address], [place], [place] [address]  Lactic acid, plasma     Status: None   Collection Time: 01/11/2021  5:28 PM  Result Value Ref Range   Lactic Acid, Venous 1.8 0.5 - 1.9 mmol/L    Comment: Performed at [facility], [address] [place], [place] [address]  CK     Status: None   Collection Time: 01/17/2021  5:28 PM  Result Value Ref Range   Total CK 330 49 - 397 U/L    Comment: Performed at [facility], [address] [place], [place] [address]  Lipase, blood     Status: None   Collection Time: 01/15/2021  5:28 PM  Result Value Ref Range   Lipase 34 11 - 51 U/L    Comment: Performed at [facility], [address], [place], [place] [address]  Troponin I (High Sensitivity)     Status: Abnormal   Collection Time: 01/20/2021  5:28 PM  Result Value Ref Range   Troponin I (High Sensitivity) 39 (H) <18 ng/L    Comment: (NOTE) Elevated high sensitivity troponin I (hsTnI) values and significant  changes across serial measurements may suggest ACS but many other  chronic and acute conditions are known to elevate hsTnI results.  Refer to the "Links" section for chest pain algorithms and additional  guidance. Performed at [facility], [address], [place], [place] [address]   Protime-INR     Status: Abnormal   Collection Time: 01/10/2021  5:28 PM  Result Value Ref Range   Prothrombin Time 15.7 (H) 11.4 - 15.2 seconds   INR 1.3 (H) 0.8 - 1.2    Comment: (NOTE) INR goal varies based on device and disease states. Performed at [facility], [address], [place], [place] [address]   Lactic acid, plasma     Status: None   Collection Time: 12/29/2020  6:35 PM  Result Value Ref Range   Lactic Acid, Venous 1.5 0.5 - 1.9 mmol/L    Comment: Performed at [facility], [address], [place], [place] [address]  Blood gas, venous (at [facility] and [facility], not at [facility])     Status: Abnormal   Collection Time: 12/29/2020  6:35 PM  Result Value Ref Range   FIO2 21.00    pH,  Ven 7.452 (H) 7.250 - 7.430   pCO2, Ven 36.8 (L) 44.0 - 60.0 mmHg   pO2, Ven 91.1 (H) 32.0 - 45.0 mmHg   Bicarbonate  26.1 20.0 - 28.0 mmol/L   Acid-Base Excess 1.7 0.0 - 2.0 mmol/L   O2 Saturation 97.2 %   Patient temperature 36.8     Comment: Performed at [facility], [address], [place], [place] [address]  Resp Panel by RT-PCR (Flu A&B, Covid) Nasopharyngeal Swab     Status: None   Collection Time: 01/12/2021  6:37 PM   Specimen: Nasopharyngeal Swab; Nasopharyngeal(NP) swabs in vial transport medium  Result Value Ref Range   SARS Coronavirus 2 by RT PCR NEGATIVE NEGATIVE    Comment: (NOTE) SARS-CoV-2 target nucleic acids are NOT DETECTED.  The SARS-CoV-2 RNA is generally detectable in upper respiratory specimens during the acute phase of infection. The lowest concentration of SARS-CoV-2 viral copies this assay can detect is 138 copies/mL. A negative result does not preclude SARS-Cov-2 infection and should not be used as the sole basis for treatment or other patient management decisions. A negative result may occur with  improper specimen collection/handling, submission of specimen other than nasopharyngeal swab, presence of viral mutation(s) within the areas targeted by this assay, and inadequate number of viral copies(<138 copies/mL). A negative result must be combined with clinical observations, patient history, and epidemiological information. The expected result is Negative.  Fact Sheet for Patients:  EntrepreneurPulse.com.au  Fact Sheet for Healthcare Providers:  IncredibleEmployment.be  This test is no t yet approved or cleared by the [place] FDA and  has been authorized for detection and/or diagnosis of SARS-CoV-2 by FDA under an Emergency Use Authorization (EUA). This EUA will remain  in effect (meaning this test can be used) for the duration of the COVID-19 declaration under Section 564(b)(1) of the Act, 21 U.S.C.section  360bbb-3(b)(1), unless the authorization is terminated  or revoked sooner.       Influenza A by PCR NEGATIVE NEGATIVE   Influenza B by PCR NEGATIVE NEGATIVE    Comment: (NOTE) The Xpert Xpress SARS-CoV-2/FLU/RSV plus assay is intended as an aid in the diagnosis of influenza from Nasopharyngeal swab specimens and should not be used as a sole basis for treatment. Nasal washings and aspirates are unacceptable for Xpert Xpress SARS-CoV-2/FLU/RSV testing.  Fact Sheet for Patients: EntrepreneurPulse.com.au  Fact Sheet for Healthcare Providers: IncredibleEmployment.be  This test is not yet approved or cleared by the [place] FDA and has been authorized for detection and/or diagnosis of SARS-CoV-2 by FDA under an Emergency Use Authorization (EUA). This EUA will remain in effect (meaning this test can be used) for the duration of the COVID-19 declaration under Section 564(b)(1) of the Act, 21 U.S.C. section 360bbb-3(b)(1), unless the authorization is terminated or revoked.  Performed at [facility], [address] [place], [place] [address]   Troponin I (High Sensitivity)     Status: Abnormal   Collection Time: 01/04/2021  8:21 PM  Result Value Ref Range   Troponin I (High Sensitivity) 41 (H) <18 ng/L    Comment: (NOTE) Elevated high sensitivity troponin I (hsTnI) values and significant  changes across serial measurements may suggest ACS but many other  chronic and acute conditions are known to elevate hsTnI results.  Refer to the "Links" section for chest pain algorithms and additional  guidance. Performed at [facility], [address] [place], [place] [address]     Chemistries  Recent Labs  Lab 01/12/2021 1728  NA 149*  K 3.3*  CL 113*  CO2 25  GLUCOSE 112*  BUN 34*  CREATININE 1.09  CALCIUM 11.9*  AST 42*  ALT 30  ALKPHOS  166*  BILITOT 1.5*    ------------------------------------------------------------------------------------------------------------------  ------------------------------------------------------------------------------------------------------------------ GFR: Estimated Creatinine Clearance: 54.9 mL/min (by C-G formula based on SCr of 1.09 mg/dL). Liver Function Tests: Recent Labs  Lab 01/24/2021 1728  AST 42*  ALT 30  ALKPHOS 166*  BILITOT 1.5*  PROT 7.9  ALBUMIN 3.9   Recent Labs  Lab 01/03/2021 1728  LIPASE 34   No results for input(s): AMMONIA in the last 168 hours. Coagulation Profile: Recent Labs  Lab 01/09/2021 1728  INR 1.3*   Cardiac Enzymes: Recent Labs  Lab 01/09/2021 1728  CKTOTAL 330   BNP (last 3 results) No results for input(s): PROBNP in the last 8760 hours. HbA1C: No results for input(s): HGBA1C in the last 72 hours. CBG: No results for input(s): GLUCAP in the last 168 hours. Lipid Profile: No results for input(s): CHOL, HDL, LDLCALC, TRIG, CHOLHDL, LDLDIRECT in the last 72 hours. Thyroid Function Tests: No results for input(s): TSH, T4TOTAL, FREET4, T3FREE, THYROIDAB in the last 72 hours. Anemia Panel: No results for input(s): VITAMINB12, FOLATE, FERRITIN, TIBC, IRON, RETICCTPCT in the last 72 hours.  --------------------------------------------------------------------------------------------------------------- Urine analysis:    Component Value Date/Time   COLORURINE YELLOW 02/05/2018 0828   APPEARANCEUR CLEAR 02/05/2018 0828   LABSPEC 1.020 02/05/2018 0828   PHURINE 7.0 02/05/2018 0828   GLUCOSEU NEGATIVE 02/05/2018 0828   HGBUR NEGATIVE 02/05/2018 0828   BILIRUBINUR NEGATIVE 02/05/2018 0828   KETONESUR 15 (A) 02/05/2018 0828   PROTEINUR NEGATIVE 02/05/2018 0828   NITRITE NEGATIVE 02/05/2018 0828   LEUKOCYTESUR NEGATIVE 02/05/2018 0828      Imaging Results:    CT HEAD WO CONTRAST (5MM)  Result Date: 01/02/2021 CLINICAL DATA:  Mental status changes EXAM: CT  HEAD WITHOUT CONTRAST TECHNIQUE: Contiguous axial images were obtained from the base of the skull through the vertex without intravenous contrast. COMPARISON:  11/27/2001 FINDINGS: Brain: There is atrophy and chronic small vessel disease changes. No acute intracranial abnormality. Specifically, no hemorrhage, hydrocephalus, mass lesion, acute infarction, or significant intracranial injury. Vascular: No hyperdense vessel or unexpected calcification. Skull: No acute calvarial abnormality. Sinuses/Orbits: No acute findings Other: None IMPRESSION: Atrophy, chronic microvascular disease. No acute intracranial abnormality. Electronically Signed   By: Rolm Baptise M.D.   On: 01/15/2021 18:58   DG Chest Port 1 View  Result Date: 01/25/2021 CLINICAL DATA:  Cough. EXAM: PORTABLE CHEST 1 VIEW COMPARISON:  Chest x-ray dated July 14, 2018. FINDINGS: Stable cardiomediastinal silhouette status post CABG. Patchy opacities in the right upper lobe. No pleural effusion or pneumothorax. No acute osseous abnormality. IMPRESSION: 1. Right upper lobe pneumonia. Electronically Signed   By: Titus Dubin M.D.   On: 01/03/2021 18:10       Assessment & Plan:    Active Problems:   Hyperlipidemia   CAP (community acquired pneumonia)   Hypertension   Hypernatremia   Hypercalcemia   Dehydration   Hypokalemia   Acute metabolic encephalopathy   Community-acquired pneumonia With a right upper lobe infiltrate on chest x-ray Rocephin and Zithromax started Urine antigens pending Continue albuterol as needed, DuoNeb scheduled No leukocytosis, no lactic acidosis, creatinine 1.09, no oxygen requirement, maintaining blood pressure, -does not meet criteria for sepsis VBG shows a PCO2 36, pH 7.452 Respiratory panel negative Continue to monitor Acute metabolic encephalopathy CT head shows atrophy, chronic microvascular changes, no acute changes Most likely secondary to infection Continue treatment as above Blood culture  pending, urinalysis pending Replace electrolytes Dehydration likely also contributing-continue fluids Treat underlying causes and continue to monitor Dehydration  With a BUN to creatinine ratio of 34:1 0.09 Electrolyte abnormalities including hypernatremia, hyperchloremia, hypercalcemia, hypokalemia 1 L bolus in the ED Continue LR infusion Monitor BMP at midnight Continue to monitor Thrombocytopenia Hold pharmaceutical anticoagulation Unclear etiology Elevated troponin Flat at 39 and 41 Likely demand ischemia in the setting of dehydration Continue to monitor on telemetry COPD With mild exacerbation No CO2 retention Wheezing on exam Continue home inhalers DuoNeb scheduled Solu-Medrol And treat underlying cause with Rocephin and Zithromax Hyponatremia, hypercalcemia Hold calcium supplementation Continue fluids as above Hypertension BP elevated in the ED to 186/88 Restart home hypertensive medications now It is reasonable to assume the patient was not getting home medications but if he laid on the floor at home Hyperlipidemia Continue statin Fall Consult PT   DVT Prophylaxis-   holding pharmaceutical anticoagulation secondary to thrombocytopenia SCDs   AM Labs Ordered, also please review Full Orders  Family Communication: No family at bedside  Code Status: Full  Admission status: Inpatient :The appropriate admission status for this patient is INPATIENT. Inpatient status is judged to be reasonable and necessary in order to provide the required intensity of service to ensure the patient's safety. The patient's presenting symptoms, physical exam findings, and initial radiographic and laboratory data in the context of their chronic comorbidities is felt to place them at high risk for further clinical deterioration. Furthermore, it is not anticipated that the patient will be medically stable for discharge from the hospital within 2 midnights of admission. The following factors  support the admission status of inpatient.     The patient's presenting symptoms include fall. The worrisome physical exam findings include dehydration. The initial radiographic and laboratory data are worrisome because of pneumonia. The chronic co-morbidities include hypertension, hyperlipidemia, COPD.       * I certify that at the point of admission it is my clinical judgment that the patient will require inpatient hospital care spanning beyond 2 midnights from the point of admission due to high intensity of service, high risk for further deterioration and high frequency of surveillance required.*  Time spent in minutes : [place]

## 2021-01-19 NOTE — ED Triage Notes (Signed)
Patient brought in by [facility].  EMS states that the family called and said the patient is altered.  Patient is alert at this time, A&Ox2.

## 2021-01-19 NOTE — Progress Notes (Addendum)
Gave neb treatment.  Patient stated that he normally takes Symbicort at home but did not take it today.  Wife stated that patient has a history of asthma and had mowed the yard about two weeks ago but just started having trouble.  Patient having trouble with completing sentences at this time.  Will continue to monitor patient.

## 2021-01-20 ENCOUNTER — Encounter (HOSPITAL_COMMUNITY): Payer: Self-pay | Admitting: Family Medicine

## 2021-01-20 DIAGNOSIS — J189 Pneumonia, unspecified organism: Secondary | ICD-10-CM | POA: Diagnosis not present

## 2021-01-20 DIAGNOSIS — G9341 Metabolic encephalopathy: Secondary | ICD-10-CM | POA: Diagnosis not present

## 2021-01-20 DIAGNOSIS — E870 Hyperosmolality and hypernatremia: Secondary | ICD-10-CM | POA: Diagnosis not present

## 2021-01-20 LAB — APTT: aPTT: 34 seconds (ref 24–36)

## 2021-01-20 LAB — COMPREHENSIVE METABOLIC PANEL
ALT: 29 U/L (ref 0–44)
AST: 38 U/L (ref 15–41)
Albumin: 3.3 g/dL — ABNORMAL LOW (ref 3.5–5.0)
Alkaline Phosphatase: 138 U/L — ABNORMAL HIGH (ref 38–126)
Anion gap: 13 (ref 5–15)
BUN: 30 mg/dL — ABNORMAL HIGH (ref 8–23)
CO2: 22 mmol/L (ref 22–32)
Calcium: 10.8 mg/dL — ABNORMAL HIGH (ref 8.9–10.3)
Chloride: 113 mmol/L — ABNORMAL HIGH (ref 98–111)
Creatinine, Ser: 0.88 mg/dL (ref 0.61–1.24)
GFR, Estimated: >60 mL/min (ref >60)
Glucose, Bld: 134 mg/dL — ABNORMAL HIGH (ref 70–99)
Potassium: 3.4 mmol/L — ABNORMAL LOW (ref 3.5–5.1)
Sodium: 148 mmol/L — ABNORMAL HIGH (ref 135–145)
Total Bilirubin: 1.2 mg/dL (ref 0.3–1.2)
Total Protein: 6.7 g/dL (ref 6.5–8.1)

## 2021-01-20 LAB — BASIC METABOLIC PANEL
Anion gap: 10 (ref 5–15)
BUN: 31 mg/dL — ABNORMAL HIGH (ref 8–23)
CO2: 24 mmol/L (ref 22–32)
Calcium: 10.7 mg/dL — ABNORMAL HIGH (ref 8.9–10.3)
Chloride: 115 mmol/L — ABNORMAL HIGH (ref 98–111)
Creatinine, Ser: 0.93 mg/dL (ref 0.61–1.24)
GFR, Estimated: >60 mL/min (ref >60)
Glucose, Bld: 129 mg/dL — ABNORMAL HIGH (ref 70–99)
Potassium: 3.8 mmol/L (ref 3.5–5.1)
Sodium: 149 mmol/L — ABNORMAL HIGH (ref 135–145)

## 2021-01-20 LAB — VITAMIN B12: Vitamin B-12: 2056 pg/mL — ABNORMAL HIGH (ref 180–914)

## 2021-01-20 LAB — CBC
HCT: 35.3 % — ABNORMAL LOW (ref 39.0–52.0)
Hemoglobin: 11.5 g/dL — ABNORMAL LOW (ref 13.0–17.0)
MCH: 31.8 pg (ref 26.0–34.0)
MCHC: 32.6 g/dL (ref 30.0–36.0)
MCV: 97.5 fL (ref 80.0–100.0)
Platelets: 26 10*3/uL — CL (ref 150–400)
RBC: 3.62 MIL/uL — ABNORMAL LOW (ref 4.22–5.81)
RDW: 13.7 % (ref 11.5–15.5)
WBC: 5.4 10*3/uL (ref 4.0–10.5)
nRBC: 0 % (ref 0.0–0.2)

## 2021-01-20 LAB — FIBRINOGEN: Fibrinogen: 219 mg/dL (ref 210–475)

## 2021-01-20 LAB — LACTATE DEHYDROGENASE: LDH: 666 U/L — ABNORMAL HIGH (ref 98–192)

## 2021-01-20 LAB — HIV ANTIBODY (ROUTINE TESTING W REFLEX): HIV Screen 4th Generation wRfx: NONREACTIVE

## 2021-01-20 LAB — FOLATE: Folate: 6.3 ng/mL (ref >5.9)

## 2021-01-20 LAB — GLUCOSE, CAPILLARY: Glucose-Capillary: 129 mg/dL — ABNORMAL HIGH (ref 70–99)

## 2021-01-20 MED ORDER — SODIUM CHLORIDE 0.45 % IV SOLN: INTRAVENOUS | Status: DC

## 2021-01-20 MED ORDER — METHYLPREDNISOLONE SODIUM SUCC 40 MG IJ SOLR
40.0000 mg | Freq: Two times a day (BID) | INTRAMUSCULAR | Status: DC
  Administered 2021-01-20 – 2021-01-25 (×11): 40 mg via INTRAVENOUS
  Filled 2021-01-20 (×11): qty 1

## 2021-01-20 NOTE — Plan of Care (Signed)
  Problem: Acute Rehab PT Goals(only PT should resolve) Goal: Pt Will Go Supine/Side To Sit Outcome: Progressing Flowsheets (Taken 01/20/2021 1350) Pt will go Supine/Side to Sit: with moderate assist Goal: Patient Will Transfer Sit To/From Stand Outcome: Progressing Flowsheets (Taken 01/20/2021 1350) Patient will transfer sit to/from stand: with moderate assist Goal: Pt Will Transfer Bed To Chair/Chair To Bed Outcome: Progressing Flowsheets (Taken 01/20/2021 1350) Pt will Transfer Bed to Chair/Chair to Bed: with mod assist Goal: Pt Will Ambulate Outcome: Progressing Flowsheets (Taken 01/20/2021 1350) Pt will Ambulate:  15 feet  with moderate assist  with rolling walker   1:50 PM, 01/20/21 Lonell Grandchild, MPT Physical Therapist with [facility] [phone] office [phone] mobile phone

## 2021-01-20 NOTE — Progress Notes (Signed)
Patient Demographics:    Devin [name], is a 75 y.o. male, DOB - 22-Sep-1945, [id number]  Admit date - 01/07/2021   Admitting Physician Jaquan Sadowsky Denton Brick, MD  Outpatient Primary MD for the patient is [name], MD  LOS - 1   Chief Complaint  Patient presents with   Altered Mental Status        Subjective:    [name] today has no fevers, no emesis,  No chest pain,   Complains of fatigue  Assessment  & Plan :    Active Problems:   Hyperlipidemia   CAP (community acquired pneumonia)   Hypertension   Hypernatremia   Hypercalcemia   Dehydration   Hypokalemia   Acute metabolic encephalopathy  Brief Summary:-  75 y.o. male, with history of coronary artery disease, GERD, hypertension, stenosis of carotid artery, COPD, TIA admitted on 01/09/2021 with community-acquired pneumonia and hypoxia  A/p  1) severe sepsis with acute hypoxic respiratory failure in the setting of community-acquired pneumonia (RUL)-- Patient meets sepsis criteria on admission with tachycardia and tachypnea and finding of right upper lobe pneumonia -Continue supplemental oxygen at 2 L/min -HIV negative -Continue bronchodilators mucolytics and Rocephin with azithromycin -Continue IV steroids  2) severe thrombocytopenia--- discussed with Dr. Delton Coombes -Patient with longstanding history of thrombocytopenia platelets usually around 100 K -Platelets 26K -Pathology smear review in progress -LDH elevated at 666 -Serum copper pending -B12 is not low, -Folate is normal -PTT is 34, INR is 1.3, Fibrinogen is low normal L at 219 -We will transfuse if platelet count less than 10 K or concerns for bleeding -Stop Pletal   3) chronic anemia-with normal MCV MCH and RDW -Patient hemoglobin usually between 10 and 11, on admission hemoglobin was 13.8 most likely due to dehydration, post hydration will globin is now close to  baseline at 11.5 -No obvious bleeding noted  4) generalized weakness and deconditioning--- physical therapy recommends SNF rehab  5) acute metabolic encephalopathy---secondary to #1 above, and #8 below -CT head negative for acute findings -Mentation has improved  6)HTN--stable continue PTA meds  7)HLD-stable, continue PTA meds  8) hypernatremia and hypercalcemia--may be dehydration related, recheck after further hydration -Continue IV fluids  9) acute COPD exacerbation--- secondary to #1 above, manage as above #1  Disposition/Need for in-Hospital Stay- patient unable to be discharged at this time due to severe sepsis secondary to pneumonia requiring IV antibiotics and IV fluid Status is: Inpatient  Remains inpatient appropriate because: Please see disposition above  Disposition: The patient is from: Home              Anticipated d/c is to: SNF              Anticipated d/c date is: 2 days              Patient currently is not medically stable to d/c. Barriers: Not Clinically Stable-   Code Status :  -  Code Status: Full Code   Family Communication:    (patient is alert, awake and coherent)  Called wife at [phone] voicemail Consults  :  na  DVT Prophylaxis  :   - SCDs *  SCDs Start: 01/16/2021 2246    Lab Results  Component Value Date   PLT  26 (LL) 01/20/2021    Inpatient Medications  Scheduled Meds:  amLODipine  10 mg Oral Daily   atorvastatin  20 mg Oral Daily   ferrous sulfate  325 mg Oral Q breakfast   ipratropium-albuterol  3 mL Nebulization Q6H   methylPREDNISolone (SOLU-MEDROL) injection  125 mg Intravenous Q24H   mometasone-formoterol  2 puff Inhalation BID   vitamin B-12  1,000 mcg Oral Daily   Continuous Infusions:  sodium chloride 75 mL/hr at 01/20/21 1516   azithromycin Stopped (01/18/2021 2305)   cefTRIAXone (ROCEPHIN)  IV     PRN Meds:.albuterol    Anti-infectives (From admission, onward)    Start     Dose/Rate Route Frequency  Ordered Stop   01/20/21 2000  cefTRIAXone (ROCEPHIN) 1 g in sodium chloride 0.9 % 100 mL IVPB        1 g 200 mL/hr over 30 Minutes Intravenous Every 24 hours 01/08/2021 2245     01/02/2021 2100  azithromycin (ZITHROMAX) 500 mg in sodium chloride 0.9 % 250 mL IVPB        500 mg 250 mL/hr over 60 Minutes Intravenous Every 24 hours 01/13/2021 2056     01/09/2021 1900  cefTRIAXone (ROCEPHIN) 1 g in sodium chloride 0.9 % 100 mL IVPB        1 g 200 mL/hr over 30 Minutes Intravenous  Once 01/22/2021 1851 12/28/2020 1949         Objective:   Vitals:   01/20/21 0900 01/20/21 1207 01/20/21 1403 01/20/21 1436  BP:  (!) 161/79 (!) 152/80   Pulse:  85 92   Resp:  20 20   Temp:  98.3 F (36.8 C)    TempSrc:  Oral    SpO2: 96% 96%  93%  Weight:      Height:        Wt Readings from Last 3 Encounters:  01/20/21 66.2 kg  04/14/20 75.4 kg  12/23/19 73.4 kg     Intake/Output Summary (Last 24 hours) at 01/20/2021 1845 Last data filed at 01/20/2021 1300 Gross per 24 hour  Intake 1074.45 ml  Output --  Net 1074.45 ml     Physical Exam  Gen:- Awake Alert, in no acute distress HEENT:- [place].AT, No sclera icterus Nose- [place] 2L/min Neck-Supple Neck,No JVD,.  Lungs-respirations on the right, no wheezing  CV- S1, S2 normal, regular  Abd-  +ve B.Sounds, Abd Soft, No tenderness,    Extremity/Skin:- No  edema, pedal pulses present , no significant ecchymosis, no petechia no bleeding concerns on mucosal or skin Psych-affect is appropriate, oriented x3 Neuro-generalized weakness, no new focal deficits, no tremors   Data Review:   Micro Results Recent Results (from the past 240 hour(s))  Blood culture (routine x 2)     Status: None (Preliminary result)   Collection Time: 01/25/2021  6:35 PM   Specimen: BLOOD RIGHT HAND  Result Value Ref Range Status   Specimen Description BLOOD RIGHT HAND  Final   Special Requests   Final    BOTTLES DRAWN AEROBIC AND ANAEROBIC Blood Culture adequate volume   Culture    Final    NO GROWTH < 24 HOURS Performed at [facility], [address] [place], [place] [address]    Report Status PENDING  Incomplete  Blood culture (routine x 2)     Status: None (Preliminary result)   Collection Time: 01/06/2021  6:35 PM   Specimen: BLOOD LEFT HAND  Result Value Ref Range Status   Specimen Description BLOOD  LEFT HAND  Final   Special Requests   Final    BOTTLES DRAWN AEROBIC AND ANAEROBIC Blood Culture adequate volume   Culture   Final    NO GROWTH < 24 HOURS Performed at [facility], [address], [place], [place] [address]    Report Status PENDING  Incomplete  Resp Panel by RT-PCR (Flu A&B, Covid) Nasopharyngeal Swab     Status: None   Collection Time: 01/04/2021  6:37 PM   Specimen: Nasopharyngeal Swab; Nasopharyngeal(NP) swabs in vial transport medium  Result Value Ref Range Status   SARS Coronavirus 2 by RT PCR NEGATIVE NEGATIVE Final    Comment: (NOTE) SARS-CoV-2 target nucleic acids are NOT DETECTED.  The SARS-CoV-2 RNA is generally detectable in upper respiratory specimens during the acute phase of infection. The lowest concentration of SARS-CoV-2 viral copies this assay can detect is 138 copies/mL. A negative result does not preclude SARS-Cov-2 infection and should not be used as the sole basis for treatment or other patient management decisions. A negative result may occur with  improper specimen collection/handling, submission of specimen other than nasopharyngeal swab, presence of viral mutation(s) within the areas targeted by this assay, and inadequate number of viral copies(<138 copies/mL). A negative result must be combined with clinical observations, patient history, and epidemiological information. The expected result is Negative.  Fact Sheet for Patients:  EntrepreneurPulse.com.au  Fact Sheet for Healthcare Providers:  IncredibleEmployment.be  This test is no t yet approved or cleared by the [place] FDA and  has been authorized for detection and/or diagnosis of SARS-CoV-2 by FDA under an Emergency Use Authorization (EUA). This EUA will remain  in effect (meaning this test can be used) for the duration of the COVID-19 declaration under Section 564(b)(1) of the Act, 21 U.S.C.section 360bbb-3(b)(1), unless the authorization is terminated  or revoked sooner.       Influenza A by PCR NEGATIVE NEGATIVE Final   Influenza B by PCR NEGATIVE NEGATIVE Final    Comment: (NOTE) The Xpert Xpress SARS-CoV-2/FLU/RSV plus assay is intended as an aid in the diagnosis of influenza from Nasopharyngeal swab specimens and should not be used as a sole basis for treatment. Nasal washings and aspirates are unacceptable for Xpert Xpress SARS-CoV-2/FLU/RSV testing.  Fact Sheet for Patients: EntrepreneurPulse.com.au  Fact Sheet for Healthcare Providers: IncredibleEmployment.be  This test is not yet approved or cleared by the [place] FDA and has been authorized for detection and/or diagnosis of SARS-CoV-2 by FDA under an Emergency Use Authorization (EUA). This EUA will remain in effect (meaning this test can be used) for the duration of the COVID-19 declaration under Section 564(b)(1) of the Act, 21 U.S.C. section 360bbb-3(b)(1), unless the authorization is terminated or revoked.  Performed at [facility], [address] [place], [place] [address]     Radiology Reports CT HEAD WO CONTRAST (5MM)  Result Date: 01/24/2021 CLINICAL DATA:  Mental status changes EXAM: CT HEAD WITHOUT CONTRAST TECHNIQUE: Contiguous axial images were obtained from the base of the skull through the vertex without intravenous contrast. COMPARISON:  11/27/2001 FINDINGS: Brain: There is atrophy and chronic small vessel disease changes. No acute intracranial abnormality. Specifically, no hemorrhage, hydrocephalus, mass lesion, acute infarction, or significant intracranial injury.  Vascular: No hyperdense vessel or unexpected calcification. Skull: No acute calvarial abnormality. Sinuses/Orbits: No acute findings Other: None IMPRESSION: Atrophy, chronic microvascular disease. No acute intracranial abnormality. Electronically Signed   By: Rolm Baptise M.D.   On: 01/11/2021 18:58   DG Chest [facility]  1 View  Result Date: 01/03/2021 CLINICAL DATA:  Cough. EXAM: PORTABLE CHEST 1 VIEW COMPARISON:  Chest x-ray dated July 14, 2018. FINDINGS: Stable cardiomediastinal silhouette status post CABG. Patchy opacities in the right upper lobe. No pleural effusion or pneumothorax. No acute osseous abnormality. IMPRESSION: 1. Right upper lobe pneumonia. Electronically Signed   By: Titus Dubin M.D.   On: 01/01/2021 18:10     CBC Recent Labs  Lab 01/02/2021 1728 01/20/21 0445  WBC 7.2 5.4  HGB 13.8 11.5*  HCT 40.7 35.3*  PLT 30* 26*  MCV 95.8 97.5  MCH 32.5 31.8  MCHC 33.9 32.6  RDW 14.0 13.7  LYMPHSABS 1.5  --   MONOABS 0.9  --   EOSABS 0.1  --   BASOSABS 0.1  --     Chemistries  Recent Labs  Lab 01/07/2021 1728 01/20/21 0008 01/20/21 0445  NA 149* 149* 148*  K 3.3* 3.8 3.4*  CL 113* 115* 113*  CO2 '25 24 22  '$ GLUCOSE 112* 129* 134*  BUN 34* 31* 30*  CREATININE 1.09 0.93 0.88  CALCIUM 11.9* 10.7* 10.8*  AST 42*  --  38  ALT 30  --  29  ALKPHOS 166*  --  138*  BILITOT 1.5*  --  1.2   ------------------------------------------------------------------------------------------------------------------ No results for input(s): CHOL, HDL, LDLCALC, TRIG, CHOLHDL, LDLDIRECT in the last 72 hours.  No results found for: HGBA1C ------------------------------------------------------------------------------------------------------------------ No results for input(s): TSH, T4TOTAL, T3FREE, THYROIDAB in the last 72 hours.  Invalid input(s): FREET3 ------------------------------------------------------------------------------------------------------------------ Recent Labs     01/20/21 1518  VITAMINB12 2,056*  FOLATE 6.3    Coagulation profile Recent Labs  Lab 01/25/2021 1728  INR 1.3*    No results for input(s): DDIMER in the last 72 hours.  Cardiac Enzymes No results for input(s): CKMB, TROPONINI, MYOGLOBIN in the last 168 hours.  Invalid input(s): CK ------------------------------------------------------------------------------------------------------------------ No results found for: BNP   Roxan Hockey M.D on 01/20/2021 at 6:45 PM  Go to [email or web address] - for contact info  [facility] - Office  [phone]

## 2021-01-20 NOTE — Evaluation (Signed)
Physical Therapy Evaluation Patient Details Name: Devin [name] MRN: [id number] DOB: 21-Nov-1945 Today's Date: 01/20/2021   History of Present Illness  [name]  is a 75 y.o. male, with history of coronary artery disease, GERD, hypertension, stenosis of carotid artery, COPD, TIA, and more presents the ED with chief complaint of fall.  Unfortunately patient is not able to provide any history.  He opens his eyes to light touch, but then he talks incoherently.  This has been his mentation since he arrived in the ED.  His wife apparently was here when he first arrived.  She told staff that he has been fatigued, generalized weakness for the past 2 days.  He went to sit on the couch and slid off last night.  She was not able to get him up, so he laid on the floor for 24 hours.  She did give him a pillow.  Today she called EMS to help get him up.  No further history of present illness in chart review at this time, as EDP note is not done yet.   Clinical Impression  Patient demonstrates slow labored movement for sitting up at bedside with frequent falling backwards due to posterior leaning, has to support self with BLE while seated with poor carryover for reach for RW during attempts to sit to stand, had to place patient's hands on RW and patient limited to standing for a few seconds before losing balance and having to sit.  Patient put back to bed after therapy with 2 person Max assist to reposition in bed.  Patient will benefit from continued physical therapy in hospital and recommended venue below to increase strength, balance, endurance for safe ADLs and gait.      Follow Up Recommendations SNF    Equipment Recommendations  Rolling walker with 5" wheels    Recommendations for Other Services       Precautions / Restrictions Precautions Precautions: Fall Restrictions Weight Bearing Restrictions: No      Mobility  Bed Mobility Overal bed mobility: Needs Assistance Bed Mobility:  Supine to Sit;Sit to Supine     Supine to sit: Max assist Sit to supine: Max assist   General bed mobility comments: labored movement, increased time    Transfers Overall transfer level: Needs assistance Equipment used: Rolling walker (2 wheeled) Transfers: Sit to/from Stand Sit to Stand: Max assist         General transfer comment: very unsteady on feet and unable to maintain standing balance using RW  Ambulation/Gait                Stairs            Wheelchair Mobility    Modified Rankin (Stroke Patients Only)       Balance Overall balance assessment: Needs assistance Sitting-balance support: Feet supported;Bilateral upper extremity supported Sitting balance-Leahy Scale: Poor Sitting balance - Comments: frequent falling backwards Postural control: Posterior lean Standing balance support: During functional activity;Bilateral upper extremity supported Standing balance-Leahy Scale: Poor Standing balance comment: using RW                             Pertinent Vitals/Pain Pain Assessment: No/denies pain    Home Living Family/patient expects to be discharged to:: Private residence Living Arrangements: Spouse/significant other Available Help at Discharge: Family;Available PRN/intermittently Type of Home: House Home Access: Stairs to enter Entrance Stairs-Rails: Right Entrance Stairs-Number of Steps: 3 Home Layout: One level  Additional Comments: Patient is poor historian, info taken from previous admission    Prior Function Level of Independence: Needs assistance   Gait / Transfers Assistance Needed: household distances without AD, "per patient"  ADL's / Homemaking Assistance Needed: assisted by family        Hand Dominance   Dominant Hand: Right    Extremity/Trunk Assessment   Upper Extremity Assessment Upper Extremity Assessment: Generalized weakness    Lower Extremity Assessment Lower Extremity Assessment: Generalized  weakness    Cervical / Trunk Assessment Cervical / Trunk Assessment: Normal  Communication   Communication: Other (comment) (appears slightly confused with difficulty answering questions)  Cognition Arousal/Alertness: Awake/alert Behavior During Therapy: Anxious Overall Cognitive Status: No family/caregiver present to determine baseline cognitive functioning                                        General Comments      Exercises     Assessment/Plan    PT Assessment Patient needs continued PT services  PT Problem List Decreased strength;Decreased activity tolerance;Decreased balance;Decreased mobility       PT Treatment Interventions DME instruction;Gait training;Stair training;Functional mobility training;Therapeutic activities;Therapeutic exercise;Patient/family education;Balance training    PT Goals (Current goals can be found in the Care Plan section)  Acute Rehab PT Goals Patient Stated Goal: return home PT Goal Formulation: With patient Time For Goal Achievement: 02/03/21 Potential to Achieve Goals: Good    Frequency Min 3X/week   Barriers to discharge        Co-evaluation               AM-PAC PT "6 Clicks" Mobility  Outcome Measure Help needed turning from your back to your side while in a flat bed without using bedrails?: A Lot Help needed moving from lying on your back to sitting on the side of a flat bed without using bedrails?: A Lot Help needed moving to and from a bed to a chair (including a wheelchair)?: Total Help needed standing up from a chair using your arms (e.g., wheelchair or bedside chair)?: A Lot Help needed to walk in hospital room?: Total Help needed climbing 3-5 steps with a railing? : Total 6 Click Score: 9    End of Session Equipment Utilized During Treatment: Oxygen Activity Tolerance: Patient tolerated treatment well;Patient limited by fatigue Patient left: in bed;with call bell/phone within reach;with  nursing/sitter in room Nurse Communication: Mobility status PT Visit Diagnosis: Unsteadiness on feet (R26.81);Other abnormalities of gait and mobility (R26.89);Muscle weakness (generalized) (M62.81)    Time: [id number] PT Time Calculation (min) (ACUTE ONLY): 23 min   Charges:   PT Evaluation $PT Eval Moderate Complexity: 1 Mod PT Treatments $Therapeutic Activity: 23-37 mins        1:49 PM, 01/20/21 Lonell Grandchild, MPT Physical Therapist with [facility] 336 [phone] office [phone] mobile phone

## 2021-01-20 NOTE — NC FL2 (Signed)
[place] LEVEL OF CARE SCREENING TOOL     IDENTIFICATION  Patient Name: Devin [name] Birthdate: June 19, 1945 Sex: male Admission Date (Current Location): 01/25/2021  [facility] and [place] Number:  [employer] and Address:  [place] [address], [place]      Provider Number: [id number]  Attending Physician Name and Address:  Roxan Hockey, MD  Relative Name and Phone Number:       Current Level of Care: Hospital Recommended Level of Care: [place] Prior Approval Number:    Date Approved/Denied:   PASRR Number: [id number]  Discharge Plan: SNF    Current Diagnoses: Patient Active Problem List   Diagnosis Date Noted   CAP (community acquired pneumonia) 01/03/2021   Hypertension 01/13/2021   Hypernatremia 01/13/2021   Hypercalcemia 01/13/2021   Dehydration 12/31/2020   Hypokalemia [id number]   Acute metabolic encephalopathy [id number]   Critical lower limb ischemia ([place]) 07/11/2018   PAD (peripheral artery disease) ([place]) 07/03/2017   Carotid artery disease without cerebral infarction ([place]) 02/23/2012   S/P shoulder surgery 02/15/2011   Peripheral vascular disease with claudication ([place]) 02/09/2011   S/P arthroscopy of shoulder 01/10/2011   Shoulder arthritis 01/10/2011   Nontraumatic rupture of tendons of biceps (long head) 01/10/2011   Synovitis of shoulder 01/10/2011   Arthritis, shoulder region 12/30/2010   Biceps tendinopathy 12/30/2010   Rotator cuff tendonitis 12/30/2010   Shoulder pain 12/15/2010   Rotator cuff syndrome 12/15/2010   Rotator cuff tear, right 12/15/2010   Disorders of bursae and tendons in shoulder region, unspecified 11/09/2010   Hyperlipidemia 02/19/2009   CAD, ARTERY BYPASS GRAFT 02/19/2009   TRANSIENT ISCHEMIC ATTACKS, HX OF 02/19/2009   CARPAL TUNNEL SYNDROME 02/13/2008    Orientation RESPIRATION BLADDER Height & Weight     Self  O2 (2L) External catheter  Weight: 145 lb 15.1 oz (66.2 kg) Height:  '5\' 9"'$  (175.3 cm)  BEHAVIORAL SYMPTOMS/MOOD NEUROLOGICAL BOWEL NUTRITION STATUS      Incontinent Diet (Heart healthy. See d/c summary for updates.)  AMBULATORY STATUS COMMUNICATION OF NEEDS Skin   Extensive Assist Verbally Skin abrasions, Bruising                       Personal Care Assistance Level of Assistance  Bathing, Feeding, Dressing Bathing Assistance: Maximum assistance Feeding assistance: Limited assistance Dressing Assistance: Maximum assistance     Functional Limitations Info  Sight, Hearing, Speech Sight Info: Adequate Hearing Info: Impaired Speech Info: Adequate    SPECIAL CARE FACTORS FREQUENCY  PT (By licensed PT)     PT Frequency: 5x weekly              Contractures      Additional Factors Info  Code Status, Allergies Code Status Info: Full code Allergies Info: Aspirin           Current Medications (01/20/2021):  This is the current hospital active medication list Current Facility-Administered Medications  Medication Dose Route Frequency Provider Last Rate Last Admin   0.45 % sodium chloride infusion   Intravenous Continuous Emokpae, Courage, MD 75 mL/hr at 01/20/21 1516 New Bag at 01/20/21 1516   albuterol (PROVENTIL) (2.5 MG/3ML) 0.083% nebulizer solution 2.5 mg  2.5 mg Nebulization Q6H PRN Zierle-Ghosh, Asia B, DO       amLODipine (NORVASC) tablet 10 mg  10 mg Oral Daily Zierle-Ghosh, Asia B, DO   10 mg at 01/20/21 0921   atorvastatin (LIPITOR) tablet 20  mg  20 mg Oral Daily Zierle-Ghosh, Asia B, DO   20 mg at 01/20/21 0921   azithromycin (ZITHROMAX) 500 mg in sodium chloride 0.9 % 250 mL IVPB  500 mg Intravenous Q24H Zierle-Ghosh, Asia B, DO   Stopped at 12/28/2020 2305   cefTRIAXone (ROCEPHIN) 1 g in sodium chloride 0.9 % 100 mL IVPB  1 g Intravenous Q24H Zierle-Ghosh, Asia B, DO       ferrous sulfate tablet 325 mg  325 mg Oral Q breakfast Zierle-Ghosh, Asia B, DO   325 mg at 01/20/21 [id number]    ipratropium-albuterol (DUONEB) 0.5-2.5 (3) MG/3ML nebulizer solution 3 mL  3 mL Nebulization Q6H Zierle-Ghosh, Asia B, DO   3 mL at 01/20/21 1434   methylPREDNISolone sodium succinate (SOLU-MEDROL) 125 mg/2 mL injection 125 mg  125 mg Intravenous Q24H Zierle-Ghosh, Asia B, DO   125 mg at 01/20/21 0538   mometasone-formoterol (DULERA) 200-5 MCG/ACT inhaler 2 puff  2 puff Inhalation BID Zierle-Ghosh, Asia B, DO   2 puff at 01/20/21 0900   vitamin B-12 (CYANOCOBALAMIN) tablet 1,000 mcg  1,000 mcg Oral Daily Zierle-Ghosh, Asia B, DO   1,000 mcg at 01/20/21 [id number]     Discharge Medications: Please see discharge summary for a list of discharge medications.  Relevant Imaging Results:  Relevant Lab Results:   Additional Information SSN: [id number]. Pt received Moderna COVID vaccines on 07/25/19 and 08/23/19. Wife also said he has received 2 boosters.  Salome Arnt, LCSW

## 2021-01-20 NOTE — Care Management Important Message (Signed)
Important Message  Patient Details  Name: Devin [name] MRN: [id number] Date of Birth: [birth year]   Medicare Important Message Given:  Yes     Tommy Medal 01/20/2021, 4:03 PM

## 2021-01-20 NOTE — TOC Initial Note (Signed)
Transition of Care [facility]) - Initial/Assessment Note    Patient Details  Name: Devin [name] MRN: [id number] Date of Birth: 14-Nov-1945  [facility] CM/SW Contact:    Salome Arnt, [place] Phone Number: 01/20/2021, 3:51 PM  Clinical Narrative:   Pt admitted due to community-acquired pneumonia. Assessment completed with pt's wife. She reports pt ambulates with cane at baseline. PT evaluated pt and recommend SNF. Pt's wife states they can manage at home and is not interested. However, TOC informed pt that he required 2 person assist to reposition in bed. She insists that their two sons live next door and are able to assist as needed. Pt's wife agreed to LCSW discussing d/c plan with sons and provided contact information. [place] [phone] and Cornelia Copa [phone]). [place] [facility] contacted Cornelia Copa who then notified TOC that pt will have to go to SNF as they are unable to provide as much assistance as necessary at this time. Will initiate bed search and auth.                 Expected Discharge Plan: Skilled Nursing Facility Barriers to Discharge: Continued Medical Work up   Patient Goals and CMS Choice     Choice offered to / list presented to : Spouse  Expected Discharge Plan and Services Expected Discharge Plan: [place] In-house Referral: Clinical Social Work     Living arrangements for the past 2 months: Single Family Home                 DME Arranged: N/A                    Prior Living Arrangements/Services Living arrangements for the past 2 months: Single Family Home Lives with:: Spouse Patient language and need for interpreter reviewed:: Yes        Need for Family Participation in Patient Care: Yes (Comment)   Current home services: DME (cane) Criminal Activity/Legal Involvement Pertinent to Current Situation/Hospitalization: No - Comment as needed  Activities of Daily Living Home Assistive Devices/Equipment: None ADL Screening  (condition at time of admission) Patient's cognitive ability adequate to safely complete daily activities?: Yes Is the patient deaf or have difficulty hearing?: Yes Does the patient have difficulty seeing, even when wearing glasses/contacts?: Yes Does the patient have difficulty concentrating, remembering, or making decisions?: No Patient able to express need for assistance with ADLs?: Yes Does the patient have difficulty dressing or bathing?: No Independently performs ADLs?: Yes (appropriate for developmental age) Does the patient have difficulty walking or climbing stairs?: No Weakness of Legs: Both Weakness of Arms/Hands: Both  Permission Sought/Granted                  Emotional Assessment       Orientation: : Oriented to Self Alcohol / Substance Use: Not Applicable Psych Involvement: No (comment)  Admission diagnosis:  Encephalopathy [G93.40] CAP (community acquired pneumonia) [J18.9] Community acquired pneumonia of right upper lobe of lung [J18.9] Patient Active Problem List   Diagnosis Date Noted   CAP (community acquired pneumonia) 01/07/2021   Hypertension 01/25/2021   Hypernatremia 12/28/2020   Hypercalcemia 01/13/2021   Dehydration 01/12/2021   Hypokalemia [id number]   Acute metabolic encephalopathy A999333   Critical lower limb ischemia ([place]) 07/11/2018   PAD (peripheral artery disease) ([place]) 07/03/2017   Carotid artery disease without cerebral infarction ([place]) 02/23/2012   S/P shoulder surgery 02/15/2011   Peripheral vascular disease with claudication ([place]) 02/09/2011   S/P arthroscopy of  shoulder 01/10/2011   Shoulder arthritis 01/10/2011   Nontraumatic rupture of tendons of biceps (long head) 01/10/2011   Synovitis of shoulder 01/10/2011   Arthritis, shoulder region 12/30/2010   Biceps tendinopathy 12/30/2010   Rotator cuff tendonitis 12/30/2010   Shoulder pain 12/15/2010   Rotator cuff syndrome 12/15/2010   Rotator cuff tear, right 12/15/2010    Disorders of bursae and tendons in shoulder region, unspecified 11/09/2010   Hyperlipidemia 02/19/2009   CAD, ARTERY BYPASS GRAFT 02/19/2009   TRANSIENT ISCHEMIC ATTACKS, HX OF 02/19/2009   CARPAL TUNNEL SYNDROME 02/13/2008   PCP:  [name], MD Pharmacy:   [facility] [phone] - [place], [place] AT [place] [id number] [place] [address] Phone: [phone] Fax: [phone]     Social Determinants of Health (SDOH) Interventions    Readmission Risk Interventions No flowsheet data found.

## 2021-01-21 ENCOUNTER — Other Ambulatory Visit: Payer: Self-pay

## 2021-01-21 ENCOUNTER — Inpatient Hospital Stay (HOSPITAL_COMMUNITY): Payer: Medicare Other

## 2021-01-21 DIAGNOSIS — D696 Thrombocytopenia, unspecified: Secondary | ICD-10-CM

## 2021-01-21 DIAGNOSIS — J189 Pneumonia, unspecified organism: Secondary | ICD-10-CM

## 2021-01-21 DIAGNOSIS — G9341 Metabolic encephalopathy: Secondary | ICD-10-CM | POA: Diagnosis not present

## 2021-01-21 DIAGNOSIS — E870 Hyperosmolality and hypernatremia: Secondary | ICD-10-CM | POA: Diagnosis not present

## 2021-01-21 LAB — CBC WITH DIFFERENTIAL/PLATELET
Abs Immature Granulocytes: 0.64 10*3/uL — ABNORMAL HIGH (ref 0.00–0.07)
Basophils Absolute: 0 10*3/uL (ref 0.0–0.1)
Basophils Relative: 0 %
Eosinophils Absolute: 0.1 10*3/uL (ref 0.0–0.5)
Eosinophils Relative: 1 %
HCT: 37.7 % — ABNORMAL LOW (ref 39.0–52.0)
Hemoglobin: 12.1 g/dL — ABNORMAL LOW (ref 13.0–17.0)
Immature Granulocytes: 6 %
Lymphocytes Relative: 10 %
Lymphs Abs: 1.1 10*3/uL (ref 0.7–4.0)
MCH: 30.8 pg (ref 26.0–34.0)
MCHC: 32.1 g/dL (ref 30.0–36.0)
MCV: 95.9 fL (ref 80.0–100.0)
Monocytes Absolute: 0.9 10*3/uL (ref 0.1–1.0)
Monocytes Relative: 8 %
Neutro Abs: 7.9 10*3/uL — ABNORMAL HIGH (ref 1.7–7.7)
Neutrophils Relative %: 75 %
Platelets: 30 10*3/uL — ABNORMAL LOW (ref 150–400)
RBC: 3.93 MIL/uL — ABNORMAL LOW (ref 4.22–5.81)
RDW: 13.4 % (ref 11.5–15.5)
WBC: 10.7 10*3/uL — ABNORMAL HIGH (ref 4.0–10.5)
nRBC: 0.4 % — ABNORMAL HIGH (ref 0.0–0.2)

## 2021-01-21 LAB — COMPREHENSIVE METABOLIC PANEL
ALT: 38 U/L (ref 0–44)
AST: 49 U/L — ABNORMAL HIGH (ref 15–41)
Albumin: 3.3 g/dL — ABNORMAL LOW (ref 3.5–5.0)
Alkaline Phosphatase: 140 U/L — ABNORMAL HIGH (ref 38–126)
Anion gap: 15 (ref 5–15)
BUN: 28 mg/dL — ABNORMAL HIGH (ref 8–23)
CO2: 22 mmol/L (ref 22–32)
Calcium: 10.8 mg/dL — ABNORMAL HIGH (ref 8.9–10.3)
Chloride: 110 mmol/L (ref 98–111)
Creatinine, Ser: 0.89 mg/dL (ref 0.61–1.24)
GFR, Estimated: >60 mL/min (ref >60)
Glucose, Bld: 120 mg/dL — ABNORMAL HIGH (ref 70–99)
Potassium: 3.2 mmol/L — ABNORMAL LOW (ref 3.5–5.1)
Sodium: 147 mmol/L — ABNORMAL HIGH (ref 135–145)
Total Bilirubin: 1 mg/dL (ref 0.3–1.2)
Total Protein: 6.7 g/dL (ref 6.5–8.1)

## 2021-01-21 LAB — GLUCOSE, CAPILLARY: Glucose-Capillary: 105 mg/dL — ABNORMAL HIGH (ref 70–99)

## 2021-01-21 LAB — TROPONIN I (HIGH SENSITIVITY)
Troponin I (High Sensitivity): 535 ng/L (ref <18)
Troponin I (High Sensitivity): 786 ng/L (ref <18)

## 2021-01-21 LAB — PATHOLOGIST SMEAR REVIEW

## 2021-01-21 MED ORDER — QUETIAPINE FUMARATE 25 MG PO TABS
25.0000 mg | ORAL_TABLET | Freq: Once | ORAL | Status: AC
  Administered 2021-01-21: 25 mg via ORAL
  Filled 2021-01-21: qty 1

## 2021-01-21 MED ORDER — IMMUNE GLOBULIN (HUMAN) 10 GM/100ML IV SOLN
1.0000 g/kg | INTRAVENOUS | Status: AC
  Administered 2021-01-21 – 2021-01-22 (×2): 65 g via INTRAVENOUS
  Filled 2021-01-21 (×2): qty 650

## 2021-01-21 MED ORDER — LORAZEPAM 2 MG/ML IJ SOLN
0.5000 mg | Freq: Once | INTRAMUSCULAR | Status: AC
  Administered 2021-01-21: 0.5 mg via INTRAVENOUS
  Filled 2021-01-21: qty 1

## 2021-01-21 MED ORDER — IOHEXOL 350 MG/ML SOLN
85.0000 mL | Freq: Once | INTRAVENOUS | Status: AC | PRN
  Administered 2021-01-21: 85 mL via INTRAVENOUS

## 2021-01-21 MED ORDER — SODIUM CHLORIDE 0.9 % IV SOLN
2.0000 g | INTRAVENOUS | Status: DC
  Administered 2021-01-21 – 2021-01-22 (×2): 2 g via INTRAVENOUS
  Filled 2021-01-21 (×2): qty 20

## 2021-01-21 MED ORDER — CHLORHEXIDINE GLUCONATE CLOTH 2 % EX PADS
6.0000 | MEDICATED_PAD | Freq: Every day | CUTANEOUS | Status: DC
  Administered 2021-01-21 – 2021-01-25 (×6): 6 via TOPICAL

## 2021-01-21 MED ORDER — LEVALBUTEROL HCL 1.25 MG/0.5ML IN NEBU
1.2500 mg | INHALATION_SOLUTION | Freq: Four times a day (QID) | RESPIRATORY_TRACT | Status: DC
  Administered 2021-01-21: 1.25 mg via RESPIRATORY_TRACT
  Filled 2021-01-21: qty 0.5

## 2021-01-21 MED ORDER — HYDRALAZINE HCL 20 MG/ML IJ SOLN: 10.0000 mg | Freq: Four times a day (QID) | INTRAMUSCULAR | Status: DC | PRN

## 2021-01-21 MED ORDER — IPRATROPIUM-ALBUTEROL 0.5-2.5 (3) MG/3ML IN SOLN
3.0000 mL | Freq: Three times a day (TID) | RESPIRATORY_TRACT | Status: DC
  Administered 2021-01-21 (×2): 3 mL via RESPIRATORY_TRACT
  Filled 2021-01-21: qty 3

## 2021-01-21 MED ORDER — POTASSIUM CHLORIDE 10 MEQ/100ML IV SOLN
10.0000 meq | INTRAVENOUS | Status: AC
  Administered 2021-01-21 (×4): 10 meq via INTRAVENOUS
  Filled 2021-01-21 (×4): qty 100

## 2021-01-21 MED ORDER — QUETIAPINE FUMARATE 25 MG PO TABS: 50.0000 mg | ORAL_TABLET | Freq: Every day | ORAL | Status: DC

## 2021-01-21 MED ORDER — SODIUM CHLORIDE 0.9 % IV BOLUS
500.0000 mL | Freq: Once | INTRAVENOUS | Status: AC
  Administered 2021-01-21: 500 mL via INTRAVENOUS

## 2021-01-21 MED ORDER — POTASSIUM CHLORIDE CRYS ER 20 MEQ PO TBCR
40.0000 meq | EXTENDED_RELEASE_TABLET | ORAL | Status: DC
  Filled 2021-01-21: qty 2

## 2021-01-21 MED ORDER — QUETIAPINE FUMARATE 25 MG PO TABS: 25.0000 mg | ORAL_TABLET | Freq: Every day | ORAL | Status: DC

## 2021-01-21 MED ORDER — IOHEXOL 9 MG/ML PO SOLN
ORAL | Status: AC
  Filled 2021-01-21: qty 1000

## 2021-01-21 MED ORDER — DEXTROSE-NACL 5-0.45 % IV SOLN: INTRAVENOUS | Status: DC

## 2021-01-21 NOTE — Consult Note (Signed)
[facility] Consultation Oncology  Name: Devin [name]      MRN: [id number]    Location: A305/A305-01  Date: 01/21/2021 Time:5:43 PM   REFERRING PHYSICIAN: Dr. Joesph Fillers  REASON FOR CONSULT: Severe thrombocytopenia   DIAGNOSIS: Immune mediated platelet destruction versus septic thrombocytopenia  HISTORY OF PRESENT ILLNESS: Devin [name] is a 75 year old white male who is seen in consultation today at the request of Dr. Joesph Fillers for further work-up and management of severe thrombocytopenia.  His wife is at bedside and provides history.  He is asleep as he received Ativan because of agitation.  He reportedly was found slumped over with some change in mental status and was brought to the ER.  He is being treated for community-acquired pneumonia as chest x-ray showed infiltrate in the right lung.  His platelets were low on admission at 30.  Hemoglobin and white count was normal with normal differential.  He had several episodes of mild thrombocytopenia over the last few years.  His wife does not report any bleeding issues although he has occasional easy bruising.  He walks with help of a cane.  He lost mowed his lawn 2 to 3 weeks ago.  He was fully functioning at home and was taking care of family's finances.  He worked in [employer].  He is an ex-smoker, smoked 1 pack/day for more than 50 years.  Currently using nicotine patch.  Family history significant for brother who died of cancer.  His maternal uncle and aunt also died of cancers.  Wife does not know the type.  Sister had protein S deficiency.  PAST MEDICAL HISTORY:   Past Medical History:  Diagnosis Date   Arthritis    Asthma    Carpal tunnel syndrome    Chest pain, unspecified    Chronic kidney disease    hx of kidney stones   Coronary artery disease    Coronary atherosclerosis of artery bypass graft    GERD (gastroesophageal reflux disease)    History of kidney stones    HOH (hard of hearing)    Hypertension    Occlusion and  stenosis of carotid artery without mention of cerebral infarction    Other and unspecified hyperlipidemia    Personal history of unspecified circulatory disease    Shortness of breath    Stroke ([place])    TIA (transient ischemic attack)     ALLERGIES: Allergies  Allergen Reactions   Aspirin Other (See Comments)    Has to take the coated aspirin      MEDICATIONS: I have reviewed the patient's current medications.     PAST SURGICAL HISTORY Past Surgical History:  Procedure Laterality Date   ABDOMINAL AORTOGRAM N/A 06/14/2017   Procedure: ABDOMINAL AORTOGRAM;  Surgeon: Wellington Hampshire, MD;  Location: [place] [facility];  Service: Cardiovascular;  Laterality: N/A;   ABDOMINAL AORTOGRAM N/A 01/26/2018   Procedure: ABDOMINAL AORTOGRAM;  Surgeon: Elam Dutch, MD;  Location: [place] [facility];  Service: Cardiovascular;  Laterality: N/A;   ABDOMINAL AORTOGRAM N/A 07/11/2018   Procedure: ABDOMINAL AORTOGRAM;  Surgeon: Marty Heck, MD;  Location: [place] [facility];  Service: Cardiovascular;  Laterality: N/A;   BACK SURGERY     cervical disc   CARDIAC CATHETERIZATION     CARPAL TUNNEL RELEASE     blateral carpal tunnel   COLONOSCOPY WITH PROPOFOL N/A 02/18/2020   Procedure: COLONOSCOPY WITH PROPOFOL;  Surgeon: Eloise Harman, DO;  [facility];  Service: Endoscopy;  Laterality:  N/A;  7:30   CORONARY ARTERY BYPASS GRAFT     x5 12 yrs ago   ENDARTERECTOMY FEMORAL Right 07/03/2017   Procedure: ENDARTERECTOMY FEMORAL RIGHT;  Surgeon: Elam Dutch, MD;  Location[facility];  Service: Vascular;  Laterality: Right;   FEMORAL-POPLITEAL BYPASS GRAFT Right 07/03/2017   Procedure: BYPASS GRAFT FEMORAL-POPLITEAL ARTERY;  Surgeon: Elam Dutch, MD;  Location: [facility] OR;  Service: Vascular;  Laterality: Right;   FEMORAL-POPLITEAL BYPASS GRAFT Left 02/12/2018   Procedure: BYPASS GRAFT FEMORAL-POPLITEAL ARTERY LEFT LEG;  Surgeon: Elam Dutch, MD;  Location: [place];  Service:  Vascular;  Laterality: Left;   LOWER EXTREMITY ANGIOGRAM Bilateral 08/05/2015   Procedure: Lower Extremity Angiogram;  Surgeon: Wellington Hampshire, MD;  Location: [place] [facility];  Service: Cardiovascular;  Laterality: Bilateral;   LOWER EXTREMITY ANGIOGRAPHY Bilateral 06/14/2017   Procedure: Lower Extremity Angiography;  Surgeon: Wellington Hampshire, MD;  Location: [place] [facility];  Service: Cardiovascular;  Laterality: Bilateral;   LOWER EXTREMITY ANGIOGRAPHY Bilateral 01/26/2018   Procedure: LOWER EXTREMITY ANGIOGRAPHY;  Surgeon: Elam Dutch, MD;  Location: [place] [facility];  Service: Cardiovascular;  Laterality: Bilateral;   LOWER EXTREMITY ANGIOGRAPHY Right 07/11/2018   Procedure: LOWER EXTREMITY ANGIOGRAPHY;  Surgeon: Marty Heck, MD;  Location: [place] [facility];  Service: Cardiovascular;  Laterality: Right;  thrombolysis fem/tib bypass graft   NECK SURGERY     PATCH ANGIOPLASTY Right 07/03/2017   Procedure: VEIN PATCH ANGIOPLASTY OF FEMORAL AND POSTERIOR TIBIAL ARTERIES;  Surgeon: Elam Dutch, MD;  Location: [place];  Service: Vascular;  Laterality: Right;   PERIPHERAL VASCULAR BALLOON ANGIOPLASTY Right 07/12/2018   Procedure: PERIPHERAL VASCULAR BALLOON ANGIOPLASTY;  Surgeon: Marty Heck, MD;  Location: [place] [facility];  Service: Cardiovascular;  Laterality: Right;  Right fem posterior tibial graft and posterior tibial artery.   PERIPHERAL VASCULAR BALLOON ANGIOPLASTY Right 07/13/2018   Procedure: PERIPHERAL VASCULAR BALLOON ANGIOPLASTY;  Surgeon: Elam Dutch, MD;  Location: [place] [facility];  Service: Cardiovascular;  Laterality: Right;  PT   PERIPHERAL VASCULAR CATHETERIZATION N/A 08/05/2015   Procedure: Abdominal Aortogram;  Surgeon: Wellington Hampshire, MD;  Location: [place] [facility];  Service: Cardiovascular;  Laterality: N/A;   PERIPHERAL VASCULAR THROMBECTOMY N/A 07/12/2018   Procedure: PERIPHERAL VASCULAR THROMBECTOMY - LYSIS RECHECK;  Surgeon:  [place] Heck, MD;  Location: [place] [facility];  Service: Cardiovascular;  Laterality: N/A;   PERIPHERAL VASCULAR THROMBECTOMY Right 07/13/2018   Procedure: PERIPHERAL VASCULAR THROMBECTOMY;  Surgeon: Elam Dutch, MD;  Location: [place] [facility];  Service: Cardiovascular;  Laterality: Right;   POLYPECTOMY  02/18/2020   Procedure: POLYPECTOMY;  Surgeon: Eloise Harman, DO;  [facility];  Service: Endoscopy;;   SHOULDER ARTHROSCOPY W/ ROTATOR CUFF REPAIR     Right   VEIN HARVEST Left 02/12/2018   Procedure: VEIN HARVEST;  Surgeon: Elam Dutch, MD;  Location: [facility] OR;  Service: Vascular;  Laterality: Left;    FAMILY HISTORY: Family History  Problem Relation Age of Onset   Heart disease Other    Arthritis Other    Asthma Other    Coronary artery disease Other    Anesthesia problems Neg Hx    Hypotension Neg Hx    Malignant hyperthermia Neg Hx    Pseudochol deficiency Neg Hx     SOCIAL HISTORY:  reports that he quit smoking about 3 years ago. His smoking use included cigarettes and cigars. He  has a 50.00 pack-year smoking history. He has never used smokeless tobacco. He reports that he does not drink alcohol and does not use drugs.  PERFORMANCE STATUS: The patient's performance status is 3 - Symptomatic, >50% confined to bed  PHYSICAL EXAM: Most Recent Vital Signs: Blood pressure (!) 160/88, pulse 99, temperature 98.2 F (36.8 C), temperature source Axillary, resp. rate (!) 32, height '5\' 9"'$  (1.753 m), weight 145 lb 15.1 oz (66.2 kg), SpO2 94 %. BP (!) 160/88 (BP Location: Right Arm)   Pulse 99   Temp 98.2 F (36.8 C) (Axillary)   Resp (!) 32   Ht '5\' 9"'$  (1.753 m)   Wt 145 lb 15.1 oz (66.2 kg)   SpO2 94%   BMI 21.55 kg/m  General appearance: appears stated age and delirious Head: Normocephalic, without obvious abnormality, atraumatic Neck: no adenopathy Lungs:  Bilateral air entry with no wheezing. Abdomen:  There is a diffuse tenderness  although left more than right side.  No clearly palpable masses. Extremities:  No edema was seen. Lymph nodes: Cervical, supraclavicular, and axillary nodes normal.  LABORATORY DATA:  Results for orders placed or performed during the hospital encounter of 01/17/2021 (from the past 48 hour(s))  Lactic acid, plasma     Status: None   Collection Time: 01/13/2021  6:35 PM  Result Value Ref Range   Lactic Acid, Venous 1.5 0.5 - 1.9 mmol/L    Comment: Performed at [facility], [address] [place], [place] [address]  Blood culture (routine x 2)     Status: None (Preliminary result)   Collection Time: 01/03/2021  6:35 PM   Specimen: BLOOD RIGHT HAND  Result Value Ref Range   Specimen Description BLOOD RIGHT HAND    Special Requests      BOTTLES DRAWN AEROBIC AND ANAEROBIC Blood Culture adequate volume   Culture      NO GROWTH 2 DAYS Performed at [facility], [address], [place], [place] [address]    Report Status PENDING   Blood culture (routine x 2)     Status: None (Preliminary result)   Collection Time: 01/20/2021  6:35 PM   Specimen: BLOOD LEFT HAND  Result Value Ref Range   Specimen Description BLOOD LEFT HAND    Special Requests      BOTTLES DRAWN AEROBIC AND ANAEROBIC Blood Culture adequate volume   Culture      NO GROWTH 2 DAYS Performed at [facility], [address], [place], [place] [address]    Report Status PENDING   Blood gas, venous (at [facility], not at [facility])     Status: Abnormal   Collection Time: 01/09/2021  6:35 PM  Result Value Ref Range   FIO2 21.00    pH, Ven 7.452 (H) 7.250 - 7.430   pCO2, Ven 36.8 (L) 44.0 - 60.0 mmHg   pO2, Ven 91.1 (H) 32.0 - 45.0 mmHg   Bicarbonate 26.1 20.0 - 28.0 mmol/L   Acid-Base Excess 1.7 0.0 - 2.0 mmol/L   O2 Saturation 97.2 %   Patient temperature 36.8     Comment: Performed at [facility], [address] [place], [place] [address]  Resp Panel by RT-PCR (Flu A&B, Covid) Nasopharyngeal Swab     Status: None   Collection Time:  01/18/2021  6:37 PM   Specimen: Nasopharyngeal Swab; Nasopharyngeal(NP) swabs in vial transport medium  Result Value Ref Range   SARS Coronavirus 2 by RT PCR NEGATIVE NEGATIVE    Comment: (NOTE) SARS-CoV-2 target  nucleic acids are NOT DETECTED.  The SARS-CoV-2 RNA is generally detectable in upper respiratory specimens during the acute phase of infection. The lowest concentration of SARS-CoV-2 viral copies this assay can detect is 138 copies/mL. A negative result does not preclude SARS-Cov-2 infection and should not be used as the sole basis for treatment or other patient management decisions. A negative result may occur with  improper specimen collection/handling, submission of specimen other than nasopharyngeal swab, presence of viral mutation(s) within the areas targeted by this assay, and inadequate number of viral copies(<138 copies/mL). A negative result must be combined with clinical observations, patient history, and epidemiological information. The expected result is Negative.  Fact Sheet for Patients:  EntrepreneurPulse.com.au  Fact Sheet for Healthcare Providers:  IncredibleEmployment.be  This test is no t yet approved or cleared by the [place] FDA and  has been authorized for detection and/or diagnosis of SARS-CoV-2 by FDA under an Emergency Use Authorization (EUA). This EUA will remain  in effect (meaning this test can be used) for the duration of the COVID-19 declaration under Section 564(b)(1) of the Act, 21 U.S.C.section 360bbb-3(b)(1), unless the authorization is terminated  or revoked sooner.       Influenza A by PCR NEGATIVE NEGATIVE   Influenza B by PCR NEGATIVE NEGATIVE    Comment: (NOTE) The Xpert Xpress SARS-CoV-2/FLU/RSV plus assay is intended as an aid in the diagnosis of influenza from Nasopharyngeal swab specimens and should not be used as a sole basis for treatment. Nasal washings and aspirates are  unacceptable for Xpert Xpress SARS-CoV-2/FLU/RSV testing.  Fact Sheet for Patients: EntrepreneurPulse.com.au  Fact Sheet for Healthcare Providers: IncredibleEmployment.be  This test is not yet approved or cleared by the [place] FDA and has been authorized for detection and/or diagnosis of SARS-CoV-2 by FDA under an Emergency Use Authorization (EUA). This EUA will remain in effect (meaning this test can be used) for the duration of the COVID-19 declaration under Section 564(b)(1) of the Act, 21 U.S.C. section 360bbb-3(b)(1), unless the authorization is terminated or revoked.  Performed at [facility], [address] [place], [place] [address]   Troponin I (High Sensitivity)     Status: Abnormal   Collection Time: 01/02/2021  8:21 PM  Result Value Ref Range   Troponin I (High Sensitivity) 41 (H) <18 ng/L    Comment: (NOTE) Elevated high sensitivity troponin I (hsTnI) values and significant  changes across serial measurements may suggest ACS but many other  chronic and acute conditions are known to elevate hsTnI results.  Refer to the "Links" section for chest pain algorithms and additional  guidance. Performed at [facility], [address] [place], [place] [address]   HIV Antibody (routine testing w rflx)     Status: None   Collection Time: 01/20/21 12:08 AM  Result Value Ref Range   HIV Screen 4th Generation wRfx Non Reactive Non Reactive    Comment: Performed at [facility], [place] [address], [place], [place] [id number]  Basic metabolic panel     Status: Abnormal   Collection Time: 01/20/21 12:08 AM  Result Value Ref Range   Sodium 149 (H) 135 - 145 mmol/L   Potassium 3.8 3.5 - 5.1 mmol/L   Chloride 115 (H) 98 - 111 mmol/L   CO2 24 22 - 32 mmol/L   Glucose, Bld 129 (H) 70 - 99 mg/dL    Comment: Glucose reference range applies only to samples taken after fasting for at least 8 hours.   BUN  31 (H) 8 - 23 mg/dL   Creatinine, Ser  0.93 0.61 - 1.24 mg/dL   Calcium 10.7 (H) 8.9 - 10.3 mg/dL   GFR, Estimated >60 >60 mL/min    Comment: (NOTE) Calculated using the CKD-EPI Creatinine Equation (2021)    Anion gap 10 5 - 15    Comment: Performed at [facility], [address], [place], [place] [address]  CBC     Status: Abnormal   Collection Time: 01/20/21  4:45 AM  Result Value Ref Range   WBC 5.4 4.0 - 10.5 K/uL   RBC 3.62 (L) 4.22 - 5.81 MIL/uL   Hemoglobin 11.5 (L) 13.0 - 17.0 g/dL   HCT 35.3 (L) 39.0 - 52.0 %   MCV 97.5 80.0 - 100.0 fL   MCH 31.8 26.0 - 34.0 pg   MCHC 32.6 30.0 - 36.0 g/dL   RDW 13.7 11.5 - 15.5 %   Platelets 26 (LL) 150 - 400 K/uL    Comment: SPECIMEN CHECKED FOR CLOTS Immature Platelet Fraction may be clinically indicated, consider ordering this additional test [id number] CONSISTENT WITH PREVIOUS RESULT REPEATED TO VERIFY    nRBC 0.0 0.0 - 0.2 %    Comment: Performed at [facility], [address] [place], [place] [address]  Comprehensive metabolic panel     Status: Abnormal   Collection Time: 01/20/21  4:45 AM  Result Value Ref Range   Sodium 148 (H) 135 - 145 mmol/L   Potassium 3.4 (L) 3.5 - 5.1 mmol/L   Chloride 113 (H) 98 - 111 mmol/L   CO2 22 22 - 32 mmol/L   Glucose, Bld 134 (H) 70 - 99 mg/dL    Comment: Glucose reference range applies only to samples taken after fasting for at least 8 hours.   BUN 30 (H) 8 - 23 mg/dL   Creatinine, Ser 0.88 0.61 - 1.24 mg/dL   Calcium 10.8 (H) 8.9 - 10.3 mg/dL   Total Protein 6.7 6.5 - 8.1 g/dL   Albumin 3.3 (L) 3.5 - 5.0 g/dL   AST 38 15 - 41 U/L   ALT 29 0 - 44 U/L   Alkaline Phosphatase 138 (H) 38 - 126 U/L   Total Bilirubin 1.2 0.3 - 1.2 mg/dL   GFR, Estimated >60 >60 mL/min    Comment: (NOTE) Calculated using the CKD-EPI Creatinine Equation (2021)    Anion gap 13 5 - 15    Comment: Performed at [facility], [address] [place], [place] [address]  Vitamin B12     Status: Abnormal   Collection Time: 01/20/21  3:18 PM  Result  Value Ref Range   Vitamin B-12 2,056 (H) 180 - 914 pg/mL    Comment: RESULTS CONFIRMED BY MANUAL DILUTION (NOTE) This assay is not validated for testing neonatal or myeloproliferative syndrome specimens for Vitamin B12 levels. Performed at [facility], [address], [place], [place] [address]   Lactate dehydrogenase     Status: Abnormal   Collection Time: 01/20/21  3:18 PM  Result Value Ref Range   LDH 666 (H) 98 - 192 U/L    Comment: Performed at [facility], [address], [place], [place] [address]  Pathologist smear review     Status: None   Collection Time: 01/20/21  3:18 PM  Result Value Ref Range   Path Review Reviewed by Kalman Drape, M.D.     Comment: 01/21/21 Normocytic anemia with mild polychromasia.  No significant increase in schistocytes.  Thrombocytopenia. Performed at [facility], [place] [address], [place], [place] [address]   Fibrinogen     Status: None   Collection Time: 01/20/21  3:18 PM  Result Value Ref Range   Fibrinogen 219 210 - 475 mg/dL    Comment: (NOTE) Fibrinogen results may be underestimated in patients receiving thrombolytic therapy. Performed at [facility], [address], [place], [place] [address]   APTT     Status: None   Collection Time: 01/20/21  3:18 PM  Result Value Ref Range   aPTT 34 24 - 36 seconds    Comment: Performed at [facility], [address] [place], [place] [address]  Folate     Status: None   Collection Time: 01/20/21  3:18 PM  Result Value Ref Range   Folate 6.3 >5.9 ng/mL    Comment: Performed at [facility], [address], [place], [place] [address]  Glucose, capillary     Status: Abnormal   Collection Time: 01/20/21  4:26 PM  Result Value Ref Range   Glucose-Capillary 129 (H) 70 - 99 mg/dL    Comment: Glucose reference range applies only to samples taken after fasting for at least 8 hours.  CBC with Differential/Platelet     Status: Abnormal   Collection Time: 01/21/21  5:50 AM  Result  Value Ref Range   WBC 10.7 (H) 4.0 - 10.5 K/uL   RBC 3.93 (L) 4.22 - 5.81 MIL/uL   Hemoglobin 12.1 (L) 13.0 - 17.0 g/dL   HCT 37.7 (L) 39.0 - 52.0 %   MCV 95.9 80.0 - 100.0 fL   MCH 30.8 26.0 - 34.0 pg   MCHC 32.1 30.0 - 36.0 g/dL   RDW 13.4 11.5 - 15.5 %   Platelets 30 (L) 150 - 400 K/uL    Comment: SPECIMEN CHECKED FOR CLOTS Immature Platelet Fraction may be clinically indicated, consider ordering this additional test [id number] CONSISTENT WITH PREVIOUS RESULT    nRBC 0.4 (H) 0.0 - 0.2 %   Neutrophils Relative % 75 %   Neutro Abs 7.9 (H) 1.7 - 7.7 K/uL   Lymphocytes Relative 10 %   Lymphs Abs 1.1 0.7 - 4.0 K/uL   Monocytes Relative 8 %   Monocytes Absolute 0.9 0.1 - 1.0 K/uL   Eosinophils Relative 1 %   Eosinophils Absolute 0.1 0.0 - 0.5 K/uL   Basophils Relative 0 %   Basophils Absolute 0.0 0.0 - 0.1 K/uL   Immature Granulocytes 6 %   Abs Immature Granulocytes 0.64 (H) 0.00 - 0.07 K/uL    Comment: Performed at [facility], [address] [place], [place] [address]  Comprehensive metabolic panel     Status: Abnormal   Collection Time: 01/21/21  5:50 AM  Result Value Ref Range   Sodium 147 (H) 135 - 145 mmol/L   Potassium 3.2 (L) 3.5 - 5.1 mmol/L   Chloride 110 98 - 111 mmol/L   CO2 22 22 - 32 mmol/L   Glucose, Bld 120 (H) 70 - 99 mg/dL    Comment: Glucose reference range applies only to samples taken after fasting for at least 8 hours.   BUN 28 (H) 8 - 23 mg/dL   Creatinine, Ser 0.89 0.61 - 1.24 mg/dL   Calcium 10.8 (H) 8.9 - 10.3 mg/dL   Total Protein 6.7 6.5 - 8.1 g/dL   Albumin 3.3 (L) 3.5 - 5.0 g/dL   AST 49 (H) 15 - 41 U/L   ALT 38 0 - 44 U/L   Alkaline Phosphatase 140 (  H) 38 - 126 U/L   Total Bilirubin 1.0 0.3 - 1.2 mg/dL   GFR, Estimated >60 >60 mL/min    Comment: (NOTE) Calculated using the CKD-EPI Creatinine Equation (2021)    Anion gap 15 5 - 15    Comment: Performed at [facility], [address], [place], [place] [address]  Glucose, capillary      Status: Abnormal   Collection Time: 01/21/21 10:46 AM  Result Value Ref Range   Glucose-Capillary 105 (H) 70 - 99 mg/dL    Comment: Glucose reference range applies only to samples taken after fasting for at least 8 hours.      RADIOGRAPHY: CT HEAD WO CONTRAST (5MM)  Result Date: 01/05/2021 CLINICAL DATA:  Mental status changes EXAM: CT HEAD WITHOUT CONTRAST TECHNIQUE: Contiguous axial images were obtained from the base of the skull through the vertex without intravenous contrast. COMPARISON:  11/27/2001 FINDINGS: Brain: There is atrophy and chronic small vessel disease changes. No acute intracranial abnormality. Specifically, no hemorrhage, hydrocephalus, mass lesion, acute infarction, or significant intracranial injury. Vascular: No hyperdense vessel or unexpected calcification. Skull: No acute calvarial abnormality. Sinuses/Orbits: No acute findings Other: None IMPRESSION: Atrophy, chronic microvascular disease. No acute intracranial abnormality. Electronically Signed   By: Rolm Baptise M.D.   On: 01/20/2021 18:58   MR BRAIN WO CONTRAST  Result Date: 01/21/2021 CLINICAL DATA:  Mental status change, unknown cause. EXAM: MRI HEAD WITHOUT CONTRAST TECHNIQUE: Multiplanar, multiecho pulse sequences of the brain and surrounding structures were obtained without intravenous contrast. COMPARISON:  Head CT January 19, 2021 FINDINGS: The study is partially degraded by motion. Brain: No acute infarction, hemorrhage, hydrocephalus, extra-axial collection or mass lesion. Scattered and confluent foci of T2 hyperintensity are seen within the white matter of the cerebral hemispheres nonspecific, most likely related to chronic small vessel ischemia. Moderate parenchymal volume loss. Vascular: Normal flow voids. Skull and upper cervical spine: Negative. Sinuses/Orbits: Bilateral lens surgery. Paranasal sinuses are clear. Other: None. IMPRESSION: 1. Study partially limited by motion. 2. No acute intracranial abnormality.  3. Moderate chronic ischemic changes of the white matter and parenchymal volume. Electronically Signed   By: Pedro Earls M.D.   On: 01/21/2021 13:17   DG CHEST PORT 1 VIEW  Result Date: 01/21/2021 CLINICAL DATA:  Altered level of consciousness, short of breath EXAM: PORTABLE CHEST 1 VIEW COMPARISON:  12/30/2020 FINDINGS: Single frontal view of the chest demonstrates a stable cardiac silhouette. There is chronic background interstitial scarring, with superimposed right perihilar airspace disease that has progressed since prior study. No effusion or pneumothorax. No acute bony abnormalities. IMPRESSION: 1. Increasing right perihilar airspace disease, consistent with acute pneumonia superimposed upon chronic background lung scarring. Electronically Signed   By: Randa Ngo M.D.   On: 01/21/2021 17:34   DG Chest Port 1 View  Result Date: 01/10/2021 CLINICAL DATA:  Cough. EXAM: PORTABLE CHEST 1 VIEW COMPARISON:  Chest x-ray dated July 14, 2018. FINDINGS: Stable cardiomediastinal silhouette status post CABG. Patchy opacities in the right upper lobe. No pleural effusion or pneumothorax. No acute osseous abnormality. IMPRESSION: 1. Right upper lobe pneumonia. Electronically Signed   By: Titus Dubin M.D.   On: 01/12/2021 18:10          ASSESSMENT:  1.  Severe thrombocytopenia: - Platelet count on admission 30 K, decreased to 20 6K on 01/20/2021, 30 K today. - Mild to moderate thrombocytopenia of several years duration. - No active bleeding. - Peripheral blood smear showed 1-2 schistocytes per hpf,  fairly frequent giant platelets.  White cell morphology was normal.  2.  Social/family history: - He worked and retired from [employer].  More than 50-pack-year smoker, currently on nicotine patches. - Lives at home with his wife.  Independent of all ADLs and IADLs. - Brother died of cancer.  Maternal aunt and uncle died of cancer.  Types unknown to patient's wife.  Another sister  had protein S deficiency.  PLAN:  1.  Severe thrombocytopenia: - Peripheral blood smear was not suggestive of TTP.  Fairly frequent giant platelets consistent with immune mediated process. - No active bleeding or bruising.  Brain imaging including MRI of the brain today did not reveal any bleeding. - I have recommended IVIG 1 g/kg x 2 days.  We discussed side effects in detail including hemolysis. - He is already on methylprednisone 40 mg every 12 hours for his COPD. - LDH is elevated at 666.  We will repeat another level tomorrow.  2.  Abdominal tenderness: - Recommend imaging of the abdomen in the form of CT scan.  Discussed with Dr. Joesph Fillers.  3.  Community-acquired pneumonia: - He is already receiving azithromycin and ceftriaxone. - X-ray reviewed by me today showed slight worsening of the pneumonia.  4.  Hypercalcemia: - Calcium is 10.8.  Corrected calcium is 11.4.  His calcium has been elevated during this admission.  Prior to this admission his calcium was normal. - We will start work-up with intact PTH, PTH rP, 125 dihydroxy vitamin D, 25 hydroxy vitamin D, SPEP, free light chains.  All questions were answered. The patient knows to call the clinic with any problems, questions or concerns. We can certainly see the patient much sooner if necessary.   Derek Jack

## 2021-01-21 NOTE — Progress Notes (Signed)
Date and time results received: 01/21/21 8:54 PM  (use smartphrase ".now" to insert current time)  Test: Troponin Critical Value: 535  Name of Provider Notified Dr. Josephine Cables  Orders Received? Or Actions Taken?: awaiting orders

## 2021-01-21 NOTE — Progress Notes (Signed)
Pt disoriented x4, Refused to eat breakfast this morning after several attempts from nursing staff. This nurse crushed medications and put them in apple sauce pt refused to take them. Pt kept swatting nurses hand and grabbing it stating " I don't want that, Get it away from me now". Nurse tried to redirect pt and educate on importance of taking medication. Pt offered Ensure that was also refused. MD notified.

## 2021-01-21 NOTE — Plan of Care (Signed)

## 2021-01-21 NOTE — Progress Notes (Signed)
Patient Demographics:    Devin [name], is a 75 y.o. male, DOB - 1946/04/25, [id number]  Admit date - 01/27/2021   Admitting Physician Demontez Novack Denton Brick, MD  Outpatient Primary MD for the patient is [name], MD  LOS - 2   Chief Complaint  Patient presents with   Altered Mental Status        Subjective:    Devin [name] today has no fevers, no emesis,  No chest pain,   Complains of fatigue  Assessment  & Plan :    Active Problems:   Hyperlipidemia   CAP (community acquired pneumonia)   Hypertension   Hypernatremia   Hypercalcemia   Dehydration   Hypokalemia   Acute metabolic encephalopathy  Brief Summary:-  75 y.o. male, with history of coronary artery disease, GERD, hypertension, stenosis of carotid artery, COPD, TIA admitted on 01/18/2021 with severe sepsis secondary to community-acquired pneumonia and hypoxia and persistent thrombocytopenia  A/p  1) severe sepsis with acute hypoxic respiratory failure in the setting of community-acquired pneumonia (RUL)-- Patient meets sepsis criteria on admission with tachycardia and tachypnea and finding of right upper lobe pneumonia -Continue supplemental oxygen at 2 L/min -HIV negative -WBC trending up (patient is on steroids) -Chest x-ray from 01/21/2021 showed worsening right-sided pneumonia -CT abdomen and pelvis pending -Continue bronchodilators mucolytics and Rocephin with azithromycin  2) severe thrombocytopenia--- discussed with Dr. Delton Coombes -Patient with longstanding history of thrombocytopenia platelets usually around 100 K -Platelets 30 >> 26 >>30 -Pathology smear reviewed  -LDH elevated at 666 -Serum copper pending -B12 is not low, -Folate is normal -PTT is 34, INR is 1.3, Fibrinogen is low normal L at 219 -We will transfuse if platelet count less than 10 K or concerns for bleeding -Stop Pletal -Per Dr. Delton Coombes okay  to give IVIG for possible sepsis induced thrombocytopenia versus ITP  3) chronic anemia-with normal MCV MCH and RDW -Patient hemoglobin usually between 10 and 11, on admission hemoglobin was 13.8 most likely due to dehydration, post hydration will globin is now close to baseline at 11.5 -No obvious bleeding noted  4) generalized weakness and deconditioning--- physical therapy recommends SNF rehab  5) acute metabolic encephalopathy---secondary to #1 above, and #8 below -CT head negative for acute findings -MRI brain without acute finding -Suspect related to infection  6)HTN--stable continue PTA meds  7)HLD-stable, continue PTA meds  8) hypernatremia and hypercalcemia/hypokalemia--may be dehydration related, recheck after further hydration Na 149 >>147 -Continue IV fluids  9) acute COPD exacerbation--- secondary to #1 above, manage as above #1  10)Social/ethics--- discussed with patient's wife, she request full code and full scope of treatment without limitations  11)Persistent Tachycardia- multifactorial in the setting of sepsis, pneumonia with hypoxia, bronchodilator use and agitation -EKG sinus rhythm -Check echo in a.m.  Disposition/Need for in-Hospital Stay- patient unable to be discharged at this time due to severe sepsis secondary to pneumonia requiring IV antibiotics and IV fluid Status is: Inpatient  Remains inpatient appropriate because: Please see disposition above  Disposition: The patient is from: Home              Anticipated d/c is to: SNF              Anticipated d/c date is: 2 days  Patient currently is not medically stable to d/c. Barriers: Not Clinically Stable-   Code Status :  -  Code Status: Full Code   Family Communication:    (patient is alert, awake and coherent)  Discussed with wife at [phone]----and in Person at bedside  Consults  : Dr. Delton Coombes  DVT Prophylaxis  :   - SCDs *  SCDs Start: 01/05/2021 2246    Lab Results   Component Value Date   PLT 30 (L) 01/21/2021    Inpatient Medications  Scheduled Meds:  amLODipine  10 mg Oral Daily   atorvastatin  20 mg Oral Daily   ferrous sulfate  325 mg Oral Q breakfast   levalbuterol  1.25 mg Nebulization QID   methylPREDNISolone (SOLU-MEDROL) injection  40 mg Intravenous Q12H   mometasone-formoterol  2 puff Inhalation BID   QUEtiapine  25 mg Oral QHS   vitamin B-12  1,000 mcg Oral Daily   Continuous Infusions:  azithromycin 500 mg (01/20/21 2207)   cefTRIAXone (ROCEPHIN)  IV     dextrose 5 % and 0.45% NaCl 125 mL/hr at 01/21/21 1510   IMMUNE GLOBULIN 10% (HUMAN) IV - For Fluid Restriction Only     PRN Meds:.hydrALAZINE    Anti-infectives (From admission, onward)    Start     Dose/Rate Route Frequency Ordered Stop   01/21/21 2000  cefTRIAXone (ROCEPHIN) 2 g in sodium chloride 0.9 % 100 mL IVPB        2 g 200 mL/hr over 30 Minutes Intravenous Every 24 hours 01/21/21 1750     01/20/21 2000  cefTRIAXone (ROCEPHIN) 1 g in sodium chloride 0.9 % 100 mL IVPB  Status:  Discontinued        1 g 200 mL/hr over 30 Minutes Intravenous Every 24 hours 01/18/2021 2245 01/21/21 1750   01/27/2021 2100  azithromycin (ZITHROMAX) 500 mg in sodium chloride 0.9 % 250 mL IVPB        500 mg 250 mL/hr over 60 Minutes Intravenous Every 24 hours 01/17/2021 2056     01/25/2021 1900  cefTRIAXone (ROCEPHIN) 1 g in sodium chloride 0.9 % 100 mL IVPB        1 g 200 mL/hr over 30 Minutes Intravenous  Once 01/01/2021 1851 01/06/2021 1949         Objective:   Vitals:   01/21/21 1329 01/21/21 1406 01/21/21 1600 01/21/21 1613  BP: (!) 182/92  (!) 160/88   Pulse: 99  99   Resp: 20  (!) 32   Temp: 98.4 F (36.9 C)  98.2 F (36.8 C)   TempSrc: Axillary  Axillary   SpO2: 100% 99% 94% 94%  Weight:      Height:        Wt Readings from Last 3 Encounters:  01/20/21 66.2 kg  04/14/20 75.4 kg  12/23/19 73.4 kg     Intake/Output Summary (Last 24 hours) at 01/21/2021 1750 Last data  filed at 01/21/2021 0500 Gross per 24 hour  Intake 874.29 ml  Output --  Net 874.29 ml     Physical Exam  Gen:-Confused, disoriented in no acute distress HEENT:- [place].AT, No sclera icterus Nose- [place] 2L/min Neck-Supple Neck,No JVD,.  Lungs-respirations on the right, no wheezing  CV- S1, S2 normal, regular  Abd-  +ve B.Sounds, Abd Soft, No tenderness,    Extremity/Skin:- No  edema, pedal pulses present , no significant ecchymosis, no petechia no bleeding concerns on mucosal or skin Psych-affect is appropriate, oriented x3 Neuro-generalized weakness, no new  focal deficits, no tremors   Data Review:   Micro Results Recent Results (from the past 240 hour(s))  Blood culture (routine x 2)     Status: None (Preliminary result)   Collection Time: 12/30/2020  6:35 PM   Specimen: BLOOD RIGHT HAND  Result Value Ref Range Status   Specimen Description BLOOD RIGHT HAND  Final   Special Requests   Final    BOTTLES DRAWN AEROBIC AND ANAEROBIC Blood Culture adequate volume   Culture   Final    NO GROWTH 2 DAYS Performed at [facility], [address], [place], [place] [address]    Report Status PENDING  Incomplete  Blood culture (routine x 2)     Status: None (Preliminary result)   Collection Time: 01/25/2021  6:35 PM   Specimen: BLOOD LEFT HAND  Result Value Ref Range Status   Specimen Description BLOOD LEFT HAND  Final   Special Requests   Final    BOTTLES DRAWN AEROBIC AND ANAEROBIC Blood Culture adequate volume   Culture   Final    NO GROWTH 2 DAYS Performed at [facility], [address], [place], [place] [address]    Report Status PENDING  Incomplete  Resp Panel by RT-PCR (Flu A&B, Covid) Nasopharyngeal Swab     Status: None   Collection Time: 01/23/2021  6:37 PM   Specimen: Nasopharyngeal Swab; Nasopharyngeal(NP) swabs in vial transport medium  Result Value Ref Range Status   SARS Coronavirus 2 by RT PCR NEGATIVE NEGATIVE Final    Comment: (NOTE) SARS-CoV-2 target nucleic acids are  NOT DETECTED.  The SARS-CoV-2 RNA is generally detectable in upper respiratory specimens during the acute phase of infection. The lowest concentration of SARS-CoV-2 viral copies this assay can detect is 138 copies/mL. A negative result does not preclude SARS-Cov-2 infection and should not be used as the sole basis for treatment or other patient management decisions. A negative result may occur with  improper specimen collection/handling, submission of specimen other than nasopharyngeal swab, presence of viral mutation(s) within the areas targeted by this assay, and inadequate number of viral copies(<138 copies/mL). A negative result must be combined with clinical observations, patient history, and epidemiological information. The expected result is Negative.  Fact Sheet for Patients:  EntrepreneurPulse.com.au  Fact Sheet for Healthcare Providers:  IncredibleEmployment.be  This test is no t yet approved or cleared by the [place] FDA and  has been authorized for detection and/or diagnosis of SARS-CoV-2 by FDA under an Emergency Use Authorization (EUA). This EUA will remain  in effect (meaning this test can be used) for the duration of the COVID-19 declaration under Section 564(b)(1) of the Act, 21 U.S.C.section 360bbb-3(b)(1), unless the authorization is terminated  or revoked sooner.       Influenza A by PCR NEGATIVE NEGATIVE Final   Influenza B by PCR NEGATIVE NEGATIVE Final    Comment: (NOTE) The Xpert Xpress SARS-CoV-2/FLU/RSV plus assay is intended as an aid in the diagnosis of influenza from Nasopharyngeal swab specimens and should not be used as a sole basis for treatment. Nasal washings and aspirates are unacceptable for Xpert Xpress SARS-CoV-2/FLU/RSV testing.  Fact Sheet for Patients: EntrepreneurPulse.com.au  Fact Sheet for Healthcare Providers: IncredibleEmployment.be  This test is not  yet approved or cleared by the [place] FDA and has been authorized for detection and/or diagnosis of SARS-CoV-2 by FDA under an Emergency Use Authorization (EUA). This EUA will remain in effect (meaning this test can be used) for the duration of the  COVID-19 declaration under Section 564(b)(1) of the Act, 21 U.S.C. section 360bbb-3(b)(1), unless the authorization is terminated or revoked.  Performed at [facility], [address], [place], [place] [address]     Radiology Reports CT HEAD WO CONTRAST (5MM)  Result Date: 01/17/2021 CLINICAL DATA:  Mental status changes EXAM: CT HEAD WITHOUT CONTRAST TECHNIQUE: Contiguous axial images were obtained from the base of the skull through the vertex without intravenous contrast. COMPARISON:  11/27/2001 FINDINGS: Brain: There is atrophy and chronic small vessel disease changes. No acute intracranial abnormality. Specifically, no hemorrhage, hydrocephalus, mass lesion, acute infarction, or significant intracranial injury. Vascular: No hyperdense vessel or unexpected calcification. Skull: No acute calvarial abnormality. Sinuses/Orbits: No acute findings Other: None IMPRESSION: Atrophy, chronic microvascular disease. No acute intracranial abnormality. Electronically Signed   By: Rolm Baptise M.D.   On: 01/27/2021 18:58   MR BRAIN WO CONTRAST  Result Date: 01/21/2021 CLINICAL DATA:  Mental status change, unknown cause. EXAM: MRI HEAD WITHOUT CONTRAST TECHNIQUE: Multiplanar, multiecho pulse sequences of the brain and surrounding structures were obtained without intravenous contrast. COMPARISON:  Head CT January 19, 2021 FINDINGS: The study is partially degraded by motion. Brain: No acute infarction, hemorrhage, hydrocephalus, extra-axial collection or mass lesion. Scattered and confluent foci of T2 hyperintensity are seen within the white matter of the cerebral hemispheres nonspecific, most likely related to chronic small vessel ischemia. Moderate  parenchymal volume loss. Vascular: Normal flow voids. Skull and upper cervical spine: Negative. Sinuses/Orbits: Bilateral lens surgery. Paranasal sinuses are clear. Other: None. IMPRESSION: 1. Study partially limited by motion. 2. No acute intracranial abnormality. 3. Moderate chronic ischemic changes of the white matter and parenchymal volume. Electronically Signed   By: Pedro Earls M.D.   On: 01/21/2021 13:17   DG CHEST PORT 1 VIEW  Result Date: 01/21/2021 CLINICAL DATA:  Altered level of consciousness, short of breath EXAM: PORTABLE CHEST 1 VIEW COMPARISON:  01/21/2021 FINDINGS: Single frontal view of the chest demonstrates a stable cardiac silhouette. There is chronic background interstitial scarring, with superimposed right perihilar airspace disease that has progressed since prior study. No effusion or pneumothorax. No acute bony abnormalities. IMPRESSION: 1. Increasing right perihilar airspace disease, consistent with acute pneumonia superimposed upon chronic background lung scarring. Electronically Signed   By: Randa Ngo M.D.   On: 01/21/2021 17:34   DG Chest Port 1 View  Result Date: 01/04/2021 CLINICAL DATA:  Cough. EXAM: PORTABLE CHEST 1 VIEW COMPARISON:  Chest x-ray dated July 14, 2018. FINDINGS: Stable cardiomediastinal silhouette status post CABG. Patchy opacities in the right upper lobe. No pleural effusion or pneumothorax. No acute osseous abnormality. IMPRESSION: 1. Right upper lobe pneumonia. Electronically Signed   By: Titus Dubin M.D.   On: 01/13/2021 18:10     CBC Recent Labs  Lab 01/13/2021 1728 01/20/21 0445 01/21/21 0550  WBC 7.2 5.4 10.7*  HGB 13.8 11.5* 12.1*  HCT 40.7 35.3* 37.7*  PLT 30* 26* 30*  MCV 95.8 97.5 95.9  MCH 32.5 31.8 30.8  MCHC 33.9 32.6 32.1  RDW 14.0 13.7 13.4  LYMPHSABS 1.5  --  1.1  MONOABS 0.9  --  0.9  EOSABS 0.1  --  0.1  BASOSABS 0.1  --  0.0    Chemistries  Recent Labs  Lab 01/16/2021 1728 01/20/21 0008  01/20/21 0445 01/21/21 0550  NA 149* 149* 148* 147*  K 3.3* 3.8 3.4* 3.2*  CL 113* 115* 113* 110  CO2 '25 24 22 22  '$ GLUCOSE 112* 129*  134* 120*  BUN 34* 31* 30* 28*  CREATININE 1.09 0.93 0.88 0.89  CALCIUM 11.9* 10.7* 10.8* 10.8*  AST 42*  --  38 49*  ALT 30  --  29 38  ALKPHOS 166*  --  138* 140*  BILITOT 1.5*  --  1.2 1.0   ------------------------------------------------------------------------------------------------------------------ No results for input(s): CHOL, HDL, LDLCALC, TRIG, CHOLHDL, LDLDIRECT in the last 72 hours.  No results found for: HGBA1C ------------------------------------------------------------------------------------------------------------------ No results for input(s): TSH, T4TOTAL, T3FREE, THYROIDAB in the last 72 hours.  Invalid input(s): FREET3 ------------------------------------------------------------------------------------------------------------------ Recent Labs    01/20/21 1518  VITAMINB12 2,056*  FOLATE 6.3    Coagulation profile Recent Labs  Lab 01/25/2021 1728  INR 1.3*    No results for input(s): DDIMER in the last 72 hours.  Cardiac Enzymes No results for input(s): CKMB, TROPONINI, MYOGLOBIN in the last 168 hours.  Invalid input(s): CK ------------------------------------------------------------------------------------------------------------------ No results found for: BNP   Roxan Hockey M.D on 01/21/2021 at 5:50 PM  Go to [email or web address] - for contact info  [facility] - Office  [phone]

## 2021-01-21 NOTE — Progress Notes (Signed)
   01/21/21 1600  Vitals  Temp 98.2 F (36.8 C)  Temp Source Axillary  BP (!) 160/88  BP Location Right Arm  BP Method Manual  Patient Position (if appropriate) Lying  Pulse Rate 99  Pulse Rate Source Monitor  Resp (!) 32  MEWS COLOR  MEWS Score Color Yellow  Oxygen Therapy  SpO2 94 %  O2 Device Nasal Cannula  O2 Flow Rate (L/min) 3 L/min  Pain Assessment  Pain Scale 0-10  MEWS Score  MEWS Temp 0  MEWS Systolic 0  MEWS Pulse 0  MEWS RR 2  MEWS LOC 0  MEWS Score 2

## 2021-01-21 NOTE — Progress Notes (Signed)
EKG competed, PT given PRN breathing treatment due to high respirations and wheezing. Pt Yellow MEWS, charge nurse and MD notified.

## 2021-01-21 NOTE — Progress Notes (Signed)
Pt cleaned up by nurse and tech. Pt became agitated and restless. Pt HR was elevated in the 140s and respirations 30. MD paged to evaluate pt. MD ordered 548m bolus and step down orders placed.

## 2021-01-21 NOTE — Evaluation (Signed)
Clinical/Bedside Swallow Evaluation Patient Details  Name: Devin [name] MRN: [id number] Date of Birth: 06-17-1945  Today's Date: 01/21/2021 Time: SLP Start Time (ACUTE ONLY): 60 SLP Stop Time (ACUTE ONLY): 1429 SLP Time Calculation (min) (ACUTE ONLY): 26 min  Past Medical History:  Past Medical History:  Diagnosis Date   Arthritis    Asthma    Carpal tunnel syndrome    Chest pain, unspecified    Chronic kidney disease    hx of kidney stones   Coronary artery disease    Coronary atherosclerosis of artery bypass graft    GERD (gastroesophageal reflux disease)    History of kidney stones    HOH (hard of hearing)    Hypertension    Occlusion and stenosis of carotid artery without mention of cerebral infarction    Other and unspecified hyperlipidemia    Personal history of unspecified circulatory disease    Shortness of breath    Stroke ([place])    TIA (transient ischemic attack)    Past Surgical History:  Past Surgical History:  Procedure Laterality Date   ABDOMINAL AORTOGRAM N/A 06/14/2017   Procedure: ABDOMINAL AORTOGRAM;  Surgeon: Wellington Hampshire, MD;  Location: [place] [facility];  Service: Cardiovascular;  Laterality: N/A;   ABDOMINAL AORTOGRAM N/A 01/26/2018   Procedure: ABDOMINAL AORTOGRAM;  Surgeon: Elam Dutch, MD;  Location: [place] [facility];  Service: Cardiovascular;  Laterality: N/A;   ABDOMINAL AORTOGRAM N/A 07/11/2018   Procedure: ABDOMINAL AORTOGRAM;  Surgeon: Marty Heck, MD;  Location: [place] [facility];  Service: Cardiovascular;  Laterality: N/A;   BACK SURGERY     cervical disc   CARDIAC CATHETERIZATION     CARPAL TUNNEL RELEASE     blateral carpal tunnel   COLONOSCOPY WITH PROPOFOL N/A 02/18/2020   Procedure: COLONOSCOPY WITH PROPOFOL;  Surgeon: Eloise Harman, DO;  [facility];  Service: Endoscopy;  Laterality: N/A;  7:30   CORONARY ARTERY BYPASS GRAFT     x5 12 yrs ago   ENDARTERECTOMY FEMORAL Right 07/03/2017    Procedure: ENDARTERECTOMY FEMORAL RIGHT;  Surgeon: Elam Dutch, MD;  Location: [facility];  Service: Vascular;  Laterality: Right;   FEMORAL-POPLITEAL BYPASS GRAFT Right 07/03/2017   Procedure: BYPASS GRAFT FEMORAL-POPLITEAL ARTERY;  Surgeon: Elam Dutch, MD;  Location: [facility];  Service: Vascular;  Laterality: Right;   FEMORAL-POPLITEAL BYPASS GRAFT Left 02/12/2018   Procedure: BYPASS GRAFT FEMORAL-POPLITEAL ARTERY LEFT LEG;  Surgeon: Elam Dutch, MD;  Location: [place];  Service: Vascular;  Laterality: Left;   LOWER EXTREMITY ANGIOGRAM Bilateral 08/05/2015   Procedure: Lower Extremity Angiogram;  Surgeon: Wellington Hampshire, MD;  Location: [place] [facility];  Service: Cardiovascular;  Laterality: Bilateral;   LOWER EXTREMITY ANGIOGRAPHY Bilateral 06/14/2017   Procedure: Lower Extremity Angiography;  Surgeon: Wellington Hampshire, MD;  Location: [place] [facility];  Service: Cardiovascular;  Laterality: Bilateral;   LOWER EXTREMITY ANGIOGRAPHY Bilateral 01/26/2018   Procedure: LOWER EXTREMITY ANGIOGRAPHY;  Surgeon: Elam Dutch, MD;  Location: [place] [facility];  Service: Cardiovascular;  Laterality: Bilateral;   LOWER EXTREMITY ANGIOGRAPHY Right 07/11/2018   Procedure: LOWER EXTREMITY ANGIOGRAPHY;  Surgeon: Marty Heck, MD;  Location: [place] [facility];  Service: Cardiovascular;  Laterality: Right;  thrombolysis fem/tib bypass graft   NECK SURGERY     PATCH ANGIOPLASTY Right 07/03/2017   Procedure: VEIN PATCH ANGIOPLASTY OF FEMORAL AND POSTERIOR TIBIAL ARTERIES;  Surgeon: Elam Dutch, MD;  Location: [place];  Service: Vascular;  Laterality: Right;   PERIPHERAL VASCULAR BALLOON ANGIOPLASTY Right 07/12/2018   Procedure: PERIPHERAL VASCULAR BALLOON ANGIOPLASTY;  Surgeon: Marty Heck, MD;  Location: [place] [facility];  Service: Cardiovascular;  Laterality: Right;  Right fem posterior tibial graft and posterior tibial artery.   PERIPHERAL VASCULAR BALLOON ANGIOPLASTY Right  07/13/2018   Procedure: PERIPHERAL VASCULAR BALLOON ANGIOPLASTY;  Surgeon: Elam Dutch, MD;  Location: [place] [facility];  Service: Cardiovascular;  Laterality: Right;  PT   PERIPHERAL VASCULAR CATHETERIZATION N/A 08/05/2015   Procedure: Abdominal Aortogram;  Surgeon: Wellington Hampshire, MD;  Location: [place] [facility];  Service: Cardiovascular;  Laterality: N/A;   PERIPHERAL VASCULAR THROMBECTOMY N/A 07/12/2018   Procedure: PERIPHERAL VASCULAR THROMBECTOMY - LYSIS RECHECK;  Surgeon: Marty Heck, MD;  Location: [place] [facility];  Service: Cardiovascular;  Laterality: N/A;   PERIPHERAL VASCULAR THROMBECTOMY Right 07/13/2018   Procedure: PERIPHERAL VASCULAR THROMBECTOMY;  Surgeon: Elam Dutch, MD;  Location: [place] [facility];  Service: Cardiovascular;  Laterality: Right;   POLYPECTOMY  02/18/2020   Procedure: POLYPECTOMY;  Surgeon: Eloise Harman, DO;  [facility];  Service: Endoscopy;;   SHOULDER ARTHROSCOPY W/ ROTATOR CUFF REPAIR     Right   VEIN HARVEST Left 02/12/2018   Procedure: VEIN HARVEST;  Surgeon: Elam Dutch, MD;  Location: [place];  Service: Vascular;  Laterality: Left;   HPI:  Devin [name]  is a 75 y.o. male, with history of coronary artery disease, GERD, hypertension, stenosis of carotid artery, COPD, TIA, and more presents the ED with chief complaint of fall.  Unfortunately patient is not able to provide any history.  He opens his eyes to light touch, but then he talks incoherently. His wife apparently was here when he first arrived.  She told staff that he has been fatigued, generalized weakness for the past 2 days.  He went to sit on the couch and slid off last night.  She was not able to get him up, so he laid on the floor for 24 hours.  She did give him a pillow.  Today she called EMS to help get him up. Pt admitted on 12/28/2020 with community-acquired pneumonia and hypoxia. Chest xray shows: Right upper lobe pneumonia. BSE requested.    Assessment / Plan / Recommendation Clinical Impression  Clinical swallowing evaluation completed while Pt was sitting upright in bed; Pt was able to eventually tell SLP his name, but otherwise all speech was unintelligible and Pt overall was very confused. 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 Pt was also lethargic and needed cues to remain alert and responsive throughout evaluation. Pt only consumed limited trials, 1 ice chip, 2-3 sips of thin liquids and one bite of puree; He pleasantly refused further trials. No immediate overt s/sx of aspiration were observed, however note a delayed congested cough after trials was noted (may or may not have been related to trials). Pt is at higher risk for aspiration given his current mentation. Recommend NPO status with single ice chips and meds are ok crushed with puree and sips of water (if Pt requests). SLP will re-assess for PO readiness/appropriateness tomorrow am. Above to RN and MD who are in agreement with POC SLP Visit Diagnosis: Dysphagia, unspecified (R13.10)    Aspiration Risk  Moderate aspiration risk;Risk for inadequate nutrition/hydration    Diet Recommendation NPO   Liquid Administration via: Cup;Straw Medication Administration: Crushed with puree Supervision: Full supervision/cueing for  compensatory strategies Compensations: Minimize environmental distractions;Slow rate;Small sips/bites Postural Changes: Seated upright at 90 degrees    Other  Recommendations Oral Care Recommendations: Oral care BID   Follow up Recommendations        Frequency and Duration min 1 x/week  1 week       Prognosis Prognosis for Safe Diet Advancement: Fair      Swallow Study   General Date of Onset: 01/20/2021 HPI: Devin [name]  is a 75 y.o. male, with history of coronary artery disease, GERD, hypertension, stenosis of carotid artery, COPD, TIA, and more presents the ED with chief complaint of fall.  Unfortunately  patient is not able to provide any history.  He opens his eyes to light touch, but then he talks incoherently. His wife apparently was here when he first arrived.  She told staff that he has been fatigued, generalized weakness for the past 2 days.  He went to sit on the couch and slid off last night.  She was not able to get him up, so he laid on the floor for 24 hours.  She did give him a pillow.  Today she called EMS to help get him up. Pt admitted on 01/07/2021 with community-acquired pneumonia and hypoxia. Chest xray shows: Right upper lobe pneumonia. BSE requested. Type of Study: Bedside Swallow Evaluation Previous Swallow Assessment: none in chart Diet Prior to this Study: Regular;Thin liquids Temperature Spikes Noted: No Respiratory Status: Room air History of Recent Intubation: No Behavior/Cognition: Confused;Lethargic/Drowsy;Requires cueing;Doesn't follow directions Oral Cavity Assessment: Dry Oral Care Completed by SLP: Recent completion by staff Oral Cavity - Dentition: Edentulous Vision: Impaired for self-feeding Self-Feeding Abilities: Total assist Patient Positioning: Upright in bed Baseline Vocal Quality: Normal    Oral/Motor/Sensory Function Overall Oral Motor/Sensory Function: Within functional limits   Ice Chips Ice chips: Within functional limits   Thin Liquid Thin Liquid: Within functional limits    Nectar Thick Nectar Thick Liquid: Not tested   Honey Thick Honey Thick Liquid: Not tested   Puree Puree: Within functional limits   Solid     Solid: Not tested     Devin [name] H. Roddie Mc, [place] Speech Language Pathologist  Wende Bushy 01/21/2021,2:52 PM

## 2021-01-22 ENCOUNTER — Ambulatory Visit: Payer: Medicare Other | Admitting: Physician Assistant

## 2021-01-22 ENCOUNTER — Inpatient Hospital Stay (HOSPITAL_COMMUNITY): Payer: Medicare Other

## 2021-01-22 DIAGNOSIS — A419 Sepsis, unspecified organism: Secondary | ICD-10-CM | POA: Diagnosis not present

## 2021-01-22 DIAGNOSIS — R0609 Other forms of dyspnea: Secondary | ICD-10-CM | POA: Diagnosis not present

## 2021-01-22 DIAGNOSIS — R6520 Severe sepsis without septic shock: Secondary | ICD-10-CM | POA: Diagnosis not present

## 2021-01-22 DIAGNOSIS — I214 Non-ST elevation (NSTEMI) myocardial infarction: Secondary | ICD-10-CM | POA: Diagnosis present

## 2021-01-22 DIAGNOSIS — I5021 Acute systolic (congestive) heart failure: Secondary | ICD-10-CM | POA: Diagnosis present

## 2021-01-22 LAB — ECHOCARDIOGRAM COMPLETE
AR max vel: 1.99 cm2
AV Area VTI: 1.8 cm2
AV Area mean vel: 1.72 cm2
AV Mean grad: 2 mmHg
AV Peak grad: 4 mmHg
Ao pk vel: 1 m/s
Area-P 1/2: 4.21 cm2
Calc EF: 28.3 %
Height: 69 in
MV VTI: 1.84 cm2
S' Lateral: 4.45 cm
Single Plane A2C EF: 29.1 %
Single Plane A4C EF: 30.1 %
Weight: 2366.86 oz

## 2021-01-22 LAB — URINALYSIS, COMPLETE (UACMP) WITH MICROSCOPIC
Bacteria, UA: NONE SEEN
Bilirubin Urine: NEGATIVE
Glucose, UA: NEGATIVE mg/dL
Ketones, ur: NEGATIVE mg/dL
Leukocytes,Ua: NEGATIVE
Nitrite: NEGATIVE
Protein, ur: NEGATIVE mg/dL
Specific Gravity, Urine: 1.04 — ABNORMAL HIGH (ref 1.005–1.030)
pH: 5 (ref 5.0–8.0)

## 2021-01-22 LAB — COPPER, SERUM: Copper: 30 ug/dL — ABNORMAL LOW (ref 69–132)

## 2021-01-22 LAB — VITAMIN D 25 HYDROXY (VIT D DEFICIENCY, FRACTURES): Vit D, 25-Hydroxy: 51.76 ng/mL (ref 30–100)

## 2021-01-22 LAB — RENAL FUNCTION PANEL
Albumin: 2.6 g/dL — ABNORMAL LOW (ref 3.5–5.0)
Anion gap: 4 — ABNORMAL LOW (ref 5–15)
BUN: 27 mg/dL — ABNORMAL HIGH (ref 8–23)
CO2: 25 mmol/L (ref 22–32)
Calcium: 9.3 mg/dL (ref 8.9–10.3)
Chloride: 109 mmol/L (ref 98–111)
Creatinine, Ser: 0.93 mg/dL (ref 0.61–1.24)
GFR, Estimated: >60 mL/min (ref >60)
Glucose, Bld: 143 mg/dL — ABNORMAL HIGH (ref 70–99)
Phosphorus: 3.6 mg/dL (ref 2.5–4.6)
Potassium: 3.5 mmol/L (ref 3.5–5.1)
Sodium: 138 mmol/L (ref 135–145)

## 2021-01-22 LAB — CBC
HCT: 34.2 % — ABNORMAL LOW (ref 39.0–52.0)
Hemoglobin: 11.3 g/dL — ABNORMAL LOW (ref 13.0–17.0)
MCH: 31.7 pg (ref 26.0–34.0)
MCHC: 33 g/dL (ref 30.0–36.0)
MCV: 95.8 fL (ref 80.0–100.0)
Platelets: 29 10*3/uL — CL (ref 150–400)
RBC: 3.57 MIL/uL — ABNORMAL LOW (ref 4.22–5.81)
RDW: 13.7 % (ref 11.5–15.5)
WBC: 9 10*3/uL (ref 4.0–10.5)
nRBC: 0.7 % — ABNORMAL HIGH (ref 0.0–0.2)

## 2021-01-22 LAB — TROPONIN I (HIGH SENSITIVITY)
Troponin I (High Sensitivity): 740 ng/L (ref <18)
Troponin I (High Sensitivity): 725 ng/L (ref <18)

## 2021-01-22 LAB — HEPATIC FUNCTION PANEL
ALT: 35 U/L (ref 0–44)
AST: 48 U/L — ABNORMAL HIGH (ref 15–41)
Albumin: 2.7 g/dL — ABNORMAL LOW (ref 3.5–5.0)
Alkaline Phosphatase: 112 U/L (ref 38–126)
Bilirubin, Direct: 0.1 mg/dL (ref 0.0–0.2)
Indirect Bilirubin: 0.4 mg/dL (ref 0.3–0.9)
Total Bilirubin: 0.5 mg/dL (ref 0.3–1.2)
Total Protein: 7.5 g/dL (ref 6.5–8.1)

## 2021-01-22 LAB — RETICULOCYTES
Immature Retic Fract: 19.3 % — ABNORMAL HIGH (ref 2.3–15.9)
RBC.: 3.54 MIL/uL — ABNORMAL LOW (ref 4.22–5.81)
Retic Count, Absolute: 55.6 10*3/uL (ref 19.0–186.0)
Retic Ct Pct: 1.6 % (ref 0.4–3.1)

## 2021-01-22 LAB — MAGNESIUM: Magnesium: 1.7 mg/dL (ref 1.7–2.4)

## 2021-01-22 LAB — LACTATE DEHYDROGENASE: LDH: 956 U/L — ABNORMAL HIGH (ref 98–192)

## 2021-01-22 LAB — METHYLMALONIC ACID, SERUM: Methylmalonic Acid, Quantitative: 112 nmol/L (ref 0–378)

## 2021-01-22 LAB — TSH: TSH: 1.416 u[IU]/mL (ref 0.350–4.500)

## 2021-01-22 LAB — MRSA NEXT GEN BY PCR, NASAL: MRSA by PCR Next Gen: NOT DETECTED

## 2021-01-22 LAB — STREP PNEUMONIAE URINARY ANTIGEN: Strep Pneumo Urinary Antigen: NEGATIVE

## 2021-01-22 MED ORDER — LEVALBUTEROL HCL 1.25 MG/0.5ML IN NEBU
1.2500 mg | INHALATION_SOLUTION | Freq: Four times a day (QID) | RESPIRATORY_TRACT | Status: DC
  Administered 2021-01-22 – 2021-01-25 (×14): 1.25 mg via RESPIRATORY_TRACT
  Filled 2021-01-22 (×14): qty 0.5

## 2021-01-22 MED ORDER — LORAZEPAM 2 MG/ML IJ SOLN
0.5000 mg | Freq: Three times a day (TID) | INTRAMUSCULAR | Status: DC | PRN
  Administered 2021-01-22 – 2021-01-24 (×3): 0.5 mg via INTRAVENOUS
  Filled 2021-01-22 (×3): qty 1

## 2021-01-22 MED ORDER — IPRATROPIUM BROMIDE 0.02 % IN SOLN
0.5000 mg | Freq: Four times a day (QID) | RESPIRATORY_TRACT | Status: DC
  Administered 2021-01-22 – 2021-01-25 (×14): 0.5 mg via RESPIRATORY_TRACT
  Filled 2021-01-22 (×14): qty 2.5

## 2021-01-22 MED ORDER — METOPROLOL TARTRATE 5 MG/5ML IV SOLN
5.0000 mg | Freq: Four times a day (QID) | INTRAVENOUS | Status: DC
  Administered 2021-01-22 – 2021-01-26 (×16): 5 mg via INTRAVENOUS
  Filled 2021-01-22 (×16): qty 5

## 2021-01-22 MED ORDER — IPRATROPIUM BROMIDE 0.02 % IN SOLN
RESPIRATORY_TRACT | Status: AC
  Administered 2021-01-22: 0.5 mg
  Filled 2021-01-22: qty 2.5

## 2021-01-22 MED ORDER — METOPROLOL TARTRATE 5 MG/5ML IV SOLN
INTRAVENOUS | Status: AC
  Filled 2021-01-22: qty 5

## 2021-01-22 MED ORDER — METOPROLOL TARTRATE 5 MG/5ML IV SOLN
5.0000 mg | Freq: Once | INTRAVENOUS | Status: AC
  Administered 2021-01-22: 5 mg via INTRAVENOUS

## 2021-01-22 MED ORDER — DEXMEDETOMIDINE HCL IN NACL 400 MCG/100ML IV SOLN: 0.4000 ug/kg/h | INTRAVENOUS | Status: DC

## 2021-01-22 MED ORDER — LORAZEPAM 2 MG/ML IJ SOLN
INTRAMUSCULAR | Status: AC
  Filled 2021-01-22: qty 1

## 2021-01-22 MED ORDER — LORAZEPAM 2 MG/ML IJ SOLN
1.0000 mg | Freq: Once | INTRAMUSCULAR | Status: AC
  Administered 2021-01-22: 1 mg via INTRAVENOUS

## 2021-01-22 MED ORDER — LEVALBUTEROL HCL 1.25 MG/0.5ML IN NEBU
INHALATION_SOLUTION | RESPIRATORY_TRACT | Status: AC
  Administered 2021-01-22: 1.25 mg
  Filled 2021-01-22: qty 0.5

## 2021-01-22 MED ORDER — METOPROLOL TARTRATE 25 MG PO TABS
25.0000 mg | ORAL_TABLET | Freq: Two times a day (BID) | ORAL | Status: DC
  Filled 2021-01-22: qty 1

## 2021-01-22 NOTE — Progress Notes (Signed)
PT Cancellation Note  Patient Details Name: Devin [name] MRN: [id number] DOB: Apr 06, 1946   Cancelled Treatment:    Reason Eval/Treat Not Completed: Medical issues which prohibited therapy.  Patient transferred to a higher level of care and will need new PT consult resume therapy when patient is medically stable.  Thank you.    7:41 AM, 01/22/21 Lonell Grandchild, MPT Physical Therapist with [facility] [phone] office [phone] mobile phone

## 2021-01-22 NOTE — Progress Notes (Signed)
  Speech Language Pathology Treatment: Dysphagia  Patient Details Name: Devin [name] MRN: [id number] DOB: [birth year] Today's Date: 01/22/2021 Time: [id number] SLP Time Calculation (min) (ACUTE ONLY): 19 min  Assessment / Plan / Recommendation Clinical Impression  Ongoing dysphagia treatment provided this am with very limited trials of thin liquids (one ice chip and 2 tsp sips of water). Pt did state his name but the remainder of his speech to SLP was non-sensical. MD and RN were present at bedside for trials; Pt only accepted one ice chip and limited tsp sips of thin liquids before turning his head and refusing further trials; he also demonstrated a delayed and congested cough after limited trials. After PO trials yesterday per chart review Pt began presenting with high respirations and wheezing and was eventually placed on Bi-PAP and moved to step-down, his respiratory status was stable prior to this diet check this am but he remains on 7 L/mn. Again note dysphagia seems to be primarily cognitive in nature. However, Secondary to compromised respiratory status, limited trials, Pt refusal/lack of desire for PO and fluctuating mentation recommend continue NPO; single ice chips are ok after oral care, if Pt desires and meds are ok crushed in puree if needed (recommend via alternative means if possible). ST will continue efforts,   HPI HPI: Devin [name]  is a 75 y.o. male, with history of coronary artery disease, GERD, hypertension, stenosis of carotid artery, COPD, TIA, and more presents the ED with chief complaint of fall.  Unfortunately patient is not able to provide any history.  He opens his eyes to light touch, but then he talks incoherently. His wife apparently was here when he first arrived.  She told staff that he has been fatigued, generalized weakness for the past 2 days.  He went to sit on the couch and slid off last night.  She was not able to get him up, so he laid on the floor for 24  hours.  She did give him a pillow.  Today she called EMS to help get him up. Pt admitted on 01/15/2021 with community-acquired pneumonia and hypoxia. Chest xray shows: Right upper lobe pneumonia. BSE requested.      SLP Plan  Continue with current plan of care       Recommendations  Diet recommendations: NPO Liquids provided via: Teaspoon Medication Administration: Crushed with puree                Oral Care Recommendations: Oral care QID;Oral care prior to ice chip/H20 SLP Visit Diagnosis: Dysphagia, unspecified (R13.10) Plan: Continue with current plan of care       Stanley Lyness H. Roddie Mc, CCC-SLP Speech Language Pathologist    Wende Bushy 01/22/2021, 9:23 AM

## 2021-01-22 NOTE — Plan of Care (Signed)
  Problem: Coping: Goal: Level of anxiety will decrease 01/22/2021 1736 by Cristino Martes, RN Outcome: Progressing 01/22/2021 1734 by Cristino Martes, RN Outcome: Not Progressing 01/22/2021 1734 by Cristino Martes, RN Outcome: Progressing   Problem: Safety: Goal: Ability to remain free from injury will improve 01/22/2021 1736 by Cristino Martes, RN Outcome: Progressing 01/22/2021 1734 by Cristino Martes, RN Outcome: Not Progressing 01/22/2021 1734 by Cristino Martes, RN Outcome: Progressing

## 2021-01-22 NOTE — Plan of Care (Signed)
  Problem: Clinical Measurements: Goal: Will remain free from infection Outcome: Progressing Goal: Diagnostic test results will improve Outcome: Progressing Goal: Cardiovascular complication will be avoided Outcome: Progressing   Problem: Elimination: Goal: Will not experience complications related to bowel motility Outcome: Progressing Goal: Will not experience complications related to urinary retention Outcome: Progressing   Problem: Education: Goal: Knowledge of General Education information will improve Description: Including pain rating scale, medication(s)/side effects and non-pharmacologic comfort measures Outcome: Not Progressing   Problem: Health Behavior/Discharge Planning: Goal: Ability to manage health-related needs will improve Outcome: Not Progressing   Problem: Clinical Measurements: Goal: Ability to maintain clinical measurements within normal limits will improve Outcome: Not Progressing Goal: Respiratory complications will improve Outcome: Not Progressing   Problem: Activity: Goal: Risk for activity intolerance will decrease Outcome: Not Progressing   Problem: Nutrition: Goal: Adequate nutrition will be maintained Outcome: Not Progressing   Problem: Coping: Goal: Level of anxiety will decrease Outcome: Not Progressing

## 2021-01-22 NOTE — Progress Notes (Signed)
Per Dr. Joesph Fillers, patient's spouse is allowed to spend the night while he is in the ICU. Topic to be addressed if he moves out of the ICU. House supervisor made aware, will continue to monitor.

## 2021-01-22 NOTE — Progress Notes (Signed)
Patient Demographics:    Devin [name], is a 75 y.o. male, DOB - Aug 06, 1945, [id number]  Admit date - 01/24/2021   Admitting Physician Brok Stocking Denton Brick, MD  Outpatient Primary MD for the patient is [name], MD  LOS - 3   Chief Complaint  Patient presents with   Altered Mental Status        Subjective:    Devin [name] today has no fevers, no emesis,  No chest pain,   Wife and granddaughter at bedside -Episodes of confusion and disorientation persist-- -Dyspnea and hypoxia persist  Assessment  & Plan :    Principal Problem:   Severe sepsis with acute organ dysfunction due to Rt Sided PNA Active Problems:   CAP (community acquired pneumonia)   Acute HFrEF (heart failure with reduced ejection fraction) --EF 20 %   NSTEMI (non-ST elevated myocardial infarction) Type 2 Due to Sepsis   PAD (peripheral artery disease) (HCC)   Hyperlipidemia   Hypertension   Hypernatremia   Hypercalcemia   Dehydration   Hypokalemia   Acute metabolic encephalopathy   Thrombocytopenia (HCC)  Brief Summary:-  75 y.o. male, with history of coronary artery disease, GERD, hypertension, stenosis of carotid artery, COPD, TIA admitted on 01/06/2021 with severe sepsis secondary to community-acquired pneumonia and hypoxia and persistent thrombocytopenia  A/p  1) severe sepsis with acute hypoxic respiratory failure i secondary to community-acquired pneumonia (RUL)-- Patient meets sepsis criteria on admission with tachycardia and tachypnea and finding of right upper lobe pneumonia -01/22/21 Worsening respiratory status currently alternating between BiPAP with FiO2 of 40 to 50% and nasal cannula at 7 to 8 L/min -HIV negative -WBC normalized (patient is on steroids) -Chest x-ray from 01/21/2021 and repeat on 01/22/21  showed worsening right-sided pneumonia -CT abdomen and pelvis with right-sided pneumonia otherwise  no acute intra-abdominal findings -Continue bronchodilators and mucolytics -Strep pneumo antigen negative, Legionella pending Discussed with pulmonary critical care attending Dr. Halford Chessman recommends to continue Rocephin with azithromycin  2) severe thrombocytopenia--- discussed with Dr. Delton Coombes -Patient with longstanding history of thrombocytopenia platelets usually around 100 K -Platelets 30 >> 26 >>30>>29 k -Pathology smear reviewed  -LDH elevated at 666>>956 -Serum copper is low at 30 -B12 is not low, -Folate is normal -PTT is 34, INR is 1.3, Fibrinogen is low normal L at 219 -We will transfuse if platelet count less than 10 K or concerns for bleeding -Stop Pletal -Per Dr. Delton Coombes okay to give IVIG for possible sepsis induced thrombocytopenia Versus ITP -Continue steroids  3) chronic anemia-with normal MCV MCH and RDW -Patient hemoglobin usually between 10 and 11, on admission hemoglobin was 13.8 most likely due to dehydration, post hydration hgb is now close to baseline at 11.5 -No obvious bleeding noted  4) HFrEF/NSTEMI--- suspect type II NSTEMI due to demand ischemia in the setting of sepsis and pneumonia with acute respiratory failure and hypoxia -No chest pain Troponin--- 41>>535>>786>>740>>725 -Discussed with Dr Harl Bowie Pt is -Not a candidate for IV heparin or antiplatelet agent and Not a candidate for LHC due to severe , persistent thrombocytopenia -Continue IV metoprolol -Be judicious with IV fluids -  5) acute metabolic encephalopathy---secondary to #1 above, and #8 below -CT head negative for acute findings -MRI brain without acute finding -Suspect related  to infection and HF  6)HTN--stable continue PTA meds  7)PAD--s/p bilateral femoropopliteal bypass, with chronic occlusion of right graft  -Pletal discontinued due to severe thrombocytopenia  8) hypernatremia and hypercalcemia/hypokalemia--may be dehydration related, recheck after further hydration Na 149  >>147>>138 Be judicious with IV fluid due to heart failure  9) acute COPD exacerbation--- secondary to #1 above, manage as above #1  10)Social/ethics--- discussed with patient's wife and grand-daughter- she request DNR/DNI status however request full scope of treatment without limitations  11)Disposition/Need for in-Hospital Stay- patient unable to be discharged at this time due to severe sepsis secondary to pneumonia requiring IV antibiotics and NSTEMI with heart failure requiring further support with BiPAP Status is: Inpatient  Remains inpatient appropriate because: Please see disposition above  Disposition: The patient is from: Home              Anticipated d/c is to: SNF              Anticipated d/c date is: > 3 days              Patient currently is not medically stable to d/c. Barriers: Not Clinically Stable-   Code Status :  -  Code Status: DNR   Family Communication:    (patient is alert, awake and coherent)  Discussed with wife at [phone]----and in Person at bedside Discussed with granddaughter Devin [name]  Consults  : Dr. Delton Coombes  DVT Prophylaxis  :   - SCDs *  SCDs Start: 01/05/2021 2246    Lab Results  Component Value Date   PLT 29 (LL) 01/22/2021    Inpatient Medications  Scheduled Meds:  amLODipine  10 mg Oral Daily   atorvastatin  20 mg Oral Daily   Chlorhexidine Gluconate Cloth  6 each Topical Daily   ferrous sulfate  325 mg Oral Q breakfast   ipratropium  0.5 mg Nebulization Q6H   levalbuterol  1.25 mg Nebulization Q6H   methylPREDNISolone (SOLU-MEDROL) injection  40 mg Intravenous Q12H   metoprolol tartrate  5 mg Intravenous Q6H   mometasone-formoterol  2 puff Inhalation BID   vitamin B-12  1,000 mcg Oral Daily   Continuous Infusions:  azithromycin 500 mg (01/21/21 2125)   cefTRIAXone (ROCEPHIN)  IV 2 g (01/21/21 2245)   dextrose 5 % and 0.45% NaCl 50 mL/hr at 01/22/21 1625   IMMUNE GLOBULIN 10% (HUMAN) IV - For Fluid Restriction Only 161 mL/hr  at 01/21/21 2245   PRN Meds:.hydrALAZINE    Anti-infectives (From admission, onward)    Start     Dose/Rate Route Frequency Ordered Stop   01/21/21 2000  cefTRIAXone (ROCEPHIN) 2 g in sodium chloride 0.9 % 100 mL IVPB        2 g 200 mL/hr over 30 Minutes Intravenous Every 24 hours 01/21/21 1750     01/20/21 2000  cefTRIAXone (ROCEPHIN) 1 g in sodium chloride 0.9 % 100 mL IVPB  Status:  Discontinued        1 g 200 mL/hr over 30 Minutes Intravenous Every 24 hours 01/27/2021 2245 01/21/21 1750   01/24/2021 2100  azithromycin (ZITHROMAX) 500 mg in sodium chloride 0.9 % 250 mL IVPB        500 mg 250 mL/hr over 60 Minutes Intravenous Every 24 hours 12/31/2020 2056     01/13/2021 1900  cefTRIAXone (ROCEPHIN) 1 g in sodium chloride 0.9 % 100 mL IVPB        1 g 200 mL/hr over 30 Minutes Intravenous  Once 01/22/2021  1851 01/17/2021 1949         Objective:   Vitals:   01/22/21 1559 01/22/21 1600 01/22/21 1700 01/22/21 1720  BP:  (!) 163/96 135/79   Pulse:  (!) 118 99 94  Resp:  (!) 29 (!) 25 (!) 29  Temp: 97.6 F (36.4 C)     TempSrc: Axillary     SpO2:  93% 96% 97%  Weight:      Height:        Wt Readings from Last 3 Encounters:  01/21/21 67.1 kg  04/14/20 75.4 kg  12/23/19 73.4 kg     Intake/Output Summary (Last 24 hours) at 01/22/2021 1758 Last data filed at 01/22/2021 1509 Gross per 24 hour  Intake 3532.7 ml  Output 200 ml  Net 3332.7 ml     Physical Exam  Gen:-Confused, disoriented  HEENT:- [place].AT, No sclera icterus Nose- [place] 7L/min alternating with BiPAP Neck-Supple Neck,No JVD,.  Lungs-diminished air movement on the right, no wheezing  CV- S1, S2 normal, regular , tachycardic Abd-  +ve B.Sounds, Abd Soft, No tenderness,    Extremity/Skin:- No  edema, pedal pulses present , no significant ecchymosis, no petechia no bleeding concerns on mucosal or skin Psych-affect is anxious, restless confused Neuro-generalized weakness, no new focal deficits, no tremors   Data Review:    Micro Results Recent Results (from the past 240 hour(s))  Blood culture (routine x 2)     Status: None (Preliminary result)   Collection Time: 01/27/2021  6:35 PM   Specimen: BLOOD RIGHT HAND  Result Value Ref Range Status   Specimen Description BLOOD RIGHT HAND  Final   Special Requests   Final    BOTTLES DRAWN AEROBIC AND ANAEROBIC Blood Culture adequate volume   Culture   Final    NO GROWTH 3 DAYS Performed at [facility], [address], [place], [place] [address]    Report Status PENDING  Incomplete  Blood culture (routine x 2)     Status: None (Preliminary result)   Collection Time: 01/09/2021  6:35 PM   Specimen: BLOOD LEFT HAND  Result Value Ref Range Status   Specimen Description BLOOD LEFT HAND  Final   Special Requests   Final    BOTTLES DRAWN AEROBIC AND ANAEROBIC Blood Culture adequate volume   Culture   Final    NO GROWTH 3 DAYS Performed at [facility], [address], [place], [place] [address]    Report Status PENDING  Incomplete  Resp Panel by RT-PCR (Flu A&B, Covid) Nasopharyngeal Swab     Status: None   Collection Time: 01/06/2021  6:37 PM   Specimen: Nasopharyngeal Swab; Nasopharyngeal(NP) swabs in vial transport medium  Result Value Ref Range Status   SARS Coronavirus 2 by RT PCR NEGATIVE NEGATIVE Final    Comment: (NOTE) SARS-CoV-2 target nucleic acids are NOT DETECTED.  The SARS-CoV-2 RNA is generally detectable in upper respiratory specimens during the acute phase of infection. The lowest concentration of SARS-CoV-2 viral copies this assay can detect is 138 copies/mL. A negative result does not preclude SARS-Cov-2 infection and should not be used as the sole basis for treatment or other patient management decisions. A negative result may occur with  improper specimen collection/handling, submission of specimen other than nasopharyngeal swab, presence of viral mutation(s) within the areas targeted by this assay, and inadequate number of  viral copies(<138 copies/mL). A negative result must be combined with clinical observations, patient history, and epidemiological information. The expected result is Negative.  Fact Sheet for Patients:  EntrepreneurPulse.com.au  Fact Sheet for Healthcare Providers:  IncredibleEmployment.be  This test is no t yet approved or cleared by the [place] FDA and  has been authorized for detection and/or diagnosis of SARS-CoV-2 by FDA under an Emergency Use Authorization (EUA). This EUA will remain  in effect (meaning this test can be used) for the duration of the COVID-19 declaration under Section 564(b)(1) of the Act, 21 U.S.C.section 360bbb-3(b)(1), unless the authorization is terminated  or revoked sooner.       Influenza A by PCR NEGATIVE NEGATIVE Final   Influenza B by PCR NEGATIVE NEGATIVE Final    Comment: (NOTE) The Xpert Xpress SARS-CoV-2/FLU/RSV plus assay is intended as an aid in the diagnosis of influenza from Nasopharyngeal swab specimens and should not be used as a sole basis for treatment. Nasal washings and aspirates are unacceptable for Xpert Xpress SARS-CoV-2/FLU/RSV testing.  Fact Sheet for Patients: EntrepreneurPulse.com.au  Fact Sheet for Healthcare Providers: IncredibleEmployment.be  This test is not yet approved or cleared by the [place] FDA and has been authorized for detection and/or diagnosis of SARS-CoV-2 by FDA under an Emergency Use Authorization (EUA). This EUA will remain in effect (meaning this test can be used) for the duration of the COVID-19 declaration under Section 564(b)(1) of the Act, 21 U.S.C. section 360bbb-3(b)(1), unless the authorization is terminated or revoked.  Performed at [facility], [address], [place], [place] [address]   MRSA Next Gen by PCR, Nasal     Status: None   Collection Time: 01/21/21  9:13 PM   Specimen: Nasal Mucosa; Nasal Swab   Result Value Ref Range Status   MRSA by PCR Next Gen NOT DETECTED NOT DETECTED Final    Comment: (NOTE) The GeneXpert MRSA Assay (FDA approved for NASAL specimens only), is one component of a comprehensive MRSA colonization surveillance program. It is not intended to diagnose MRSA infection nor to guide or monitor treatment for MRSA infections. Test performance is not FDA approved in patients less than 32 years old. Performed at [facility], [address] [place], [place] [address]     Radiology Reports CT HEAD WO CONTRAST (5MM)  Result Date: 01/13/2021 CLINICAL DATA:  Mental status changes EXAM: CT HEAD WITHOUT CONTRAST TECHNIQUE: Contiguous axial images were obtained from the base of the skull through the vertex without intravenous contrast. COMPARISON:  11/27/2001 FINDINGS: Brain: There is atrophy and chronic small vessel disease changes. No acute intracranial abnormality. Specifically, no hemorrhage, hydrocephalus, mass lesion, acute infarction, or significant intracranial injury. Vascular: No hyperdense vessel or unexpected calcification. Skull: No acute calvarial abnormality. Sinuses/Orbits: No acute findings Other: None IMPRESSION: Atrophy, chronic microvascular disease. No acute intracranial abnormality. Electronically Signed   By: Rolm Baptise M.D.   On: 01/14/2021 18:58   MR BRAIN WO CONTRAST  Result Date: 01/21/2021 CLINICAL DATA:  Mental status change, unknown cause. EXAM: MRI HEAD WITHOUT CONTRAST TECHNIQUE: Multiplanar, multiecho pulse sequences of the brain and surrounding structures were obtained without intravenous contrast. COMPARISON:  Head CT January 19, 2021 FINDINGS: The study is partially degraded by motion. Brain: No acute infarction, hemorrhage, hydrocephalus, extra-axial collection or mass lesion. Scattered and confluent foci of T2 hyperintensity are seen within the white matter of the cerebral hemispheres nonspecific, most likely related to chronic small vessel  ischemia. Moderate parenchymal volume loss. Vascular: Normal flow voids. Skull and upper cervical spine: Negative. Sinuses/Orbits: Bilateral lens surgery. Paranasal sinuses are clear. Other: None. IMPRESSION: 1. Study partially limited by motion.  2. No acute intracranial abnormality. 3. Moderate chronic ischemic changes of the white matter and parenchymal volume. Electronically Signed   By: Pedro Earls M.D.   On: 01/21/2021 13:17   CT ABDOMEN PELVIS W CONTRAST  Result Date: 01/21/2021 CLINICAL DATA:  Acute abdominal pain, nonlocalized. Generalized abdominal pain, weakness, anemia, sepsis secondary to community-acquired pneumonia. EXAM: CT ABDOMEN AND PELVIS WITH CONTRAST TECHNIQUE: Multidetector CT imaging of the abdomen and pelvis was performed using the standard protocol following bolus administration of intravenous contrast. CONTRAST:  26m OMNIPAQUE IOHEXOL 350 MG/ML SOLN COMPARISON:  Chest radiograph 01/21/2021 FINDINGS: Lower chest: Airspace consolidation in the right lower lung consistent with pneumonia. Cardiac enlargement. Postoperative changes in the mediastinum. Hepatobiliary: Mild diffuse fatty infiltration of the liver. Poorly defined focal low-attenuation lesion in segment 7 of the liver measuring 1.2 cm diameter. There appears to be vague peripheral enhancement. While this may represent a hemangioma, the CT appearance is indeterminate. Consider further characterization with MRI in the nonemergent setting as clinically indicated. Cholelithiasis with small stone in the dependent gallbladder. No inflammatory changes. Bile ducts are not dilated. Pancreas: Unremarkable. No pancreatic ductal dilatation or surrounding inflammatory changes. Spleen: Normal in size without focal abnormality. Adrenals/Urinary Tract: Adrenal glands are unremarkable. Kidneys are normal, without renal calculi, focal lesion, or hydronephrosis. Bladder is unremarkable. Stomach/Bowel: Stomach, small bowel, and  colon are not abnormally distended. Stool throughout the colon. A patent medication also demonstrated in the colon. No wall thickening or inflammatory changes appreciated. Appendix is not identified. Vascular/Lymphatic: Diffuse prominent calcification of the aorta and branch vessels. No aneurysm. Acute change in diameter of the distal common femoral artery on the right with absence of flow distally suggesting thrombosis or occlusion. Suggestion of a femoral bypass graft which is also occluded. There is no retroperitoneal lymphadenopathy. There is evidence of lymphadenopathy in the porta hepatis, with enlarged lymph nodes in the hepatic hilum measuring 1.8 cm short axis dimension. Reproductive: Prostate is unremarkable. Other: No free air or free fluid in the abdomen. Abdominal wall musculature appears intact. Musculoskeletal: Degenerative changes in the spine. Old right rib fractures. IMPRESSION: 1. Airspace consolidation in the right lower lung consistent with pneumonia. 2. Indeterminate lesion in segment 7 of the liver. Consider further characterization with nonemergent MRI if clinically indicated. 3. Lymphadenopathy in the hepatic hilum with multiple lymph nodes measuring up to 1.8 cm short axis dimension. Nonspecific etiology. Malignant/metastatic versus reactive. 4. Absence of flow demonstrated in the distal common femoral artery extending into the superficial femoral artery on the right. Possible thrombosed bypass graft, incompletely visualized. Electronically Signed   By: WLucienne CapersM.D.   On: 01/21/2021 21:14   DG CHEST PORT 1 VIEW  Result Date: 01/22/2021 CLINICAL DATA:  Pneumonia EXAM: PORTABLE CHEST 1 VIEW COMPARISON:  01/21/2021 FINDINGS: No significant change in AP portable chest radiograph with diffuse heterogeneous and interstitial airspace opacity, somewhat coarse appearing, and cardiomegaly status post median sternotomy. IMPRESSION: 1. No significant change in AP portable chest radiograph  with diffuse heterogeneous and interstitial airspace opacity, somewhat coarse appearing, this may reflect a combination of acute airspace disease and chronic underlying interstitial change. This could be further assessed by CT if desired. 2.  Cardiomegaly status post median sternotomy. Electronically Signed   By: AEddie CandleM.D.   On: 01/22/2021 07:59   DG CHEST PORT 1 VIEW  Result Date: 01/21/2021 CLINICAL DATA:  Altered level of consciousness, short of breath EXAM: PORTABLE CHEST 1 VIEW COMPARISON:  01/25/2021 FINDINGS: Single frontal  view of the chest demonstrates a stable cardiac silhouette. There is chronic background interstitial scarring, with superimposed right perihilar airspace disease that has progressed since prior study. No effusion or pneumothorax. No acute bony abnormalities. IMPRESSION: 1. Increasing right perihilar airspace disease, consistent with acute pneumonia superimposed upon chronic background lung scarring. Electronically Signed   By: Randa Ngo M.D.   On: 01/21/2021 17:34   DG Chest Port 1 View  Result Date: 01/18/2021 CLINICAL DATA:  Cough. EXAM: PORTABLE CHEST 1 VIEW COMPARISON:  Chest x-ray dated July 14, 2018. FINDINGS: Stable cardiomediastinal silhouette status post CABG. Patchy opacities in the right upper lobe. No pleural effusion or pneumothorax. No acute osseous abnormality. IMPRESSION: 1. Right upper lobe pneumonia. Electronically Signed   By: Titus Dubin M.D.   On: 01/18/2021 18:10   ECHOCARDIOGRAM COMPLETE  Result Date: 01/22/2021    ECHOCARDIOGRAM REPORT   Patient Name:   MANVEL MORONTA Date of Exam: 01/22/2021 Medical Rec #:  [id number]          Height:       69.0 in Accession #:    [id number]         Weight:       147.9 lb Date of Birth:  11-08-1945           BSA:          1.817 m Patient Age:    60 years           BP:           146/81 mmHg Patient Gender: M                  HR:           109 bpm. Exam Location:  [facility] Procedure: 2D Echo,  Cardiac Doppler and Color Doppler Indications:    Dyspnea  History:        Patient has prior history of Echocardiogram examinations, most                 recent 04/30/2018. CAD, Prior CABG, TIA, Arrythmias:LBBB; Risk                 Factors:Hypertension and Dyslipidemia.  Sonographer:    Wenda Low Referring Phys: [place]  1. The anteroseptal, anterior, antoerlateral walls are akinetic. The entire apex is akinetic. Mid to distal inferior wall is akinetic. Marland Kitchen Left ventricular ejection fraction, by estimation, is 20%. The left ventricle has severely decreased function. The left ventricle demonstrates regional wall motion abnormalities (see scoring diagram/findings for description). Left ventricular diastolic parameters are indeterminate.  2. Right ventricular systolic function is normal. The right ventricular size is normal. There is moderately elevated pulmonary artery systolic pressure.  3. The mitral valve is normal in structure. Mild mitral valve regurgitation. No evidence of mitral stenosis.  4. The aortic valve is tricuspid. There is mild calcification of the aortic valve. There is mild thickening of the aortic valve. Aortic valve regurgitation is not visualized. No aortic stenosis is present.  5. The inferior vena cava is dilated in size with >50% respiratory variability, suggesting right atrial pressure of 8 mmHg. FINDINGS  Left Ventricle: The anteroseptal, anterior, antoerlateral walls are akinetic. The entire apex is akinetic. Mid to distal inferior wall is akinetic. Left ventricular ejection fraction, by estimation, is 20%. The left ventricle has severely decreased function. The left ventricle demonstrates regional wall motion abnormalities. The left ventricular internal cavity size was normal in size. There is  no left ventricular hypertrophy. Left ventricular diastolic parameters are indeterminate. Right Ventricle: The right ventricular size is normal. Right vetricular wall  thickness was not assessed. Right ventricular systolic function is normal. There is moderately elevated pulmonary artery systolic pressure. The tricuspid regurgitant velocity is  3.54 m/s, and with an assumed right atrial pressure of 8 mmHg, the estimated right ventricular systolic pressure is [id number] mmHg. Left Atrium: Left atrial size was normal in size. Right Atrium: Right atrial size was normal in size. Pericardium: There is no evidence of pericardial effusion. Mitral Valve: The mitral valve is normal in structure. Mild mitral valve regurgitation. No evidence of mitral valve stenosis. MV peak gradient, 2.5 mmHg. The mean mitral valve gradient is 1.0 mmHg. Tricuspid Valve: The tricuspid valve is not well visualized. Tricuspid valve regurgitation is mild . No evidence of tricuspid stenosis. Aortic Valve: The aortic valve is tricuspid. There is mild calcification of the aortic valve. There is mild thickening of the aortic valve. There is mild aortic valve annular calcification. Aortic valve regurgitation is not visualized. No aortic stenosis  is present. Aortic valve mean gradient measures 2.0 mmHg. Aortic valve peak gradient measures 4.0 mmHg. Aortic valve area, by VTI measures 1.80 cm. Pulmonic Valve: The pulmonic valve was not well visualized. Pulmonic valve regurgitation is not visualized. No evidence of pulmonic stenosis. Aorta: The aortic root is normal in size and structure. Venous: The inferior vena cava is dilated in size with greater than 50% respiratory variability, suggesting right atrial pressure of 8 mmHg. IAS/Shunts: No atrial level shunt detected by color flow Doppler.  LEFT VENTRICLE PLAX 2D LVIDd:         5.44 cm      Diastology LVIDs:         4.45 cm      LV e' medial:    3.81 cm/s LV PW:         1.03 cm      LV E/e' medial:  13.3 LV IVS:        1.05 cm      LV e' lateral:   6.64 cm/s LVOT diam:     2.00 cm      LV E/e' lateral: 7.6 LV SV:         29 LV SV Index:   16 LVOT Area:     3.14 cm  LV  Volumes (MOD) LV vol d, MOD A2C: 84.6 ml LV vol d, MOD A4C: 106.0 ml LV vol s, MOD A2C: 60.0 ml LV vol s, MOD A4C: 74.1 ml LV SV MOD A2C:     24.6 ml LV SV MOD A4C:     106.0 ml LV SV MOD BP:      27.2 ml RIGHT VENTRICLE RV Basal diam:  3.36 cm RV Mid diam:    2.73 cm RV S prime:     8.33 cm/s TAPSE (M-mode): 2.2 cm LEFT ATRIUM             Index       RIGHT ATRIUM           Index LA diam:        4.80 cm 2.64 cm/m  RA Area:     14.20 cm LA Vol (A2C):   45.1 ml 24.81 ml/m RA Volume:   31.10 ml  17.11 ml/m LA Vol (A4C):   57.3 ml 31.53 ml/m LA Biplane Vol: 54.0 ml 29.71 ml/m  AORTIC VALVE AV Area (Vmax):    1.99 cm AV Area (  Vmean):   1.72 cm AV Area (VTI):     1.80 cm AV Vmax:           99.70 cm/s AV Vmean:          63.100 cm/s AV VTI:            0.162 m AV Peak Grad:      4.0 mmHg AV Mean Grad:      2.0 mmHg LVOT Vmax:         63.30 cm/s LVOT Vmean:        34.600 cm/s LVOT VTI:          0.093 m LVOT/AV VTI ratio: 0.57  AORTA Ao Root diam: 3.30 cm Ao Asc diam:  3.70 cm MITRAL VALVE               TRICUSPID VALVE MV Area (PHT): 4.21 cm    TR Peak grad:   50.1 mmHg MV Area VTI:   1.84 cm    TR Vmax:        354.00 cm/s MV Peak grad:  2.5 mmHg MV Mean grad:  1.0 mmHg    SHUNTS MV Vmax:       0.80 m/s    Systemic VTI:  0.09 m MV Vmean:      52.4 cm/s   Systemic Diam: 2.00 cm MV Decel Time: 180 msec MV E velocity: 50.60 cm/s MV A velocity: 58.30 cm/s MV E/A ratio:  0.87 Carlyle Dolly MD Electronically signed by Carlyle Dolly MD Signature Date/Time: 01/22/2021/3:52:57 PM    Final      CBC Recent Labs  Lab 01/27/2021 1728 01/20/21 0445 01/21/21 0550 01/22/21 0244  WBC 7.2 5.4 10.7* 9.0  HGB 13.8 11.5* 12.1* 11.3*  HCT 40.7 35.3* 37.7* 34.2*  PLT 30* 26* 30* 29*  MCV 95.8 97.5 95.9 95.8  MCH 32.5 31.8 30.8 31.7  MCHC 33.9 32.6 32.1 33.0  RDW 14.0 13.7 13.4 13.7  LYMPHSABS 1.5  --  1.1  --   MONOABS 0.9  --  0.9  --   EOSABS 0.1  --  0.1  --   BASOSABS 0.1  --  0.0  --     Chemistries  Recent  Labs  Lab 01/09/2021 1728 01/20/21 0008 01/20/21 0445 01/21/21 0550 01/22/21 0244 01/22/21 0444  NA 149* 149* 148* 147* 138  --   K 3.3* 3.8 3.4* 3.2* 3.5  --   CL 113* 115* 113* 110 109  --   CO2 '25 24 22 22 25  '$ --   GLUCOSE 112* 129* 134* 120* 143*  --   BUN 34* 31* 30* 28* 27*  --   CREATININE 1.09 0.93 0.88 0.89 0.93  --   CALCIUM 11.9* 10.7* 10.8* 10.8* 9.3  --   MG  --   --   --   --  1.7  --   AST 42*  --  38 49*  --  48*  ALT 30  --  29 38  --  35  ALKPHOS 166*  --  138* 140*  --  112  BILITOT 1.5*  --  1.2 1.0  --  0.5   ------------------------------------------------------------------------------------------------------------------ No results for input(s): CHOL, HDL, LDLCALC, TRIG, CHOLHDL, LDLDIRECT in the last 72 hours.  No results found for: HGBA1C ------------------------------------------------------------------------------------------------------------------ Recent Labs    01/22/21 0244  TSH 1.416   ------------------------------------------------------------------------------------------------------------------ Recent Labs    01/20/21 1518 01/22/21 0444  VITAMINB12 2,056*  --   FOLATE 6.3  --  RETICCTPCT  --  1.6    Coagulation profile Recent Labs  Lab 12/31/2020 1728  INR 1.3*    No results for input(s): DDIMER in the last 72 hours.  Cardiac Enzymes No results for input(s): CKMB, TROPONINI, MYOGLOBIN in the last 168 hours.  Invalid input(s): CK ------------------------------------------------------------------------------------------------------------------ No results found for: BNP   Roxan Hockey M.D on 01/22/2021 at 5:58 PM  Go to [email or web address] - for contact info  [facility] - Office  [phone]

## 2021-01-22 NOTE — Progress Notes (Signed)
Patient found to have increased WOB, respiratory rate 25-35 on 7L HFNC. Pt placed back on Bipap with previous settings and RRT notified.  Cristino Martes, RN 01/22/2021 10:13 AM

## 2021-01-23 ENCOUNTER — Inpatient Hospital Stay (HOSPITAL_COMMUNITY): Payer: Medicare Other

## 2021-01-23 DIAGNOSIS — R6520 Severe sepsis without septic shock: Secondary | ICD-10-CM | POA: Diagnosis not present

## 2021-01-23 DIAGNOSIS — J9601 Acute respiratory failure with hypoxia: Secondary | ICD-10-CM | POA: Diagnosis present

## 2021-01-23 DIAGNOSIS — A419 Sepsis, unspecified organism: Secondary | ICD-10-CM | POA: Diagnosis not present

## 2021-01-23 LAB — COMPREHENSIVE METABOLIC PANEL
ALT: 38 U/L (ref 0–44)
AST: 107 U/L — ABNORMAL HIGH (ref 15–41)
Albumin: 2.6 g/dL — ABNORMAL LOW (ref 3.5–5.0)
Alkaline Phosphatase: 163 U/L — ABNORMAL HIGH (ref 38–126)
Anion gap: 7 (ref 5–15)
BUN: 39 mg/dL — ABNORMAL HIGH (ref 8–23)
CO2: 21 mmol/L — ABNORMAL LOW (ref 22–32)
Calcium: 9.6 mg/dL (ref 8.9–10.3)
Chloride: 109 mmol/L (ref 98–111)
Creatinine, Ser: 1.11 mg/dL (ref 0.61–1.24)
GFR, Estimated: >60 mL/min (ref >60)
Glucose, Bld: 126 mg/dL — ABNORMAL HIGH (ref 70–99)
Potassium: 4.3 mmol/L (ref 3.5–5.1)
Sodium: 137 mmol/L (ref 135–145)
Total Bilirubin: 0.8 mg/dL (ref 0.3–1.2)
Total Protein: 9.4 g/dL — ABNORMAL HIGH (ref 6.5–8.1)

## 2021-01-23 LAB — PTH, INTACT AND CALCIUM
Calcium, Total (PTH): 9.9 mg/dL (ref 8.6–10.2)
PTH: 23 pg/mL (ref 15–65)

## 2021-01-23 LAB — CBC
HCT: 36.8 % — ABNORMAL LOW (ref 39.0–52.0)
Hemoglobin: 11.7 g/dL — ABNORMAL LOW (ref 13.0–17.0)
MCH: 31 pg (ref 26.0–34.0)
MCHC: 31.8 g/dL (ref 30.0–36.0)
MCV: 97.6 fL (ref 80.0–100.0)
Platelets: 31 10*3/uL — ABNORMAL LOW (ref 150–400)
RBC: 3.77 MIL/uL — ABNORMAL LOW (ref 4.22–5.81)
RDW: 14.1 % (ref 11.5–15.5)
WBC: 11.5 10*3/uL — ABNORMAL HIGH (ref 4.0–10.5)
nRBC: 2.4 % — ABNORMAL HIGH (ref 0.0–0.2)

## 2021-01-23 LAB — GLUCOSE, CAPILLARY
Glucose-Capillary: 133 mg/dL — ABNORMAL HIGH (ref 70–99)
Glucose-Capillary: 134 mg/dL — ABNORMAL HIGH (ref 70–99)
Glucose-Capillary: 140 mg/dL — ABNORMAL HIGH (ref 70–99)
Glucose-Capillary: 153 mg/dL — ABNORMAL HIGH (ref 70–99)
Glucose-Capillary: 175 mg/dL — ABNORMAL HIGH (ref 70–99)

## 2021-01-23 LAB — TROPONIN I (HIGH SENSITIVITY): Troponin I (High Sensitivity): 839 ng/L (ref <18)

## 2021-01-23 LAB — BRAIN NATRIURETIC PEPTIDE: B Natriuretic Peptide: 1138 pg/mL — ABNORMAL HIGH (ref 0.0–100.0)

## 2021-01-23 MED ORDER — FUROSEMIDE 10 MG/ML IJ SOLN
20.0000 mg | Freq: Two times a day (BID) | INTRAMUSCULAR | Status: DC
  Administered 2021-01-23 – 2021-01-24 (×3): 20 mg via INTRAVENOUS
  Filled 2021-01-23 (×3): qty 2

## 2021-01-23 MED ORDER — VANCOMYCIN HCL 500 MG/100ML IV SOLN
500.0000 mg | Freq: Two times a day (BID) | INTRAVENOUS | Status: DC
  Administered 2021-01-24 – 2021-01-25 (×4): 500 mg via INTRAVENOUS
  Filled 2021-01-23 (×4): qty 100

## 2021-01-23 MED ORDER — FUROSEMIDE 10 MG/ML IJ SOLN
20.0000 mg | Freq: Once | INTRAMUSCULAR | Status: AC
  Administered 2021-01-23: 20 mg via INTRAVENOUS
  Filled 2021-01-23: qty 2

## 2021-01-23 MED ORDER — VANCOMYCIN HCL 1250 MG/250ML IV SOLN
1250.0000 mg | Freq: Once | INTRAVENOUS | Status: AC
  Administered 2021-01-23: 1250 mg via INTRAVENOUS
  Filled 2021-01-23: qty 250

## 2021-01-23 MED ORDER — SODIUM CHLORIDE 0.9 % IV SOLN
2.0000 g | Freq: Two times a day (BID) | INTRAVENOUS | Status: DC
  Administered 2021-01-23 – 2021-01-25 (×5): 2 g via INTRAVENOUS
  Filled 2021-01-23 (×5): qty 2

## 2021-01-23 NOTE — Progress Notes (Signed)
SLP Cancellation Note  Patient Details Name: Devin [name] MRN: [id number] DOB: 1945-11-23   Cancelled treatment:       Reason Eval/Treat Not Completed: Patient not medically ready. Pt is now on BiPAP and not appropriate for PO trials at this time. ST will continue efforts,  Jeneane Pieczynski H. Roddie Mc, CCC-SLP Speech Language Pathologist    Wende Bushy 01/23/2021, 11:57 AM

## 2021-01-23 NOTE — Progress Notes (Signed)
Pharmacy Antibiotic Note  Devin [name] a 75 y.o. male admitted on 01/23/2021 with sepsis.  Pharmacy has been consulted for vancomycin and cefepime dosing.  Plan: Vancomycin '500mg'$  IV every 12 hours.  Goal trough 15-20 mcg/mL. Cefepime 2gm IV every 12 hours.  Medical History: Past Medical History:  Diagnosis Date   Arthritis    Asthma    Carpal tunnel syndrome    Chest pain, unspecified    Chronic kidney disease    hx of kidney stones   Coronary artery disease    Coronary atherosclerosis of artery bypass graft    GERD (gastroesophageal reflux disease)    History of kidney stones    HOH (hard of hearing)    Hypertension    Occlusion and stenosis of carotid artery without mention of cerebral infarction    Other and unspecified hyperlipidemia    Personal history of unspecified circulatory disease    Shortness of breath    Stroke ([place])    TIA (transient ischemic attack)     Allergies:  Allergies  Allergen Reactions   Aspirin Other (See Comments)    Has to take the coated aspirin    Filed Weights   01/18/2021 1912 01/20/21 0733 01/21/21 2219  Weight: 74.4 kg (164 lb) 66.2 kg (145 lb 15.1 oz) 67.1 kg (147 lb 14.9 oz)    CBC Latest Ref Rng & Units 01/23/2021 01/22/2021 01/21/2021  WBC 4.0 - 10.5 K/uL 11.5(H) 9.0 10.7(H)  Hemoglobin 13.0 - 17.0 g/dL 11.7(L) 11.3(L) 12.1(L)  Hematocrit 39.0 - 52.0 % 36.8(L) 34.2(L) 37.7(L)  Platelets 150 - 400 K/uL 31(L) 29(LL) 30(L)     Estimated Creatinine Clearance: 55.4 mL/min (by C-G formula based on SCr of 1.11 mg/dL).  Antibiotics Given (last 72 hours)     Date/Time Action Medication Dose Rate   01/20/21 2009 New Bag/Given   cefTRIAXone (ROCEPHIN) 1 g in sodium chloride 0.9 % 100 mL IVPB 1 g 200 mL/hr   01/20/21 2207 New Bag/Given   azithromycin (ZITHROMAX) 500 mg in sodium chloride 0.9 % 250 mL IVPB 500 mg 250 mL/hr   01/21/21 2125 New Bag/Given   azithromycin (ZITHROMAX) 500 mg in sodium chloride 0.9 % 250 mL IVPB 500 mg 250  mL/hr   01/21/21 2245 New Bag/Given   cefTRIAXone (ROCEPHIN) 2 g in sodium chloride 0.9 % 100 mL IVPB 2 g 200 mL/hr   01/22/21 2043 New Bag/Given   cefTRIAXone (ROCEPHIN) 2 g in sodium chloride 0.9 % 100 mL IVPB 2 g 200 mL/hr   01/22/21 2122 New Bag/Given   azithromycin (ZITHROMAX) 500 mg in sodium chloride 0.9 % 250 mL IVPB 500 mg 250 mL/hr       Antimicrobials this admission:  Cefepime 01/23/2021  >>  vancomycin 01/23/2021  >>    Microbiology results: 01/23/2021  BCx: sent 01/23/2021  UCx: sent 01/23/2021  Resp Panel: sent  01/23/2021  MRSA PCR: sent  Thank you for allowing pharmacy to be a part of this patient's care.  Thomasenia Sales, PharmD Clinical Pharmacist

## 2021-01-23 NOTE — Progress Notes (Addendum)
Patient Demographics:    Devin [name], is a 75 y.o. male, DOB - [birth year], [id number]  Admit date - 01/07/2021   Admitting Physician Kyden Potash Denton Brick, MD  Outpatient Primary MD for the patient is [name], MD  LOS - 4   Chief Complaint  Patient presents with   Altered Mental Status        Subjective:    [name] today has no fevers, no emesis,  No chest pain,   Wife , daughter-in-law and granddaughter at bedside -Respiratory status remains very tenuous -Desaturates very quickly with attempt to take him off BiPAP -Quite tachypneic --Overall prognosis is Not encouraging  Assessment  & Plan :    Principal Problem:   Severe sepsis with acute organ dysfunction due to Rt Sided PNA Active Problems:   CAP (community acquired pneumonia)   Acute HFrEF (heart failure with reduced ejection fraction) --EF 20 %   NSTEMI (non-ST elevated myocardial infarction) Type 2 Due to Sepsis   Acute respiratory failure with hypoxia due to CHF and pneumonia   PAD (peripheral artery disease) (HCC)   Hyperlipidemia   Hypertension   Hypernatremia   Hypercalcemia   Dehydration   Hypokalemia   Acute metabolic encephalopathy   Thrombocytopenia ([place])  Brief Summary:-  74 y.o. male, with history of coronary artery disease, GERD, hypertension, stenosis of carotid artery, COPD, TIA admitted on 01/01/2021 with severe sepsis secondary to community-acquired pneumonia and hypoxia and persistent thrombocytopenia  A/p  1) severe sepsis with acute hypoxic respiratory failure i secondary to community-acquired pneumonia (RUL)-- Patient meets sepsis criteria on admission with tachycardia and tachypnea and finding of right upper lobe pneumonia -01/23/21 -Respiratory status remains very tenuous -Desaturates very quickly with attempt to take him off BiPAP -Significantly increased work of breathing and persistent  tachypnea -HIV negative -Chest x-ray from 01/21/2021 and repeat on 01/22/21 and 01/23/21 showed worsening right-sided pneumonia -CT abdomen and pelvis with right-sided pneumonia otherwise no acute intra-abdominal findings -Continue bronchodilators and mucolytics -Strep pneumo antigen negative, Legionella pending -Given worsening pneumonia stop Rocephin and azithromycin and start on Vanco and cefepime instead as of 01/23/2021 Discussed with pulmonary critical care attending Dr. Halford Chessman previously  2) severe thrombocytopenia--- discussed with Dr. Delton Coombes -Patient with longstanding history of thrombocytopenia platelets usually around 100 K -Platelets 30 >> 26 >>30>>29 k>>31k -Pathology smear reviewed  -LDH elevated at 666>>956 -Serum copper is low at 30 -B12 is not low, -Folate is normal -PTT is 34, INR is 1.3, Fibrinogen is low normal L at 219 -We will transfuse if platelet count less than 10 K or concerns for bleeding -Stop Pletal -Per Dr. Delton Coombes okay to give IVIG for possible sepsis induced thrombocytopenia Versus ITP -Continue steroids  3) chronic anemia-with normal MCV MCH and RDW -Patient hemoglobin usually between 10 and 11, on admission hemoglobin was 13.8 most likely due to dehydration, post hydration hgb is now close to baseline -No obvious bleeding noted  4) HFrEF/NSTEMI--- suspect type II NSTEMI due to demand ischemia in the setting of sepsis and pneumonia with acute respiratory failure and hypoxia--compounded by acute systolic dysfunction CHF exacerbation -No chest pain Troponin--- 41>>535>>786>>740>>725>>839 BNP 1,138 -Discussed with Dr Harl Bowie Pt is -Not a candidate for IV heparin or antiplatelet agent and Not a candidate  for LHC due to severe , persistent thrombocytopenia -Continue IV metoprolol -Sepsis IV fluids on hold -IV Lasix as ordered -  5) acute metabolic encephalopathy---secondary to #1 above, and #8 below -CT head negative for acute findings -MRI brain  without acute finding -Suspect related to infection and HF  6)HTN-- iv metoprolol as ordered  7)PAD--s/p bilateral femoropopliteal bypass, with chronic occlusion of right graft  -Pletal discontinued due to severe thrombocytopenia  8) hypernatremia and hypercalcemia/hypokalemia--may be dehydration related, recheck after further hydration -=-Sodium normalized with hydration Be judicious with IV fluid due to heart failure  9) acute COPD exacerbation--- secondary to #1 above, manage as above #1  10)Social/ethics--- discussed with patient's wife and grand-daughter- she request DNR/DNI status ,however request full scope of treatment without limitations -Overall prognosis is Not encouraging  11)Disposition/Need for in-Hospital Stay- patient unable to be discharged at this time due to severe sepsis secondary to pneumonia requiring IV antibiotics and NSTEMI with heart failure requiring further support with BiPAP Status is: Inpatient  Remains inpatient appropriate because: Please see disposition above  Disposition: The patient is from: Home              Anticipated d/c is to: SNF              Anticipated d/c date is: > 3 days              Patient currently is not medically stable to d/c. Barriers: Not Clinically Stable-   Code Status :  -  Code Status: DNR   Family Communication:    (patient is alert, awake and coherent)  Discussed with wife at [phone]----and in Person at bedside Discussed with granddaughter [name] and daugther in law Consults  : Dr. Delton Coombes -Curbside consult with Dr. Halford Chessman from pulmonary critical care service  DVT Prophylaxis  :   - SCDs *  SCDs Start: 01/27/2021 2246  Lab Results  Component Value Date   PLT 31 (L) 01/23/2021    Inpatient Medications  Scheduled Meds:  amLODipine  10 mg Oral Daily   atorvastatin  20 mg Oral Daily   Chlorhexidine Gluconate Cloth  6 each Topical Daily   ferrous sulfate  325 mg Oral Q breakfast   furosemide  20 mg  Intravenous Q12H   ipratropium  0.5 mg Nebulization Q6H   levalbuterol  1.25 mg Nebulization Q6H   methylPREDNISolone (SOLU-MEDROL) injection  40 mg Intravenous Q12H   metoprolol tartrate  5 mg Intravenous Q6H   mometasone-formoterol  2 puff Inhalation BID   vitamin B-12  1,000 mcg Oral Daily   Continuous Infusions:  dextrose 5 % and 0.45% NaCl 10 mL/hr at 01/23/21 0930   PRN Meds:.hydrALAZINE, LORazepam    Anti-infectives (From admission, onward)    Start     Dose/Rate Route Frequency Ordered Stop   01/21/21 2000  cefTRIAXone (ROCEPHIN) 2 g in sodium chloride 0.9 % 100 mL IVPB  Status:  Discontinued        2 g 200 mL/hr over 30 Minutes Intravenous Every 24 hours 01/21/21 1750 01/23/21 1742   01/20/21 2000  cefTRIAXone (ROCEPHIN) 1 g in sodium chloride 0.9 % 100 mL IVPB  Status:  Discontinued        1 g 200 mL/hr over 30 Minutes Intravenous Every 24 hours 01/10/2021 2245 01/21/21 1750   01/18/2021 2100  azithromycin (ZITHROMAX) 500 mg in sodium chloride 0.9 % 250 mL IVPB  Status:  Discontinued        500 mg 250 mL/hr  over 60 Minutes Intravenous Every 24 hours 01/01/2021 2056 01/23/21 1742   01/15/2021 1900  cefTRIAXone (ROCEPHIN) 1 g in sodium chloride 0.9 % 100 mL IVPB        1 g 200 mL/hr over 30 Minutes Intravenous  Once 01/06/2021 1851 01/21/2021 1949         Objective:   Vitals:   01/23/21 1400 01/23/21 1500 01/23/21 1600 01/23/21 1700  BP: (!) 153/108 (!) 165/101 (!) 155/100 (!) 159/106  Pulse: (!) 101 (!) 114 (!) 120 (!) 115  Resp: (!) 39 (!) 40 (!) 38 (!) 38  Temp:   98.5 F (36.9 C)   TempSrc:   Axillary   SpO2: (!) 88% 90% 92% 93%  Weight:      Height:        Wt Readings from Last 3 Encounters:  01/21/21 67.1 kg  04/14/20 75.4 kg  12/23/19 73.4 kg     Intake/Output Summary (Last 24 hours) at 01/23/2021 1751 Last data filed at 01/23/2021 1700 Gross per 24 hour  Intake 1032.26 ml  Output 550 ml  Net 482.26 ml   Physical Exam  Gen:-Confused, disoriented   HEENT:- [place].AT, No sclera icterus Nose-  BiPAP Neck-Supple Neck,No JVD,.  Lungs-diminished air movement on the right, no wheezing,  CV- S1, S2 normal, regular , tachycardic Abd-  +ve B.Sounds, Abd Soft, No tenderness,    Extremity/Skin:- No  edema, pedal pulses present , no significant ecchymosis, no petechia no bleeding concerns on mucosal or skin Psych-affect is anxious, restless confused,  Neuro-generalized weakness, no new focal deficits, no tremors   Data Review:   Micro Results Recent Results (from the past 240 hour(s))  Blood culture (routine x 2)     Status: None (Preliminary result)   Collection Time: 01/22/2021  6:35 PM   Specimen: BLOOD RIGHT HAND  Result Value Ref Range Status   Specimen Description BLOOD RIGHT HAND  Final   Special Requests   Final    BOTTLES DRAWN AEROBIC AND ANAEROBIC Blood Culture adequate volume   Culture   Final    NO GROWTH 4 DAYS Performed at [facility], [address] [place], [place] [address]    Report Status PENDING  Incomplete  Blood culture (routine x 2)     Status: None (Preliminary result)   Collection Time: 01/21/2021  6:35 PM   Specimen: BLOOD LEFT HAND  Result Value Ref Range Status   Specimen Description BLOOD LEFT HAND  Final   Special Requests   Final    BOTTLES DRAWN AEROBIC AND ANAEROBIC Blood Culture adequate volume   Culture   Final    NO GROWTH 4 DAYS Performed at [facility], [address], [place], [place] [address]    Report Status PENDING  Incomplete  Resp Panel by RT-PCR (Flu A&B, Covid) Nasopharyngeal Swab     Status: None   Collection Time: 01/18/2021  6:37 PM   Specimen: Nasopharyngeal Swab; Nasopharyngeal(NP) swabs in vial transport medium  Result Value Ref Range Status   SARS Coronavirus 2 by RT PCR NEGATIVE NEGATIVE Final    Comment: (NOTE) SARS-CoV-2 target nucleic acids are NOT DETECTED.  The SARS-CoV-2 RNA is generally detectable in upper respiratory specimens during the acute phase of infection. The  lowest concentration of SARS-CoV-2 viral copies this assay can detect is 138 copies/mL. A negative result does not preclude SARS-Cov-2 infection and should not be used as the sole basis for treatment or other patient management decisions. A negative result may occur  with  improper specimen collection/handling, submission of specimen other than nasopharyngeal swab, presence of viral mutation(s) within the areas targeted by this assay, and inadequate number of viral copies(<138 copies/mL). A negative result must be combined with clinical observations, patient history, and epidemiological information. The expected result is Negative.  Fact Sheet for Patients:  EntrepreneurPulse.com.au  Fact Sheet for Healthcare Providers:  IncredibleEmployment.be  This test is no t yet approved or cleared by the [place] FDA and  has been authorized for detection and/or diagnosis of SARS-CoV-2 by FDA under an Emergency Use Authorization (EUA). This EUA will remain  in effect (meaning this test can be used) for the duration of the COVID-19 declaration under Section 564(b)(1) of the Act, 21 U.S.C.section 360bbb-3(b)(1), unless the authorization is terminated  or revoked sooner.       Influenza A by PCR NEGATIVE NEGATIVE Final   Influenza B by PCR NEGATIVE NEGATIVE Final    Comment: (NOTE) The Xpert Xpress SARS-CoV-2/FLU/RSV plus assay is intended as an aid in the diagnosis of influenza from Nasopharyngeal swab specimens and should not be used as a sole basis for treatment. Nasal washings and aspirates are unacceptable for Xpert Xpress SARS-CoV-2/FLU/RSV testing.  Fact Sheet for Patients: EntrepreneurPulse.com.au  Fact Sheet for Healthcare Providers: IncredibleEmployment.be  This test is not yet approved or cleared by the [place] FDA and has been authorized for detection and/or diagnosis of SARS-CoV-2 by FDA under  an Emergency Use Authorization (EUA). This EUA will remain in effect (meaning this test can be used) for the duration of the COVID-19 declaration under Section 564(b)(1) of the Act, 21 U.S.C. section 360bbb-3(b)(1), unless the authorization is terminated or revoked.  Performed at [facility], [address] [place], [place] [address]   MRSA Next Gen by PCR, Nasal     Status: None   Collection Time: 01/21/21  9:13 PM   Specimen: Nasal Mucosa; Nasal Swab  Result Value Ref Range Status   MRSA by PCR Next Gen NOT DETECTED NOT DETECTED Final    Comment: (NOTE) The GeneXpert MRSA Assay (FDA approved for NASAL specimens only), is one component of a comprehensive MRSA colonization surveillance program. It is not intended to diagnose MRSA infection nor to guide or monitor treatment for MRSA infections. Test performance is not FDA approved in patients less than 41 years old. Performed at [facility], [address], [place], [place] [address]     Radiology Reports CT HEAD WO CONTRAST (5MM)  Result Date: 01/18/2021 CLINICAL DATA:  Mental status changes EXAM: CT HEAD WITHOUT CONTRAST TECHNIQUE: Contiguous axial images were obtained from the base of the skull through the vertex without intravenous contrast. COMPARISON:  11/27/2001 FINDINGS: Brain: There is atrophy and chronic small vessel disease changes. No acute intracranial abnormality. Specifically, no hemorrhage, hydrocephalus, mass lesion, acute infarction, or significant intracranial injury. Vascular: No hyperdense vessel or unexpected calcification. Skull: No acute calvarial abnormality. Sinuses/Orbits: No acute findings Other: None IMPRESSION: Atrophy, chronic microvascular disease. No acute intracranial abnormality. Electronically Signed   By: Rolm Baptise M.D.   On: 01/03/2021 18:58   MR BRAIN WO CONTRAST  Result Date: 01/21/2021 CLINICAL DATA:  Mental status change, unknown cause. EXAM: MRI HEAD WITHOUT CONTRAST TECHNIQUE: Multiplanar,  multiecho pulse sequences of the brain and surrounding structures were obtained without intravenous contrast. COMPARISON:  Head CT January 19, 2021 FINDINGS: The study is partially degraded by motion. Brain: No acute infarction, hemorrhage, hydrocephalus, extra-axial collection or mass lesion. Scattered and confluent foci of T2 hyperintensity  are seen within the white matter of the cerebral hemispheres nonspecific, most likely related to chronic small vessel ischemia. Moderate parenchymal volume loss. Vascular: Normal flow voids. Skull and upper cervical spine: Negative. Sinuses/Orbits: Bilateral lens surgery. Paranasal sinuses are clear. Other: None. IMPRESSION: 1. Study partially limited by motion. 2. No acute intracranial abnormality. 3. Moderate chronic ischemic changes of the white matter and parenchymal volume. Electronically Signed   By: Pedro Earls M.D.   On: 01/21/2021 13:17   CT ABDOMEN PELVIS W CONTRAST  Result Date: 01/21/2021 CLINICAL DATA:  Acute abdominal pain, nonlocalized. Generalized abdominal pain, weakness, anemia, sepsis secondary to community-acquired pneumonia. EXAM: CT ABDOMEN AND PELVIS WITH CONTRAST TECHNIQUE: Multidetector CT imaging of the abdomen and pelvis was performed using the standard protocol following bolus administration of intravenous contrast. CONTRAST:  71m OMNIPAQUE IOHEXOL 350 MG/ML SOLN COMPARISON:  Chest radiograph 01/21/2021 FINDINGS: Lower chest: Airspace consolidation in the right lower lung consistent with pneumonia. Cardiac enlargement. Postoperative changes in the mediastinum. Hepatobiliary: Mild diffuse fatty infiltration of the liver. Poorly defined focal low-attenuation lesion in segment 7 of the liver measuring 1.2 cm diameter. There appears to be vague peripheral enhancement. While this may represent a hemangioma, the CT appearance is indeterminate. Consider further characterization with MRI in the nonemergent setting as clinically  indicated. Cholelithiasis with small stone in the dependent gallbladder. No inflammatory changes. Bile ducts are not dilated. Pancreas: Unremarkable. No pancreatic ductal dilatation or surrounding inflammatory changes. Spleen: Normal in size without focal abnormality. Adrenals/Urinary Tract: Adrenal glands are unremarkable. Kidneys are normal, without renal calculi, focal lesion, or hydronephrosis. Bladder is unremarkable. Stomach/Bowel: Stomach, small bowel, and colon are not abnormally distended. Stool throughout the colon. A patent medication also demonstrated in the colon. No wall thickening or inflammatory changes appreciated. Appendix is not identified. Vascular/Lymphatic: Diffuse prominent calcification of the aorta and branch vessels. No aneurysm. Acute change in diameter of the distal common femoral artery on the right with absence of flow distally suggesting thrombosis or occlusion. Suggestion of a femoral bypass graft which is also occluded. There is no retroperitoneal lymphadenopathy. There is evidence of lymphadenopathy in the porta hepatis, with enlarged lymph nodes in the hepatic hilum measuring 1.8 cm short axis dimension. Reproductive: Prostate is unremarkable. Other: No free air or free fluid in the abdomen. Abdominal wall musculature appears intact. Musculoskeletal: Degenerative changes in the spine. Old right rib fractures. IMPRESSION: 1. Airspace consolidation in the right lower lung consistent with pneumonia. 2. Indeterminate lesion in segment 7 of the liver. Consider further characterization with nonemergent MRI if clinically indicated. 3. Lymphadenopathy in the hepatic hilum with multiple lymph nodes measuring up to 1.8 cm short axis dimension. Nonspecific etiology. Malignant/metastatic versus reactive. 4. Absence of flow demonstrated in the distal common femoral artery extending into the superficial femoral artery on the right. Possible thrombosed bypass graft, incompletely visualized.  Electronically Signed   By: WLucienne CapersM.D.   On: 01/21/2021 21:14   DG Chest Port 1 View  Result Date: 01/23/2021 CLINICAL DATA:  Shortness of breath, right-sided pneumonia EXAM: PORTABLE CHEST 1 VIEW COMPARISON:  01/22/2021 FINDINGS: Multifocal patchy opacities, right upper lobe predominant, progressive. No pleural effusion or pneumothorax. The heart is top-normal in size. Postsurgical changes related to prior CABG. Thoracic aortic atherosclerosis. Median sternotomy. IMPRESSION: Multifocal patchy opacities, right upper lobe predominant, progressive. This appearance is compatible with worsening pneumonia. Electronically Signed   By: SJulian HyM.D.   On: 01/23/2021 03:56   DG CHEST PORT 1  VIEW  Result Date: 01/22/2021 CLINICAL DATA:  Pneumonia EXAM: PORTABLE CHEST 1 VIEW COMPARISON:  01/21/2021 FINDINGS: No significant change in AP portable chest radiograph with diffuse heterogeneous and interstitial airspace opacity, somewhat coarse appearing, and cardiomegaly status post median sternotomy. IMPRESSION: 1. No significant change in AP portable chest radiograph with diffuse heterogeneous and interstitial airspace opacity, somewhat coarse appearing, this may reflect a combination of acute airspace disease and chronic underlying interstitial change. This could be further assessed by CT if desired. 2.  Cardiomegaly status post median sternotomy. Electronically Signed   By: Eddie Candle M.D.   On: 01/22/2021 07:59   DG CHEST PORT 1 VIEW  Result Date: 01/21/2021 CLINICAL DATA:  Altered level of consciousness, short of breath EXAM: PORTABLE CHEST 1 VIEW COMPARISON:  12/29/2020 FINDINGS: Single frontal view of the chest demonstrates a stable cardiac silhouette. There is chronic background interstitial scarring, with superimposed right perihilar airspace disease that has progressed since prior study. No effusion or pneumothorax. No acute bony abnormalities. IMPRESSION: 1. Increasing right perihilar  airspace disease, consistent with acute pneumonia superimposed upon chronic background lung scarring. Electronically Signed   By: Randa Ngo M.D.   On: 01/21/2021 17:34   DG Chest Port 1 View  Result Date: 01/23/2021 CLINICAL DATA:  Cough. EXAM: PORTABLE CHEST 1 VIEW COMPARISON:  Chest x-ray dated July 14, 2018. FINDINGS: Stable cardiomediastinal silhouette status post CABG. Patchy opacities in the right upper lobe. No pleural effusion or pneumothorax. No acute osseous abnormality. IMPRESSION: 1. Right upper lobe pneumonia. Electronically Signed   By: Titus Dubin M.D.   On: 01/08/2021 18:10   ECHOCARDIOGRAM COMPLETE  Result Date: 01/22/2021    ECHOCARDIOGRAM REPORT   Patient Name:   JUN ZEITZ Date of Exam: 01/22/2021 Medical Rec #:  [id number]          Height:       69.0 in Accession #:    [id number]         Weight:       147.9 lb Date of Birth:  January 09, 1946           BSA:          1.817 m Patient Age:    7 years           BP:           146/81 mmHg Patient Gender: M                  HR:           109 bpm. Exam Location:  [facility] Procedure: 2D Echo, Cardiac Doppler and Color Doppler Indications:    Dyspnea  History:        Patient has prior history of Echocardiogram examinations, most                 recent 04/30/2018. CAD, Prior CABG, TIA, Arrythmias:LBBB; Risk                 Factors:Hypertension and Dyslipidemia.  Sonographer:    Wenda Low Referring Phys: [place]  1. The anteroseptal, anterior, antoerlateral walls are akinetic. The entire apex is akinetic. Mid to distal inferior wall is akinetic. Marland Kitchen Left ventricular ejection fraction, by estimation, is 20%. The left ventricle has severely decreased function. The left ventricle demonstrates regional wall motion abnormalities (see scoring diagram/findings for description). Left ventricular diastolic parameters are indeterminate.  2. Right ventricular systolic function is normal. The right ventricular size  is normal.  There is moderately elevated pulmonary artery systolic pressure.  3. The mitral valve is normal in structure. Mild mitral valve regurgitation. No evidence of mitral stenosis.  4. The aortic valve is tricuspid. There is mild calcification of the aortic valve. There is mild thickening of the aortic valve. Aortic valve regurgitation is not visualized. No aortic stenosis is present.  5. The inferior vena cava is dilated in size with >50% respiratory variability, suggesting right atrial pressure of 8 mmHg. FINDINGS  Left Ventricle: The anteroseptal, anterior, antoerlateral walls are akinetic. The entire apex is akinetic. Mid to distal inferior wall is akinetic. Left ventricular ejection fraction, by estimation, is 20%. The left ventricle has severely decreased function. The left ventricle demonstrates regional wall motion abnormalities. The left ventricular internal cavity size was normal in size. There is no left ventricular hypertrophy. Left ventricular diastolic parameters are indeterminate. Right Ventricle: The right ventricular size is normal. Right vetricular wall thickness was not assessed. Right ventricular systolic function is normal. There is moderately elevated pulmonary artery systolic pressure. The tricuspid regurgitant velocity is  3.54 m/s, and with an assumed right atrial pressure of 8 mmHg, the estimated right ventricular systolic pressure is [id number] mmHg. Left Atrium: Left atrial size was normal in size. Right Atrium: Right atrial size was normal in size. Pericardium: There is no evidence of pericardial effusion. Mitral Valve: The mitral valve is normal in structure. Mild mitral valve regurgitation. No evidence of mitral valve stenosis. MV peak gradient, 2.5 mmHg. The mean mitral valve gradient is 1.0 mmHg. Tricuspid Valve: The tricuspid valve is not well visualized. Tricuspid valve regurgitation is mild . No evidence of tricuspid stenosis. Aortic Valve: The aortic valve is tricuspid. There is  mild calcification of the aortic valve. There is mild thickening of the aortic valve. There is mild aortic valve annular calcification. Aortic valve regurgitation is not visualized. No aortic stenosis  is present. Aortic valve mean gradient measures 2.0 mmHg. Aortic valve peak gradient measures 4.0 mmHg. Aortic valve area, by VTI measures 1.80 cm. Pulmonic Valve: The pulmonic valve was not well visualized. Pulmonic valve regurgitation is not visualized. No evidence of pulmonic stenosis. Aorta: The aortic root is normal in size and structure. Venous: The inferior vena cava is dilated in size with greater than 50% respiratory variability, suggesting right atrial pressure of 8 mmHg. IAS/Shunts: No atrial level shunt detected by color flow Doppler.  LEFT VENTRICLE PLAX 2D LVIDd:         5.44 cm      Diastology LVIDs:         4.45 cm      LV e' medial:    3.81 cm/s LV PW:         1.03 cm      LV E/e' medial:  13.3 LV IVS:        1.05 cm      LV e' lateral:   6.64 cm/s LVOT diam:     2.00 cm      LV E/e' lateral: 7.6 LV SV:         29 LV SV Index:   16 LVOT Area:     3.14 cm  LV Volumes (MOD) LV vol d, MOD A2C: 84.6 ml LV vol d, MOD A4C: 106.0 ml LV vol s, MOD A2C: 60.0 ml LV vol s, MOD A4C: 74.1 ml LV SV MOD A2C:     24.6 ml LV SV MOD A4C:     106.0 ml LV SV MOD BP:  27.2 ml RIGHT VENTRICLE RV Basal diam:  3.36 cm RV Mid diam:    2.73 cm RV S prime:     8.33 cm/s TAPSE (M-mode): 2.2 cm LEFT ATRIUM             Index       RIGHT ATRIUM           Index LA diam:        4.80 cm 2.64 cm/m  RA Area:     14.20 cm LA Vol (A2C):   45.1 ml 24.81 ml/m RA Volume:   31.10 ml  17.11 ml/m LA Vol (A4C):   57.3 ml 31.53 ml/m LA Biplane Vol: 54.0 ml 29.71 ml/m  AORTIC VALVE AV Area (Vmax):    1.99 cm AV Area (Vmean):   1.72 cm AV Area (VTI):     1.80 cm AV Vmax:           99.70 cm/s AV Vmean:          63.100 cm/s AV VTI:            0.162 m AV Peak Grad:      4.0 mmHg AV Mean Grad:      2.0 mmHg LVOT Vmax:         63.30 cm/s  LVOT Vmean:        34.600 cm/s LVOT VTI:          0.093 m LVOT/AV VTI ratio: 0.57  AORTA Ao Root diam: 3.30 cm Ao Asc diam:  3.70 cm MITRAL VALVE               TRICUSPID VALVE MV Area (PHT): 4.21 cm    TR Peak grad:   50.1 mmHg MV Area VTI:   1.84 cm    TR Vmax:        354.00 cm/s MV Peak grad:  2.5 mmHg MV Mean grad:  1.0 mmHg    SHUNTS MV Vmax:       0.80 m/s    Systemic VTI:  0.09 m MV Vmean:      52.4 cm/s   Systemic Diam: 2.00 cm MV Decel Time: 180 msec MV E velocity: 50.60 cm/s MV A velocity: 58.30 cm/s MV E/A ratio:  0.87 Carlyle Dolly MD Electronically signed by Carlyle Dolly MD Signature Date/Time: 01/22/2021/3:52:57 PM    Final      CBC Recent Labs  Lab 12/30/2020 1728 01/20/21 0445 01/21/21 0550 01/22/21 0244 01/23/21 0408  WBC 7.2 5.4 10.7* 9.0 11.5*  HGB 13.8 11.5* 12.1* 11.3* 11.7*  HCT 40.7 35.3* 37.7* 34.2* 36.8*  PLT 30* 26* 30* 29* 31*  MCV 95.8 97.5 95.9 95.8 97.6  MCH 32.5 31.8 30.8 31.7 31.0  MCHC 33.9 32.6 32.1 33.0 31.8  RDW 14.0 13.7 13.4 13.7 14.1  LYMPHSABS 1.5  --  1.1  --   --   MONOABS 0.9  --  0.9  --   --   EOSABS 0.1  --  0.1  --   --   BASOSABS 0.1  --  0.0  --   --     Chemistries  Recent Labs  Lab 12/31/2020 1728 01/20/21 0008 01/20/21 0445 01/21/21 0550 01/22/21 0244 01/22/21 0444 01/23/21 0408  NA 149* 149* 148* 147* 138  --  137  K 3.3* 3.8 3.4* 3.2* 3.5  --  4.3  CL 113* 115* 113* 110 109  --  109  CO2 '25 24 22 22 25  '$ --  21*  GLUCOSE 112* 129* 134* 120* 143*  --  126*  BUN 34* 31* 30* 28* 27*  --  39*  CREATININE 1.09 0.93 0.88 0.89 0.93  --  1.11  CALCIUM 11.9* 10.7* 10.8* 10.8* 9.3  9.9  --  9.6  MG  --   --   --   --  1.7  --   --   AST 42*  --  38 49*  --  48* 107*  ALT 30  --  29 38  --  35 38  ALKPHOS 166*  --  138* 140*  --  112 163*  BILITOT 1.5*  --  1.2 1.0  --  0.5 0.8   ------------------------------------------------------------------------------------------------------------------ No results for input(s): CHOL,  HDL, LDLCALC, TRIG, CHOLHDL, LDLDIRECT in the last 72 hours.  No results found for: HGBA1C ------------------------------------------------------------------------------------------------------------------ Recent Labs    01/22/21 0244  TSH 1.416   ------------------------------------------------------------------------------------------------------------------ Recent Labs    01/22/21 0444  RETICCTPCT 1.6    Coagulation profile Recent Labs  Lab 01/03/2021 1728  INR 1.3*    No results for input(s): DDIMER in the last 72 hours.  Cardiac Enzymes No results for input(s): CKMB, TROPONINI, MYOGLOBIN in the last 168 hours.  Invalid input(s): CK ------------------------------------------------------------------------------------------------------------------    Component Value Date/Time   BNP 1,138.0 (H) 01/23/2021 [id number]     Roxan Hockey M.D on 01/23/2021 at 5:51 PM  Go to [email or web address] - for contact info  [facility] - Office  [phone]

## 2021-01-23 NOTE — Plan of Care (Signed)
  Problem: Clinical Measurements: Goal: Ability to maintain clinical measurements within normal limits will improve Outcome: Progressing Goal: Will remain free from infection Outcome: Progressing   Problem: Activity: Goal: Risk for activity intolerance will decrease Outcome: Progressing   Problem: Nutrition: Goal: Adequate nutrition will be maintained Outcome: Progressing   Problem: Coping: Goal: Level of anxiety will decrease Outcome: Progressing   Problem: Elimination: Goal: Will not experience complications related to bowel motility Outcome: Progressing Goal: Will not experience complications related to urinary retention Outcome: Progressing   Problem: Pain Managment: Goal: General experience of comfort will improve Outcome: Progressing   Problem: Safety: Goal: Ability to remain free from injury will improve Outcome: Progressing   Problem: Skin Integrity: Goal: Risk for impaired skin integrity will decrease Outcome: Progressing   Problem: Education: Goal: Knowledge of General Education information will improve Description: Including pain rating scale, medication(s)/side effects and non-pharmacologic comfort measures Outcome: Not Progressing   Problem: Health Behavior/Discharge Planning: Goal: Ability to manage health-related needs will improve Outcome: Not Progressing   Problem: Clinical Measurements: Goal: Diagnostic test results will improve Outcome: Not Progressing Goal: Respiratory complications will improve Outcome: Not Progressing Goal: Cardiovascular complication will be avoided Outcome: Not Progressing

## 2021-01-23 NOTE — Progress Notes (Signed)
RN called due to concern that patient may be fluid overloaded due to worsening shortness of breath.  Chest x-ray was ordered, fluid was held.  Chest x-ray showed multifocal patchy opacities, right upper lobe predominant, progressive. This appearance is compatible with worsening pneumonia.  Patient was already on antibiotics.  IV Lasix 20 mg x 1 was given.

## 2021-01-23 NOTE — Progress Notes (Addendum)
Critical Value  Troponin 839  Received 01/23/2021 at [facility]  Dr. Josephine Cables notified via secure chat.  "ok, thanks. Deferred to the AM Doc'"

## 2021-01-24 ENCOUNTER — Inpatient Hospital Stay (HOSPITAL_COMMUNITY): Payer: Medicare Other

## 2021-01-24 DIAGNOSIS — R6520 Severe sepsis without septic shock: Secondary | ICD-10-CM | POA: Diagnosis not present

## 2021-01-24 DIAGNOSIS — A419 Sepsis, unspecified organism: Secondary | ICD-10-CM | POA: Diagnosis not present

## 2021-01-24 LAB — CBC
HCT: 39.6 % (ref 39.0–52.0)
Hemoglobin: 12.2 g/dL — ABNORMAL LOW (ref 13.0–17.0)
MCH: 31.4 pg (ref 26.0–34.0)
MCHC: 30.8 g/dL (ref 30.0–36.0)
MCV: 101.8 fL — ABNORMAL HIGH (ref 80.0–100.0)
Platelets: 60 10*3/uL — ABNORMAL LOW (ref 150–400)
RBC: 3.89 MIL/uL — ABNORMAL LOW (ref 4.22–5.81)
RDW: 14.3 % (ref 11.5–15.5)
WBC: 11.8 10*3/uL — ABNORMAL HIGH (ref 4.0–10.5)
nRBC: 6.4 % — ABNORMAL HIGH (ref 0.0–0.2)

## 2021-01-24 LAB — URINE CULTURE: Culture: NO GROWTH

## 2021-01-24 LAB — CULTURE, BLOOD (ROUTINE X 2)
Culture: NO GROWTH
Culture: NO GROWTH
Special Requests: ADEQUATE
Special Requests: ADEQUATE

## 2021-01-24 LAB — GLUCOSE, CAPILLARY
Glucose-Capillary: 133 mg/dL — ABNORMAL HIGH (ref 70–99)
Glucose-Capillary: 125 mg/dL — ABNORMAL HIGH (ref 70–99)
Glucose-Capillary: 139 mg/dL — ABNORMAL HIGH (ref 70–99)
Glucose-Capillary: 158 mg/dL — ABNORMAL HIGH (ref 70–99)
Glucose-Capillary: 133 mg/dL — ABNORMAL HIGH (ref 70–99)

## 2021-01-24 LAB — RENAL FUNCTION PANEL
Albumin: 2.7 g/dL — ABNORMAL LOW (ref 3.5–5.0)
Anion gap: 7 (ref 5–15)
BUN: 76 mg/dL — ABNORMAL HIGH (ref 8–23)
CO2: 26 mmol/L (ref 22–32)
Calcium: 10 mg/dL (ref 8.9–10.3)
Chloride: 112 mmol/L — ABNORMAL HIGH (ref 98–111)
Creatinine, Ser: 1.55 mg/dL — ABNORMAL HIGH (ref 0.61–1.24)
GFR, Estimated: 47 mL/min — ABNORMAL LOW (ref >60)
Glucose, Bld: 139 mg/dL — ABNORMAL HIGH (ref 70–99)
Phosphorus: 5.2 mg/dL — ABNORMAL HIGH (ref 2.5–4.6)
Potassium: 4.5 mmol/L (ref 3.5–5.1)
Sodium: 145 mmol/L (ref 135–145)

## 2021-01-24 MED ORDER — ACETAMINOPHEN 650 MG RE SUPP
650.0000 mg | RECTAL | Status: DC | PRN
  Administered 2021-01-24 – 2021-01-25 (×2): 650 mg via RECTAL
  Filled 2021-01-24 (×2): qty 1

## 2021-01-24 NOTE — Progress Notes (Signed)
Patient Demographics:    Devin [name], is a 75 y.o. male, DOB - March 13, 1946, JJK:[id number]  Admit date - 01/12/2021   Admitting Physician Nichele Slawson Denton Brick, MD  Outpatient Primary MD for the patient is [name], MD  LOS - 5   Chief Complaint  Patient presents with   Altered Mental Status        Subjective:    Devin [name] today has  no emesis,    Wife , son,and granddaughter [name] at bedside -Patient has persistent fevers -Patient is very lethargic -Respiratory status remains very tenuous -Desaturates very quickly with attempt to take him off BiPAP -Tachycardia and tachypnea persist -Poor urine output -Quite lethargic , does not respond to verbal or tactile stimuli, some response to noxious stimuli Extensive conversation with family at bedside throughout the day--they are aware that prognosis is poor  Assessment  & Plan :    Principal Problem:   Severe sepsis with acute organ dysfunction due to Rt Sided PNA Active Problems:   CAP (community acquired pneumonia)   Acute HFrEF (heart failure with reduced ejection fraction) --EF 20 %   NSTEMI (non-ST elevated myocardial infarction) Type 2 Due to Sepsis   Acute respiratory failure with hypoxia due to CHF and pneumonia   PAD (peripheral artery disease) (HCC)   Hyperlipidemia   Hypertension   Hypernatremia   Hypercalcemia   Dehydration   Hypokalemia   Acute metabolic encephalopathy   Thrombocytopenia ([place])  Brief Summary:-  75 y.o. male, with history of coronary artery disease, GERD, hypertension, stenosis of carotid artery, COPD, TIA admitted on 01/02/2021 with severe sepsis secondary to community-acquired pneumonia and hypoxia and persistent thrombocytopenia  A/p  1) severe sepsis with acute hypoxic respiratory failure i secondary to community-acquired pneumonia (RUL)-- Patient met sepsis criteria on admission with tachycardia and  tachypnea and finding of right upper lobe pneumonia -01/24/21 -Patient with persistent fever and significant lethargy -Respiratory status remains very tenuous -Desaturates very quickly with attempt to take him off BiPAP -Significantly increased work of breathing and persistent tachypnea -HIV negative -Chest x-ray from 01/21/2021 and repeat on 01/22/21 and 01/23/21 and 01/24/21 showed worsening bilateral pneumonia right lung and left  -CT abdomen and pelvis with right-sided pneumonia otherwise no acute intra-abdominal findings -Continue bronchodilators and mucolytics -Strep pneumo antigen negative, Legionella pending -Given worsening pneumonia stop Rocephin and azithromycin and start on Vanco and cefepime instead as of 01/23/2021 Discussed with pulmonary critical care attending Dr. Halford Chessman previously --Quite lethargic , does not respond to verbal or tactile stimuli, some response to noxious stimuli Extensive conversation with family at bedside throughout the day--they are aware that prognosis is poor  2) severe thrombocytopenia--- discussed with Dr. Delton Coombes -Patient with longstanding history of thrombocytopenia platelets usually around 100 K -Platelets 30 >> 26 >>30>>29 k>>31k>>60 -Pathology smear reviewed  -LDH elevated at 666>>956 -Serum copper is low at 30 -B12 is not low, -Folate is normal -PTT is 34, INR is 1.3, Fibrinogen is low normal L at 219 -We will transfuse if platelet count less than 10 K or concerns for bleeding -Stop Pletal -Per Dr. Delton Coombes okay to give IVIG for possible sepsis induced thrombocytopenia Versus ITP -Continue steroids  3) chronic anemia-with normal MCV MCH and RDW -Patient hemoglobin usually between 10  and 11, on admission hemoglobin was 13.8 most likely due to dehydration,  -Hemoglobin is stable -No obvious bleeding noted  4) HFrEF/NSTEMI--- suspect type II NSTEMI due to demand ischemia in the setting of sepsis and pneumonia with acute respiratory failure  and hypoxia--compounded by acute systolic dysfunction CHF exacerbation -No chest pain Troponin--- 41>>535>>786>>740>>725>>839 BNP 1,138 -Discussed with Dr Harl Bowie Pt is -Not a candidate for IV heparin or antiplatelet agent and Not a candidate for LHC due to severe , persistent thrombocytopenia -Continue IV metoprolol Stop IV Lasix due to AKI - 5)AKI--creatinine is up to 1.55 from a baseline around 0.9, suspect this is due to IV Lasix/diuresis as above #4 -Discontinue IV Lasix -Watch renal function closely as patient is on vancomycin renally adjust medications, avoid nephrotoxic agents / dehydration  / hypotension  6) acute metabolic encephalopathy---secondary to #1 above, and #8 below -CT head negative for acute findings -MRI brain without acute finding -Suspect related to infection and HF -Patient becoming more more lethargic  6)HTN-- iv metoprolol as ordered  7)PAD--s/p bilateral femoropopliteal bypass, with chronic occlusion of right graft  -Pletal discontinued due to severe thrombocytopenia  8) hypernatremia and hypercalcemia/hypokalemia--may be dehydration related, recheck after further hydration -=-Sodium normalized with hydration Be judicious with IV fluid due to heart failure  9) acute COPD exacerbation--- secondary to #1 above, manage as above #1  10)Social/ethics--- discussed with patient's wife and grand-daughter- she request DNR/DNI status ,however request full scope of treatment without limitations 01/24/21 Patient has persistent fevers -Patient is very lethargic -Respiratory status remains very tenuous -Desaturates very quickly with attempt to take him off BiPAP -Tachycardia and tachypnea persist -Poor urine output --Overall prognosis is Not encouraging  11)Disposition/Need for in-Hospital Stay- patient unable to be discharged at this time due to severe sepsis secondary to pneumonia requiring IV antibiotics and NSTEMI with heart failure requiring further support  with BiPAP Status is: Inpatient  Remains inpatient appropriate because: Please see disposition above  Disposition: The patient is from: Home              Anticipated d/c is to: SNF              Anticipated d/c date is: > 3 days              Patient currently is not medically stable to d/c. Barriers: Not Clinically Stable-   Code Status :  -  Code Status: DNR   Family Communication:    (patient is alert, awake and coherent)  Discussed with wife at [phone]----and in Person at bedside Discussed with granddaughter [name] and daugther in law Consults  : Dr. Delton Coombes -Curbside consult with Dr. Halford Chessman from pulmonary critical care service  DVT Prophylaxis  :   - SCDs *  SCDs Start: 01/01/2021 2246  Lab Results  Component Value Date   PLT 60 (L) 01/24/2021    Inpatient Medications  Scheduled Meds:  amLODipine  10 mg Oral Daily   atorvastatin  20 mg Oral Daily   Chlorhexidine Gluconate Cloth  6 each Topical Daily   ferrous sulfate  325 mg Oral Q breakfast   ipratropium  0.5 mg Nebulization Q6H   levalbuterol  1.25 mg Nebulization Q6H   methylPREDNISolone (SOLU-MEDROL) injection  40 mg Intravenous Q12H   metoprolol tartrate  5 mg Intravenous Q6H   mometasone-formoterol  2 puff Inhalation BID   vitamin B-12  1,000 mcg Oral Daily   Continuous Infusions:  ceFEPime (MAXIPIME) IV 2 g (01/24/21 0941)   dextrose  5 % and 0.45% NaCl 50 mL/hr at 01/24/21 1402   vancomycin 500 mg (01/24/21 0954)   PRN Meds:.acetaminophen, hydrALAZINE, LORazepam    Anti-infectives (From admission, onward)    Start     Dose/Rate Route Frequency Ordered Stop   01/24/21 0900  vancomycin (VANCOREADY) IVPB 500 mg/100 mL        500 mg 100 mL/hr over 60 Minutes Intravenous Every 12 hours 01/23/21 1824     01/23/21 1915  ceFEPIme (MAXIPIME) 2 g in sodium chloride 0.9 % 100 mL IVPB        2 g 200 mL/hr over 30 Minutes Intravenous Every 12 hours 01/23/21 1824     01/23/21 1915  vancomycin (VANCOREADY)  IVPB 1250 mg/250 mL        1,250 mg 166.7 mL/hr over 90 Minutes Intravenous  Once 01/23/21 1824 01/24/21 0944   01/21/21 2000  cefTRIAXone (ROCEPHIN) 2 g in sodium chloride 0.9 % 100 mL IVPB  Status:  Discontinued        2 g 200 mL/hr over 30 Minutes Intravenous Every 24 hours 01/21/21 1750 01/23/21 1742   01/20/21 2000  cefTRIAXone (ROCEPHIN) 1 g in sodium chloride 0.9 % 100 mL IVPB  Status:  Discontinued        1 g 200 mL/hr over 30 Minutes Intravenous Every 24 hours 01/15/2021 2245 01/21/21 1750   01/18/2021 2100  azithromycin (ZITHROMAX) 500 mg in sodium chloride 0.9 % 250 mL IVPB  Status:  Discontinued        500 mg 250 mL/hr over 60 Minutes Intravenous Every 24 hours 01/23/2021 2056 01/23/21 1742   01/08/2021 1900  cefTRIAXone (ROCEPHIN) 1 g in sodium chloride 0.9 % 100 mL IVPB        1 g 200 mL/hr over 30 Minutes Intravenous  Once 01/09/2021 1851 12/31/2020 1949         Objective:   Vitals:   01/24/21 1600 01/24/21 1606 01/24/21 1700 01/24/21 1800  BP: 135/80  138/79 131/66  Pulse: (!) 114 (!) 113 (!) 117 (!) 101  Resp: (!) 41 (!) 40 (!) 38 (!) 35  Temp:  (!) 101.6 F (38.7 C)  (!) 103.7 F (39.8 C)  TempSrc:  Axillary  Rectal  SpO2: 95% 95% 93% 95%  Weight:      Height:        Wt Readings from Last 3 Encounters:  01/21/21 67.1 kg  04/14/20 75.4 kg  12/23/19 73.4 kg     Intake/Output Summary (Last 24 hours) at 01/24/2021 1837 Last data filed at 01/24/2021 1600 Gross per 24 hour  Intake 827.53 ml  Output 725 ml  Net 102.53 ml   Physical Exam  Gen:-Quite lethargic , does not respond to verbal or tactile stimuli, some response to noxious stimuli HEENT:- [place].AT, No sclera icterus Nose-  BiPAP Neck-Supple Neck,No JVD,.  Lungs-diminished air movement, no rhonchi or wheezing, tachypnea noted CV- S1, S2 normal, regular , tachycardic Abd-  +ve B.Sounds, Abd Soft, No tenderness,    Extremity/Skin:- No  edema, pedal pulses present , no significant ecchymosis, no petechia no  bleeding concerns on mucosal or skin NeuroPsych--very sleepy, no longer agitated or restless, not really responsive at this time   Data Review:   Micro Results Recent Results (from the past 240 hour(s))  Blood culture (routine x 2)     Status: None   Collection Time: 01/12/2021  6:35 PM   Specimen: BLOOD RIGHT HAND  Result Value Ref Range Status   Specimen  Description BLOOD RIGHT HAND  Final   Special Requests   Final    BOTTLES DRAWN AEROBIC AND ANAEROBIC Blood Culture adequate volume   Culture   Final    NO GROWTH 5 DAYS Performed at [facility], [address], [place], [place] [address]    Report Status 01/24/2021 FINAL  Final  Blood culture (routine x 2)     Status: None   Collection Time: 01/15/2021  6:35 PM   Specimen: BLOOD LEFT HAND  Result Value Ref Range Status   Specimen Description BLOOD LEFT HAND  Final   Special Requests   Final    BOTTLES DRAWN AEROBIC AND ANAEROBIC Blood Culture adequate volume   Culture   Final    NO GROWTH 5 DAYS Performed at [facility], [address] [place], [place] [address]    Report Status 01/24/2021 FINAL  Final  Resp Panel by RT-PCR (Flu A&B, Covid) Nasopharyngeal Swab     Status: None   Collection Time: 12/29/2020  6:37 PM   Specimen: Nasopharyngeal Swab; Nasopharyngeal(NP) swabs in vial transport medium  Result Value Ref Range Status   SARS Coronavirus 2 by RT PCR NEGATIVE NEGATIVE Final    Comment: (NOTE) SARS-CoV-2 target nucleic acids are NOT DETECTED.  The SARS-CoV-2 RNA is generally detectable in upper respiratory specimens during the acute phase of infection. The lowest concentration of SARS-CoV-2 viral copies this assay can detect is 138 copies/mL. A negative result does not preclude SARS-Cov-2 infection and should not be used as the sole basis for treatment or other patient management decisions. A negative result may occur with  improper specimen collection/handling, submission of specimen other than nasopharyngeal swab,  presence of viral mutation(s) within the areas targeted by this assay, and inadequate number of viral copies(<138 copies/mL). A negative result must be combined with clinical observations, patient history, and epidemiological information. The expected result is Negative.  Fact Sheet for Patients:  EntrepreneurPulse.com.au  Fact Sheet for Healthcare Providers:  IncredibleEmployment.be  This test is no t yet approved or cleared by the [place] FDA and  has been authorized for detection and/or diagnosis of SARS-CoV-2 by FDA under an Emergency Use Authorization (EUA). This EUA will remain  in effect (meaning this test can be used) for the duration of the COVID-19 declaration under Section 564(b)(1) of the Act, 21 U.S.C.section 360bbb-3(b)(1), unless the authorization is terminated  or revoked sooner.       Influenza A by PCR NEGATIVE NEGATIVE Final   Influenza B by PCR NEGATIVE NEGATIVE Final    Comment: (NOTE) The Xpert Xpress SARS-CoV-2/FLU/RSV plus assay is intended as an aid in the diagnosis of influenza from Nasopharyngeal swab specimens and should not be used as a sole basis for treatment. Nasal washings and aspirates are unacceptable for Xpert Xpress SARS-CoV-2/FLU/RSV testing.  Fact Sheet for Patients: EntrepreneurPulse.com.au  Fact Sheet for Healthcare Providers: IncredibleEmployment.be  This test is not yet approved or cleared by the [place] FDA and has been authorized for detection and/or diagnosis of SARS-CoV-2 by FDA under an Emergency Use Authorization (EUA). This EUA will remain in effect (meaning this test can be used) for the duration of the COVID-19 declaration under Section 564(b)(1) of the Act, 21 U.S.C. section 360bbb-3(b)(1), unless the authorization is terminated or revoked.  Performed at [facility], [address], [place], [place] [address]   MRSA Next Gen by PCR,  Nasal     Status: None   Collection Time: 01/21/21  9:13 PM   Specimen:  Nasal Mucosa; Nasal Swab  Result Value Ref Range Status   MRSA by PCR Next Gen NOT DETECTED NOT DETECTED Final    Comment: (NOTE) The GeneXpert MRSA Assay (FDA approved for NASAL specimens only), is one component of a comprehensive MRSA colonization surveillance program. It is not intended to diagnose MRSA infection nor to guide or monitor treatment for MRSA infections. Test performance is not FDA approved in patients less than 51 years old. Performed at [facility], [address] [place], [place] [address]   Urine Culture     Status: None   Collection Time: 01/22/21  5:42 AM   Specimen: Urine, Clean Catch  Result Value Ref Range Status   Specimen Description   Final    URINE, CLEAN CATCH Performed at [facility], [address] [place], [place] [address]    Special Requests   Final    NONE Performed at [facility], [address], [place], [place] [address]    Culture   Final    NO GROWTH Performed at [facility], [place] [address] [place], [place] [address]    Report Status 01/24/2021 FINAL  Final  Culture, blood (Routine X 2) w Reflex to ID Panel     Status: None (Preliminary result)   Collection Time: 01/23/21  8:02 PM   Specimen: BLOOD RIGHT HAND  Result Value Ref Range Status   Specimen Description BLOOD RIGHT HAND  Final   Special Requests   Final    BOTTLES DRAWN AEROBIC AND ANAEROBIC Blood Culture adequate volume   Culture   Final    NO GROWTH < 12 HOURS Performed at [facility], [address], [place], [place] [address]    Report Status PENDING  Incomplete  Culture, blood (Routine X 2) w Reflex to ID Panel     Status: None (Preliminary result)   Collection Time: 01/23/21  8:02 PM   Specimen: BLOOD LEFT HAND  Result Value Ref Range Status   Specimen Description BLOOD LEFT HAND  Final   Special Requests   Final    BOTTLES DRAWN AEROBIC AND ANAEROBIC Blood Culture adequate volume    Culture   Final    NO GROWTH < 12 HOURS Performed at [facility], [address] [place], [place] [address]    Report Status PENDING  Incomplete    Radiology Reports CT HEAD WO CONTRAST (5MM)  Result Date: 01/01/2021 CLINICAL DATA:  Mental status changes EXAM: CT HEAD WITHOUT CONTRAST TECHNIQUE: Contiguous axial images were obtained from the base of the skull through the vertex without intravenous contrast. COMPARISON:  11/27/2001 FINDINGS: Brain: There is atrophy and chronic small vessel disease changes. No acute intracranial abnormality. Specifically, no hemorrhage, hydrocephalus, mass lesion, acute infarction, or significant intracranial injury. Vascular: No hyperdense vessel or unexpected calcification. Skull: No acute calvarial abnormality. Sinuses/Orbits: No acute findings Other: None IMPRESSION: Atrophy, chronic microvascular disease. No acute intracranial abnormality. Electronically Signed   By: Rolm Baptise M.D.   On: 01/03/2021 18:58   MR BRAIN WO CONTRAST  Result Date: 01/21/2021 CLINICAL DATA:  Mental status change, unknown cause. EXAM: MRI HEAD WITHOUT CONTRAST TECHNIQUE: Multiplanar, multiecho pulse sequences of the brain and surrounding structures were obtained without intravenous contrast. COMPARISON:  Head CT January 19, 2021 FINDINGS: The study is partially degraded by motion. Brain: No acute infarction, hemorrhage, hydrocephalus, extra-axial collection or mass lesion. Scattered and confluent foci of T2 hyperintensity are seen within the white matter of the cerebral hemispheres nonspecific, most likely related  to chronic small vessel ischemia. Moderate parenchymal volume loss. Vascular: Normal flow voids. Skull and upper cervical spine: Negative. Sinuses/Orbits: Bilateral lens surgery. Paranasal sinuses are clear. Other: None. IMPRESSION: 1. Study partially limited by motion. 2. No acute intracranial abnormality. 3. Moderate chronic ischemic changes of the white matter and parenchymal  volume. Electronically Signed   By: Pedro Earls M.D.   On: 01/21/2021 13:17   CT ABDOMEN PELVIS W CONTRAST  Result Date: 01/21/2021 CLINICAL DATA:  Acute abdominal pain, nonlocalized. Generalized abdominal pain, weakness, anemia, sepsis secondary to community-acquired pneumonia. EXAM: CT ABDOMEN AND PELVIS WITH CONTRAST TECHNIQUE: Multidetector CT imaging of the abdomen and pelvis was performed using the standard protocol following bolus administration of intravenous contrast. CONTRAST:  73m OMNIPAQUE IOHEXOL 350 MG/ML SOLN COMPARISON:  Chest radiograph 01/21/2021 FINDINGS: Lower chest: Airspace consolidation in the right lower lung consistent with pneumonia. Cardiac enlargement. Postoperative changes in the mediastinum. Hepatobiliary: Mild diffuse fatty infiltration of the liver. Poorly defined focal low-attenuation lesion in segment 7 of the liver measuring 1.2 cm diameter. There appears to be vague peripheral enhancement. While this may represent a hemangioma, the CT appearance is indeterminate. Consider further characterization with MRI in the nonemergent setting as clinically indicated. Cholelithiasis with small stone in the dependent gallbladder. No inflammatory changes. Bile ducts are not dilated. Pancreas: Unremarkable. No pancreatic ductal dilatation or surrounding inflammatory changes. Spleen: Normal in size without focal abnormality. Adrenals/Urinary Tract: Adrenal glands are unremarkable. Kidneys are normal, without renal calculi, focal lesion, or hydronephrosis. Bladder is unremarkable. Stomach/Bowel: Stomach, small bowel, and colon are not abnormally distended. Stool throughout the colon. A patent medication also demonstrated in the colon. No wall thickening or inflammatory changes appreciated. Appendix is not identified. Vascular/Lymphatic: Diffuse prominent calcification of the aorta and branch vessels. No aneurysm. Acute change in diameter of the distal common femoral artery  on the right with absence of flow distally suggesting thrombosis or occlusion. Suggestion of a femoral bypass graft which is also occluded. There is no retroperitoneal lymphadenopathy. There is evidence of lymphadenopathy in the porta hepatis, with enlarged lymph nodes in the hepatic hilum measuring 1.8 cm short axis dimension. Reproductive: Prostate is unremarkable. Other: No free air or free fluid in the abdomen. Abdominal wall musculature appears intact. Musculoskeletal: Degenerative changes in the spine. Old right rib fractures. IMPRESSION: 1. Airspace consolidation in the right lower lung consistent with pneumonia. 2. Indeterminate lesion in segment 7 of the liver. Consider further characterization with nonemergent MRI if clinically indicated. 3. Lymphadenopathy in the hepatic hilum with multiple lymph nodes measuring up to 1.8 cm short axis dimension. Nonspecific etiology. Malignant/metastatic versus reactive. 4. Absence of flow demonstrated in the distal common femoral artery extending into the superficial femoral artery on the right. Possible thrombosed bypass graft, incompletely visualized. Electronically Signed   By: WLucienne CapersM.D.   On: 01/21/2021 21:14   DG CHEST PORT 1 VIEW  Result Date: 01/24/2021 CLINICAL DATA:  Dyspnea. EXAM: PORTABLE CHEST 1 VIEW COMPARISON:  January 23, 2021. FINDINGS: The heart size and mediastinal contours are within normal limits. Status post coronary bypass graft. Stable bilateral lung opacities are noted, right greater than left, concerning for multifocal pneumonia. The visualized skeletal structures are unremarkable. IMPRESSION: Stable bilateral lung opacities are noted concerning for multifocal pneumonia. Aortic Atherosclerosis (ICD10-I70.0). Electronically Signed   By: JMarijo ConceptionM.D.   On: 01/24/2021 13:34   DG Chest Port 1 View  Result Date: 01/23/2021 CLINICAL DATA:  Shortness of breath, right-sided  pneumonia EXAM: PORTABLE CHEST 1 VIEW COMPARISON:   01/22/2021 FINDINGS: Multifocal patchy opacities, right upper lobe predominant, progressive. No pleural effusion or pneumothorax. The heart is top-normal in size. Postsurgical changes related to prior CABG. Thoracic aortic atherosclerosis. Median sternotomy. IMPRESSION: Multifocal patchy opacities, right upper lobe predominant, progressive. This appearance is compatible with worsening pneumonia. Electronically Signed   By: Julian Hy M.D.   On: 01/23/2021 03:56   DG CHEST PORT 1 VIEW  Result Date: 01/22/2021 CLINICAL DATA:  Pneumonia EXAM: PORTABLE CHEST 1 VIEW COMPARISON:  01/21/2021 FINDINGS: No significant change in AP portable chest radiograph with diffuse heterogeneous and interstitial airspace opacity, somewhat coarse appearing, and cardiomegaly status post median sternotomy. IMPRESSION: 1. No significant change in AP portable chest radiograph with diffuse heterogeneous and interstitial airspace opacity, somewhat coarse appearing, this may reflect a combination of acute airspace disease and chronic underlying interstitial change. This could be further assessed by CT if desired. 2.  Cardiomegaly status post median sternotomy. Electronically Signed   By: Eddie Candle M.D.   On: 01/22/2021 07:59   DG CHEST PORT 1 VIEW  Result Date: 01/21/2021 CLINICAL DATA:  Altered level of consciousness, short of breath EXAM: PORTABLE CHEST 1 VIEW COMPARISON:  01/15/2021 FINDINGS: Single frontal view of the chest demonstrates a stable cardiac silhouette. There is chronic background interstitial scarring, with superimposed right perihilar airspace disease that has progressed since prior study. No effusion or pneumothorax. No acute bony abnormalities. IMPRESSION: 1. Increasing right perihilar airspace disease, consistent with acute pneumonia superimposed upon chronic background lung scarring. Electronically Signed   By: Randa Ngo M.D.   On: 01/21/2021 17:34   DG Chest Port 1 View  Result Date:  01/08/2021 CLINICAL DATA:  Cough. EXAM: PORTABLE CHEST 1 VIEW COMPARISON:  Chest x-ray dated July 14, 2018. FINDINGS: Stable cardiomediastinal silhouette status post CABG. Patchy opacities in the right upper lobe. No pleural effusion or pneumothorax. No acute osseous abnormality. IMPRESSION: 1. Right upper lobe pneumonia. Electronically Signed   By: Titus Dubin M.D.   On: 01/17/2021 18:10   ECHOCARDIOGRAM COMPLETE  Result Date: 01/22/2021    ECHOCARDIOGRAM REPORT   Patient Name:   AARIC DOLPH Date of Exam: 01/22/2021 Medical Rec #:  [id number]          Height:       69.0 in Accession #:    [id number]         Weight:       147.9 lb Date of Birth:  1945-11-08           BSA:          1.817 m Patient Age:    89 years           BP:           146/81 mmHg Patient Gender: M                  HR:           109 bpm. Exam Location:  [facility] Procedure: 2D Echo, Cardiac Doppler and Color Doppler Indications:    Dyspnea  History:        Patient has prior history of Echocardiogram examinations, most                 recent 04/30/2018. CAD, Prior CABG, TIA, Arrythmias:LBBB; Risk                 Factors:Hypertension and Dyslipidemia.  Sonographer:    Wenda Low  Referring Phys: [id number] Karna Abed IMPRESSIONS  1. The anteroseptal, anterior, antoerlateral walls are akinetic. The entire apex is akinetic. Mid to distal inferior wall is akinetic. Marland Kitchen Left ventricular ejection fraction, by estimation, is 20%. The left ventricle has severely decreased function. The left ventricle demonstrates regional wall motion abnormalities (see scoring diagram/findings for description). Left ventricular diastolic parameters are indeterminate.  2. Right ventricular systolic function is normal. The right ventricular size is normal. There is moderately elevated pulmonary artery systolic pressure.  3. The mitral valve is normal in structure. Mild mitral valve regurgitation. No evidence of mitral stenosis.  4. The aortic valve is  tricuspid. There is mild calcification of the aortic valve. There is mild thickening of the aortic valve. Aortic valve regurgitation is not visualized. No aortic stenosis is present.  5. The inferior vena cava is dilated in size with >50% respiratory variability, suggesting right atrial pressure of 8 mmHg. FINDINGS  Left Ventricle: The anteroseptal, anterior, antoerlateral walls are akinetic. The entire apex is akinetic. Mid to distal inferior wall is akinetic. Left ventricular ejection fraction, by estimation, is 20%. The left ventricle has severely decreased function. The left ventricle demonstrates regional wall motion abnormalities. The left ventricular internal cavity size was normal in size. There is no left ventricular hypertrophy. Left ventricular diastolic parameters are indeterminate. Right Ventricle: The right ventricular size is normal. Right vetricular wall thickness was not assessed. Right ventricular systolic function is normal. There is moderately elevated pulmonary artery systolic pressure. The tricuspid regurgitant velocity is  3.54 m/s, and with an assumed right atrial pressure of 8 mmHg, the estimated right ventricular systolic pressure is 04.5 mmHg. Left Atrium: Left atrial size was normal in size. Right Atrium: Right atrial size was normal in size. Pericardium: There is no evidence of pericardial effusion. Mitral Valve: The mitral valve is normal in structure. Mild mitral valve regurgitation. No evidence of mitral valve stenosis. MV peak gradient, 2.5 mmHg. The mean mitral valve gradient is 1.0 mmHg. Tricuspid Valve: The tricuspid valve is not well visualized. Tricuspid valve regurgitation is mild . No evidence of tricuspid stenosis. Aortic Valve: The aortic valve is tricuspid. There is mild calcification of the aortic valve. There is mild thickening of the aortic valve. There is mild aortic valve annular calcification. Aortic valve regurgitation is not visualized. No aortic stenosis  is  present. Aortic valve mean gradient measures 2.0 mmHg. Aortic valve peak gradient measures 4.0 mmHg. Aortic valve area, by VTI measures 1.80 cm. Pulmonic Valve: The pulmonic valve was not well visualized. Pulmonic valve regurgitation is not visualized. No evidence of pulmonic stenosis. Aorta: The aortic root is normal in size and structure. Venous: The inferior vena cava is dilated in size with greater than 50% respiratory variability, suggesting right atrial pressure of 8 mmHg. IAS/Shunts: No atrial level shunt detected by color flow Doppler.  LEFT VENTRICLE PLAX 2D LVIDd:         5.44 cm      Diastology LVIDs:         4.45 cm      LV e' medial:    3.81 cm/s LV PW:         1.03 cm      LV E/e' medial:  13.3 LV IVS:        1.05 cm      LV e' lateral:   6.64 cm/s LVOT diam:     2.00 cm      LV E/e' lateral: 7.6 LV SV:  29 LV SV Index:   16 LVOT Area:     3.14 cm  LV Volumes (MOD) LV vol d, MOD A2C: 84.6 ml LV vol d, MOD A4C: 106.0 ml LV vol s, MOD A2C: 60.0 ml LV vol s, MOD A4C: 74.1 ml LV SV MOD A2C:     24.6 ml LV SV MOD A4C:     106.0 ml LV SV MOD BP:      27.2 ml RIGHT VENTRICLE RV Basal diam:  3.36 cm RV Mid diam:    2.73 cm RV S prime:     8.33 cm/s TAPSE (M-mode): 2.2 cm LEFT ATRIUM             Index       RIGHT ATRIUM           Index LA diam:        4.80 cm 2.64 cm/m  RA Area:     14.20 cm LA Vol (A2C):   45.1 ml 24.81 ml/m RA Volume:   31.10 ml  17.11 ml/m LA Vol (A4C):   57.3 ml 31.53 ml/m LA Biplane Vol: 54.0 ml 29.71 ml/m  AORTIC VALVE AV Area (Vmax):    1.99 cm AV Area (Vmean):   1.72 cm AV Area (VTI):     1.80 cm AV Vmax:           99.70 cm/s AV Vmean:          63.100 cm/s AV VTI:            0.162 m AV Peak Grad:      4.0 mmHg AV Mean Grad:      2.0 mmHg LVOT Vmax:         63.30 cm/s LVOT Vmean:        34.600 cm/s LVOT VTI:          0.093 m LVOT/AV VTI ratio: 0.57  AORTA Ao Root diam: 3.30 cm Ao Asc diam:  3.70 cm MITRAL VALVE               TRICUSPID VALVE MV Area (PHT): 4.21 cm    TR  Peak grad:   50.1 mmHg MV Area VTI:   1.84 cm    TR Vmax:        354.00 cm/s MV Peak grad:  2.5 mmHg MV Mean grad:  1.0 mmHg    SHUNTS MV Vmax:       0.80 m/s    Systemic VTI:  0.09 m MV Vmean:      52.4 cm/s   Systemic Diam: 2.00 cm MV Decel Time: 180 msec MV E velocity: 50.60 cm/s MV A velocity: 58.30 cm/s MV E/A ratio:  0.87 Carlyle Dolly MD Electronically signed by Carlyle Dolly MD Signature Date/Time: 01/22/2021/3:52:57 PM    Final      CBC Recent Labs  Lab 01/20/2021 1728 01/20/21 0445 01/21/21 0550 01/22/21 0244 01/23/21 0408 01/24/21 1103  WBC 7.2 5.4 10.7* 9.0 11.5* 11.8*  HGB 13.8 11.5* 12.1* 11.3* 11.7* 12.2*  HCT 40.7 35.3* 37.7* 34.2* 36.8* 39.6  PLT 30* 26* 30* 29* 31* 60*  MCV 95.8 97.5 95.9 95.8 97.6 101.8*  MCH 32.5 31.8 30.8 31.7 31.0 31.4  MCHC 33.9 32.6 32.1 33.0 31.8 30.8  RDW 14.0 13.7 13.4 13.7 14.1 14.3  LYMPHSABS 1.5  --  1.1  --   --   --   MONOABS 0.9  --  0.9  --   --   --   EOSABS  0.1  --  0.1  --   --   --   BASOSABS 0.1  --  0.0  --   --   --     Chemistries  Recent Labs  Lab 01/06/2021 1728 01/20/21 0008 01/20/21 0445 01/21/21 0550 01/22/21 0244 01/22/21 0444 01/23/21 0408 01/24/21 1103  NA 149*   < > 148* 147* 138  --  137 145  K 3.3*   < > 3.4* 3.2* 3.5  --  4.3 4.5  CL 113*   < > 113* 110 109  --  109 112*  CO2 25   < > _0 --  21* 26  GLUCOSE 112*   < > 134* 120* 143*  --  126* 139*  BUN 34*   < > 30* 28* 27*  --  39* 76*  CREATININE 1.09   < > 0.88 0.89 0.93  --  1.11 1.55*  CALCIUM 11.9*   < > 10.8* 10.8* 9.3  9.9  --  9.6 10.0  MG  --   --   --   --  1.7  --   --   --   AST 42*  --  38 49*  --  48* 107*  --   ALT 30  --  29 38  --  35 38  --   ALKPHOS 166*  --  138* 140*  --  112 163*  --   BILITOT 1.5*  --  1.2 1.0  --  0.5 0.8  --    < > = values in this interval not displayed.   ------------------------------------------------------------------------------------------------------------------ No results for input(s):  CHOL, HDL, LDLCALC, TRIG, CHOLHDL, LDLDIRECT in the last 72 hours.  No results found for: HGBA1C ------------------------------------------------------------------------------------------------------------------ Recent Labs    01/22/21 0244  TSH 1.416   ------------------------------------------------------------------------------------------------------------------ Recent Labs    01/22/21 0444  RETICCTPCT 1.6    Coagulation profile Recent Labs  Lab 12/28/2020 1728  INR 1.3*    No results for input(s): DDIMER in the last 72 hours.  Cardiac Enzymes No results for input(s): CKMB, TROPONINI, MYOGLOBIN in the last 168 hours.  Invalid input(s): CK ------------------------------------------------------------------------------------------------------------------    Component Value Date/Time   BNP 1,138.0 (H) 01/23/2021 7209     Roxan Hockey M.D on 01/24/2021 at 6:37 PM  Go to [email or web address] - for contact info  [facility] - Office  [phone]

## 2021-01-25 ENCOUNTER — Inpatient Hospital Stay (HOSPITAL_COMMUNITY): Payer: Medicare Other

## 2021-01-25 ENCOUNTER — Encounter (HOSPITAL_COMMUNITY): Payer: Self-pay | Admitting: Family Medicine

## 2021-01-25 DIAGNOSIS — J9601 Acute respiratory failure with hypoxia: Secondary | ICD-10-CM

## 2021-01-25 DIAGNOSIS — R6520 Severe sepsis without septic shock: Secondary | ICD-10-CM

## 2021-01-25 DIAGNOSIS — Z7189 Other specified counseling: Secondary | ICD-10-CM

## 2021-01-25 DIAGNOSIS — Z515 Encounter for palliative care: Secondary | ICD-10-CM

## 2021-01-25 DIAGNOSIS — A419 Sepsis, unspecified organism: Principal | ICD-10-CM

## 2021-01-25 LAB — RENAL FUNCTION PANEL
Albumin: 2.6 g/dL — ABNORMAL LOW (ref 3.5–5.0)
Anion gap: 8 (ref 5–15)
BUN: 84 mg/dL — ABNORMAL HIGH (ref 8–23)
CO2: 27 mmol/L (ref 22–32)
Calcium: 10 mg/dL (ref 8.9–10.3)
Chloride: 115 mmol/L — ABNORMAL HIGH (ref 98–111)
Creatinine, Ser: 1.41 mg/dL — ABNORMAL HIGH (ref 0.61–1.24)
GFR, Estimated: 52 mL/min — ABNORMAL LOW (ref >60)
Glucose, Bld: 167 mg/dL — ABNORMAL HIGH (ref 70–99)
Phosphorus: 5 mg/dL — ABNORMAL HIGH (ref 2.5–4.6)
Potassium: 5.1 mmol/L (ref 3.5–5.1)
Sodium: 150 mmol/L — ABNORMAL HIGH (ref 135–145)

## 2021-01-25 LAB — KAPPA/LAMBDA LIGHT CHAINS
Kappa free light chain: 29.7 mg/L — ABNORMAL HIGH (ref 3.3–19.4)
Kappa, lambda light chain ratio: 1.55 (ref 0.26–1.65)
Lambda free light chains: 19.2 mg/L (ref 5.7–26.3)

## 2021-01-25 LAB — CBC
HCT: 44.2 % (ref 39.0–52.0)
Hemoglobin: 12.8 g/dL — ABNORMAL LOW (ref 13.0–17.0)
MCH: 30.8 pg (ref 26.0–34.0)
MCHC: 29 g/dL — ABNORMAL LOW (ref 30.0–36.0)
MCV: 106.3 fL — ABNORMAL HIGH (ref 80.0–100.0)
Platelets: 47 10*3/uL — ABNORMAL LOW (ref 150–400)
RBC: 4.16 MIL/uL — ABNORMAL LOW (ref 4.22–5.81)
RDW: 14.3 % (ref 11.5–15.5)
WBC: 13.3 10*3/uL — ABNORMAL HIGH (ref 4.0–10.5)
nRBC: 4.4 % — ABNORMAL HIGH (ref 0.0–0.2)

## 2021-01-25 LAB — GLUCOSE, CAPILLARY
Glucose-Capillary: 168 mg/dL — ABNORMAL HIGH (ref 70–99)
Glucose-Capillary: 154 mg/dL — ABNORMAL HIGH (ref 70–99)
Glucose-Capillary: 138 mg/dL — ABNORMAL HIGH (ref 70–99)
Glucose-Capillary: 170 mg/dL — ABNORMAL HIGH (ref 70–99)
Glucose-Capillary: 157 mg/dL — ABNORMAL HIGH (ref 70–99)
Glucose-Capillary: 147 mg/dL — ABNORMAL HIGH (ref 70–99)

## 2021-01-25 LAB — LEGIONELLA PNEUMOPHILA SEROGP 1 UR AG: L. pneumophila Serogp 1 Ur Ag: NEGATIVE

## 2021-01-25 MED ORDER — LEVALBUTEROL HCL 1.25 MG/0.5ML IN NEBU
1.2500 mg | INHALATION_SOLUTION | Freq: Two times a day (BID) | RESPIRATORY_TRACT | Status: DC
  Administered 2021-01-25: 1.25 mg via RESPIRATORY_TRACT
  Filled 2021-01-25: qty 0.5

## 2021-01-25 MED ORDER — DEXTROSE 5 % IV SOLN: INTRAVENOUS | Status: DC

## 2021-01-25 MED ORDER — IPRATROPIUM BROMIDE 0.02 % IN SOLN
0.5000 mg | Freq: Two times a day (BID) | RESPIRATORY_TRACT | Status: DC
  Administered 2021-01-25: 0.5 mg via RESPIRATORY_TRACT
  Filled 2021-01-25: qty 2.5

## 2021-01-25 NOTE — Progress Notes (Signed)
SLP Cancellation Note  Patient Details Name: MELVON SUKO MRN: [id number] DOB: 02/10/1946   Cancelled treatment:       Reason Eval/Treat Not Completed: Patient not medically ready; Pt on Bi-PAP and currently not alert.   Thank you,  Genene Churn, [place]    [place] 01/25/2021, 3:33 PM

## 2021-01-25 NOTE — Plan of Care (Signed)
  Problem: Education: Goal: Knowledge of General Education information will improve Description: Including pain rating scale, medication(s)/side effects and non-pharmacologic comfort measures Outcome: Progressing   Problem: Clinical Measurements: Goal: Will remain free from infection Outcome: Progressing Goal: Diagnostic test results will improve Outcome: Progressing Goal: Cardiovascular complication will be avoided Outcome: Progressing   Problem: Coping: Goal: Level of anxiety will decrease Outcome: Progressing   Problem: Elimination: Goal: Will not experience complications related to bowel motility Outcome: Progressing Goal: Will not experience complications related to urinary retention Outcome: Progressing   Problem: Pain Managment: Goal: General experience of comfort will improve Outcome: Progressing   Problem: Safety: Goal: Ability to remain free from injury will improve Outcome: Progressing   Problem: Skin Integrity: Goal: Risk for impaired skin integrity will decrease Outcome: Progressing

## 2021-01-25 NOTE — Consult Note (Signed)
Consultation Note Date: 01/25/2021   Patient Name: Devin [name]  DOB: 1946/04/01  MRN: [id number]  Age / Sex: 75 y.o., male  PCP: [name], MD Referring Physician: Roxan Hockey, MD  Reason for Consultation: Establishing goals of care  HPI/Patient Profile: 75 y.o. male  with past medical history of CAD, HTN/HLD, GERD, stenosis of carotid artery, COPD, TIA, OA, CKD, admitted on 01/09/2021 with sepsis with acute hypoxic respiratory failure secondary to right upper lobe community-acquired pneumonia.   Clinical Assessment and Goals of Care: I have reviewed medical records including EPIC notes, labs and imaging, received report from RN, assessed the patient and then met at the bedside along with wife of 19 years, Enid Derry, and son Gwynneth Macleod, to discuss diagnosis prognosis, GOC, EOL wishes, disposition and options.  I introduced Palliative Medicine as specialized medical care for people living with serious illness. It focuses on providing relief from the symptoms and stress of a serious illness. The goal is to improve quality of life for both the patient and the family.  We discussed a brief life review of the patient.  Mr. and Mrs. Ake have been married for 54 years.  They have 2 sons and a daughter.  Son Gwynneth Macleod is present at bedside at this time.  Mr. Vanbenschoten worked in [place] for many years.  We then focused on their current illness.  Mrs. Cominsky is able to accurately name her husband's health concerns, both acute and chronic, the treatment plan, projected timelines.  The natural disease trajectory and expectations at EOL were discussed.  We talked about selected labs, the treatment plan including, but not limited to IV fluids, antibiotics, respiratory support.  Advanced directives, concepts specific to code status, were considered and discussed.  Spouse and son endorsed DNR.  Discussed the importance of  continued conversation with family and the medical providers regarding overall plan of care and treatment options, ensuring decisions are within the context of the patient's values and GOCs.  Questions and concerns were addressed.  The family was encouraged to call with questions or concerns.  PMT will continue to support holistically.  Conference with attending, bedside nursing staff related to patient condition, needs, goals of care.  HCPOA NEXT OF KIN -wife of 51 years, Marshall Kampf.    SUMMARY OF RECOMMENDATIONS   At this point continue to treat the treatable but no CPR or intubation Time for outcomes   Code Status/Advance Care Planning: DNR  Symptom Management:  Per hospitalist, no additional needs at this time.  Palliative Prophylaxis:  Frequent Pain Assessment, Oral Care, and Turn Reposition  Additional Recommendations (Limitations, Scope, Preferences): Continue to treat the treatable, time for outcomes.  No CPR or or intubation.  Psycho-social/Spiritual:  Desire for further Chaplaincy support:no Additional Recommendations: Caregiving  Support/Resources and Education on Hospice  Prognosis:  Unable to determine, guarded at this point.  The next 24 to 48 hours will direct ability to recover  Discharge Planning:  To be determined, based on outcomes.       Primary Diagnoses:  Present on Admission:  CAP (community acquired pneumonia)  Hyperlipidemia  Hypertension  Hypernatremia  Hypercalcemia  Dehydration  Hypokalemia  Acute metabolic encephalopathy  Thrombocytopenia (HCC)  PAD (peripheral artery disease) (HCC)  Acute HFrEF (heart failure with reduced ejection fraction) --EF 20 %  NSTEMI (non-ST elevated myocardial infarction) Type 2 Due to Sepsis  Severe sepsis with acute organ dysfunction due to Rt Sided PNA  Acute respiratory failure with hypoxia due to CHF and pneumonia   I have reviewed the medical record, interviewed the patient and family, and  examined the patient. The following aspects are pertinent.  Past Medical History:  Diagnosis Date   Arthritis    Asthma    Carpal tunnel syndrome    Chest pain, unspecified    Chronic kidney disease    hx of kidney stones   Coronary artery disease    Coronary atherosclerosis of artery bypass graft    GERD (gastroesophageal reflux disease)    History of kidney stones    HOH (hard of hearing)    Hypertension    Occlusion and stenosis of carotid artery without mention of cerebral infarction    Other and unspecified hyperlipidemia    Personal history of unspecified circulatory disease    Shortness of breath    Stroke ([place])    TIA (transient ischemic attack)    Social History   Socioeconomic History   Marital status: Married    Spouse name: Not on file   Number of children: Not on file   Years of education: GED   Highest education level: Not on file  Occupational History   Occupation: fulltime   Tobacco Use   Smoking status: Former    Packs/day: 1.00    Years: 50.00    Pack years: 50.00    Types: Cigarettes, Cigars    Quit date: 06/07/2017    Years since quitting: 3.6   Smokeless tobacco: Never  Vaping Use   Vaping Use: Former  Substance and Sexual Activity   Alcohol use: No    Alcohol/week: 0.0 standard drinks   Drug use: No   Sexual activity: Yes  Other Topics Concern   Not on file  Social History Narrative   Not on file   Social Determinants of Health   Financial Resource Strain: Not on file  Food Insecurity: Not on file  Transportation Needs: Not on file  Physical Activity: Not on file  Stress: Not on file  Social Connections: Not on file   Family History  Problem Relation Age of Onset   Heart disease Other    Arthritis Other    Asthma Other    Coronary artery disease Other    Anesthesia problems Neg Hx    Hypotension Neg Hx    Malignant hyperthermia Neg Hx    Pseudochol deficiency Neg Hx    Scheduled Meds:  amLODipine  10 mg Oral Daily    atorvastatin  20 mg Oral Daily   Chlorhexidine Gluconate Cloth  6 each Topical Daily   ferrous sulfate  325 mg Oral Q breakfast   ipratropium  0.5 mg Nebulization Q6H   levalbuterol  1.25 mg Nebulization Q6H   methylPREDNISolone (SOLU-MEDROL) injection  40 mg Intravenous Q12H   metoprolol tartrate  5 mg Intravenous Q6H   mometasone-formoterol  2 puff Inhalation BID   vitamin B-12  1,000 mcg Oral Daily   Continuous Infusions:  ceFEPime (MAXIPIME) IV 2 g (01/25/21 0932)   dextrose 75 mL/hr at 01/25/21 0930   vancomycin 500  mg (01/25/21 0939)   PRN Meds:.acetaminophen, hydrALAZINE, LORazepam Medications Prior to Admission:  Prior to Admission medications   Medication Sig Start Date End Date Taking? Authorizing Provider  albuterol (PROVENTIL HFA;VENTOLIN HFA) 108 (90 Base) MCG/ACT inhaler Inhale 2 puffs into the lungs every 6 (six) hours as needed for wheezing or shortness of breath.   Yes [provider]  amLODipine (NORVASC) 5 MG tablet TAKE 1 TABLET(5 MG) BY MOUTH DAILY 07/20/20  Yes Sherren Mocha, MD  aspirin 81 MG EC tablet Take 81 mg by mouth daily.     Yes [provider]  atorvastatin (LIPITOR) 40 MG tablet TAKE 1 TABLET(40 MG) BY MOUTH DAILY 07/20/20  Yes Sherren Mocha, MD  calcium carbonate (OSCAL) 1500 (600 Ca) MG TABS tablet Take 600 mg of elemental calcium by mouth daily with breakfast.   Yes [provider]  Cholecalciferol 5000 units TABS Take 5,000 Units by mouth daily.   Yes [provider]  cilostazol (PLETAL) 100 MG tablet Take 1 tablet (100 mg total) by mouth 2 (two) times daily. Please keep upcoming 01/22/21 appointment for further refills 01/18/21  Yes Sherren Mocha, MD  dorzolamide-timolol (COSOPT) 22.3-6.8 MG/ML ophthalmic solution Place 1 drop into the right eye 2 (two) times daily.    Yes [provider]  IRON PO Take 1 tablet by mouth daily.   Yes [provider]  latanoprost (XALATAN) 0.005 % ophthalmic  solution Place 1 drop into both eyes at bedtime.    Yes [provider]  nitroGLYCERIN (NITROSTAT) 0.4 MG SL tablet Place 1 tablet (0.4 mg total) under the tongue every 5 (five) minutes as needed for chest pain. 12/12/19  Yes Sherren Mocha, MD  omeprazole (PRILOSEC OTC) 20 MG tablet Take 20 mg by mouth daily.     Yes [provider]  potassium chloride (MICRO-K) 10 MEQ CR capsule Take 10 mEq by mouth daily. 12/29/20  Yes [provider]  SYMBICORT 160-4.5 MCG/ACT inhaler Inhale 2 puffs into the lungs 2 (two) times daily. 12/29/20  Yes [provider]  vitamin B-12 (CYANOCOBALAMIN) 1000 MCG tablet Take 1,000 mcg by mouth daily.   Yes [provider]  zinc gluconate 50 MG tablet Take 50 mg by mouth daily.   Yes [provider]  acidophilus (RISAQUAD) CAPS capsule Take 1 capsule by mouth daily. Patient not taking: Reported on 01/27/2021    [provider]  SYMBICORT 80-4.5 MCG/ACT inhaler Inhale 2 puffs into the lungs 2 (two) times daily.  Patient not taking: Reported on 12/29/2020 10/17/17   [provider]   Allergies  Allergen Reactions   Aspirin Other (See Comments)    Has to take the coated aspirin   Review of Systems  Unable to perform ROS: Acuity of condition   Physical Exam Vitals and nursing note reviewed.  Constitutional:      General: He is not in acute distress.    Appearance: He is ill-appearing.  Cardiovascular:     Rate and Rhythm: Normal rate.  Pulmonary:     Comments: BiPAP Skin:    General: Skin is warm and dry.  Neurological:     Comments: BiPAP, unable to respond to orientation questions    Vital Signs: BP 133/77   Pulse 88   Temp 99.9 F (37.7 C) (Rectal)   Resp (!) 29   Ht _0  (1.753 m)   Wt 67.1 kg   SpO2 96%   BMI 21.85 kg/m  Pain Scale: CPOT POSS *See Group  Information*: 4-INTERVENTION REQUIRED,Unacceptable,Somnolent, mininal or no response to verbal and physical stimulation Pain  Score: 0-No pain   SpO2: SpO2: 96 % O2 Device:SpO2: 96 % O2 Flow Rate: .O2 Flow Rate (L/min): 7 L/min  IO: Intake/output summary:  Intake/Output Summary (Last 24 hours) at 01/25/2021 1418 Last data filed at 01/25/2021 1307 Gross per 24 hour  Intake 845.05 ml  Output 1050 ml  Net -204.95 ml    LBM: Last BM Date: 01/21/21 Baseline Weight: Weight: 74.4 kg Most recent weight: Weight: 67.1 kg     Palliative Assessment/Data:   Flowsheet Rows    Flowsheet Row Most Recent Value  Intake Tab   Referral Department Hospitalist  Unit at Time of Referral Intermediate Care Unit  Palliative Care Primary Diagnosis Sepsis/Infectious Disease  Date Notified 01/24/21  Palliative Care Type New Palliative care  Reason for referral Clarify Goals of Care  Date of Admission 01/14/2021  Date first seen by Palliative Care 01/25/21  # of days Palliative referral response time 1 Day(s)  # of days IP prior to Palliative referral 5  Clinical Assessment   Palliative Performance Scale Score 30%  Pain Max last 24 hours Not able to report  Pain Min Last 24 hours Not able to report  Dyspnea Max Last 24 Hours Not able to report  Dyspnea Min Last 24 hours Not able to report  Psychosocial & Spiritual Assessment   Palliative Care Outcomes        Time In: 0940 Time Out: 1030 Time Total: 50 minutes  Greater than 50%  of this time was spent counseling and coordinating care related to the above assessment and plan.  Signed by: Drue Novel, NP   Please contact Palliative Medicine Team phone at [phone] for questions and concerns.  For individual provider: See Shea Evans

## 2021-01-25 NOTE — Consult Note (Signed)
[facility] Oncology Progress Note  Name: Devin [name]      MRN: [id number]    Location: IC02/IC02-01  Date: 01/25/2021 Time:5:17 PM   Subjective: Interval History:Hersel Lenna Sciara Babicz is seen today in the ICU.  He is on BiPAP with 60% oxygen.  No sign of active bleeding.  His wife is at bedside.  He did receive IVIG on 01/21/2021 on 01/22/2021.  He is also febrile from sepsis from pneumonia.  Objective: Vital signs in last 24 hours: Temp:  [98.8 F (37.1 C)-103.7 F (39.8 C)] 100.4 F (38 C) (08/29 1712) Pulse Rate:  [75-116] 99 (08/29 1712) Resp:  [22-37] 31 (08/29 1712) BP: (120-153)/(61-85) 153/81 (08/29 1700) SpO2:  [94 %-98 %] 94 % (08/29 1712) FiO2 (%):  [60 %] 60 % (08/29 1521)    Intake/Output from previous day: 08/28 0800 - 08/29 0759 In: 1149.9 [I.V.:749.9] Out: 750 [Urine:750]    Intake/Output this shift: Total I/O In: 615 [I.V.:433.1; IV Piggyback:181.9] Out: 675 [Urine:675]   PHYSICAL EXAM: BP (!) 153/81   Pulse 99   Temp (!) 100.4 F (38 C) (Rectal) Comment: cooling blanket turned on & ice pack behind neck  Resp (!) 31   Ht '5\' 9"'$  (1.753 m)   Wt 147 lb 14.9 oz (67.1 kg)   SpO2 94%   BMI 21.85 kg/m  General appearance:  He is lethargic and on BiPAP. Head: Normocephalic, without obvious abnormality, atraumatic   Studies/Results: Results for orders placed or performed during the hospital encounter of 01/25/2021 (from the past 48 hour(s))  Glucose, capillary     Status: Abnormal   Collection Time: 01/23/21  6:32 PM  Result Value Ref Range   Glucose-Capillary 153 (H) 70 - 99 mg/dL    Comment: Glucose reference range applies only to samples taken after fasting for at least 8 hours.  Glucose, capillary     Status: Abnormal   Collection Time: 01/23/21  7:50 PM  Result Value Ref Range   Glucose-Capillary 175 (H) 70 - 99 mg/dL    Comment: Glucose reference range applies only to samples taken after fasting for at least 8 hours.  Culture, blood  (Routine X 2) w Reflex to ID Panel     Status: None (Preliminary result)   Collection Time: 01/23/21  8:02 PM   Specimen: BLOOD RIGHT HAND  Result Value Ref Range   Specimen Description BLOOD RIGHT HAND    Special Requests      BOTTLES DRAWN AEROBIC AND ANAEROBIC Blood Culture adequate volume   Culture      NO GROWTH 2 DAYS Performed at [facility], [address] [place], [place] [address]    Report Status PENDING   Culture, blood (Routine X 2) w Reflex to ID Panel     Status: None (Preliminary result)   Collection Time: 01/23/21  8:02 PM   Specimen: BLOOD LEFT HAND  Result Value Ref Range   Specimen Description BLOOD LEFT HAND    Special Requests      BOTTLES DRAWN AEROBIC AND ANAEROBIC Blood Culture adequate volume   Culture      NO GROWTH 2 DAYS Performed at [facility], [address], [place], [place] [address]    Report Status PENDING   Glucose, capillary     Status: Abnormal   Collection Time: 01/23/21 11:38 PM  Result Value Ref Range   Glucose-Capillary 140 (H) 70 - 99 mg/dL    Comment: Glucose reference range applies only to samples taken after fasting  for at least 8 hours.  Glucose, capillary     Status: Abnormal   Collection Time: 01/24/21  4:31 AM  Result Value Ref Range   Glucose-Capillary 133 (H) 70 - 99 mg/dL    Comment: Glucose reference range applies only to samples taken after fasting for at least 8 hours.  Glucose, capillary     Status: Abnormal   Collection Time: 01/24/21  6:47 AM  Result Value Ref Range   Glucose-Capillary 125 (H) 70 - 99 mg/dL    Comment: Glucose reference range applies only to samples taken after fasting for at least 8 hours.  Renal function panel     Status: Abnormal   Collection Time: 01/24/21 11:03 AM  Result Value Ref Range   Sodium 145 135 - 145 mmol/L    Comment: DELTA CHECK NOTED   Potassium 4.5 3.5 - 5.1 mmol/L   Chloride 112 (H) 98 - 111 mmol/L   CO2 26 22 - 32 mmol/L   Glucose, Bld 139 (H) 70 - 99 mg/dL    Comment:  Glucose reference range applies only to samples taken after fasting for at least 8 hours.   BUN 76 (H) 8 - 23 mg/dL   Creatinine, Ser 1.55 (H) 0.61 - 1.24 mg/dL   Calcium 10.0 8.9 - 10.3 mg/dL   Phosphorus 5.2 (H) 2.5 - 4.6 mg/dL   Albumin 2.7 (L) 3.5 - 5.0 g/dL   GFR, Estimated 47 (L) >60 mL/min    Comment: (NOTE) Calculated using the CKD-EPI Creatinine Equation (2021)    Anion gap 7 5 - 15    Comment: Performed at [facility], [address] [place], [place] [address]  CBC     Status: Abnormal   Collection Time: 01/24/21 11:03 AM  Result Value Ref Range   WBC 11.8 (H) 4.0 - 10.5 K/uL   RBC 3.89 (L) 4.22 - 5.81 MIL/uL   Hemoglobin 12.2 (L) 13.0 - 17.0 g/dL   HCT 39.6 39.0 - 52.0 %   MCV 101.8 (H) 80.0 - 100.0 fL   MCH 31.4 26.0 - 34.0 pg   MCHC 30.8 30.0 - 36.0 g/dL   RDW 14.3 11.5 - 15.5 %   Platelets 60 (L) 150 - 400 K/uL    Comment: Immature Platelet Fraction may be clinically indicated, consider ordering this additional test [id number]    nRBC 6.4 (H) 0.0 - 0.2 %    Comment: Performed at [facility], [address] [place], [place] [address]  Glucose, capillary     Status: Abnormal   Collection Time: 01/24/21 11:05 AM  Result Value Ref Range   Glucose-Capillary 139 (H) 70 - 99 mg/dL    Comment: Glucose reference range applies only to samples taken after fasting for at least 8 hours.  Glucose, capillary     Status: Abnormal   Collection Time: 01/24/21  4:07 PM  Result Value Ref Range   Glucose-Capillary 158 (H) 70 - 99 mg/dL    Comment: Glucose reference range applies only to samples taken after fasting for at least 8 hours.  Glucose, capillary     Status: Abnormal   Collection Time: 01/24/21 11:25 PM  Result Value Ref Range   Glucose-Capillary 133 (H) 70 - 99 mg/dL    Comment: Glucose reference range applies only to samples taken after fasting for at least 8 hours.  CBC     Status: Abnormal   Collection Time: 01/25/21  4:20 AM  Result Value Ref Range   WBC 13.3  (H)  4.0 - 10.5 K/uL   RBC 4.16 (L) 4.22 - 5.81 MIL/uL   Hemoglobin 12.8 (L) 13.0 - 17.0 g/dL   HCT 44.2 39.0 - 52.0 %   MCV 106.3 (H) 80.0 - 100.0 fL   MCH 30.8 26.0 - 34.0 pg   MCHC 29.0 (L) 30.0 - 36.0 g/dL   RDW 14.3 11.5 - 15.5 %   Platelets 47 (L) 150 - 400 K/uL    Comment: Immature Platelet Fraction may be clinically indicated, consider ordering this additional test [id number]    nRBC 4.4 (H) 0.0 - 0.2 %    Comment: Performed at [facility], [address], [place], [place] [address]  Renal function panel     Status: Abnormal   Collection Time: 01/25/21  4:21 AM  Result Value Ref Range   Sodium 150 (H) 135 - 145 mmol/L   Potassium 5.1 3.5 - 5.1 mmol/L   Chloride 115 (H) 98 - 111 mmol/L   CO2 27 22 - 32 mmol/L   Glucose, Bld 167 (H) 70 - 99 mg/dL    Comment: Glucose reference range applies only to samples taken after fasting for at least 8 hours.   BUN 84 (H) 8 - 23 mg/dL   Creatinine, Ser 1.41 (H) 0.61 - 1.24 mg/dL   Calcium 10.0 8.9 - 10.3 mg/dL   Phosphorus 5.0 (H) 2.5 - 4.6 mg/dL   Albumin 2.6 (L) 3.5 - 5.0 g/dL   GFR, Estimated 52 (L) >60 mL/min    Comment: (NOTE) Calculated using the CKD-EPI Creatinine Equation (2021)    Anion gap 8 5 - 15    Comment: Performed at [facility], [address] [place], [place] [address]  Glucose, capillary     Status: Abnormal   Collection Time: 01/25/21  4:26 AM  Result Value Ref Range   Glucose-Capillary 168 (H) 70 - 99 mg/dL    Comment: Glucose reference range applies only to samples taken after fasting for at least 8 hours.  Glucose, capillary     Status: Abnormal   Collection Time: 01/25/21  8:02 AM  Result Value Ref Range   Glucose-Capillary 154 (H) 70 - 99 mg/dL    Comment: Glucose reference range applies only to samples taken after fasting for at least 8 hours.  Glucose, capillary     Status: Abnormal   Collection Time: 01/25/21 11:03 AM  Result Value Ref Range   Glucose-Capillary 138 (H) 70 - 99 mg/dL    Comment:  Glucose reference range applies only to samples taken after fasting for at least 8 hours.  Glucose, capillary     Status: Abnormal   Collection Time: 01/25/21  4:59 PM  Result Value Ref Range   Glucose-Capillary 170 (H) 70 - 99 mg/dL    Comment: Glucose reference range applies only to samples taken after fasting for at least 8 hours.   DG CHEST PORT 1 VIEW  Result Date: 01/25/2021 CLINICAL DATA:  75 year old male with history of sepsis and dyspnea. Respiratory distress. EXAM: PORTABLE CHEST 1 VIEW COMPARISON:  Chest x-ray 01/24/2021. FINDINGS: Lung volumes are normal. Patchy areas of interstitial prominence, diffuse peribronchial cuffing, and patchy ill-defined opacities are again noted in the lungs bilaterally, most severe throughout the right lung. No pleural effusions. No pneumothorax. No evidence of pulmonary edema. Heart size is normal. Upper mediastinal contours are within normal limits. Atherosclerotic calcifications in the thoracic aorta. Status post median sternotomy for CABG. IMPRESSION: 1. Persistent multilobar bilateral (right greater than left) pneumonia, without substantial change,  as above. 2. Aortic atherosclerosis. Electronically Signed   By: Vinnie Langton M.D.   On: 01/25/2021 07:48   DG CHEST PORT 1 VIEW  Result Date: 01/24/2021 CLINICAL DATA:  Dyspnea. EXAM: PORTABLE CHEST 1 VIEW COMPARISON:  January 23, 2021. FINDINGS: The heart size and mediastinal contours are within normal limits. Status post coronary bypass graft. Stable bilateral lung opacities are noted, right greater than left, concerning for multifocal pneumonia. The visualized skeletal structures are unremarkable. IMPRESSION: Stable bilateral lung opacities are noted concerning for multifocal pneumonia. Aortic Atherosclerosis (ICD10-I70.0). Electronically Signed   By: Marijo Conception M.D.   On: 01/24/2021 13:34     MEDICATIONS: I have reviewed the patient's current medications.     Assessment/Plan:  1.  Severe  thrombocytopenia: - Thought to be secondary to septic thrombocytopenia. - Received IVIG on 01/21/2021 and 01/22/2021, 1 g/kg. - His platelet count increased to 60 K on 01/24/2021.  Today it has decreased to 47K. - No sign of active bleeding at this time.  No indication for further IVIG. - Consider platelet transfusion to keep platelet count above 50 K if there is any active bleeding. - We will monitor LDH level tomorrow.  Discussed with wife at bedside.  2.  Community-acquired pneumonia: - Antibiotics were changed to vancomycin and cefepime. - He is continuing to have increased oxygen requirement.  3.  Hypercalcemia: - His calcium level has improved to 10.0. - Reviewed lab work-up from 01/22/2021.  Kappa light chains elevated at 29.7 with normal ratio of 1.55.  Vitamin D was 51.  TSH was 1.41. - PTH level was 23 with a calcium level 9.9.  PTH RP, SPEP pending.  All questions were answered. The patient knows to call the clinic with any problems, questions or concerns. We can certainly see the patient much sooner if necessary.    Derek Jack

## 2021-01-25 NOTE — Progress Notes (Addendum)
Patient Demographics:    Devin [name], is a 75 y.o. male, DOB - May 08, 1946, BDZ:[id number]  Admit date - 01/14/2021   Admitting Physician Luverna Degenhart Denton Brick, MD  Outpatient Primary MD for the patient is [name], MD  LOS - 6   Chief Complaint  Patient presents with   Altered Mental Status        Subjective:    Devin [name] today has  no emesis,    -Remains very sleepy--- not really responsive -Unable to come off BiPAP -Wife at bedside -Prognosis remains pretty poor  Assessment  & Plan :    Principal Problem:   Severe sepsis with acute organ dysfunction due to Rt Sided PNA Active Problems:   CAP (community acquired pneumonia)   Severe Thrombocytopenia (HCC)   Acute HFrEF (heart failure with reduced ejection fraction) --EF 20 %   NSTEMI (non-ST elevated myocardial infarction) Type 2 Due to Sepsis   Acute respiratory failure with hypoxia due to CHF and pneumonia   Palliative care encounter   PAD (peripheral artery disease) ([facility])   Acute metabolic encephalopathy   Hyperlipidemia   Hypertension   Hypernatremia   Hypercalcemia   Dehydration   Hypokalemia  Brief Summary:- 75 y.o. year old male with history of coronary artery disease, GERD, hypertension, stenosis of carotid artery, COPD, TIA , PAD  admitted on 01/07/2021 with severe sepsis secondary to community-acquired pneumonia and acute hypoxic resp failure and persistent severe  thrombocytopenia, as well as HFrEF/NSTEMI--- due to  type II NSTEMI due to demand ischemia in the setting of sepsis and pneumonia with acute respiratory failure and hypoxia--compounded by acute systolic dysfunction CHF exacerbation and AKI with electrolyte abnormalities  A/p  1) severe sepsis with acute hypoxic respiratory failure  secondary to community-acquired pneumonia ([facility])-- Patient met sepsis criteria on admission with tachycardia and tachypnea and  finding of right upper lobe pneumonia  -01/25/21 -Patient with persistent fever and significant lethargy--cooling blankets removed on 01/25/2021 -Respiratory status remains very tenuous -Desaturates very quickly with attempt to take him off BiPAP -Significantly increased work of breathing and persistent tachypnea -HIV negative -Chest x-ray from 01/21/2021 and repeat on 01/22/21 and 01/23/21 and 01/24/21 and 01/25/21 showed worsening bilateral pneumonia right >> left  -CT abdomen and pelvis with right-sided pneumonia otherwise no acute intra-abdominal findings -Continue bronchodilators and mucolytics -Strep pneumo antigen negative, Legionella pending -Given worsening pneumonia stop Rocephin and azithromycin and start on Vanco and cefepime instead as of 01/23/2021 Discussed with pulmonary critical care attending Dr. Halford Chessman previously --Quite lethargic , does not respond to verbal or tactile stimuli, some response to noxious stimuli Extensive conversation with family --they are aware that prognosis is poor  2) severe thrombocytopenia--- discussed with Dr. Delton Coombes -Patient with longstanding history of thrombocytopenia platelets usually around 100 K -Platelets 30 >> 26 >>30>>29 k>>31k>>60>>47 k -Pathology smear reviewed  -LDH elevated at 666>>956 -Serum copper is low at 30 -B12 is not low, -Folate is normal -PTT is 34, INR is 1.3, Fibrinogen is low normal L at 219 -We will transfuse if platelet count less than 10 K or concerns for bleeding -Stopped Pletal -Per Dr. Delton Coombes -received IVIG 01/21/2021 on 01/22/2021  for possible sepsis induced thrombocytopenia Versus ITP -Continue steroids  3) chronic anemia-with normal MCV  MCH and RDW -Patient hemoglobin usually between 10 and 11, on admission hemoglobin was 13.8 most likely due to dehydration,  -Hemoglobin is stable -No obvious bleeding noted  4) HFrEF/NSTEMI--- suspect type II NSTEMI due to demand ischemia in the setting of sepsis and  pneumonia with acute respiratory failure and hypoxia--compounded by acute systolic dysfunction CHF exacerbation -No chest pain Troponin--- 41>>535>>786>>740>>725>>839 BNP 1,138 -Discussed with Dr Harl Bowie Pt is -Not a candidate for IV heparin or antiplatelet agent and Not a candidate for LHC due to severe , persistent thrombocytopenia -Continue IV metoprolol Stopped IV Lasix due to AKI - 5)AKI--creatinine is down to 1.41 from 1.55 - baseline around 0.9, s --suspect AKI is due to IV Lasix/diuresis as above #4 -Discontinue IV Lasix -Watch renal function closely as patient is on vancomycin renally adjust medications, avoid nephrotoxic agents / dehydration  / hypotension  6) acute metabolic encephalopathy---secondary to #1 above, and #8 below -CT head negative for acute findings -MRI brain without acute finding -Suspect related to infection and HF -Patient becoming more more lethargic and unresponsive  6)HTN-- iv metoprolol as ordered  7)PAD--s/p bilateral femoropopliteal bypass, with chronic occlusion of right graft  -Pletal discontinued due to severe thrombocytopenia  8) hypernatremia and hypercalcemia/hypokalemia--may be dehydration related, recheck after further hydration -=-Sodium normalized with hydration Be judicious with IV fluid due to heart failure  9) acute COPD exacerbation--- secondary to #1 above, manage as above #1  10)Social/ethics--- discussed with patient's wife and grand-daughter- she request DNR/DNI status ,however request full scope of treatment without limitations 01/25/21 Patient has persistent fevers -Patient is very lethargic -Respiratory status remains very tenuous -Desaturates very quickly with attempt to take him off BiPAP -Tachycardia and tachypnea persist -Poor urine output -Official palliative care consult appreciated --Overall prognosis is Not encouraging  11) hypernatremia--- due to free water deficit, be judicious with IV fluids given low EF of  20%  Disposition/Need for in-Hospital Stay- patient unable to be discharged at this time due to severe sepsis secondary to pneumonia requiring IV antibiotics and NSTEMI with heart failure requiring further support with BiPAP Status is: Inpatient  Remains inpatient appropriate because: Please see disposition above  Disposition: The patient is from: Home              Anticipated d/c is to: ??? SNF               Anticipated d/c date is: > 3 days              Patient currently is not medically stable to d/c. Barriers: Not Clinically Stable-   Code Status :  -  Code Status: DNR   Family Communication:    (patient is alert, awake and coherent)  Discussed with wife at [phone]----and in Person at bedside Discussed with granddaughter [name] and daugther in law Consults  : Dr. Delton Coombes -Curbside consult with Dr. Halford Chessman from pulmonary critical care service  DVT Prophylaxis  :   - SCDs *  SCDs Start: 01/02/2021 2246  Lab Results  Component Value Date   PLT 47 (L) 01/25/2021    Inpatient Medications  Scheduled Meds:  amLODipine  10 mg Oral Daily   atorvastatin  20 mg Oral Daily   Chlorhexidine Gluconate Cloth  6 each Topical Daily   ferrous sulfate  325 mg Oral Q breakfast   ipratropium  0.5 mg Nebulization BID   levalbuterol  1.25 mg Nebulization BID   methylPREDNISolone (SOLU-MEDROL) injection  40 mg Intravenous Q12H   metoprolol tartrate  5 mg Intravenous Q6H   mometasone-formoterol  2 puff Inhalation BID   vitamin B-12  1,000 mcg Oral Daily   Continuous Infusions:  ceFEPime (MAXIPIME) IV Stopped (01/25/21 1002)   dextrose 75 mL/hr at 01/25/21 1548   vancomycin Stopped (01/25/21 1028)   PRN Meds:.acetaminophen, hydrALAZINE, LORazepam    Anti-infectives (From admission, onward)    Start     Dose/Rate Route Frequency Ordered Stop   01/24/21 0900  vancomycin (VANCOREADY) IVPB 500 mg/100 mL        500 mg 100 mL/hr over 60 Minutes Intravenous Every 12 hours 01/23/21 1824      01/23/21 1915  ceFEPIme (MAXIPIME) 2 g in sodium chloride 0.9 % 100 mL IVPB        2 g 200 mL/hr over 30 Minutes Intravenous Every 12 hours 01/23/21 1824     01/23/21 1915  vancomycin (VANCOREADY) IVPB 1250 mg/250 mL        1,250 mg 166.7 mL/hr over 90 Minutes Intravenous  Once 01/23/21 1824 01/24/21 0944   01/21/21 2000  cefTRIAXone (ROCEPHIN) 2 g in sodium chloride 0.9 % 100 mL IVPB  Status:  Discontinued        2 g 200 mL/hr over 30 Minutes Intravenous Every 24 hours 01/21/21 1750 01/23/21 1742   01/20/21 2000  cefTRIAXone (ROCEPHIN) 1 g in sodium chloride 0.9 % 100 mL IVPB  Status:  Discontinued        1 g 200 mL/hr over 30 Minutes Intravenous Every 24 hours 01/06/2021 2245 01/21/21 1750   01/25/2021 2100  azithromycin (ZITHROMAX) 500 mg in sodium chloride 0.9 % 250 mL IVPB  Status:  Discontinued        500 mg 250 mL/hr over 60 Minutes Intravenous Every 24 hours 01/10/2021 2056 01/23/21 1742   01/07/2021 1900  cefTRIAXone (ROCEPHIN) 1 g in sodium chloride 0.9 % 100 mL IVPB        1 g 200 mL/hr over 30 Minutes Intravenous  Once 01/12/2021 1851 01/09/2021 1949         Objective:   Vitals:   01/25/21 1600 01/25/21 1650 01/25/21 1700 01/25/21 1712  BP: (!) 147/79  (!) 153/81   Pulse: 97  (!) 101 99  Resp: (!) 32  (!) 29 (!) 31  Temp:  100.2 F (37.9 C)  (!) 100.4 F (38 C)  TempSrc:  Rectal  Rectal  SpO2: 96%  96% 94%  Weight:      Height:        Wt Readings from Last 3 Encounters:  01/21/21 67.1 kg  04/14/20 75.4 kg  12/23/19 73.4 kg     Intake/Output Summary (Last 24 hours) at 01/25/2021 1833 Last data filed at 01/25/2021 1701 Gross per 24 hour  Intake 1337.36 ml  Output 1275 ml  Net 62.36 ml   Physical Exam  Gen:-Quite lethargic , does not respond to verbal or tactile stimuli, some response to noxious stimuli HEENT:- [place].AT, No sclera icterus Nose-  BiPAP Neck-Supple Neck,No JVD,.  Lungs-diminished air movement, no rhonchi or wheezing, tachypnea noted CV- S1, S2  normal, regular , tachycardic Abd-  +ve B.Sounds, Abd Soft, No tenderness,    Extremity/Skin:- No  edema, pedal pulses present , no significant ecchymosis, no petechia no bleeding concerns on mucosal or skin NeuroPsych--very sleepy, no longer agitated or restless, not really responsive at this time   Data Review:   Micro Results Recent Results (from the past 240 hour(s))  Blood culture (routine x 2)  Status: None   Collection Time: 01/06/2021  6:35 PM   Specimen: BLOOD RIGHT HAND  Result Value Ref Range Status   Specimen Description BLOOD RIGHT HAND  Final   Special Requests   Final    BOTTLES DRAWN AEROBIC AND ANAEROBIC Blood Culture adequate volume   Culture   Final    NO GROWTH 5 DAYS Performed at [facility], [address], [place], [place] [address]    Report Status 01/24/2021 FINAL  Final  Blood culture (routine x 2)     Status: None   Collection Time: 01/22/2021  6:35 PM   Specimen: BLOOD LEFT HAND  Result Value Ref Range Status   Specimen Description BLOOD LEFT HAND  Final   Special Requests   Final    BOTTLES DRAWN AEROBIC AND ANAEROBIC Blood Culture adequate volume   Culture   Final    NO GROWTH 5 DAYS Performed at [facility], [address] [place], [place] [address]    Report Status 01/24/2021 FINAL  Final  Resp Panel by RT-PCR (Flu A&B, Covid) Nasopharyngeal Swab     Status: None   Collection Time: 01/18/2021  6:37 PM   Specimen: Nasopharyngeal Swab; Nasopharyngeal(NP) swabs in vial transport medium  Result Value Ref Range Status   SARS Coronavirus 2 by RT PCR NEGATIVE NEGATIVE Final    Comment: (NOTE) SARS-CoV-2 target nucleic acids are NOT DETECTED.  The SARS-CoV-2 RNA is generally detectable in upper respiratory specimens during the acute phase of infection. The lowest concentration of SARS-CoV-2 viral copies this assay can detect is 138 copies/mL. A negative result does not preclude SARS-Cov-2 infection and should not be used as the sole basis for  treatment or other patient management decisions. A negative result may occur with  improper specimen collection/handling, submission of specimen other than nasopharyngeal swab, presence of viral mutation(s) within the areas targeted by this assay, and inadequate number of viral copies(<138 copies/mL). A negative result must be combined with clinical observations, patient history, and epidemiological information. The expected result is Negative.  Fact Sheet for Patients:  EntrepreneurPulse.com.au  Fact Sheet for Healthcare Providers:  IncredibleEmployment.be  This test is no t yet approved or cleared by the [place] FDA and  has been authorized for detection and/or diagnosis of SARS-CoV-2 by FDA under an Emergency Use Authorization (EUA). This EUA will remain  in effect (meaning this test can be used) for the duration of the COVID-19 declaration under Section 564(b)(1) of the Act, 21 U.S.C.section 360bbb-3(b)(1), unless the authorization is terminated  or revoked sooner.       Influenza A by PCR NEGATIVE NEGATIVE Final   Influenza B by PCR NEGATIVE NEGATIVE Final    Comment: (NOTE) The Xpert Xpress SARS-CoV-2/FLU/RSV plus assay is intended as an aid in the diagnosis of influenza from Nasopharyngeal swab specimens and should not be used as a sole basis for treatment. Nasal washings and aspirates are unacceptable for Xpert Xpress SARS-CoV-2/FLU/RSV testing.  Fact Sheet for Patients: EntrepreneurPulse.com.au  Fact Sheet for Healthcare Providers: IncredibleEmployment.be  This test is not yet approved or cleared by the [place] FDA and has been authorized for detection and/or diagnosis of SARS-CoV-2 by FDA under an Emergency Use Authorization (EUA). This EUA will remain in effect (meaning this test can be used) for the duration of the COVID-19 declaration under Section 564(b)(1) of the Act, 21  U.S.C. section 360bbb-3(b)(1), unless the authorization is terminated or revoked.  Performed at [facility], [address] [place], [place] [address]  MRSA Next Gen by PCR, Nasal     Status: None   Collection Time: 01/21/21  9:13 PM   Specimen: Nasal Mucosa; Nasal Swab  Result Value Ref Range Status   MRSA by PCR Next Gen NOT DETECTED NOT DETECTED Final    Comment: (NOTE) The GeneXpert MRSA Assay (FDA approved for NASAL specimens only), is one component of a comprehensive MRSA colonization surveillance program. It is not intended to diagnose MRSA infection nor to guide or monitor treatment for MRSA infections. Test performance is not FDA approved in patients less than 32 years old. Performed at [facility], [address] [place], [place] [address]   Urine Culture     Status: None   Collection Time: 01/22/21  5:42 AM   Specimen: Urine, Clean Catch  Result Value Ref Range Status   Specimen Description   Final    URINE, CLEAN CATCH Performed at [facility], [address], [place], [place] [address]    Special Requests   Final    NONE Performed at [facility], [address] [place], [place] [address]    Culture   Final    NO GROWTH Performed at [facility], [place] [address], [place], [place] [address]    Report Status 01/24/2021 FINAL  Final  Culture, blood (Routine X 2) w Reflex to ID Panel     Status: None (Preliminary result)   Collection Time: 01/23/21  8:02 PM   Specimen: BLOOD RIGHT HAND  Result Value Ref Range Status   Specimen Description BLOOD RIGHT HAND  Final   Special Requests   Final    BOTTLES DRAWN AEROBIC AND ANAEROBIC Blood Culture adequate volume   Culture   Final    NO GROWTH 2 DAYS Performed at [facility], [address] [place], [place] [address]    Report Status PENDING  Incomplete  Culture, blood (Routine X 2) w Reflex to ID Panel     Status: None (Preliminary result)   Collection Time: 01/23/21  8:02 PM   Specimen: BLOOD LEFT HAND   Result Value Ref Range Status   Specimen Description BLOOD LEFT HAND  Final   Special Requests   Final    BOTTLES DRAWN AEROBIC AND ANAEROBIC Blood Culture adequate volume   Culture   Final    NO GROWTH 2 DAYS Performed at [facility], [address], [place],  [address]    Report Status PENDING  Incomplete    Radiology Reports CT HEAD WO CONTRAST (5MM)  Result Date: 01/11/2021 CLINICAL DATA:  Mental status changes EXAM: CT HEAD WITHOUT CONTRAST TECHNIQUE: Contiguous axial images were obtained from the base of the skull through the vertex without intravenous contrast. COMPARISON:  11/27/2001 FINDINGS: Brain: There is atrophy and chronic small vessel disease changes. No acute intracranial abnormality. Specifically, no hemorrhage, hydrocephalus, mass lesion, acute infarction, or significant intracranial injury. Vascular: No hyperdense vessel or unexpected calcification. Skull: No acute calvarial abnormality. Sinuses/Orbits: No acute findings Other: None IMPRESSION: Atrophy, chronic microvascular disease. No acute intracranial abnormality. Electronically Signed   By: Rolm Baptise M.D.   On: 01/25/2021 18:58   MR BRAIN WO CONTRAST  Result Date: 01/21/2021 CLINICAL DATA:  Mental status change, unknown cause. EXAM: MRI HEAD WITHOUT CONTRAST TECHNIQUE: Multiplanar, multiecho pulse sequences of the brain and surrounding structures were obtained without intravenous contrast. COMPARISON:  Head CT January 19, 2021 FINDINGS: The study is partially degraded by motion. Brain: No acute infarction, hemorrhage, hydrocephalus, extra-axial collection or mass lesion.  Scattered and confluent foci of T2 hyperintensity are seen within the white matter of the cerebral hemispheres nonspecific, most likely related to chronic small vessel ischemia. Moderate parenchymal volume loss. Vascular: Normal flow voids. Skull and upper cervical spine: Negative. Sinuses/Orbits: Bilateral lens surgery. Paranasal sinuses are  clear. Other: None. IMPRESSION: 1. Study partially limited by motion. 2. No acute intracranial abnormality. 3. Moderate chronic ischemic changes of the white matter and parenchymal volume. Electronically Signed   By: Pedro Earls M.D.   On: 01/21/2021 13:17   CT ABDOMEN PELVIS W CONTRAST  Result Date: 01/21/2021 CLINICAL DATA:  Acute abdominal pain, nonlocalized. Generalized abdominal pain, weakness, anemia, sepsis secondary to community-acquired pneumonia. EXAM: CT ABDOMEN AND PELVIS WITH CONTRAST TECHNIQUE: Multidetector CT imaging of the abdomen and pelvis was performed using the standard protocol following bolus administration of intravenous contrast. CONTRAST:  61m OMNIPAQUE IOHEXOL 350 MG/ML SOLN COMPARISON:  Chest radiograph 01/21/2021 FINDINGS: Lower chest: Airspace consolidation in the right lower lung consistent with pneumonia. Cardiac enlargement. Postoperative changes in the mediastinum. Hepatobiliary: Mild diffuse fatty infiltration of the liver. Poorly defined focal low-attenuation lesion in segment 7 of the liver measuring 1.2 cm diameter. There appears to be vague peripheral enhancement. While this may represent a hemangioma, the CT appearance is indeterminate. Consider further characterization with MRI in the nonemergent setting as clinically indicated. Cholelithiasis with small stone in the dependent gallbladder. No inflammatory changes. Bile ducts are not dilated. Pancreas: Unremarkable. No pancreatic ductal dilatation or surrounding inflammatory changes. Spleen: Normal in size without focal abnormality. Adrenals/Urinary Tract: Adrenal glands are unremarkable. Kidneys are normal, without renal calculi, focal lesion, or hydronephrosis. Bladder is unremarkable. Stomach/Bowel: Stomach, small bowel, and colon are not abnormally distended. Stool throughout the colon. A patent medication also demonstrated in the colon. No wall thickening or inflammatory changes appreciated.  Appendix is not identified. Vascular/Lymphatic: Diffuse prominent calcification of the aorta and branch vessels. No aneurysm. Acute change in diameter of the distal common femoral artery on the right with absence of flow distally suggesting thrombosis or occlusion. Suggestion of a femoral bypass graft which is also occluded. There is no retroperitoneal lymphadenopathy. There is evidence of lymphadenopathy in the porta hepatis, with enlarged lymph nodes in the hepatic hilum measuring 1.8 cm short axis dimension. Reproductive: Prostate is unremarkable. Other: No free air or free fluid in the abdomen. Abdominal wall musculature appears intact. Musculoskeletal: Degenerative changes in the spine. Old right rib fractures. IMPRESSION: 1. Airspace consolidation in the right lower lung consistent with pneumonia. 2. Indeterminate lesion in segment 7 of the liver. Consider further characterization with nonemergent MRI if clinically indicated. 3. Lymphadenopathy in the hepatic hilum with multiple lymph nodes measuring up to 1.8 cm short axis dimension. Nonspecific etiology. Malignant/metastatic versus reactive. 4. Absence of flow demonstrated in the distal common femoral artery extending into the superficial femoral artery on the right. Possible thrombosed bypass graft, incompletely visualized. Electronically Signed   By: WLucienne CapersM.D.   On: 01/21/2021 21:14   DG CHEST PORT 1 VIEW  Result Date: 01/25/2021 CLINICAL DATA:  75year old male with history of sepsis and dyspnea. Respiratory distress. EXAM: PORTABLE CHEST 1 VIEW COMPARISON:  Chest x-ray 01/24/2021. FINDINGS: Lung volumes are normal. Patchy areas of interstitial prominence, diffuse peribronchial cuffing, and patchy ill-defined opacities are again noted in the lungs bilaterally, most severe throughout the right lung. No pleural effusions. No pneumothorax. No evidence of pulmonary edema. Heart size is normal. Upper mediastinal contours are within normal  limits. Atherosclerotic  calcifications in the thoracic aorta. Status post median sternotomy for CABG. IMPRESSION: 1. Persistent multilobar bilateral (right greater than left) pneumonia, without substantial change, as above. 2. Aortic atherosclerosis. Electronically Signed   By: Vinnie Langton M.D.   On: 01/25/2021 07:48   DG CHEST PORT 1 VIEW  Result Date: 01/24/2021 CLINICAL DATA:  Dyspnea. EXAM: PORTABLE CHEST 1 VIEW COMPARISON:  January 23, 2021. FINDINGS: The heart size and mediastinal contours are within normal limits. Status post coronary bypass graft. Stable bilateral lung opacities are noted, right greater than left, concerning for multifocal pneumonia. The visualized skeletal structures are unremarkable. IMPRESSION: Stable bilateral lung opacities are noted concerning for multifocal pneumonia. Aortic Atherosclerosis (ICD10-I70.0). Electronically Signed   By: Marijo Conception M.D.   On: 01/24/2021 13:34   DG Chest Port 1 View  Result Date: 01/23/2021 CLINICAL DATA:  Shortness of breath, right-sided pneumonia EXAM: PORTABLE CHEST 1 VIEW COMPARISON:  01/22/2021 FINDINGS: Multifocal patchy opacities, right upper lobe predominant, progressive. No pleural effusion or pneumothorax. The heart is top-normal in size. Postsurgical changes related to prior CABG. Thoracic aortic atherosclerosis. Median sternotomy. IMPRESSION: Multifocal patchy opacities, right upper lobe predominant, progressive. This appearance is compatible with worsening pneumonia. Electronically Signed   By: Julian Hy M.D.   On: 01/23/2021 03:56   DG CHEST PORT 1 VIEW  Result Date: 01/22/2021 CLINICAL DATA:  Pneumonia EXAM: PORTABLE CHEST 1 VIEW COMPARISON:  01/21/2021 FINDINGS: No significant change in AP portable chest radiograph with diffuse heterogeneous and interstitial airspace opacity, somewhat coarse appearing, and cardiomegaly status post median sternotomy. IMPRESSION: 1. No significant change in AP portable chest  radiograph with diffuse heterogeneous and interstitial airspace opacity, somewhat coarse appearing, this may reflect a combination of acute airspace disease and chronic underlying interstitial change. This could be further assessed by CT if desired. 2.  Cardiomegaly status post median sternotomy. Electronically Signed   By: Eddie Candle M.D.   On: 01/22/2021 07:59   DG CHEST PORT 1 VIEW  Result Date: 01/21/2021 CLINICAL DATA:  Altered level of consciousness, short of breath EXAM: PORTABLE CHEST 1 VIEW COMPARISON:  01/06/2021 FINDINGS: Single frontal view of the chest demonstrates a stable cardiac silhouette. There is chronic background interstitial scarring, with superimposed right perihilar airspace disease that has progressed since prior study. No effusion or pneumothorax. No acute bony abnormalities. IMPRESSION: 1. Increasing right perihilar airspace disease, consistent with acute pneumonia superimposed upon chronic background lung scarring. Electronically Signed   By: Randa Ngo M.D.   On: 01/21/2021 17:34   DG Chest Port 1 View  Result Date: 12/28/2020 CLINICAL DATA:  Cough. EXAM: PORTABLE CHEST 1 VIEW COMPARISON:  Chest x-ray dated July 14, 2018. FINDINGS: Stable cardiomediastinal silhouette status post CABG. Patchy opacities in the right upper lobe. No pleural effusion or pneumothorax. No acute osseous abnormality. IMPRESSION: 1. Right upper lobe pneumonia. Electronically Signed   By: Titus Dubin M.D.   On: 12/29/2020 18:10   ECHOCARDIOGRAM COMPLETE  Result Date: 01/22/2021    ECHOCARDIOGRAM REPORT   Patient Name:   RIGHTEOUS CLAIBORNE Date of Exam: 01/22/2021 Medical Rec #:  [id number]          Height:       69.0 in Accession #:    [id number]         Weight:       147.9 lb Date of Birth:  [birth year]           BSA:  1.817 m Patient Age:    77 years           BP:           146/81 mmHg Patient Gender: M                  HR:           109 bpm. Exam Location:  [facility] Procedure: 2D  Echo, Cardiac Doppler and Color Doppler Indications:    Dyspnea  History:        Patient has prior history of Echocardiogram examinations, most                 recent 04/30/2018. CAD, Prior CABG, TIA, Arrythmias:LBBB; Risk                 Factors:Hypertension and Dyslipidemia.  Sonographer:    Wenda Low Referring Phys: [place]  1. The anteroseptal, anterior, antoerlateral walls are akinetic. The entire apex is akinetic. Mid to distal inferior wall is akinetic. Marland Kitchen Left ventricular ejection fraction, by estimation, is 20%. The left ventricle has severely decreased function. The left ventricle demonstrates regional wall motion abnormalities (see scoring diagram/findings for description). Left ventricular diastolic parameters are indeterminate.  2. Right ventricular systolic function is normal. The right ventricular size is normal. There is moderately elevated pulmonary artery systolic pressure.  3. The mitral valve is normal in structure. Mild mitral valve regurgitation. No evidence of mitral stenosis.  4. The aortic valve is tricuspid. There is mild calcification of the aortic valve. There is mild thickening of the aortic valve. Aortic valve regurgitation is not visualized. No aortic stenosis is present.  5. The inferior vena cava is dilated in size with >50% respiratory variability, suggesting right atrial pressure of 8 mmHg. FINDINGS  Left Ventricle: The anteroseptal, anterior, antoerlateral walls are akinetic. The entire apex is akinetic. Mid to distal inferior wall is akinetic. Left ventricular ejection fraction, by estimation, is 20%. The left ventricle has severely decreased function. The left ventricle demonstrates regional wall motion abnormalities. The left ventricular internal cavity size was normal in size. There is no left ventricular hypertrophy. Left ventricular diastolic parameters are indeterminate. Right Ventricle: The right ventricular size is normal. Right vetricular  wall thickness was not assessed. Right ventricular systolic function is normal. There is moderately elevated pulmonary artery systolic pressure. The tricuspid regurgitant velocity is  3.54 m/s, and with an assumed right atrial pressure of 8 mmHg, the estimated right ventricular systolic pressure is 15.7 mmHg. Left Atrium: Left atrial size was normal in size. Right Atrium: Right atrial size was normal in size. Pericardium: There is no evidence of pericardial effusion. Mitral Valve: The mitral valve is normal in structure. Mild mitral valve regurgitation. No evidence of mitral valve stenosis. MV peak gradient, 2.5 mmHg. The mean mitral valve gradient is 1.0 mmHg. Tricuspid Valve: The tricuspid valve is not well visualized. Tricuspid valve regurgitation is mild . No evidence of tricuspid stenosis. Aortic Valve: The aortic valve is tricuspid. There is mild calcification of the aortic valve. There is mild thickening of the aortic valve. There is mild aortic valve annular calcification. Aortic valve regurgitation is not visualized. No aortic stenosis  is present. Aortic valve mean gradient measures 2.0 mmHg. Aortic valve peak gradient measures 4.0 mmHg. Aortic valve area, by VTI measures 1.80 cm. Pulmonic Valve: The pulmonic valve was not well visualized. Pulmonic valve regurgitation is not visualized. No evidence of pulmonic stenosis. Aorta: The aortic root is  normal in size and structure. Venous: The inferior vena cava is dilated in size with greater than 50% respiratory variability, suggesting right atrial pressure of 8 mmHg. IAS/Shunts: No atrial level shunt detected by color flow Doppler.  LEFT VENTRICLE PLAX 2D LVIDd:         5.44 cm      Diastology LVIDs:         4.45 cm      LV e' medial:    3.81 cm/s LV PW:         1.03 cm      LV E/e' medial:  13.3 LV IVS:        1.05 cm      LV e' lateral:   6.64 cm/s LVOT diam:     2.00 cm      LV E/e' lateral: 7.6 LV SV:         29 LV SV Index:   16 LVOT Area:     3.14 cm   LV Volumes (MOD) LV vol d, MOD A2C: 84.6 ml LV vol d, MOD A4C: 106.0 ml LV vol s, MOD A2C: 60.0 ml LV vol s, MOD A4C: 74.1 ml LV SV MOD A2C:     24.6 ml LV SV MOD A4C:     106.0 ml LV SV MOD BP:      27.2 ml RIGHT VENTRICLE RV Basal diam:  3.36 cm RV Mid diam:    2.73 cm RV S prime:     8.33 cm/s TAPSE (M-mode): 2.2 cm LEFT ATRIUM             Index       RIGHT ATRIUM           Index LA diam:        4.80 cm 2.64 cm/m  RA Area:     14.20 cm LA Vol (A2C):   45.1 ml 24.81 ml/m RA Volume:   31.10 ml  17.11 ml/m LA Vol (A4C):   57.3 ml 31.53 ml/m LA Biplane Vol: 54.0 ml 29.71 ml/m  AORTIC VALVE AV Area (Vmax):    1.99 cm AV Area (Vmean):   1.72 cm AV Area (VTI):     1.80 cm AV Vmax:           99.70 cm/s AV Vmean:          63.100 cm/s AV VTI:            0.162 m AV Peak Grad:      4.0 mmHg AV Mean Grad:      2.0 mmHg LVOT Vmax:         63.30 cm/s LVOT Vmean:        34.600 cm/s LVOT VTI:          0.093 m LVOT/AV VTI ratio: 0.57  AORTA Ao Root diam: 3.30 cm Ao Asc diam:  3.70 cm MITRAL VALVE               TRICUSPID VALVE MV Area (PHT): 4.21 cm    TR Peak grad:   50.1 mmHg MV Area VTI:   1.84 cm    TR Vmax:        354.00 cm/s MV Peak grad:  2.5 mmHg MV Mean grad:  1.0 mmHg    SHUNTS MV Vmax:       0.80 m/s    Systemic VTI:  0.09 m MV Vmean:      52.4 cm/s   Systemic Diam: 2.00 cm MV Decel  Time: 180 msec MV E velocity: 50.60 cm/s MV A velocity: 58.30 cm/s MV E/A ratio:  0.87 Carlyle Dolly MD Electronically signed by Carlyle Dolly MD Signature Date/Time: 01/22/2021/3:52:57 PM    Final      CBC Recent Labs  Lab 01/25/2021 1728 01/20/21 0445 01/21/21 0550 01/22/21 0244 01/23/21 0408 01/24/21 1103 01/25/21 0420  WBC 7.2   < > 10.7* 9.0 11.5* 11.8* 13.3*  HGB 13.8   < > 12.1* 11.3* 11.7* 12.2* 12.8*  HCT 40.7   < > 37.7* 34.2* 36.8* 39.6 44.2  PLT 30*   < > 30* 29* 31* 60* 47*  MCV 95.8   < > 95.9 95.8 97.6 101.8* 106.3*  MCH 32.5   < > 30.8 31.7 31.0 31.4 30.8  MCHC 33.9   < > 32.1 33.0 31.8 30.8 29.0*   RDW 14.0   < > 13.4 13.7 14.1 14.3 14.3  LYMPHSABS 1.5  --  1.1  --   --   --   --   MONOABS 0.9  --  0.9  --   --   --   --   EOSABS 0.1  --  0.1  --   --   --   --   BASOSABS 0.1  --  0.0  --   --   --   --    < > = values in this interval not displayed.    Chemistries  Recent Labs  Lab 01/22/2021 1728 01/20/21 0008 01/20/21 0445 01/21/21 0550 01/22/21 0244 01/22/21 0444 01/23/21 0408 01/24/21 1103 01/25/21 0421  NA 149*   < > 148* 147* 138  --  137 145 150*  K 3.3*   < > 3.4* 3.2* 3.5  --  4.3 4.5 5.1  CL 113*   < > 113* 110 109  --  109 112* 115*  CO2 25   < > '22 22 25  ' --  21* 26 27  GLUCOSE 112*   < > 134* 120* 143*  --  126* 139* 167*  BUN 34*   < > 30* 28* 27*  --  39* 76* 84*  CREATININE 1.09   < > 0.88 0.89 0.93  --  1.11 1.55* 1.41*  CALCIUM 11.9*   < > 10.8* 10.8* 9.3  9.9  --  9.6 10.0 10.0  MG  --   --   --   --  1.7  --   --   --   --   AST 42*  --  38 49*  --  48* 107*  --   --   ALT 30  --  29 38  --  35 38  --   --   ALKPHOS 166*  --  138* 140*  --  112 163*  --   --   BILITOT 1.5*  --  1.2 1.0  --  0.5 0.8  --   --    < > = values in this interval not displayed.   ------------------------------------------------------------------------------------------------------------------ No results for input(s): CHOL, HDL, LDLCALC, TRIG, CHOLHDL, LDLDIRECT in the last 72 hours.  No results found for: HGBA1C ------------------------------------------------------------------------------------------------------------------ No results for input(s): TSH, T4TOTAL, T3FREE, THYROIDAB in the last 72 hours.  Invalid input(s): FREET3  ------------------------------------------------------------------------------------------------------------------ No results for input(s): VITAMINB12, FOLATE, FERRITIN, TIBC, IRON, RETICCTPCT in the last 72 hours.   Coagulation profile Recent Labs  Lab 01/02/2021 1728  INR 1.3*    No results for input(s): DDIMER in the last 72  hours.  Cardiac Enzymes No results for input(s): CKMB, TROPONINI, MYOGLOBIN in the last 168 hours.  Invalid input(s): CK ------------------------------------------------------------------------------------------------------------------    Component Value Date/Time   BNP 1,138.0 (H) 01/23/2021 3428     Roxan Hockey M.D on 01/25/2021 at 6:33 PM  Go to [email or web address] - for contact info  [facility] - Office  [phone]

## 2021-01-26 LAB — COMPREHENSIVE METABOLIC PANEL
ALT: 53 U/L — ABNORMAL HIGH (ref 0–44)
AST: 103 U/L — ABNORMAL HIGH (ref 15–41)
Albumin: 2.6 g/dL — ABNORMAL LOW (ref 3.5–5.0)
Alkaline Phosphatase: 211 U/L — ABNORMAL HIGH (ref 38–126)
Anion gap: 4 — ABNORMAL LOW (ref 5–15)
BUN: 97 mg/dL — ABNORMAL HIGH (ref 8–23)
CO2: 33 mmol/L — ABNORMAL HIGH (ref 22–32)
Calcium: 10.2 mg/dL (ref 8.9–10.3)
Chloride: 113 mmol/L — ABNORMAL HIGH (ref 98–111)
Creatinine, Ser: 1.65 mg/dL — ABNORMAL HIGH (ref 0.61–1.24)
GFR, Estimated: 43 mL/min — ABNORMAL LOW (ref >60)
Glucose, Bld: 185 mg/dL — ABNORMAL HIGH (ref 70–99)
Potassium: 5.8 mmol/L — ABNORMAL HIGH (ref 3.5–5.1)
Sodium: 150 mmol/L — ABNORMAL HIGH (ref 135–145)
Total Bilirubin: 0.7 mg/dL (ref 0.3–1.2)
Total Protein: 7.8 g/dL (ref 6.5–8.1)

## 2021-01-26 LAB — PROTEIN ELECTROPHORESIS, SERUM
A/G Ratio: 0.6 — ABNORMAL LOW (ref 0.7–1.7)
Albumin ELP: 2.8 g/dL — ABNORMAL LOW (ref 2.9–4.4)
Alpha-1-Globulin: 0.4 g/dL (ref 0.0–0.4)
Alpha-2-Globulin: 0.8 g/dL (ref 0.4–1.0)
Beta Globulin: 0.8 g/dL (ref 0.7–1.3)
Gamma Globulin: 2.7 g/dL — ABNORMAL HIGH (ref 0.4–1.8)
Globulin, Total: 4.6 g/dL — ABNORMAL HIGH (ref 2.2–3.9)
M-Spike, %: 0.8 g/dL — ABNORMAL HIGH
Total Protein ELP: 7.4 g/dL (ref 6.0–8.5)

## 2021-01-26 LAB — CBC
HCT: 40.3 % (ref 39.0–52.0)
Hemoglobin: 11.5 g/dL — ABNORMAL LOW (ref 13.0–17.0)
MCH: 31.3 pg (ref 26.0–34.0)
MCHC: 28.5 g/dL — ABNORMAL LOW (ref 30.0–36.0)
MCV: 109.8 fL — ABNORMAL HIGH (ref 80.0–100.0)
Platelets: 69 10*3/uL — ABNORMAL LOW (ref 150–400)
RBC: 3.67 MIL/uL — ABNORMAL LOW (ref 4.22–5.81)
RDW: 13.7 % (ref 11.5–15.5)
WBC: 16.2 10*3/uL — ABNORMAL HIGH (ref 4.0–10.5)
nRBC: 6.1 % — ABNORMAL HIGH (ref 0.0–0.2)

## 2021-01-26 LAB — DIC (DISSEMINATED INTRAVASCULAR COAGULATION)PANEL
D-Dimer, Quant: >20 ug/mL-FEU — ABNORMAL HIGH (ref 0.00–0.50)
Fibrinogen: 149 mg/dL — ABNORMAL LOW (ref 210–475)
INR: 1.4 — ABNORMAL HIGH (ref 0.8–1.2)
Platelets: 67 10*3/uL — ABNORMAL LOW (ref 150–400)
Prothrombin Time: 16.9 seconds — ABNORMAL HIGH (ref 11.4–15.2)
Smear Review: NONE SEEN
aPTT: 26 seconds (ref 24–36)

## 2021-01-26 LAB — LACTATE DEHYDROGENASE: LDH: 2011 U/L — ABNORMAL HIGH (ref 98–192)

## 2021-01-26 LAB — GLUCOSE, CAPILLARY: Glucose-Capillary: 162 mg/dL — ABNORMAL HIGH (ref 70–99)

## 2021-01-26 MED ORDER — VANCOMYCIN HCL 750 MG/150ML IV SOLN: 750.0000 mg | INTRAVENOUS | Status: DC

## 2021-01-28 LAB — CULTURE, BLOOD (ROUTINE X 2)
Culture: NO GROWTH
Culture: NO GROWTH
Special Requests: ADEQUATE
Special Requests: ADEQUATE

## 2021-01-28 NOTE — Progress Notes (Signed)
Pt expired at [place]. Night shift RN completed post mortem checklist before shift change. All IV's removed and patient taken to the morgue.

## 2021-01-28 NOTE — Plan of Care (Signed)

## 2021-01-28 NOTE — Progress Notes (Signed)
Patient had desat episode earlier and RN increased FIO2 to 100% and notified this RT.  Went in to do early morning rounds and patient was at 100% on 100% FIO2; turned patient down to 90% FIO2 to try and wean some.  Will continue to monitor.

## 2021-01-28 DEATH — deceased

## 2021-01-29 DIAGNOSIS — Z515 Encounter for palliative care: Secondary | ICD-10-CM

## 2021-01-31 LAB — PTH-RELATED PEPTIDE: PTH-related peptide: <2 pmol/L

## 2021-02-27 NOTE — Death Summary Note (Signed)
DEATH SUMMARY   Patient Details  Name: Devin [name] MRN: [id number] DOB: 1945/11/23  Admission/Discharge Information   Admit Date:  02/10/2021  Date of Death: Date of Death: 2021-02-17  Time of Death: Time of Death: 0617  Length of Stay: 08-26-2022  Referring Physician: [name], MD   Reason(s) for Hospitalization   severe sepsis with acute hypoxic respiratory failure  secondary to community-acquired pneumonia (RUL)-- Patient met sepsis criteria on admission with tachycardia and tachypnea and finding of right upper lobe pneumonia    Diagnoses  Preliminary cause of death:  Secondary Diagnoses (including complications and co-morbidities):  Principal Problem:   Severe sepsis with acute organ dysfunction due to Rt Sided PNA Active Problems:   CAP (community acquired pneumonia)   Severe Thrombocytopenia ([place])   Acute HFrEF (heart failure with reduced ejection fraction) --EF 20 %   NSTEMI (non-ST elevated myocardial infarction) Type 2 Due to Sepsis   Acute respiratory failure with hypoxia due to CHF and pneumonia   Palliative care encounter   PAD (peripheral artery disease) ([place])   Acute metabolic encephalopathy   Hyperlipidemia   Hypertension   Hypernatremia   Hypercalcemia   Dehydration   Hypokalemia   Brief Hospital Course (including significant findings, care, treatment, and services provided and events leading to death)  Devin [name] is a 75 y.o. year old male with history of coronary artery disease, GERD, hypertension, stenosis of carotid artery, COPD, TIA , PAD  admitted on 10-Feb-2021 with severe sepsis secondary to community-acquired pneumonia and acute hypoxic resp failure and persistent severe  thrombocytopenia, as well as HFrEF/NSTEMI--- due to  type II NSTEMI due to demand ischemia in the setting of sepsis and pneumonia with acute respiratory failure and hypoxia--compounded by acute systolic dysfunction CHF exacerbation and AKI with electrolyte abnormalities    -please see full progress note dated 8/292022 for details of patient's hospital stay    Pertinent Labs and Studies  Significant Diagnostic Studies CT HEAD WO CONTRAST (5MM)  Result Date: 10-Feb-2021 CLINICAL DATA:  Mental status changes EXAM: CT HEAD WITHOUT CONTRAST TECHNIQUE: Contiguous axial images were obtained from the base of the skull through the vertex without intravenous contrast. COMPARISON:  11/27/2001 FINDINGS: Brain: There is atrophy and chronic small vessel disease changes. No acute intracranial abnormality. Specifically, no hemorrhage, hydrocephalus, mass lesion, acute infarction, or significant intracranial injury. Vascular: No hyperdense vessel or unexpected calcification. Skull: No acute calvarial abnormality. Sinuses/Orbits: No acute findings Other: None IMPRESSION: Atrophy, chronic microvascular disease. No acute intracranial abnormality. Electronically Signed   By: Rolm Baptise M.D.   On: February 10, 2021 18:58   MR BRAIN WO CONTRAST  Result Date: 01/21/2021 CLINICAL DATA:  Mental status change, unknown cause. EXAM: MRI HEAD WITHOUT CONTRAST TECHNIQUE: Multiplanar, multiecho pulse sequences of the brain and surrounding structures were obtained without intravenous contrast. COMPARISON:  Head CT 02-10-2021 FINDINGS: The study is partially degraded by motion. Brain: No acute infarction, hemorrhage, hydrocephalus, extra-axial collection or mass lesion. Scattered and confluent foci of T2 hyperintensity are seen within the white matter of the cerebral hemispheres nonspecific, most likely related to chronic small vessel ischemia. Moderate parenchymal volume loss. Vascular: Normal flow voids. Skull and upper cervical spine: Negative. Sinuses/Orbits: Bilateral lens surgery. Paranasal sinuses are clear. Other: None. IMPRESSION: 1. Study partially limited by motion. 2. No acute intracranial abnormality. 3. Moderate chronic ischemic changes of the white matter and parenchymal volume.  Electronically Signed   By: Pedro Earls M.D.   On: 01/21/2021  13:17   CT ABDOMEN PELVIS W CONTRAST  Result Date: 01/21/2021 CLINICAL DATA:  Acute abdominal pain, nonlocalized. Generalized abdominal pain, weakness, anemia, sepsis secondary to community-acquired pneumonia. EXAM: CT ABDOMEN AND PELVIS WITH CONTRAST TECHNIQUE: Multidetector CT imaging of the abdomen and pelvis was performed using the standard protocol following bolus administration of intravenous contrast. CONTRAST:  24m OMNIPAQUE IOHEXOL 350 MG/ML SOLN COMPARISON:  Chest radiograph 01/21/2021 FINDINGS: Lower chest: Airspace consolidation in the right lower lung consistent with pneumonia. Cardiac enlargement. Postoperative changes in the mediastinum. Hepatobiliary: Mild diffuse fatty infiltration of the liver. Poorly defined focal low-attenuation lesion in segment 7 of the liver measuring 1.2 cm diameter. There appears to be vague peripheral enhancement. While this may represent a hemangioma, the CT appearance is indeterminate. Consider further characterization with MRI in the nonemergent setting as clinically indicated. Cholelithiasis with small stone in the dependent gallbladder. No inflammatory changes. Bile ducts are not dilated. Pancreas: Unremarkable. No pancreatic ductal dilatation or surrounding inflammatory changes. Spleen: Normal in size without focal abnormality. Adrenals/Urinary Tract: Adrenal glands are unremarkable. Kidneys are normal, without renal calculi, focal lesion, or hydronephrosis. Bladder is unremarkable. Stomach/Bowel: Stomach, small bowel, and colon are not abnormally distended. Stool throughout the colon. A patent medication also demonstrated in the colon. No wall thickening or inflammatory changes appreciated. Appendix is not identified. Vascular/Lymphatic: Diffuse prominent calcification of the aorta and branch vessels. No aneurysm. Acute change in diameter of the distal common femoral artery on the  right with absence of flow distally suggesting thrombosis or occlusion. Suggestion of a femoral bypass graft which is also occluded. There is no retroperitoneal lymphadenopathy. There is evidence of lymphadenopathy in the porta hepatis, with enlarged lymph nodes in the hepatic hilum measuring 1.8 cm short axis dimension. Reproductive: Prostate is unremarkable. Other: No free air or free fluid in the abdomen. Abdominal wall musculature appears intact. Musculoskeletal: Degenerative changes in the spine. Old right rib fractures. IMPRESSION: 1. Airspace consolidation in the right lower lung consistent with pneumonia. 2. Indeterminate lesion in segment 7 of the liver. Consider further characterization with nonemergent MRI if clinically indicated. 3. Lymphadenopathy in the hepatic hilum with multiple lymph nodes measuring up to 1.8 cm short axis dimension. Nonspecific etiology. Malignant/metastatic versus reactive. 4. Absence of flow demonstrated in the distal common femoral artery extending into the superficial femoral artery on the right. Possible thrombosed bypass graft, incompletely visualized. Electronically Signed   By: WLucienne CapersM.D.   On: 01/21/2021 21:14   DG CHEST PORT 1 VIEW  Result Date: 01/25/2021 CLINICAL DATA:  75year old male with history of sepsis and dyspnea. Respiratory distress. EXAM: PORTABLE CHEST 1 VIEW COMPARISON:  Chest x-ray 01/24/2021. FINDINGS: Lung volumes are normal. Patchy areas of interstitial prominence, diffuse peribronchial cuffing, and patchy ill-defined opacities are again noted in the lungs bilaterally, most severe throughout the right lung. No pleural effusions. No pneumothorax. No evidence of pulmonary edema. Heart size is normal. Upper mediastinal contours are within normal limits. Atherosclerotic calcifications in the thoracic aorta. Status post median sternotomy for CABG. IMPRESSION: 1. Persistent multilobar bilateral (right greater than left) pneumonia, without  substantial change, as above. 2. Aortic atherosclerosis. Electronically Signed   By: DVinnie LangtonM.D.   On: 01/25/2021 07:48   DG CHEST PORT 1 VIEW  Result Date: 01/24/2021 CLINICAL DATA:  Dyspnea. EXAM: PORTABLE CHEST 1 VIEW COMPARISON:  January 23, 2021. FINDINGS: The heart size and mediastinal contours are within normal limits. Status post coronary bypass graft. Stable bilateral lung opacities are noted,  right greater than left, concerning for multifocal pneumonia. The visualized skeletal structures are unremarkable. IMPRESSION: Stable bilateral lung opacities are noted concerning for multifocal pneumonia. Aortic Atherosclerosis (ICD10-I70.0). Electronically Signed   By: Marijo Conception M.D.   On: 01/24/2021 13:34   DG Chest Port 1 View  Result Date: 01/23/2021 CLINICAL DATA:  Shortness of breath, right-sided pneumonia EXAM: PORTABLE CHEST 1 VIEW COMPARISON:  01/22/2021 FINDINGS: Multifocal patchy opacities, right upper lobe predominant, progressive. No pleural effusion or pneumothorax. The heart is top-normal in size. Postsurgical changes related to prior CABG. Thoracic aortic atherosclerosis. Median sternotomy. IMPRESSION: Multifocal patchy opacities, right upper lobe predominant, progressive. This appearance is compatible with worsening pneumonia. Electronically Signed   By: Julian Hy M.D.   On: 01/23/2021 03:56   DG CHEST PORT 1 VIEW  Result Date: 01/22/2021 CLINICAL DATA:  Pneumonia EXAM: PORTABLE CHEST 1 VIEW COMPARISON:  01/21/2021 FINDINGS: No significant change in AP portable chest radiograph with diffuse heterogeneous and interstitial airspace opacity, somewhat coarse appearing, and cardiomegaly status post median sternotomy. IMPRESSION: 1. No significant change in AP portable chest radiograph with diffuse heterogeneous and interstitial airspace opacity, somewhat coarse appearing, this may reflect a combination of acute airspace disease and chronic underlying interstitial change.  This could be further assessed by CT if desired. 2.  Cardiomegaly status post median sternotomy. Electronically Signed   By: Eddie Candle M.D.   On: 01/22/2021 07:59   DG CHEST PORT 1 VIEW  Result Date: 01/21/2021 CLINICAL DATA:  Altered level of consciousness, short of breath EXAM: PORTABLE CHEST 1 VIEW COMPARISON:  01/03/2021 FINDINGS: Single frontal view of the chest demonstrates a stable cardiac silhouette. There is chronic background interstitial scarring, with superimposed right perihilar airspace disease that has progressed since prior study. No effusion or pneumothorax. No acute bony abnormalities. IMPRESSION: 1. Increasing right perihilar airspace disease, consistent with acute pneumonia superimposed upon chronic background lung scarring. Electronically Signed   By: Randa Ngo M.D.   On: 01/21/2021 17:34   DG Chest Port 1 View  Result Date: 01/18/2021 CLINICAL DATA:  Cough. EXAM: PORTABLE CHEST 1 VIEW COMPARISON:  Chest x-ray dated July 14, 2018. FINDINGS: Stable cardiomediastinal silhouette status post CABG. Patchy opacities in the right upper lobe. No pleural effusion or pneumothorax. No acute osseous abnormality. IMPRESSION: 1. Right upper lobe pneumonia. Electronically Signed   By: Titus Dubin M.D.   On: 01/02/2021 18:10   ECHOCARDIOGRAM COMPLETE  Result Date: 01/22/2021    ECHOCARDIOGRAM REPORT   Patient Name:   Devin [name] Date of Exam: 01/22/2021 Medical Rec #:  [id number]          Height:       69.0 in Accession #:    [id number]         Weight:       147.9 lb Date of Birth:  06/18/1945           BSA:          1.817 m Patient Age:    10 years           BP:           146/81 mmHg Patient Gender: M                  HR:           109 bpm. Exam Location:  [facility] Procedure: 2D Echo, Cardiac Doppler and Color Doppler Indications:    Dyspnea  History:  Patient has prior history of Echocardiogram examinations, most                 recent 04/30/2018. CAD, Prior CABG, TIA,  Arrythmias:LBBB; Risk                 Factors:Hypertension and Dyslipidemia.  Sonographer:    Wenda Low Referring Phys: [place]  1. The anteroseptal, anterior, antoerlateral walls are akinetic. The entire apex is akinetic. Mid to distal inferior wall is akinetic. Marland Kitchen Left ventricular ejection fraction, by estimation, is 20%. The left ventricle has severely decreased function. The left ventricle demonstrates regional wall motion abnormalities (see scoring diagram/findings for description). Left ventricular diastolic parameters are indeterminate.  2. Right ventricular systolic function is normal. The right ventricular size is normal. There is moderately elevated pulmonary artery systolic pressure.  3. The mitral valve is normal in structure. Mild mitral valve regurgitation. No evidence of mitral stenosis.  4. The aortic valve is tricuspid. There is mild calcification of the aortic valve. There is mild thickening of the aortic valve. Aortic valve regurgitation is not visualized. No aortic stenosis is present.  5. The inferior vena cava is dilated in size with >50% respiratory variability, suggesting right atrial pressure of 8 mmHg. FINDINGS  Left Ventricle: The anteroseptal, anterior, antoerlateral walls are akinetic. The entire apex is akinetic. Mid to distal inferior wall is akinetic. Left ventricular ejection fraction, by estimation, is 20%. The left ventricle has severely decreased function. The left ventricle demonstrates regional wall motion abnormalities. The left ventricular internal cavity size was normal in size. There is no left ventricular hypertrophy. Left ventricular diastolic parameters are indeterminate. Right Ventricle: The right ventricular size is normal. Right vetricular wall thickness was not assessed. Right ventricular systolic function is normal. There is moderately elevated pulmonary artery systolic pressure. The tricuspid regurgitant velocity is  3.54 m/s, and  with an assumed right atrial pressure of 8 mmHg, the estimated right ventricular systolic pressure is 80.2 mmHg. Left Atrium: Left atrial size was normal in size. Right Atrium: Right atrial size was normal in size. Pericardium: There is no evidence of pericardial effusion. Mitral Valve: The mitral valve is normal in structure. Mild mitral valve regurgitation. No evidence of mitral valve stenosis. MV peak gradient, 2.5 mmHg. The mean mitral valve gradient is 1.0 mmHg. Tricuspid Valve: The tricuspid valve is not well visualized. Tricuspid valve regurgitation is mild . No evidence of tricuspid stenosis. Aortic Valve: The aortic valve is tricuspid. There is mild calcification of the aortic valve. There is mild thickening of the aortic valve. There is mild aortic valve annular calcification. Aortic valve regurgitation is not visualized. No aortic stenosis  is present. Aortic valve mean gradient measures 2.0 mmHg. Aortic valve peak gradient measures 4.0 mmHg. Aortic valve area, by VTI measures 1.80 cm. Pulmonic Valve: The pulmonic valve was not well visualized. Pulmonic valve regurgitation is not visualized. No evidence of pulmonic stenosis. Aorta: The aortic root is normal in size and structure. Venous: The inferior vena cava is dilated in size with greater than 50% respiratory variability, suggesting right atrial pressure of 8 mmHg. IAS/Shunts: No atrial level shunt detected by color flow Doppler.  LEFT VENTRICLE PLAX 2D LVIDd:         5.44 cm      Diastology LVIDs:         4.45 cm      LV e' medial:    3.81 cm/s LV PW:  1.03 cm      LV E/e' medial:  13.3 LV IVS:        1.05 cm      LV e' lateral:   6.64 cm/s LVOT diam:     2.00 cm      LV E/e' lateral: 7.6 LV SV:         29 LV SV Index:   16 LVOT Area:     3.14 cm  LV Volumes (MOD) LV vol d, MOD A2C: 84.6 ml LV vol d, MOD A4C: 106.0 ml LV vol s, MOD A2C: 60.0 ml LV vol s, MOD A4C: 74.1 ml LV SV MOD A2C:     24.6 ml LV SV MOD A4C:     106.0 ml LV SV MOD BP:       27.2 ml RIGHT VENTRICLE RV Basal diam:  3.36 cm RV Mid diam:    2.73 cm RV S prime:     8.33 cm/s TAPSE (M-mode): 2.2 cm LEFT ATRIUM             Index       RIGHT ATRIUM           Index LA diam:        4.80 cm 2.64 cm/m  RA Area:     14.20 cm LA Vol (A2C):   45.1 ml 24.81 ml/m RA Volume:   31.10 ml  17.11 ml/m LA Vol (A4C):   57.3 ml 31.53 ml/m LA Biplane Vol: 54.0 ml 29.71 ml/m  AORTIC VALVE AV Area (Vmax):    1.99 cm AV Area (Vmean):   1.72 cm AV Area (VTI):     1.80 cm AV Vmax:           99.70 cm/s AV Vmean:          63.100 cm/s AV VTI:            0.162 m AV Peak Grad:      4.0 mmHg AV Mean Grad:      2.0 mmHg LVOT Vmax:         63.30 cm/s LVOT Vmean:        34.600 cm/s LVOT VTI:          0.093 m LVOT/AV VTI ratio: 0.57  AORTA Ao Root diam: 3.30 cm Ao Asc diam:  3.70 cm MITRAL VALVE               TRICUSPID VALVE MV Area (PHT): 4.21 cm    TR Peak grad:   50.1 mmHg MV Area VTI:   1.84 cm    TR Vmax:        354.00 cm/s MV Peak grad:  2.5 mmHg MV Mean grad:  1.0 mmHg    SHUNTS MV Vmax:       0.80 m/s    Systemic VTI:  0.09 m MV Vmean:      52.4 cm/s   Systemic Diam: 2.00 cm MV Decel Time: 180 msec MV E velocity: 50.60 cm/s MV A velocity: 58.30 cm/s MV E/A ratio:  0.87 Carlyle Dolly MD Electronically signed by Carlyle Dolly MD Signature Date/Time: 01/22/2021/3:52:57 PM    Final     Microbiology Recent Results (from the past 240 hour(s))  Blood culture (routine x 2)     Status: None   Collection Time: 01/04/2021  6:35 PM   Specimen: BLOOD RIGHT HAND  Result Value Ref Range Status   Specimen Description BLOOD RIGHT HAND  Final   Special Requests  Final    BOTTLES DRAWN AEROBIC AND ANAEROBIC Blood Culture adequate volume   Culture   Final    NO GROWTH 5 DAYS Performed at [facility], [address], [place], [place] [address]    Report Status 01/24/2021 FINAL  Final  Blood culture (routine x 2)     Status: None   Collection Time: 01/27/2021  6:35 PM   Specimen: BLOOD LEFT HAND  Result Value  Ref Range Status   Specimen Description BLOOD LEFT HAND  Final   Special Requests   Final    BOTTLES DRAWN AEROBIC AND ANAEROBIC Blood Culture adequate volume   Culture   Final    NO GROWTH 5 DAYS Performed at [facility], [address] [place], [place] [address]    Report Status 01/24/2021 FINAL  Final  Resp Panel by RT-PCR (Flu A&B, Covid) Nasopharyngeal Swab     Status: None   Collection Time: 01/27/2021  6:37 PM   Specimen: Nasopharyngeal Swab; Nasopharyngeal(NP) swabs in vial transport medium  Result Value Ref Range Status   SARS Coronavirus 2 by RT PCR NEGATIVE NEGATIVE Final    Comment: (NOTE) SARS-CoV-2 target nucleic acids are NOT DETECTED.  The SARS-CoV-2 RNA is generally detectable in upper respiratory specimens during the acute phase of infection. The lowest concentration of SARS-CoV-2 viral copies this assay can detect is 138 copies/mL. A negative result does not preclude SARS-Cov-2 infection and should not be used as the sole basis for treatment or other patient management decisions. A negative result may occur with  improper specimen collection/handling, submission of specimen other than nasopharyngeal swab, presence of viral mutation(s) within the areas targeted by this assay, and inadequate number of viral copies(<138 copies/mL). A negative result must be combined with clinical observations, patient history, and epidemiological information. The expected result is Negative.  Fact Sheet for Patients:  EntrepreneurPulse.com.au  Fact Sheet for Healthcare Providers:  IncredibleEmployment.be  This test is no t yet approved or cleared by the [place] FDA and  has been authorized for detection and/or diagnosis of SARS-CoV-2 by FDA under an Emergency Use Authorization (EUA). This EUA will remain  in effect (meaning this test can be used) for the duration of the COVID-19 declaration under Section 564(b)(1) of the Act,  21 U.S.C.section 360bbb-3(b)(1), unless the authorization is terminated  or revoked sooner.       Influenza A by PCR NEGATIVE NEGATIVE Final   Influenza B by PCR NEGATIVE NEGATIVE Final    Comment: (NOTE) The Xpert Xpress SARS-CoV-2/FLU/RSV plus assay is intended as an aid in the diagnosis of influenza from Nasopharyngeal swab specimens and should not be used as a sole basis for treatment. Nasal washings and aspirates are unacceptable for Xpert Xpress SARS-CoV-2/FLU/RSV testing.  Fact Sheet for Patients: EntrepreneurPulse.com.au  Fact Sheet for Healthcare Providers: IncredibleEmployment.be  This test is not yet approved or cleared by the [place] FDA and has been authorized for detection and/or diagnosis of SARS-CoV-2 by FDA under an Emergency Use Authorization (EUA). This EUA will remain in effect (meaning this test can be used) for the duration of the COVID-19 declaration under Section 564(b)(1) of the Act, 21 U.S.C. section 360bbb-3(b)(1), unless the authorization is terminated or revoked.  Performed at [facility], [address], [place], [place] [address]   MRSA Next Gen by PCR, Nasal     Status: None   Collection Time: 01/21/21  9:13 PM   Specimen: Nasal Mucosa; Nasal Swab  Result Value Ref Range Status  MRSA by PCR Next Gen NOT DETECTED NOT DETECTED Final    Comment: (NOTE) The GeneXpert MRSA Assay (FDA approved for NASAL specimens only), is one component of a comprehensive MRSA colonization surveillance program. It is not intended to diagnose MRSA infection nor to guide or monitor treatment for MRSA infections. Test performance is not FDA approved in patients less than 60 years old. Performed at [facility], [address] [place], [place] [address]   Urine Culture     Status: None   Collection Time: 01/22/21  5:42 AM   Specimen: Urine, Clean Catch  Result Value Ref Range Status   Specimen Description   Final     URINE, CLEAN CATCH Performed at [facility], [address] [place], [place] [address]    Special Requests   Final    NONE Performed at [facility], [address], [place], [place] [address]    Culture   Final    NO GROWTH Performed at [facility], [place] [address], [place], [place] [address]    Report Status 01/24/2021 FINAL  Final  Culture, blood (Routine X 2) w Reflex to ID Panel     Status: None   Collection Time: 01/23/21  8:02 PM   Specimen: BLOOD RIGHT HAND  Result Value Ref Range Status   Specimen Description BLOOD RIGHT HAND  Final   Special Requests   Final    BOTTLES DRAWN AEROBIC AND ANAEROBIC Blood Culture adequate volume   Culture   Final    NO GROWTH 5 DAYS Performed at [facility], [address], [place], [place] [address]    Report Status 01/28/2021 FINAL  Final  Culture, blood (Routine X 2) w Reflex to ID Panel     Status: None   Collection Time: 01/23/21  8:02 PM   Specimen: BLOOD LEFT HAND  Result Value Ref Range Status   Specimen Description BLOOD LEFT HAND  Final   Special Requests   Final    BOTTLES DRAWN AEROBIC AND ANAEROBIC Blood Culture adequate volume   Culture   Final    NO GROWTH 5 DAYS Performed at [facility], [address] [place], [place] [address]    Report Status 01/28/2021 FINAL  Final    Lab Basic Metabolic Panel: Recent Labs  Lab 01/23/21 0408 01/24/21 1103 01/25/21 0421 Feb 06, 2021 0429  NA 137 145 150* 150*  K 4.3 4.5 5.1 5.8*  CL 109 112* 115* 113*  CO2 21* 26 27 33*  GLUCOSE 126* 139* 167* 185*  BUN 39* 76* 84* 97*  CREATININE 1.11 1.55* 1.41* 1.65*  CALCIUM 9.6 10.0 10.0 10.2  PHOS  --  5.2* 5.0*  --    Liver Function Tests: Recent Labs  Lab 01/23/21 0408 01/24/21 1103 01/25/21 0421 Feb 06, 2021 0429  AST 107*  --   --  103*  ALT 38  --   --  53*  ALKPHOS 163*  --   --  211*  BILITOT 0.8  --   --  0.7  PROT 9.4*  --   --  7.8  ALBUMIN 2.6* 2.7* 2.6* 2.6*   No results for input(s): LIPASE, AMYLASE in  the last 168 hours. No results for input(s): AMMONIA in the last 168 hours. CBC: Recent Labs  Lab 01/23/21 0408 01/24/21 1103 01/25/21 0420 2021/02/06 0429  WBC 11.5* 11.8* 13.3* 16.2*  HGB 11.7* 12.2* 12.8* 11.5*  HCT 36.8* 39.6 44.2 40.3  MCV 97.6 101.8* 106.3* 109.8*  PLT 31* 60* 47*  67*  69*   Cardiac Enzymes: No results for input(s): CKTOTAL, CKMB, CKMBINDEX, TROPONINI in the last 168 hours. Sepsis Labs: Recent Labs  Lab 01/23/21 0408 01/24/21 1103 01/25/21 0420 02/12/2021 0429  WBC 11.5* 11.8* 13.3* 16.2*    Procedures/Operations     Auburn Hester 01/29/2021, 10:33 AM

## 2021-02-27 NOTE — Discharge Summary (Signed)
   75 y.o. year old male with history of coronary artery disease, GERD, hypertension, stenosis of carotid artery, COPD, TIA , PAD  admitted on 01/24/2021 with severe sepsis secondary to community-acquired pneumonia and acute hypoxic resp failure and persistent severe  thrombocytopenia, as well as HFrEF/NSTEMI--- due to  type II NSTEMI due to demand ischemia in the setting of sepsis and pneumonia with acute respiratory failure and hypoxia--compounded by acute systolic dysfunction CHF exacerbation and AKI with electrolyte abnormalities   -please see full progress note dated [id number] for details of patient's hospital stay   Please see death summary from 12-Feb-2021

## 2021-04-13 ENCOUNTER — Ambulatory Visit: Payer: Medicare Other | Admitting: Cardiovascular Disease

## 2021-08-24 ENCOUNTER — Ambulatory Visit: Payer: Medicare Other | Admitting: Cardiovascular Disease

## 2023-02-22 IMAGING — DX DG CHEST 1V PORT
1 series · 1 of 1 positions shown · non-contrast
Comparison: 01/22/2021

CLINICAL DATA: Shortness of breath, right-sided pneumonia

EXAM:
PORTABLE CHEST 1 VIEW

[chest ap]
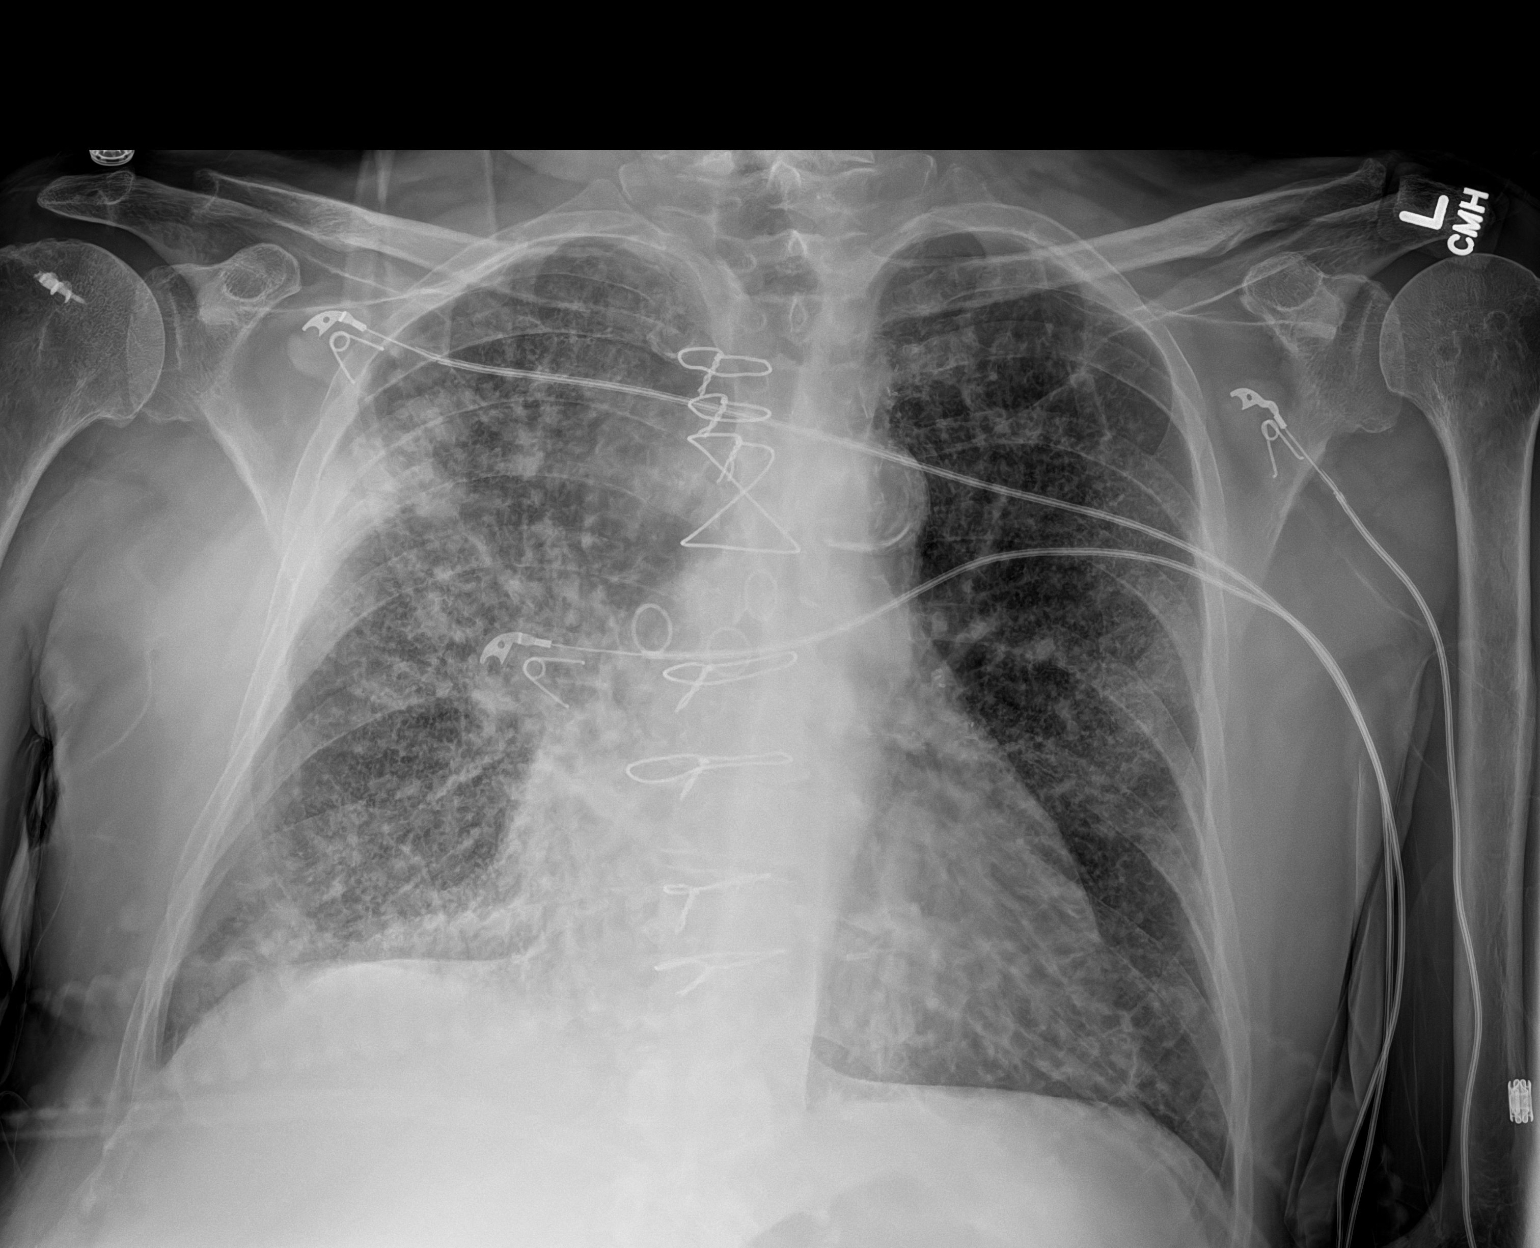

[1 of 1 positions shown; findings below may reference images not displayed]

FINDINGS: Multifocal patchy opacities, right upper lobe predominant,
progressive. No pleural effusion or pneumothorax.

The heart is top-normal in size. Postsurgical changes related to
prior CABG. Thoracic aortic atherosclerosis.

Median sternotomy.
IMPRESSION: Multifocal patchy opacities, right upper lobe predominant,
progressive. This appearance is compatible with worsening pneumonia.

## 2023-02-23 IMAGING — DX DG CHEST 1V PORT
1 series · 1 of 1 positions shown · non-contrast
Comparison: January 23, 2021.

CLINICAL DATA: Dyspnea.

EXAM:
PORTABLE CHEST 1 VIEW

[chest ap]
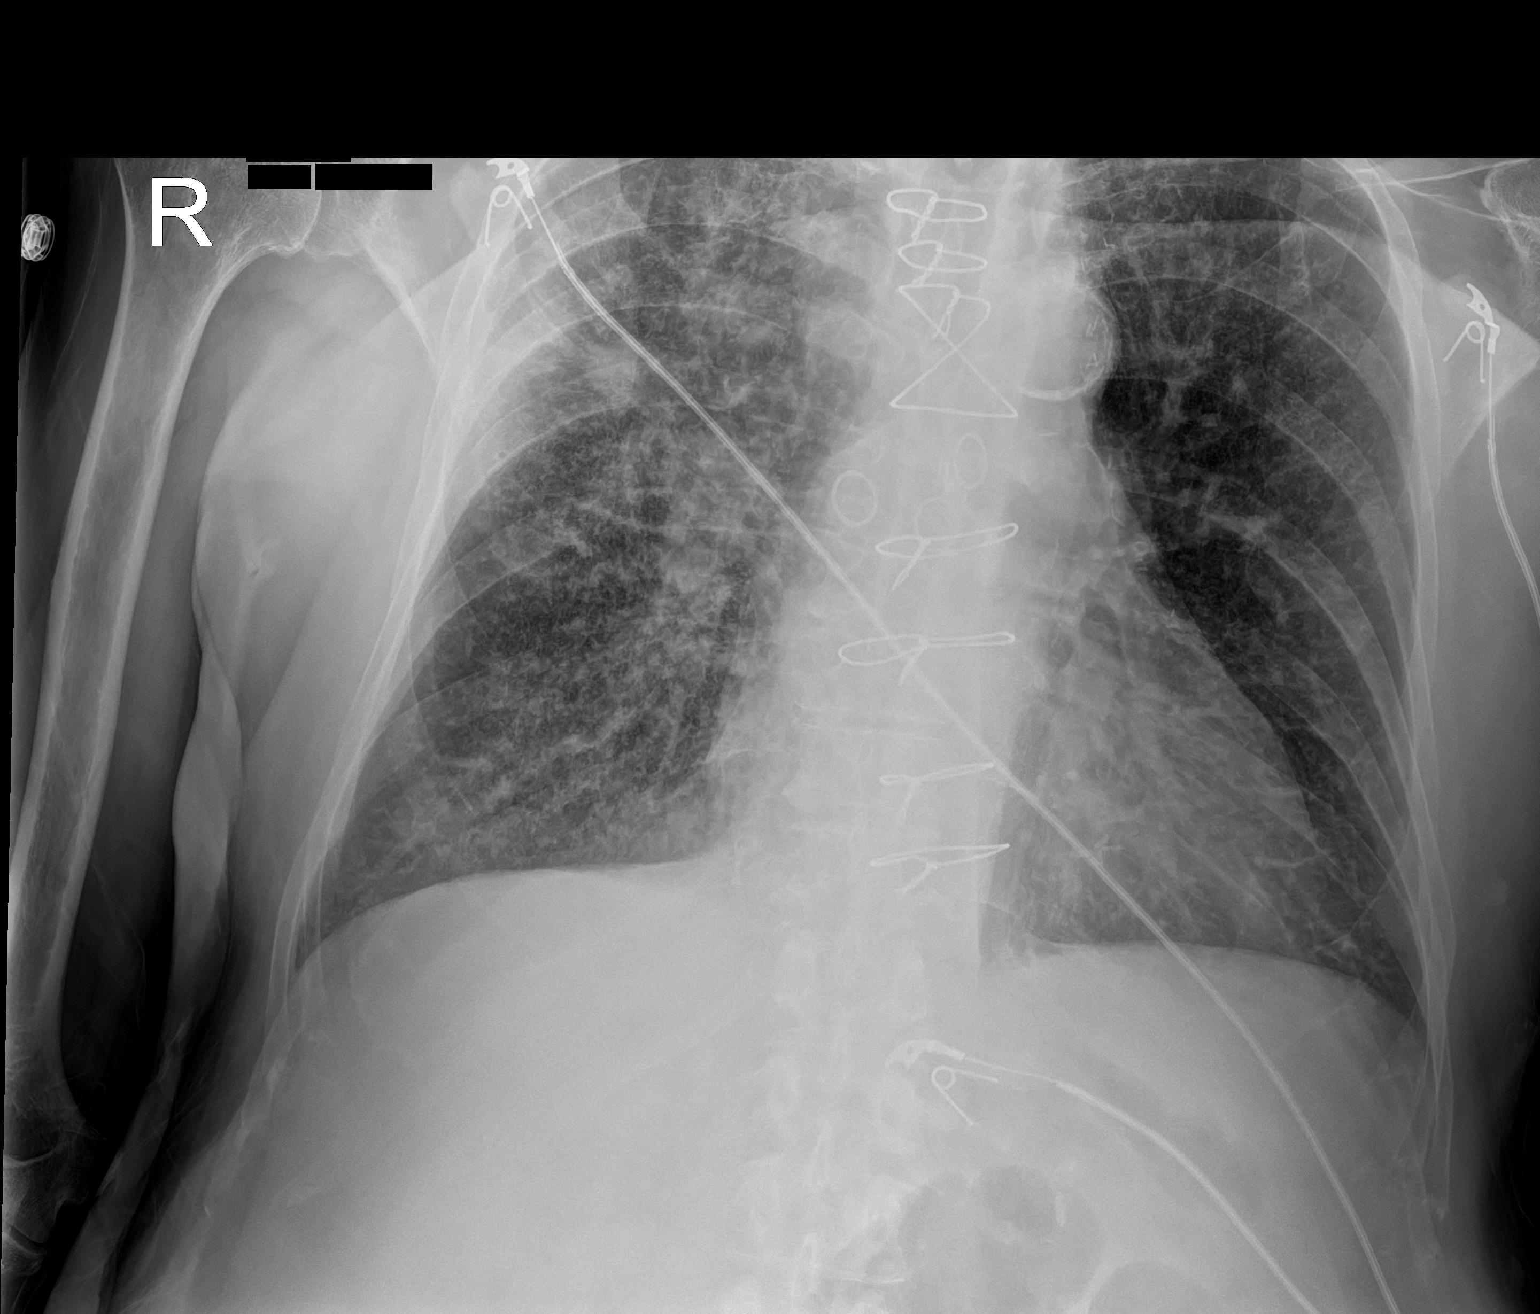

[1 of 1 positions shown; findings below may reference images not displayed]

FINDINGS: The heart size and mediastinal contours are within normal limits.
Status post coronary bypass graft. Stable bilateral lung opacities
are noted, right greater than left, concerning for multifocal
pneumonia. The visualized skeletal structures are unremarkable.
IMPRESSION: Stable bilateral lung opacities are noted concerning for multifocal
pneumonia.

Aortic Atherosclerosis ([id number]).

## 2023-02-24 IMAGING — DX DG CHEST 1V PORT
1 series · 1 of 1 positions shown · non-contrast
Comparison: Chest x-ray 01/24/2021.

CLINICAL DATA: 75-year-old male with history of sepsis and dyspnea.
Respiratory distress.

EXAM:
PORTABLE CHEST 1 VIEW

[chest ap]
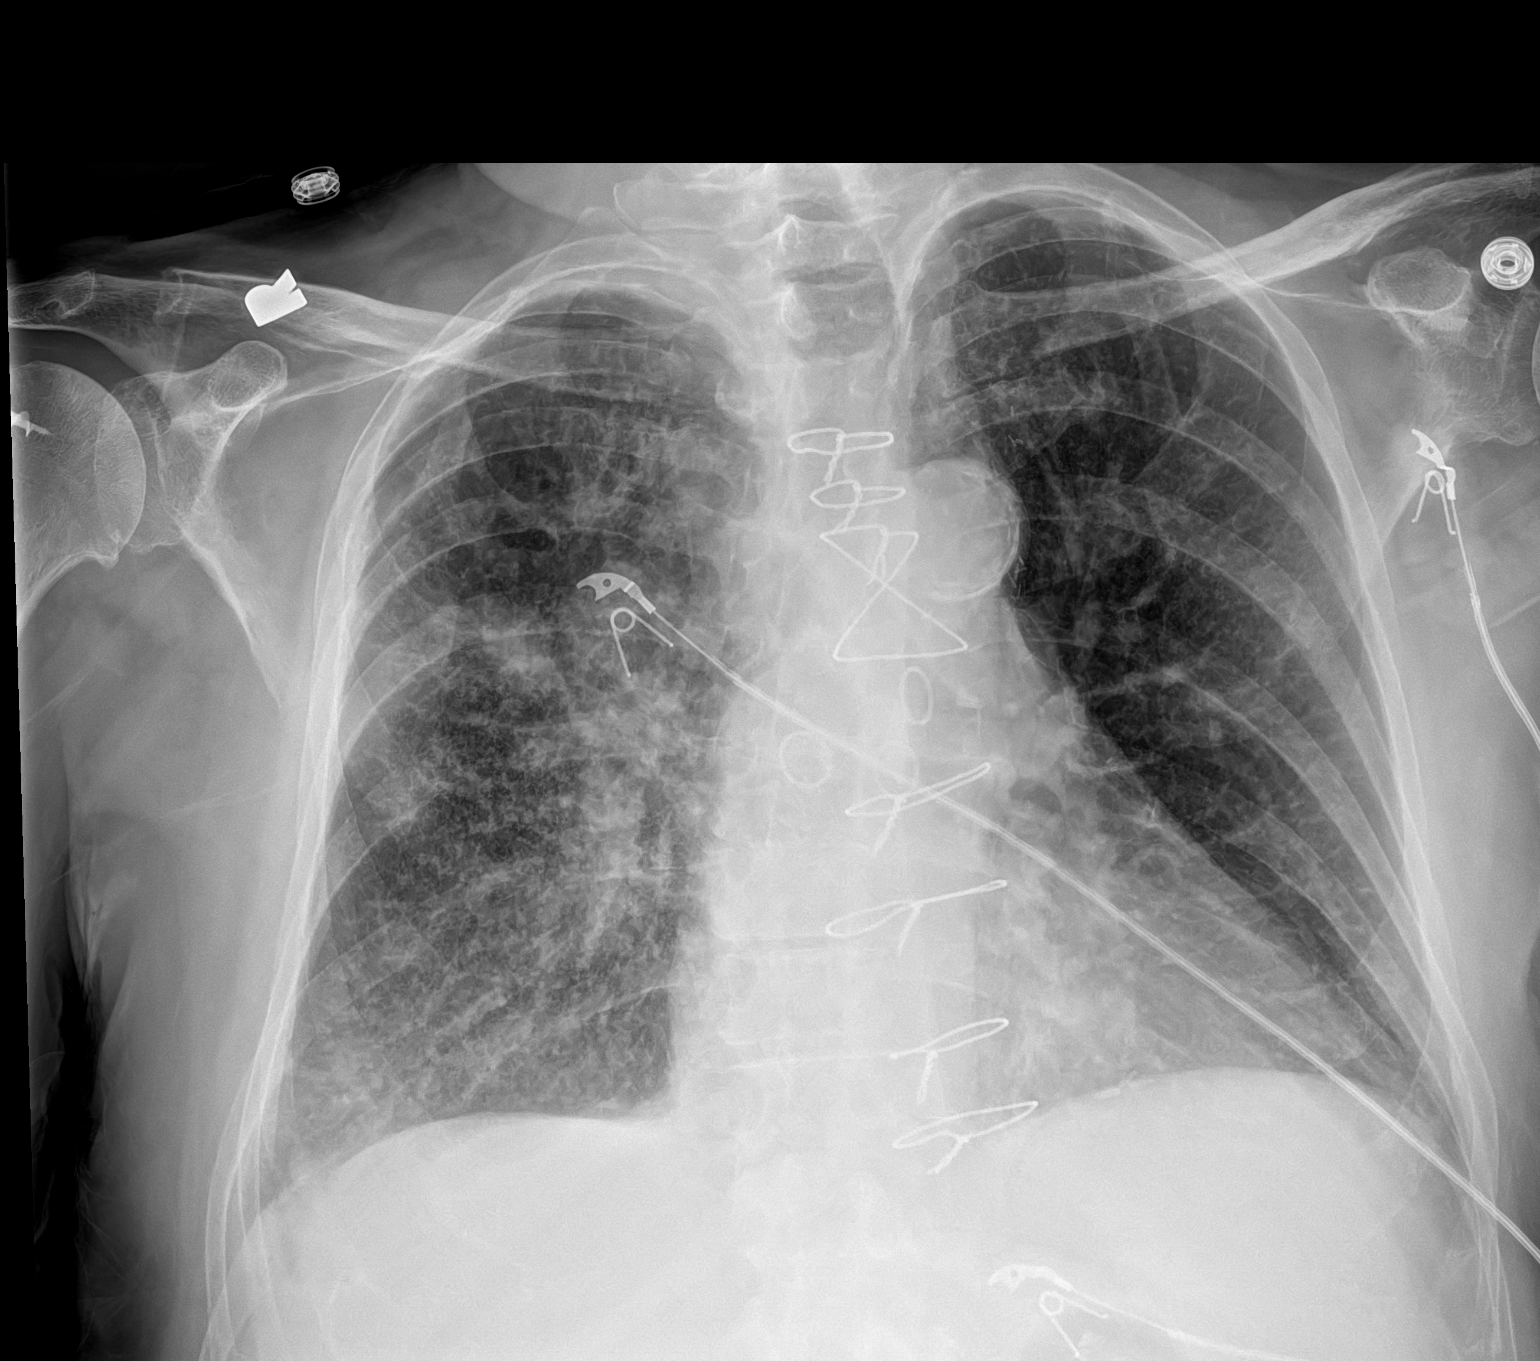

[1 of 1 positions shown; findings below may reference images not displayed]

FINDINGS: Lung volumes are normal. Patchy areas of interstitial prominence,
diffuse peribronchial cuffing, and patchy ill-defined opacities are
again noted in the lungs bilaterally, most severe throughout the
right lung. No pleural effusions. No pneumothorax. No evidence of
pulmonary edema. Heart size is normal. Upper mediastinal contours
are within normal limits. Atherosclerotic calcifications in the
thoracic aorta. Status post median sternotomy for CABG.
IMPRESSION: 1. Persistent multilobar bilateral (right greater than left)
pneumonia, without substantial change, as above.
2. Aortic atherosclerosis.
